# Patient Record
Sex: Male | Born: 1935
Health system: Southern US, Community
[De-identification: ages and names within clinical notes are randomized; demographics above are authoritative.]

## PROBLEM LIST (undated history)

## (undated) DIAGNOSIS — I81 Portal vein thrombosis: Secondary | ICD-10-CM

## (undated) DIAGNOSIS — K635 Polyp of colon: Secondary | ICD-10-CM

## (undated) DIAGNOSIS — J45909 Unspecified asthma, uncomplicated: Secondary | ICD-10-CM

## (undated) DIAGNOSIS — D6851 Activated protein C resistance: Secondary | ICD-10-CM

## (undated) DIAGNOSIS — F419 Anxiety disorder, unspecified: Secondary | ICD-10-CM

## (undated) DIAGNOSIS — K2270 Barrett's esophagus without dysplasia: Secondary | ICD-10-CM

## (undated) DIAGNOSIS — K529 Noninfective gastroenteritis and colitis, unspecified: Secondary | ICD-10-CM

## (undated) DIAGNOSIS — K5790 Diverticulosis of intestine, part unspecified, without perforation or abscess without bleeding: Secondary | ICD-10-CM

## (undated) DIAGNOSIS — G250 Essential tremor: Secondary | ICD-10-CM

## (undated) DIAGNOSIS — D682 Hereditary deficiency of other clotting factors: Secondary | ICD-10-CM

## (undated) DIAGNOSIS — M5430 Sciatica, unspecified side: Secondary | ICD-10-CM

## (undated) DIAGNOSIS — K449 Diaphragmatic hernia without obstruction or gangrene: Secondary | ICD-10-CM

## (undated) DIAGNOSIS — B029 Zoster without complications: Secondary | ICD-10-CM

## (undated) DIAGNOSIS — I82409 Acute embolism and thrombosis of unspecified deep veins of unspecified lower extremity: Secondary | ICD-10-CM

## (undated) DIAGNOSIS — G4700 Insomnia, unspecified: Secondary | ICD-10-CM

## (undated) DIAGNOSIS — K429 Umbilical hernia without obstruction or gangrene: Secondary | ICD-10-CM

## (undated) DIAGNOSIS — K219 Gastro-esophageal reflux disease without esophagitis: Secondary | ICD-10-CM

## (undated) DIAGNOSIS — I10 Essential (primary) hypertension: Secondary | ICD-10-CM

## (undated) DIAGNOSIS — M1990 Unspecified osteoarthritis, unspecified site: Secondary | ICD-10-CM

## (undated) DIAGNOSIS — M810 Age-related osteoporosis without current pathological fracture: Secondary | ICD-10-CM

## (undated) DIAGNOSIS — D689 Coagulation defect, unspecified: Secondary | ICD-10-CM

## (undated) DIAGNOSIS — R809 Proteinuria, unspecified: Secondary | ICD-10-CM

## (undated) DIAGNOSIS — Z86010 Personal history of colonic polyps: Secondary | ICD-10-CM

## (undated) DIAGNOSIS — E785 Hyperlipidemia, unspecified: Secondary | ICD-10-CM

## (undated) DIAGNOSIS — Z8719 Personal history of other diseases of the digestive system: Secondary | ICD-10-CM

## (undated) DIAGNOSIS — F329 Major depressive disorder, single episode, unspecified: Secondary | ICD-10-CM

## (undated) HISTORY — DX: Personal history of other diseases of the digestive system: Z87.19

## (undated) HISTORY — DX: Acute embolism and thrombosis of unspecified deep veins of unspecified lower extremity: I82.409

## (undated) HISTORY — DX: Umbilical hernia without obstruction or gangrene: K42.9

## (undated) HISTORY — DX: Gilbert syndrome: E80.4

## (undated) HISTORY — DX: Proteinuria, unspecified: R80.9

## (undated) HISTORY — DX: Personal history of colonic polyps: Z86.010

## (undated) HISTORY — DX: Hereditary deficiency of other clotting factors: D68.2

## (undated) HISTORY — PX: COLONOSCOPY: SHX174

## (undated) HISTORY — DX: Anxiety disorder, unspecified: F41.9

## (undated) HISTORY — DX: Unspecified asthma, uncomplicated: J45.909

## (undated) HISTORY — PX: TONSILLECTOMY: SHX5217

## (undated) HISTORY — DX: Barrett's esophagus without dysplasia: K22.70

## (undated) HISTORY — DX: Activated protein C resistance: D68.51

## (undated) HISTORY — DX: Insomnia, unspecified: G47.00

## (undated) HISTORY — DX: Diaphragmatic hernia without obstruction or gangrene: K44.9

## (undated) HISTORY — DX: Age-related osteoporosis without current pathological fracture: M81.0

## (undated) HISTORY — DX: Noninfective gastroenteritis and colitis, unspecified: K52.9

## (undated) HISTORY — DX: Polyp of colon: K63.5

## (undated) HISTORY — DX: Zoster without complications: B02.9

## (undated) HISTORY — DX: Portal vein thrombosis: I81

## (undated) HISTORY — DX: Diverticulosis of intestine, part unspecified, without perforation or abscess without bleeding: K57.90

## (undated) HISTORY — PX: KNEE ARTHROSCOPY: SUR90

## (undated) HISTORY — DX: Hyperlipidemia, unspecified: E78.5

## (undated) HISTORY — DX: Essential (primary) hypertension: I10

## (undated) HISTORY — DX: Major depressive disorder, single episode, unspecified: F32.9

## (undated) HISTORY — PX: CYSTOSCOPY: SUR368

## (undated) HISTORY — DX: Sciatica, unspecified side: M54.30

## (undated) HISTORY — DX: Essential tremor: G25.0

## (undated) HISTORY — DX: Gastro-esophageal reflux disease without esophagitis: K21.9

## (undated) HISTORY — DX: Unspecified osteoarthritis, unspecified site: M19.90

## (undated) HISTORY — PX: TRANSURETHRAL RESECTION OF PROSTATE: SHX73

## (undated) HISTORY — DX: Coagulation defect, unspecified: D68.9

---

## 1999-04-01 ENCOUNTER — Ambulatory Visit (HOSPITAL_BASED_OUTPATIENT_CLINIC_OR_DEPARTMENT_OTHER): Admission: RE | Admit: 1999-04-01 | Discharge: 1999-04-01 | Payer: Self-pay | Admitting: Urology

## 2004-08-18 DIAGNOSIS — F32A Depression, unspecified: Secondary | ICD-10-CM

## 2004-08-18 HISTORY — DX: Depression, unspecified: F32.A

## 2004-08-23 ENCOUNTER — Ambulatory Visit: Payer: Self-pay | Admitting: Internal Medicine

## 2004-10-04 ENCOUNTER — Ambulatory Visit: Payer: Self-pay | Admitting: Internal Medicine

## 2005-01-16 ENCOUNTER — Ambulatory Visit: Payer: Self-pay | Admitting: Internal Medicine

## 2005-01-23 ENCOUNTER — Ambulatory Visit: Payer: Self-pay

## 2005-01-27 ENCOUNTER — Ambulatory Visit: Payer: Self-pay | Admitting: Internal Medicine

## 2005-03-10 ENCOUNTER — Encounter: Admission: RE | Admit: 2005-03-10 | Discharge: 2005-06-08 | Payer: Self-pay | Admitting: Internal Medicine

## 2005-03-25 ENCOUNTER — Ambulatory Visit: Payer: Self-pay | Admitting: Internal Medicine

## 2005-03-26 ENCOUNTER — Inpatient Hospital Stay (HOSPITAL_COMMUNITY): Admission: EM | Admit: 2005-03-26 | Discharge: 2005-03-28 | Payer: Self-pay | Admitting: Emergency Medicine

## 2005-03-28 ENCOUNTER — Ambulatory Visit: Payer: Self-pay | Admitting: Psychiatry

## 2005-03-28 ENCOUNTER — Inpatient Hospital Stay (HOSPITAL_COMMUNITY): Admission: RE | Admit: 2005-03-28 | Discharge: 2005-03-31 | Payer: Self-pay | Admitting: Psychiatry

## 2005-04-16 ENCOUNTER — Ambulatory Visit: Payer: Self-pay | Admitting: Internal Medicine

## 2005-09-26 ENCOUNTER — Ambulatory Visit: Payer: Self-pay | Admitting: Internal Medicine

## 2005-10-01 ENCOUNTER — Ambulatory Visit: Payer: Self-pay | Admitting: Internal Medicine

## 2006-01-16 ENCOUNTER — Ambulatory Visit: Payer: Self-pay | Admitting: Internal Medicine

## 2006-08-28 ENCOUNTER — Ambulatory Visit (HOSPITAL_COMMUNITY): Admission: RE | Admit: 2006-08-28 | Discharge: 2006-08-28 | Payer: Self-pay | Admitting: Orthopedic Surgery

## 2007-04-12 ENCOUNTER — Encounter (INDEPENDENT_AMBULATORY_CARE_PROVIDER_SITE_OTHER): Payer: Self-pay | Admitting: *Deleted

## 2007-04-21 ENCOUNTER — Ambulatory Visit: Payer: Self-pay | Admitting: Internal Medicine

## 2007-04-21 LAB — CONVERTED CEMR LAB
HDL goal, serum: 40 mg/dL
LDL Goal: 160 mg/dL

## 2007-04-30 ENCOUNTER — Ambulatory Visit: Payer: Self-pay | Admitting: Gastroenterology

## 2007-05-28 ENCOUNTER — Encounter: Payer: Self-pay | Admitting: Gastroenterology

## 2007-05-28 ENCOUNTER — Ambulatory Visit: Payer: Self-pay | Admitting: Gastroenterology

## 2007-05-28 ENCOUNTER — Encounter: Payer: Self-pay | Admitting: Internal Medicine

## 2007-05-28 DIAGNOSIS — Z860100 Personal history of colon polyps, unspecified: Secondary | ICD-10-CM

## 2007-05-28 HISTORY — DX: Personal history of colon polyps, unspecified: Z86.0100

## 2007-05-28 HISTORY — DX: Personal history of colonic polyps: Z86.010

## 2007-06-28 ENCOUNTER — Encounter: Payer: Self-pay | Admitting: Internal Medicine

## 2007-09-14 ENCOUNTER — Encounter (INDEPENDENT_AMBULATORY_CARE_PROVIDER_SITE_OTHER): Payer: Self-pay | Admitting: *Deleted

## 2008-08-18 DIAGNOSIS — B029 Zoster without complications: Secondary | ICD-10-CM

## 2008-08-18 DIAGNOSIS — Z8719 Personal history of other diseases of the digestive system: Secondary | ICD-10-CM

## 2008-08-18 HISTORY — DX: Zoster without complications: B02.9

## 2008-08-18 HISTORY — DX: Personal history of other diseases of the digestive system: Z87.19

## 2008-09-19 ENCOUNTER — Ambulatory Visit: Payer: Self-pay | Admitting: Internal Medicine

## 2008-10-03 ENCOUNTER — Ambulatory Visit: Payer: Self-pay | Admitting: Internal Medicine

## 2008-10-03 DIAGNOSIS — Z86718 Personal history of other venous thrombosis and embolism: Secondary | ICD-10-CM | POA: Insufficient documentation

## 2008-10-03 DIAGNOSIS — I10 Essential (primary) hypertension: Secondary | ICD-10-CM

## 2008-10-03 DIAGNOSIS — R7301 Impaired fasting glucose: Secondary | ICD-10-CM | POA: Insufficient documentation

## 2008-10-03 DIAGNOSIS — E785 Hyperlipidemia, unspecified: Secondary | ICD-10-CM | POA: Insufficient documentation

## 2008-10-03 DIAGNOSIS — K219 Gastro-esophageal reflux disease without esophagitis: Secondary | ICD-10-CM

## 2008-10-04 ENCOUNTER — Ambulatory Visit: Payer: Self-pay | Admitting: Internal Medicine

## 2008-10-09 ENCOUNTER — Encounter (INDEPENDENT_AMBULATORY_CARE_PROVIDER_SITE_OTHER): Payer: Self-pay | Admitting: *Deleted

## 2009-02-06 ENCOUNTER — Ambulatory Visit: Payer: Self-pay | Admitting: Internal Medicine

## 2009-02-15 ENCOUNTER — Encounter (INDEPENDENT_AMBULATORY_CARE_PROVIDER_SITE_OTHER): Payer: Self-pay | Admitting: *Deleted

## 2009-02-15 LAB — CONVERTED CEMR LAB
ALT: 95 units/L — ABNORMAL HIGH (ref 0–53)
AST: 64 units/L — ABNORMAL HIGH (ref 0–37)
Albumin: 3.8 g/dL (ref 3.5–5.2)
Alkaline Phosphatase: 44 units/L (ref 39–117)
Basophils Absolute: 0 10*3/uL (ref 0.0–0.1)
Basophils Relative: 1 % (ref 0.0–3.0)
Bilirubin, Direct: 0.3 mg/dL (ref 0.0–0.3)
Cholesterol: 203 mg/dL — ABNORMAL HIGH (ref 0–200)
Eosinophils Absolute: 0.1 10*3/uL (ref 0.0–0.7)
MCHC: 35.2 g/dL (ref 30.0–36.0)
MCV: 100.6 fL — ABNORMAL HIGH (ref 78.0–100.0)
Monocytes Absolute: 0.4 10*3/uL (ref 0.1–1.0)
Neutrophils Relative %: 42.8 % — ABNORMAL LOW (ref 43.0–77.0)
Platelets: 91 10*3/uL — ABNORMAL LOW (ref 150.0–400.0)
RBC: 4.3 M/uL (ref 4.22–5.81)
RDW: 12 % (ref 11.5–14.6)
Total Protein: 7.2 g/dL (ref 6.0–8.3)

## 2009-04-09 ENCOUNTER — Ambulatory Visit: Payer: Self-pay | Admitting: Internal Medicine

## 2009-04-09 ENCOUNTER — Encounter: Payer: Self-pay | Admitting: Internal Medicine

## 2009-04-09 ENCOUNTER — Inpatient Hospital Stay (HOSPITAL_COMMUNITY): Admission: AD | Admit: 2009-04-09 | Discharge: 2009-04-13 | Payer: Self-pay | Admitting: Internal Medicine

## 2009-04-09 DIAGNOSIS — R1084 Generalized abdominal pain: Secondary | ICD-10-CM | POA: Insufficient documentation

## 2009-04-12 ENCOUNTER — Ambulatory Visit: Payer: Self-pay | Admitting: Oncology

## 2009-04-12 ENCOUNTER — Encounter: Payer: Self-pay | Admitting: Internal Medicine

## 2009-04-13 ENCOUNTER — Telehealth (INDEPENDENT_AMBULATORY_CARE_PROVIDER_SITE_OTHER): Payer: Self-pay | Admitting: *Deleted

## 2009-04-16 ENCOUNTER — Ambulatory Visit: Payer: Self-pay | Admitting: Internal Medicine

## 2009-04-16 DIAGNOSIS — D689 Coagulation defect, unspecified: Secondary | ICD-10-CM

## 2009-04-16 LAB — CONVERTED CEMR LAB: INR: 2.8

## 2009-04-19 ENCOUNTER — Ambulatory Visit: Payer: Self-pay | Admitting: Oncology

## 2009-04-24 ENCOUNTER — Ambulatory Visit: Payer: Self-pay | Admitting: Internal Medicine

## 2009-04-24 DIAGNOSIS — G250 Essential tremor: Secondary | ICD-10-CM

## 2009-04-24 LAB — CONVERTED CEMR LAB: INR: 2.3

## 2009-04-30 ENCOUNTER — Ambulatory Visit: Payer: Self-pay | Admitting: Internal Medicine

## 2009-05-02 ENCOUNTER — Encounter: Payer: Self-pay | Admitting: Internal Medicine

## 2009-05-02 ENCOUNTER — Encounter: Payer: Self-pay | Admitting: Gastroenterology

## 2009-05-02 LAB — CBC WITH DIFFERENTIAL/PLATELET
Basophils Absolute: 0 10*3/uL (ref 0.0–0.1)
Eosinophils Absolute: 0.1 10*3/uL (ref 0.0–0.5)
HCT: 44.4 % (ref 38.4–49.9)
HGB: 15.6 g/dL (ref 13.0–17.1)
MCV: 95.1 fL (ref 79.3–98.0)
MONO%: 8.7 % (ref 0.0–14.0)
NEUT#: 3.5 10*3/uL (ref 1.5–6.5)
NEUT%: 54.2 % (ref 39.0–75.0)
RDW: 12.9 % (ref 11.0–14.6)

## 2009-05-02 LAB — PROTHROMBIN TIME: INR: 1.8 — ABNORMAL HIGH (ref 0.0–1.5)

## 2009-05-03 LAB — D-DIMER, QUANTITATIVE: D-Dimer, Quant: 0.68 ug/mL-FEU — ABNORMAL HIGH (ref 0.00–0.48)

## 2009-05-03 LAB — FACTOR 8 ASSAY: Coagulation Factor VIII: 200 % — ABNORMAL HIGH (ref 73–140)

## 2009-05-07 ENCOUNTER — Ambulatory Visit: Payer: Self-pay | Admitting: Internal Medicine

## 2009-05-07 LAB — CONVERTED CEMR LAB: INR: 3.5

## 2009-05-09 ENCOUNTER — Ambulatory Visit: Payer: Self-pay | Admitting: Internal Medicine

## 2009-05-10 ENCOUNTER — Ambulatory Visit: Payer: Self-pay | Admitting: Internal Medicine

## 2009-05-10 DIAGNOSIS — B029 Zoster without complications: Secondary | ICD-10-CM | POA: Insufficient documentation

## 2009-05-14 ENCOUNTER — Ambulatory Visit: Payer: Self-pay | Admitting: Internal Medicine

## 2009-05-14 LAB — CONVERTED CEMR LAB
INR: 4.4
Prothrombin Time: 25.5 s

## 2009-05-15 ENCOUNTER — Ambulatory Visit: Payer: Self-pay | Admitting: Internal Medicine

## 2009-05-15 LAB — CONVERTED CEMR LAB: INR: 3.1

## 2009-05-16 ENCOUNTER — Telehealth: Payer: Self-pay | Admitting: Internal Medicine

## 2009-05-18 ENCOUNTER — Ambulatory Visit: Payer: Self-pay | Admitting: Internal Medicine

## 2009-05-25 ENCOUNTER — Ambulatory Visit: Payer: Self-pay | Admitting: Internal Medicine

## 2009-06-11 ENCOUNTER — Ambulatory Visit: Payer: Self-pay | Admitting: Internal Medicine

## 2009-07-02 ENCOUNTER — Ambulatory Visit: Payer: Self-pay | Admitting: Internal Medicine

## 2009-07-02 LAB — CONVERTED CEMR LAB: INR: 1.9

## 2009-07-23 ENCOUNTER — Ambulatory Visit: Payer: Self-pay | Admitting: Internal Medicine

## 2009-07-23 LAB — CONVERTED CEMR LAB: INR: 1.7

## 2009-08-02 ENCOUNTER — Encounter: Payer: Self-pay | Admitting: Internal Medicine

## 2009-08-06 ENCOUNTER — Telehealth (INDEPENDENT_AMBULATORY_CARE_PROVIDER_SITE_OTHER): Payer: Self-pay | Admitting: *Deleted

## 2009-08-06 ENCOUNTER — Ambulatory Visit: Payer: Self-pay | Admitting: Internal Medicine

## 2009-08-06 LAB — CONVERTED CEMR LAB: INR: 3.5 — ABNORMAL HIGH (ref 0.8–1.0)

## 2009-08-20 ENCOUNTER — Ambulatory Visit: Payer: Self-pay | Admitting: Internal Medicine

## 2009-08-20 LAB — CONVERTED CEMR LAB: INR: 1.4

## 2009-08-27 ENCOUNTER — Ambulatory Visit: Payer: Self-pay | Admitting: Internal Medicine

## 2009-08-27 LAB — CONVERTED CEMR LAB: Prothrombin Time: 22 s

## 2009-09-10 ENCOUNTER — Ambulatory Visit: Payer: Self-pay | Admitting: Internal Medicine

## 2009-10-01 ENCOUNTER — Ambulatory Visit: Payer: Self-pay | Admitting: Internal Medicine

## 2009-10-01 ENCOUNTER — Telehealth (INDEPENDENT_AMBULATORY_CARE_PROVIDER_SITE_OTHER): Payer: Self-pay | Admitting: *Deleted

## 2009-10-25 ENCOUNTER — Telehealth (INDEPENDENT_AMBULATORY_CARE_PROVIDER_SITE_OTHER): Payer: Self-pay | Admitting: *Deleted

## 2009-10-25 ENCOUNTER — Ambulatory Visit: Payer: Self-pay | Admitting: Internal Medicine

## 2009-10-25 DIAGNOSIS — G479 Sleep disorder, unspecified: Secondary | ICD-10-CM | POA: Insufficient documentation

## 2009-10-25 DIAGNOSIS — D682 Hereditary deficiency of other clotting factors: Secondary | ICD-10-CM | POA: Insufficient documentation

## 2009-10-25 LAB — CONVERTED CEMR LAB: INR: 2.5 — ABNORMAL HIGH (ref 0.8–1.0)

## 2009-10-26 ENCOUNTER — Ambulatory Visit: Payer: Self-pay | Admitting: Oncology

## 2009-11-19 ENCOUNTER — Encounter: Payer: Self-pay | Admitting: Internal Medicine

## 2009-11-19 LAB — CBC WITH DIFFERENTIAL/PLATELET
Eosinophils Absolute: 0.1 10*3/uL (ref 0.0–0.5)
HCT: 43.5 % (ref 38.4–49.9)
LYMPH%: 45.1 % (ref 14.0–49.0)
MCV: 97.7 fL (ref 79.3–98.0)
MONO#: 0.6 10*3/uL (ref 0.1–0.9)
MONO%: 12.5 % (ref 0.0–14.0)
NEUT#: 2 10*3/uL (ref 1.5–6.5)
NEUT%: 39.6 % (ref 39.0–75.0)
Platelets: 127 10*3/uL — ABNORMAL LOW (ref 140–400)
WBC: 5.1 10*3/uL (ref 4.0–10.3)

## 2009-11-19 LAB — D-DIMER, QUANTITATIVE: D-Dimer, Quant: 0.41 ug/mL-FEU (ref 0.00–0.48)

## 2009-11-21 ENCOUNTER — Telehealth (INDEPENDENT_AMBULATORY_CARE_PROVIDER_SITE_OTHER): Payer: Self-pay | Admitting: *Deleted

## 2010-07-17 ENCOUNTER — Ambulatory Visit: Payer: Self-pay | Admitting: Internal Medicine

## 2010-07-17 DIAGNOSIS — R0609 Other forms of dyspnea: Secondary | ICD-10-CM

## 2010-07-17 DIAGNOSIS — R918 Other nonspecific abnormal finding of lung field: Secondary | ICD-10-CM | POA: Insufficient documentation

## 2010-07-17 DIAGNOSIS — R198 Other specified symptoms and signs involving the digestive system and abdomen: Secondary | ICD-10-CM

## 2010-07-17 DIAGNOSIS — R0989 Other specified symptoms and signs involving the circulatory and respiratory systems: Secondary | ICD-10-CM | POA: Insufficient documentation

## 2010-07-17 LAB — CONVERTED CEMR LAB: INR: 1.4

## 2010-07-19 ENCOUNTER — Ambulatory Visit: Payer: Self-pay | Admitting: Internal Medicine

## 2010-07-24 ENCOUNTER — Ambulatory Visit: Payer: Self-pay | Admitting: Internal Medicine

## 2010-07-24 LAB — CONVERTED CEMR LAB: INR: 2.2

## 2010-08-14 ENCOUNTER — Ambulatory Visit: Payer: Self-pay | Admitting: Internal Medicine

## 2010-08-14 LAB — CONVERTED CEMR LAB: INR: 4.1

## 2010-08-16 ENCOUNTER — Encounter: Payer: Self-pay | Admitting: Internal Medicine

## 2010-08-16 ENCOUNTER — Ambulatory Visit
Admission: RE | Admit: 2010-08-16 | Discharge: 2010-08-16 | Payer: Self-pay | Source: Home / Self Care | Attending: Cardiovascular Disease | Admitting: Cardiovascular Disease

## 2010-08-16 LAB — CONVERTED CEMR LAB: POC INR: 3.4

## 2010-08-20 ENCOUNTER — Ambulatory Visit
Admission: RE | Admit: 2010-08-20 | Discharge: 2010-08-20 | Payer: Self-pay | Source: Home / Self Care | Attending: Orthopedic Surgery | Admitting: Orthopedic Surgery

## 2010-08-23 ENCOUNTER — Ambulatory Visit
Admission: RE | Admit: 2010-08-23 | Discharge: 2010-08-23 | Payer: Self-pay | Source: Home / Self Care | Attending: Internal Medicine | Admitting: Internal Medicine

## 2010-08-26 ENCOUNTER — Ambulatory Visit
Admission: RE | Admit: 2010-08-26 | Discharge: 2010-08-26 | Payer: Self-pay | Source: Home / Self Care | Attending: Internal Medicine | Admitting: Internal Medicine

## 2010-08-26 ENCOUNTER — Encounter
Admission: RE | Admit: 2010-08-26 | Discharge: 2010-08-26 | Payer: Self-pay | Source: Home / Self Care | Attending: Internal Medicine | Admitting: Internal Medicine

## 2010-08-26 LAB — CONVERTED CEMR LAB: INR: 2.8

## 2010-08-30 ENCOUNTER — Ambulatory Visit
Admission: RE | Admit: 2010-08-30 | Discharge: 2010-08-30 | Payer: Self-pay | Source: Home / Self Care | Attending: Internal Medicine | Admitting: Internal Medicine

## 2010-08-30 LAB — CONVERTED CEMR LAB: INR: 2.9

## 2010-09-02 ENCOUNTER — Encounter: Payer: Self-pay | Admitting: Gastroenterology

## 2010-09-06 ENCOUNTER — Ambulatory Visit
Admission: RE | Admit: 2010-09-06 | Discharge: 2010-09-06 | Payer: Self-pay | Source: Home / Self Care | Attending: Internal Medicine | Admitting: Internal Medicine

## 2010-09-15 LAB — CONVERTED CEMR LAB
Albumin: 4 g/dL (ref 3.5–5.2)
BUN: 10 mg/dL (ref 6–23)
Basophils Absolute: 0 10*3/uL (ref 0.0–0.1)
Basophils Relative: 0.3 % (ref 0.0–3.0)
Chloride: 103 meq/L (ref 96–112)
Cholesterol: 229 mg/dL (ref 0–200)
Creatinine, Ser: 0.8 mg/dL (ref 0.4–1.5)
Direct LDL: 133.4 mg/dL
Eosinophils Absolute: 0.1 10*3/uL (ref 0.0–0.7)
Eosinophils Relative: 2.3 % (ref 0.0–5.0)
GFR calc Af Amer: 123 mL/min
GFR calc non Af Amer: 101 mL/min
HCT: 44.5 % (ref 39.0–52.0)
HDL: 33.3 mg/dL — ABNORMAL LOW (ref >39.0)
Hemoglobin, Urine: NEGATIVE
Hgb A1c MFr Bld: 5.1 % (ref 4.6–6.0)
Ketones, ur: NEGATIVE mg/dL
MCHC: 33.7 g/dL (ref 30.0–36.0)
MCV: 100.3 fL — ABNORMAL HIGH (ref 78.0–100.0)
Monocytes Absolute: 0.4 10*3/uL (ref 0.1–1.0)
Neutrophils Relative %: 48.1 % (ref 43.0–77.0)
PSA: 1.24 ng/mL (ref 0.10–4.00)
Platelets: 100 10*3/uL — ABNORMAL LOW (ref 150–400)
TSH: 1.52 microintl units/mL (ref 0.35–5.50)
Total Bilirubin: 1.8 mg/dL — ABNORMAL HIGH (ref 0.3–1.2)
Total Protein, Urine: NEGATIVE mg/dL
Triglycerides: 272 mg/dL (ref 0–149)
Urine Glucose: NEGATIVE mg/dL
Urobilinogen, UA: 0.2 (ref 0.0–1.0)
VLDL: 54 mg/dL — ABNORMAL HIGH (ref 0–40)
WBC: 4.2 10*3/uL — ABNORMAL LOW (ref 4.5–10.5)

## 2010-09-17 NOTE — Assessment & Plan Note (Signed)
Summary: stomach problem and pt check//ph   Vital Signs:  Patient profile:   76 year old male Weight:      223.8 pounds BMI:     32.69 Temp:     98.9 degrees F oral Pulse rate:   72 / minute Resp:     15 per minute BP sitting:   126 / 74  (left arm) Cuff size:   large  Vitals Entered By: Shonna Chock CMA (July 17, 2010 3:20 PM) CC: Stomach problems and PT check, URI symptoms, COPD follow-up   CC:  Stomach problems and PT check, URI symptoms, and COPD follow-up.  History of Present Illness:      This is a 76 year old man  is S/P 10 days of Cefdinir for  RTI symptoms.  The patient reported  purulent nasal discharge and dry cough, but denied  nasal congestion and sore throat.  Associated symptoms included  dyspnea and wheezing.  The patient denied  fever or frontal headache.  The patient denied  the following risk factors for Strep sinusitis: unilateral facial pain, tooth pain, and tender adenopathy.  The CXray taken 11/19 revealed "a spot". He continues to have some dyspnea w/o sputum or wheezing.   The patient reports chest tightness with the dyspnea, but denies nocturnal awakening.  The patient reports no limitation in activities.  Current treatment includes MDI without spacer Rxed @ [facility] by Dr Alwyn Ren. PMH of childhood asthma.Remotely  he smoked  for > 40 years, up to 1.5-2 ppd. He quit in 1996.  Current Medications (verified): 1)  Propranolol Hcl 40 Mg Tabs (Propranolol Hcl) .Marland Kitchen.. 1 Two Times A Day 2)  Losartan Potassium-Hctz 100-12.5 Mg Tabs (Losartan Potassium-Hctz) .Marland Kitchen.. 1 By Mouth Once Daily **appointment Due** 3)  Fish Oil Double Strength 1200 Mg Caps (Omega-3 Fatty Acids) .... 2400mg  Daily 4)  Vitd 1000mg  5)  Folic Acid 400mg  6)  Prilosec 20mg  Qd .Marland Kitchen.. 3 X Weekly 7)  Celebrex 200 Mg  Caps (Celecoxib) .Marland Kitchen.. 1 By Mouth As Needed 8)  Gabapentin 300 Mg  Tabs (Gabapentin) .Marland Kitchen.. 1 Q 8 As Needed 9)  Warfarin Sodium 5 Mg Tabs (Warfarin Sodium) .... As Directed 10)  Tylenol Arthritis Pain  650 Mg Cr-Tabs (Acetaminophen) .... As Needed  Allergies: 1)  ! * Lotrel  Review of Systems Resp:  Denies chest pain with inspiration and coughing up blood. GI:  Loose stool X 3 months w/o diarrhea. Allergy:  Denies itching eyes and sneezing.  Physical Exam  General:  well-nourished,in no acute distress; alert,appropriate and cooperative throughout examination Ears:  External ear exam shows no significant lesions or deformities.  Otoscopic examination reveals clear canals, tympanic membranes are intact bilaterally  but scarred Nose:  External nasal examination shows no deformity or inflammation. Nasal mucosa are pink and moist without lesions or exudates.Septum dislocated Mouth:  Oral mucosa and oropharynx without lesions or exudates.  Teeth in good repair. Mild pharyngeal erythema.   Lungs:  Normal respiratory effort, chest expands symmetrically. Lungs are clear to auscultation, no crackles or wheezes. Heart:  Normal rate and regular rhythm. S1 and S2 normal without gallop, murmur, click, rub . S 4 Cervical Nodes:  No lymphadenopathy noted Axillary Nodes:  No palpable lymphadenopathy   Impression & Recommendations:  Problem # 1:  DYSPNEA/SHORTNESS OF BREATH (ICD-786.09)  probable post infectious RAD component His updated medication list for this problem includes:    Propranolol Hcl 40 Mg Tabs (Propranolol hcl) .Marland Kitchen... 1 two times a day  Losartan Potassium-hctz 100-12.5 Mg Tabs (Losartan potassium-hctz) .Marland Kitchen... 1 by mouth once daily **appointment due**    Symbicort 160-4.5 Mcg/act Aero (Budesonide-formoterol fumarate) .Marland Kitchen... 1-2 puffs every 12 hrs ; gargle & spit after use  Orders: T-2 View CXR (71020TC)  Problem # 2:  OTHER NONSPECIFIC ABNORMAL FINDING OF LUNG FIELD (ICD-793.19)  "spot " found with acute RTI 11/19  Orders: T-2 View CXR (71020TC)  Problem # 3:  OTHER SYMPTOMS INVOLVING DIGESTIVE SYSTEM OTHER (ICD-787.99) loose stool  Problem # 4:  FACTOR V DEFICIENCY  (ICD-286.3)  Complete Medication List: 1)  Propranolol Hcl 40 Mg Tabs (Propranolol hcl) .Marland Kitchen.. 1 two times a day 2)  Losartan Potassium-hctz 100-12.5 Mg Tabs (Losartan potassium-hctz) .Marland Kitchen.. 1 by mouth once daily **appointment due** 3)  Fish Oil Double Strength 1200 Mg Caps (Omega-3 fatty acids) .... 2400mg  daily 4)  Vitd 1000mg   5)  Folic Acid 400mg   6)  Prilosec 20mg  Qd  .Marland Kitchen.. 3 x weekly 7)  Celebrex 200 Mg Caps (Celecoxib) .Marland Kitchen.. 1 by mouth as needed 8)  Gabapentin 300 Mg Tabs (Gabapentin) .Marland Kitchen.. 1 q 8 as needed 9)  Warfarin Sodium 5 Mg Tabs (Warfarin sodium) .... As directed 10)  Tylenol Arthritis Pain 650 Mg Cr-tabs (Acetaminophen) .... As needed 11)  Symbicort 160-4.5 Mcg/act Aero (Budesonide-formoterol fumarate) .Marland Kitchen.. 1-2 puffs every 12 hrs ; gargle & spit after use  Other Orders: Protime (29528UX)  Patient Instructions: 1)  Hold Inderal until respiratory symptoms have resolved. Warfarin 5 mg  : 1&1/2 pills (7.5 mg)  today & 12/01 then 5 mg once daily . Check PT/INR in 7 days .Drink as much NON dairy  fluid as you can tolerate for the next few days. Aign once daily until bowels are normal. Prescriptions: SYMBICORT 160-4.5 MCG/ACT AERO (BUDESONIDE-FORMOTEROL FUMARATE) 1-2 puffs every 12 hrs ; gargle & spit after use  #1 x 5   Entered and Authorized by:   Marga Melnick MD   Signed by:   Marga Melnick MD on 07/17/2010   Method used:   Print then Give to Patient   RxID:   [id number]    Orders Added: 1)  Protime [[id number]] 2)  Est. Patient Level IV [95638] 3)  T-2 View CXR [71020TC]     ANTICOAGULATION RECORD PREVIOUS REGIMEN & LAB RESULTS   Previous INR:  2.5 ratio on  10/25/2009 Previous Coumadin Dose(mg):  2.5mg  M/F, 5mg  all other days on  09/10/2009 Previous Regimen:  2.5mg  M/W/F, 5mg  all other days on  09/10/2009  NEW REGIMEN & LAB RESULTS Current INR: 1.4 Current Coumadin Dose(mg): 5mg  daily except 2.5mg  on M/W/F Regimen: 2.5mg  M/W/F, 5mg  all other days  (no  change)  Provider: Hopper,William Other Comments: Last Pt was March  MEDICATIONS PROPRANOLOL HCL 40 MG TABS (PROPRANOLOL HCL) 1 two times a day LOSARTAN POTASSIUM-HCTZ 100-12.5 MG TABS (LOSARTAN POTASSIUM-HCTZ) 1 by mouth once daily **APPOINTMENT DUE** FISH OIL DOUBLE STRENGTH 1200 MG CAPS (OMEGA-3 FATTY ACIDS) 2400MG  DAILY * VITD 1000MG   * FOLIC ACID 400MG   * PRILOSEC 20MG  QD 3 X WEEKLY CELEBREX 200 MG  CAPS (CELECOXIB) 1 by mouth as needed GABAPENTIN 300 MG  TABS (GABAPENTIN) 1 q 8 as needed WARFARIN SODIUM 5 MG TABS (WARFARIN SODIUM) AS DIRECTED TYLENOL ARTHRITIS PAIN 650 MG CR-TABS (ACETAMINOPHEN) as needed SYMBICORT 160-4.5 MCG/ACT AERO (BUDESONIDE-FORMOTEROL FUMARATE) 1-2 puffs every 12 hrs ; gargle & spit after use   Anticoagulation Visit Questionnaire      Coumadin dose missed/changed:  Yes      Abnormal  Bleeding Symptoms:  No   Any diet changes including alcohol intake, vegetables or greens since the last visit:  Yes      Diet Comments:Patient changed diet  around holiday (Thanksgiving)  Any illnesses or hospitalizations since the last visit:  No Any signs of clotting since the last visit (including chest discomfort, dizziness, shortness of breath, arm tingling, slurred speech, swelling or redness in leg):  No

## 2010-09-17 NOTE — Assessment & Plan Note (Signed)
Summary: PT CHECK//PH   Nurse Visit  CC: PT check./kb   Allergies: 1)  ! * Lotrel Laboratory Results   Blood Tests      INR: 2.2   (Normal Range: 0.88-1.12   Therap INR: 2.0-3.5)    Orders Added: 1)  Est. Patient Level I [16109] 2)  Protime [[id number]]   ANTICOAGULATION RECORD PREVIOUS REGIMEN & LAB RESULTS   Previous INR:  1.4 on  07/17/2010 Previous Coumadin Dose(mg):  5mg  daily except 2.5mg  on M/W/F on  07/17/2010 Previous Regimen:  2.5mg  M/W/F, 5mg  all other days on  09/10/2009  NEW REGIMEN & LAB RESULTS Anticoag. Dx: Deep venous thrombosis Current INR: 2.2 Regimen: 5mg  qd except 2.5mg  on Wed.  Provider: Alwyn Ren Repeat testing in: 3 weeks Other Comments: The patient has 5mg  tabs at home and has been taking 5mg  once daily except 7.5mg  on Wed & Thurs. Per MD he was advised to start 5mg  once daily except 2.5mg  on Wed. and re-check in 3 weeks. Lucious Groves CMA  July 24, 2010 9:45 AM   Dose has been reviewed with patient or caretaker during this visit. Reviewed by: Lucious Groves  Anticoagulation Visit Questionnaire Coumadin dose missed/changed:  No Abnormal Bleeding Symptoms:  No  Any diet changes including alcohol intake, vegetables or greens since the last visit:  No Any illnesses or hospitalizations since the last visit:  No Any signs of clotting since the last visit (including chest discomfort, dizziness, shortness of breath, arm tingling, slurred speech, swelling or redness in leg):  No  MEDICATIONS PROPRANOLOL HCL 40 MG TABS (PROPRANOLOL HCL) 1 two times a day LOSARTAN POTASSIUM-HCTZ 100-12.5 MG TABS (LOSARTAN POTASSIUM-HCTZ) 1 by mouth once daily FISH OIL DOUBLE STRENGTH 1200 MG CAPS (OMEGA-3 FATTY ACIDS) 2400MG  DAILY * VITD 1000MG   * FOLIC ACID 400MG   * PRILOSEC 20MG  QD 3 X WEEKLY CELEBREX 200 MG  CAPS (CELECOXIB) 1 by mouth as needed GABAPENTIN 300 MG  TABS (GABAPENTIN) 1 q 8 as needed WARFARIN SODIUM 5 MG TABS (WARFARIN SODIUM) AS DIRECTED TYLENOL  ARTHRITIS PAIN 650 MG CR-TABS (ACETAMINOPHEN) as needed SYMBICORT 160-4.5 MCG/ACT AERO (BUDESONIDE-FORMOTEROL FUMARATE) 1-2 puffs every 12 hrs ; gargle & spit after use     Appended Document: PT CHECK//PH     Clinical Lists Changes  Orders: Added new Service order of Flu Vaccine 57yrs + MEDICARE PATIENTS (W1191) - Signed Added new Service order of Administration Flu vaccine - MCR (Y7829) - Signed Observations: Added new observation of FLU VAX VIS: 03/12/2010 version (07/24/2010 9:46) Added new observation of FLU VAXLOT: [id number] (07/24/2010 9:46) Added new observation of FLU VAXMFR: [employer] (07/24/2010 9:46) Added new observation of FLU VAX EXP: 02/15/2011 (07/24/2010 9:46) Added new observation of FLU VAX DSE: 0.40ml (07/24/2010 9:46) Added new observation of FLU VAX: Fluvax 3+ (07/24/2010 9:46)        Flu Vaccine Consent Questions     Do you have a history of severe allergic reactions to this vaccine? no    Any prior history of allergic reactions to egg and/or gelatin? no    Do you have a sensitivity to the preservative Thimersol? no    Do you have a past history of Guillan-Barre Syndrome? no    Do you currently have an acute febrile illness? no    Have you ever had a severe reaction to latex? no    Vaccine information given and explained to patient? yes    Are you currently pregnant? no    Lot Number:[id number]   Exp  Date:02/15/2011   Site Given  Left Deltoid IMedflu

## 2010-09-17 NOTE — Progress Notes (Signed)
Summary: PT/INR Results  Phone Note Outgoing Call Call back at Home Phone [phone]   Call placed by: Shonna Chock,  October 01, 2009 5:14 PM Call placed to: Insurer Details for Reason: PT/INR Results Summary of Call: Spoke with patient, patient aware level 2.5 (good). Keep med the same and recheck in 2 weeks  [facility]  October 01, 2009 5:14 PM     Appended Document: PT/INR Results 4 weeks, 2 weeks was entered in error

## 2010-09-17 NOTE — Progress Notes (Signed)
Summary: PT/INR Results  Phone Note Call from Patient Call back at Home Phone [phone]   Caller: Patient Summary of Call: Spoke with patient, patient aware PT/INR results: 2.5 patient was informed to keep meds the same and recheck level in 4 weeks.  Initial call taken by: Shonna Chock,  October 25, 2009 3:38 PM

## 2010-09-17 NOTE — Assessment & Plan Note (Signed)
Summary: pt check - Patrick Hatfield   Nurse Visit   Vital Signs:  Patient profile:   76 year old male Height:      69.5 inches Weight:      217 pounds Temp:     97.9 degrees F oral Pulse rate:   78 / minute BP sitting:   130 / 86  (left arm)  Vitals Entered By: Jeremy Johann CMA (August 27, 2009 9:07 AM)  Impression & Recommendations:  Problem # 1:  ENCOUNTER FOR THERAPEUTIC DRUG MONITORING (ICD-V58.83)  Orders: Protime (16109UE)  Problem # 2:  COAGULOPATHY (ICD-286.9)  Complete Medication List: 1)  Propranolol Hcl 40 Mg Tabs (Propranolol hcl) .Marland Kitchen.. 1 two times a day 2)  Losartan Potassium-hctz 100-12.5 Mg Tabs (Losartan potassium-hctz) .Marland Kitchen.. 1 by mouth once daily 3)  Fish Oil Double Strength 1200 Mg Caps (Omega-3 fatty acids) .... 2400mg  daily 4)  Vitd 1000mg   5)  Folic Acid 400mg   6)  Prilosec 20mg  Qd  .Marland Kitchen.. 3 x weekly 7)  Celebrex 200 Mg Caps (Celecoxib) .Marland Kitchen.. 1 by mouth as needed 8)  Gabapentin 300 Mg Tabs (Gabapentin) .Marland Kitchen.. 1 q 8 as needed 9)  Warfarin Sodium 5 Mg Tabs (Warfarin sodium) .... As directed 10)  Tylenol Arthritis Pain 650 Mg Cr-tabs (Acetaminophen) .... As needed   Patient Instructions: 1)  See written instructions  CC: pt check Comments REVIEWED MED LIST, PATIENT AGREED DOSE AND INSTRUCTION CORRECT    Allergies: 1)  ! * Lotrel Laboratory Results   Blood Tests    Date/Time Reported: August 27, 2009 9:08 AM  PT: 22.0 s   (Normal Range: 10.6-13.4)  INR: 3.3   (Normal Range: 0.88-1.12   Therap INR: 2.0-3.5)    Orders Added: 1)  Protime [85610QW] 2)  Est. Patient Level I [45409]    ANTICOAGULATION RECORD PREVIOUS REGIMEN & LAB RESULTS   Previous INR:  1.4 on  08/20/2009 Previous Coumadin Dose(mg):  5mg  daily except 2.5 sun,thur on  08/20/2009 Previous Regimen:  5mg  daily on  08/20/2009  NEW REGIMEN & LAB RESULTS Current INR: 3.3 Current Coumadin Dose(mg): 5 mg qd Regimen: 5 mg daily  except 2.5 monday and friday  Repeat testing in: 10  days  Anticoagulation Visit Questionnaire Coumadin dose missed/changed:  No Abnormal Bleeding Symptoms:  No  Any diet changes including alcohol intake, vegetables or greens since the last visit:  No Any illnesses or hospitalizations since the last visit:  No Any signs of clotting since the last visit (including chest discomfort, dizziness, shortness of breath, arm tingling, slurred speech, swelling or redness in leg):  No  MEDICATIONS PROPRANOLOL HCL 40 MG TABS (PROPRANOLOL HCL) 1 two times a day LOSARTAN POTASSIUM-HCTZ 100-12.5 MG TABS (LOSARTAN POTASSIUM-HCTZ) 1 by mouth once daily FISH OIL DOUBLE STRENGTH 1200 MG CAPS (OMEGA-3 FATTY ACIDS) 2400MG  DAILY * VITD 1000MG   * FOLIC ACID 400MG   * PRILOSEC 20MG  QD 3 X WEEKLY CELEBREX 200 MG  CAPS (CELECOXIB) 1 by mouth as needed GABAPENTIN 300 MG  TABS (GABAPENTIN) 1 q 8 as needed WARFARIN SODIUM 5 MG TABS (WARFARIN SODIUM) AS DIRECTED TYLENOL ARTHRITIS PAIN 650 MG CR-TABS (ACETAMINOPHEN) as needed

## 2010-09-17 NOTE — Assessment & Plan Note (Signed)
Summary: pt /kdc   Nurse Visit   Vital Signs:  Patient profile:   76 year old male Weight:      220.4 pounds Pulse rate:   64 / minute BP sitting:   112 / 72  (left arm) Cuff size:   large  Vitals Entered By: Shonna Chock (September 10, 2009 9:04 AM)  Impression & Recommendations:  Problem # 1:  ENCOUNTER FOR THERAPEUTIC DRUG MONITORING (ICD-V58.83)  Orders: Est. Patient Level I (11914) Protime (78295AO)  Problem # 2:  COAGULOPATHY (ICD-286.9)  Orders: Est. Patient Level I (13086) Protime (57846NG)  Complete Medication List: 1)  Propranolol Hcl 40 Mg Tabs (Propranolol hcl) .Marland Kitchen.. 1 two times a day 2)  Losartan Potassium-hctz 100-12.5 Mg Tabs (Losartan potassium-hctz) .Marland Kitchen.. 1 by mouth once daily 3)  Fish Oil Double Strength 1200 Mg Caps (Omega-3 fatty acids) .... 2400mg  daily 4)  Vitd 1000mg   5)  Folic Acid 400mg   6)  Prilosec 20mg  Qd  .Marland Kitchen.. 3 x weekly 7)  Celebrex 200 Mg Caps (Celecoxib) .Marland Kitchen.. 1 by mouth as needed 8)  Gabapentin 300 Mg Tabs (Gabapentin) .Marland Kitchen.. 1 q 8 as needed 9)  Warfarin Sodium 5 Mg Tabs (Warfarin sodium) .... As directed 10)  Tylenol Arthritis Pain 650 Mg Cr-tabs (Acetaminophen) .... As needed   Patient Instructions: 1)  See written instructions  CC: PT Check Comments REVIEWED MED LIST, PATIENT AGREED DOSE AND INSTRUCTION CORRECT    Allergies: 1)  ! * Lotrel Laboratory Results   Blood Tests      INR: 3.4   (Normal Range: 0.88-1.12   Therap INR: 2.0-3.5)    Orders Added: 1)  Est. Patient Level I [29528] 2)  Protime [41324MW]   ANTICOAGULATION RECORD PREVIOUS REGIMEN & LAB RESULTS   Previous INR:  3.3 on  08/27/2009 Previous Coumadin Dose(mg):  5 mg qd on  08/27/2009 Previous Regimen:  5 mg daily  except 2.5 monday and friday on  08/27/2009  NEW REGIMEN & LAB RESULTS Current INR: 3.4 Current Coumadin Dose(mg): 2.5mg  M/F, 5mg  all other days Regimen: 2.5mg  M/W/F, 5mg  all other days  Provider: Hopper,William      Repeat testing in:  3 weeks MEDICATIONS PROPRANOLOL HCL 40 MG TABS (PROPRANOLOL HCL) 1 two times a day LOSARTAN POTASSIUM-HCTZ 100-12.5 MG TABS (LOSARTAN POTASSIUM-HCTZ) 1 by mouth once daily FISH OIL DOUBLE STRENGTH 1200 MG CAPS (OMEGA-3 FATTY ACIDS) 2400MG  DAILY * VITD 1000MG   * FOLIC ACID 400MG   * PRILOSEC 20MG  QD 3 X WEEKLY CELEBREX 200 MG  CAPS (CELECOXIB) 1 by mouth as needed GABAPENTIN 300 MG  TABS (GABAPENTIN) 1 q 8 as needed WARFARIN SODIUM 5 MG TABS (WARFARIN SODIUM) AS DIRECTED TYLENOL ARTHRITIS PAIN 650 MG CR-TABS (ACETAMINOPHEN) as needed   Anticoagulation Visit Questionnaire      Coumadin dose missed/changed:  No      Abnormal Bleeding Symptoms:  No   Any diet changes including alcohol intake, vegetables or greens since the last visit:  No Any illnesses or hospitalizations since the last visit:  No Any signs of clotting since the last visit (including chest discomfort, dizziness, shortness of breath, arm tingling, slurred speech, swelling or redness in leg):  No

## 2010-09-17 NOTE — Progress Notes (Signed)
Summary: Refill Request  Phone Note Refill Request Message from:  Patient  Refills Requested: Medication #1:  LOSARTAN POTASSIUM-HCTZ 100-12.5 MG TABS 1 by mouth once daily [facility]   Method Requested: Fax to Local Pharmacy Initial call taken by: Shonna Chock,  November 21, 2009 2:31 PM  Follow-up for Phone Call        Called the pharmacy and left message informing then patient is no longer taking Benazepril. Losartan replaced benazepril Follow-up by: Shonna Chock,  November 21, 2009 2:35 PM    Prescriptions: LOSARTAN POTASSIUM-HCTZ 100-12.5 MG TABS (LOSARTAN POTASSIUM-HCTZ) 1 by mouth once daily  #30 x 5   Entered by:   Shonna Chock   Authorized by:   Marga Melnick MD   Signed by:   Shonna Chock on 11/21/2009   Method used:   Electronically to        [facility] [address] [phone]* (retail)       [address]       [place], [place]  [address]       Ph: [phone] or [phone]       Fax: [phone]   RxID:   [id number]

## 2010-09-17 NOTE — Letter (Signed)
[facility]   Imported By: Lanelle Bal 12/03/2009 10:30:43  _____________________________________________________________________  External Attachment:    Type:   Image     Comment:   External Document

## 2010-09-17 NOTE — Assessment & Plan Note (Signed)
Summary: pt//lch   Nurse Visit   Vital Signs:  Patient profile:   76 year old male Weight:      221 pounds Temp:     98.1 degrees F oral Pulse rate:   78 / minute Resp:     16 per minute BP sitting:   122 / 80  (left arm)  Vitals Entered By: Jeremy Johann CMA (October 25, 2009 9:55 AM)  CC:  pt and discuss med.  History of Present Illness: Patrick Hatfield is flying to [place] 10/29/2009; he is on lifelong warfarin due to Factor V  deficiency. He has no C-P symptoms. He uses Gabapentin 300 mg as needed for sleep; he has no neuropathy or post herpetic neuralgia of significance.   Review of Systems CV:  Denies chest pain or discomfort, difficulty breathing at night, difficulty breathing while lying down, shortness of breath with exertion, swelling of feet, and swelling of hands. Resp:  Denies chest pain with inspiration, coughing up blood, pleuritic, and shortness of breath. Neuro:  Denies numbness and tingling.   Physical Exam  General:  well-nourished; alert,appropriate and cooperative throughout examination Lungs:  Normal respiratory effort, chest expands symmetrically. Lungs are clear to auscultation, no crackles or wheezes. Heart:  Normal rate and regular rhythm. S1 and S2 normal without gallop, murmur, click, rub.S4 Pulses:  R and L carotid,radial,dorsalis pedis and posterior tibial pulses are full and equal bilaterally Extremities:  No clubbing, cyanosis, edema. Neg Homan's Neurologic:  alert & oriented X3 and DTRs symmetrical and normal.      Impression & Recommendations:  Problem # 1:  SLEEP DISORDER, CHRONIC (ICD-780.50)  controlled with Gabapentin 300-900 mg at bedtime as needed   Orders: Prescription Created Electronically [id number])  Problem # 2:  FACTOR V DEFICIENCY (ICD-286.3)  Problem # 3:  ENCOUNTER FOR THERAPEUTIC DRUG MONITORING (ICD-V58.83)  Complete Medication List: 1)  Propranolol Hcl 40 Mg Tabs (Propranolol hcl) .Marland Kitchen.. 1 two times a day 2)  Losartan  Potassium-hctz 100-12.5 Mg Tabs (Losartan potassium-hctz) .Marland Kitchen.. 1 by mouth once daily 3)  Fish Oil Double Strength 1200 Mg Caps (Omega-3 fatty acids) .... 2400mg  daily 4)  Vitd 1000mg   5)  Folic Acid 400mg   6)  Prilosec 20mg  Qd  .Marland Kitchen.. 3 x weekly 7)  Celebrex 200 Mg Caps (Celecoxib) .Marland Kitchen.. 1 by mouth as needed 8)  Gabapentin 300 Mg Tabs (Gabapentin) .Marland Kitchen.. 1 q 8 as needed 9)  Warfarin Sodium 5 Mg Tabs (Warfarin sodium) .... As directed 10)  Tylenol Arthritis Pain 650 Mg Cr-tabs (Acetaminophen) .... As needed  Other Orders: TLB-PT (Protime) (85610-PTP)   Patient Instructions: 1)  Present dose : 5 mg once daily except 2.5 mg M,W,F(Note: he missed 03/07)   Family History: Father:  pancreatic CA,ulcers,CVA,DM Mother: CAD,osteoporosis Siblings: bro +HIV; sister MS; PGF & P uncles MI in 31s; no FH of coagulopathy   Past History:  Past Medical History: Gilbert's Syndrome; benign tremor; Hyperglycemia; Colitis in  his 73s;  GERD Hyperlipidemia Hypertension DVT, hx of 1991;Superior mesenteric vein thrombus 04/2009 due to Factor V Leiden Coagulopathy Proteinuria age 89  CC: pt, discuss med Comments REVIEWED MED LIST, PATIENT AGREED DOSE AND INSTRUCTION CORRECT    Allergies: 1)  ! * Lotrel  Orders Added: 1)  TLB-PT (Protime) [85610-PTP] 2)  Est. Patient Level III [60454] 3)  Prescription Created Electronically [phone] Prescriptions: GABAPENTIN 300 MG  TABS (GABAPENTIN) 1 q 8 as needed  #270 x 3   Entered and Authorized by:   Marga Melnick MD  Signed by:   Marga Melnick MD on 10/25/2009   Method used:   [facility]ed to ...       [facility] [address] [phone][facility] ([facility]ail)       [address]       [place], [place]  [address]       Ph: [phone] or [phone]       Fax: [phone]   RxID:   [id number]

## 2010-09-17 NOTE — Assessment & Plan Note (Signed)
Summary: Nurse Visit PT/INR/scm  Medications Added LOSARTAN POTASSIUM-HCTZ 100-12.5 MG TABS (LOSARTAN POTASSIUM-HCTZ) 1 by mouth once daily       Nurse Visit   Vital Signs:  Patient profile:   76 year old male Height:      69.5 inches Weight:      219 pounds BMI:     31.99 Temp:     98.9 degrees F oral Pulse rate:   80 / minute BP sitting:   130 / 90  (left arm)  Vitals Entered By: Jeremy Johann CMA (August 20, 2009 9:59 AM)  Impression & Recommendations:  Problem # 1:  ENCOUNTER FOR THERAPEUTIC DRUG MONITORING (ICD-V58.83)  Orders: Protime (32440NU)  Problem # 2:  COAGULOPATHY (ICD-286.9)  Orders: Protime ([id number])  Complete Medication List: 1)  Propranolol Hcl 40 Mg Tabs (Propranolol hcl) .Marland Kitchen.. 1 two times a day 2)  Losartan Potassium-hctz 100-12.5 Mg Tabs (Losartan potassium-hctz) .Marland Kitchen.. 1 by mouth once daily 3)  Fish Oil Double Strength 1200 Mg Caps (Omega-3 fatty acids) .... 2400mg  daily 4)  Vitd 1000mg   5)  Folic Acid 400mg   6)  Prilosec 20mg  Qd  .Marland Kitchen.. 3 x weekly 7)  Celebrex 200 Mg Caps (Celecoxib) .Marland Kitchen.. 1 by mouth as needed 8)  Gabapentin 300 Mg Tabs (Gabapentin) .Marland Kitchen.. 1 q 8 as needed 9)  Warfarin Sodium 5 Mg Tabs (Warfarin sodium) .... As directed 10)  Tylenol Arthritis Pain 650 Mg Cr-tabs (Acetaminophen) .... As needed   Patient Instructions: 1)  See written instructions  CC: PT check, Per patient request change Benicar to Losartan Comments REVIEWED MED LIST, PATIENT AGREED DOSE AND INSTRUCTION CORRECT    Allergies: 1)  ! * Lotrel Laboratory Results   Blood Tests    Date/Time Reported: August 20, 2009 10:04 AM   INR: 1.4   (Normal Range: 0.88-1.12   Therap INR: 2.0-3.5)    Orders Added: 1)  Est. Patient Level I [44034] 2)  Protime [[id number]] Prescriptions: LOSARTAN POTASSIUM-HCTZ 100-12.5 MG TABS (LOSARTAN POTASSIUM-HCTZ) 1 by mouth once daily  #30 x 3   Entered by:   Shonna Chock   Authorized by:   Marga Melnick MD   Signed by:    Shonna Chock on 08/20/2009   Method used:   [facility] to        [facility] [address] [phone]* (retail)       [address]       [place], [place]  [address]       Ph: [phone] or [phone]       Fax: [phone]   RxID:   [id number]     ANTICOAGULATION RECORD PREVIOUS REGIMEN & LAB RESULTS   Previous INR:  3.5 ratio on  08/06/2009 Previous Coumadin Dose(mg):  5MG  DAILY EXCEPT 2.5MG  ON T/FRI on  07/23/2009 Previous Regimen:  7.5MG  TODAY THEN RESUME REGULAR SCHEDULE on  07/23/2009  NEW REGIMEN & LAB RESULTS Current INR: 1.4 Current Coumadin Dose(mg): 5mg  daily except 2.5 sun,thur Regimen: 5mg  daily  Provider: Hopper,William Repeat testing in: 1 week  Anticoagulation Visit Questionnaire Coumadin dose missed/changed:  No Abnormal Bleeding Symptoms:  No  Any diet changes including alcohol intake, vegetables or greens since the last visit:  Yes Any illnesses or hospitalizations since the last visit:  No Any signs of clotting since the last visit (including chest discomfort, dizziness, shortness of breath, arm tingling, slurred speech, swelling or redness in leg):  No  MEDICATIONS PROPRANOLOL HCL 40 MG TABS (PROPRANOLOL HCL) 1 two times  a day LOSARTAN POTASSIUM-HCTZ 100-12.5 MG TABS (LOSARTAN POTASSIUM-HCTZ) 1 by mouth once daily FISH OIL DOUBLE STRENGTH 1200 MG CAPS (OMEGA-3 FATTY ACIDS) 2400MG  DAILY * VITD 1000MG   * FOLIC ACID 400MG   * PRILOSEC 20MG  QD 3 X WEEKLY CELEBREX 200 MG  CAPS (CELECOXIB) 1 by mouth as needed GABAPENTIN 300 MG  TABS (GABAPENTIN) 1 q 8 as needed WARFARIN SODIUM 5 MG TABS (WARFARIN SODIUM) AS DIRECTED TYLENOL ARTHRITIS PAIN 650 MG CR-TABS (ACETAMINOPHEN) as needed    ANTICOAGULATION RECORD PREVIOUS REGIMEN & LAB RESULTS   Previous INR:  3.5 ratio on  08/06/2009 Previous Coumadin Dose(mg):  5MG  DAILY EXCEPT 2.5MG  ON T/FRI on  07/23/2009 Previous Regimen:  7.5MG  TODAY THEN RESUME REGULAR SCHEDULE on  07/23/2009  NEW REGIMEN  & LAB RESULTS Current INR: 1.4 Current Coumadin Dose(mg): 5mg  daily except 2.5 sun,thur Regimen: 5mg  daily       Repeat testing in: 1 week MEDICATIONS PROPRANOLOL HCL 40 MG TABS (PROPRANOLOL HCL) 1 two times a day LOSARTAN POTASSIUM-HCTZ 100-12.5 MG TABS (LOSARTAN POTASSIUM-HCTZ) 1 by mouth once daily FISH OIL DOUBLE STRENGTH 1200 MG CAPS (OMEGA-3 FATTY ACIDS) 2400MG  DAILY * VITD 1000MG   * FOLIC ACID 400MG   * PRILOSEC 20MG  QD 3 X WEEKLY CELEBREX 200 MG  CAPS (CELECOXIB) 1 by mouth as needed GABAPENTIN 300 MG  TABS (GABAPENTIN) 1 q 8 as needed WARFARIN SODIUM 5 MG TABS (WARFARIN SODIUM) AS DIRECTED TYLENOL ARTHRITIS PAIN 650 MG CR-TABS (ACETAMINOPHEN) as needed   Anticoagulation Visit Questionnaire      Coumadin dose missed/changed:  No      Abnormal Bleeding Symptoms:  No

## 2010-09-19 NOTE — Letter (Signed)
Summary: Colonoscopy Letter   Gastroenterology  [address]   [place], [place] [address]   Phone: [phone]  Fax: [phone]      September 02, 2010 MRN: [id number]   Patrick Hatfield [address] [place], [place]  [address]   Dear [name],   According to your medical record, it is time for you to schedule a Colonoscopy. The American Cancer Society recommends this procedure as a method to detect early colon cancer. Patients with a family history of colon cancer, or a personal history of colon polyps or inflammatory bowel disease are at increased risk.  This letter has been generated based on the recommendations made at the time of your procedure. If you feel that in your particular situation this may no longer apply, please contact our office.  Please call our office at [phone] to schedule this appointment or to update your records at your earliest convenience.  Thank you for cooperating with [place] to provide you with the very best care possible.   Sincerely,   Vania Rea. Jarold Motto, M.D.  [facility] [phone]

## 2010-09-19 NOTE — Assessment & Plan Note (Signed)
Summary: pt check///sph   Nurse Visit   Vital Signs:  Patient profile:   76 year old male Height:      69.5 inches (176.53 cm) Weight:      219.25 pounds (99.66 kg) BMI:     32.03 Temp:     98.7 degrees F (37.06 degrees C) oral BP sitting:   110 / 74  (left arm) Cuff size:   large  Vitals Entered By: Lucious Groves CMA (August 14, 2010 9:46 AM) CC: PT check./kb   Allergies: 1)  ! * Lotrel Laboratory Results   Blood Tests      INR: 4.1   (Normal Range: 0.88-1.12   Therap INR: 2.0-3.5)    Orders Added: 1)  Est. Patient Level I [04540] 2)  Protime [[id number]]   ANTICOAGULATION RECORD PREVIOUS REGIMEN & LAB RESULTS Anticoagulation Diagnosis:  Deep venous thrombosis on  07/24/2010  Previous INR:  2.2 on  07/24/2010 Previous Coumadin Dose(mg):  5mg  daily except 2.5mg  on M/W/F on  07/17/2010 Previous Regimen:  5mg  qd except 2.5mg  on Wed. on  07/24/2010  NEW REGIMEN & LAB RESULTS Anticoag. Dx: Deep venous thrombosis Current INR: 4.1 Regimen: 5mg  qd except 2.5mg  on Wed.  (no change)  Provider: Alwyn Ren Other Comments: Patient notes that he has has 5mg  tabs and takes 1 by mouth once daily except for 1/2 on Wed. only. He also notes that he is scheduled for knee surgery next week and is to stop his coumadin today. Based on patient INR, please advise.    Per MD:  1. ?Did they request pre-op clearance? 2. He will need Lovenox coverage peri-operatively through Coumadin clinic. *Patient notified and will speak with his surgeon and call me back. Lucious Groves CMA  August 14, 2010 4:11 PM   Dose has been reviewed with patient or caretaker during this visit. Reviewed by: Lucious Groves   Appended Document: Orders Update     Clinical Lists Changes  Orders: Added new Referral order of Cardiology Referral (Cardiology) - Signed

## 2010-09-19 NOTE — Assessment & Plan Note (Signed)
Summary: SWELLING BELOW SURGERY SITE/HX OF DVT/KB   Vital Signs:  Patient profile:   76 year old male Weight:      221.13 pounds Temp:     98.3 degrees F oral Pulse rate:   88 / minute Pulse rhythm:   regular BP sitting:   126 / 86  (left arm) Cuff size:   large  Vitals Entered By: Army Fossa CMA (August 26, 2010 2:15 PM) CC: Had surgery on (L) knee last week- swelling below site.  Comments did not call surgeon [facility]   History of Present Illness: status post a  Left knee arthroscopy with partial medial meniscectomy per doctor [place], 08-20-10. doing well until yesterday when noted swelling at the proximal pretibial area on-off. no pain  Review of systems After the surgery, he took Lovenox and is continuing to one-8-12 As far as the Coumadin, he took 7.5 mg January 3, 4, 5; then 5 mg. No fevers No chest pain or shortness of breath  Current Medications (verified): 1)  Propranolol Hcl 40 Mg Tabs (Propranolol Hcl) .Marland Kitchen.. 1 Two Times A Day 2)  Losartan Potassium-Hctz 100-12.5 Mg Tabs (Losartan Potassium-Hctz) .Marland Kitchen.. 1 By Mouth Once Daily 3)  Fish Oil Double Strength 1200 Mg Caps (Omega-3 Fatty Acids) .... 2400mg  Daily 4)  Vitd 1000mg  5)  Folic Acid 400mg  6)  Prilosec 20mg  Qd .Marland Kitchen.. 3 X Weekly 7)  Celebrex 200 Mg  Caps (Celecoxib) .Marland Kitchen.. 1 By Mouth As Needed 8)  Gabapentin 300 Mg  Tabs (Gabapentin) .Marland Kitchen.. 1 Q 8 As Needed 9)  Warfarin Sodium 5 Mg Tabs (Warfarin Sodium) .... As Directed 10)  Tylenol Arthritis Pain 650 Mg Cr-Tabs (Acetaminophen) .... As Needed 11)  Enoxaparin Sodium 150 Mg/ml Soln (Enoxaparin Sodium) .... Inject 1 Syringe Subcutaneously Once Daily As Directed 12)  Oxycodone-Acetaminophen 5-500 Mg Caps (Oxycodone-Acetaminophen) .... As Needed- Per Surgeon  Allergies (verified): 1)  ! * Lotrel  Past History:  Past Medical History: Reviewed history from 10/25/2009 and no changes required. Gilbert's Syndrome; benign tremor; Hyperglycemia; Colitis in  his  28s;  GERD Hyperlipidemia Hypertension DVT, hx of 1991;Superior mesenteric vein thrombus 04/2009 due to Factor V Leiden Coagulopathy Proteinuria age 46  Past Surgical History: Reviewed history from 04/09/2009 and no changes required. Colonoscopy 2008 neg, Dr Jarold Motto ; Arthroscopy knee, Dr Simonne Come 2009 Transurethral resection of prostate, Cystoscopy neg , Dr Retta Diones Tonsillectomy  Social History: Reviewed history from 10/03/2008 and no changes required. Retired Married Former Smoker Alcohol use-yes Regular exercise-yes  Physical Exam  General:  alert, well-developed, and well-nourished. no apparent distress   Pulses:  normal bilateral pedal pulses Extremities:  in the proximal left pretibial area there is some swelling and discomfort to palpation. Some ecchymoses. otherwise no pitting pretibial edema  calves are both soft and nontender.    Impression & Recommendations:  Problem # 1:  ? of DVT (ICD-453.40)  status post left knee surgery 6 days ago, presents with pretibial swelling. he is high risk for DVTs although he has been appropriately anticoagulated (INR today 2.8) Plan: To be sure, will check ultrasound of the left leg. Continue Coumadin, see instructions   Orders: Protime ([id number])  Problem # 2:  addendum phone call last night, ultrasound negative for acute DVTs. (He did have a old DVT) Briyanna Billingham E. Treyten Monestime MD  August 27, 2010 9:32 AM   Complete Medication List: 1)  Propranolol Hcl 40 Mg Tabs (Propranolol hcl) .Marland Kitchen.. 1 two times a day 2)  Losartan Potassium-hctz 100-12.5 Mg  Tabs (Losartan potassium-hctz) .Marland Kitchen.. 1 by mouth once daily 3)  Fish Oil Double Strength 1200 Mg Caps (Omega-3 fatty acids) .... 2400mg  daily 4)  Vitd 1000mg   5)  Folic Acid 400mg   6)  Prilosec 20mg  Qd  .Marland Kitchen.. 3 x weekly 7)  Celebrex 200 Mg Caps (Celecoxib) .Marland Kitchen.. 1 by mouth as needed 8)  Gabapentin 300 Mg Tabs (Gabapentin) .Marland Kitchen.. 1 q 8 as needed 9)  Warfarin Sodium 5 Mg Tabs (Warfarin sodium)  .... As directed 10)  Tylenol Arthritis Pain 650 Mg Cr-tabs (Acetaminophen) .... As needed 11)  Enoxaparin Sodium 150 Mg/ml Soln (Enoxaparin sodium) .... Inject 1 syringe subcutaneously once daily as directed 12)  Oxycodone-acetaminophen 5-500 Mg Caps (Oxycodone-acetaminophen) .... As needed- per surgeon  Other Orders: Radiology Referral (Radiology)  Patient Instructions: 1)  elevate the left leg the basic plan 2)  Continue with Coumadin 5mg  tablets, 1 a day 3)  INR here 08-30-10 4)  call if swelling worse   Orders Added: 1)  Protime [[id number]] 2)  Radiology Referral [Radiology] 3)  Est. Patient Level III [99213]    Laboratory Results   Blood Tests      INR: 2.8   (Normal Range: 0.88-1.12   Therap INR: 2.0-3.5) Comments: Army Fossa CMA  August 26, 2010 2:38 PM

## 2010-09-19 NOTE — Assessment & Plan Note (Signed)
Summary: PT check/kb      Allergies Added:  Nurse Visit   Vital Signs:  Patient profile:   76 year old male Height:      69.5 inches (176.53 cm) Weight:      217.38 pounds (98.81 kg) BMI:     31.76 Temp:     98.6 degrees F (37.00 degrees C) oral BP sitting:   110 / 76  (left arm) Cuff size:   large  Vitals Entered By: Lucious Groves CMA (August 30, 2010 9:17 AM) CC: PT check./kb   Allergies (verified): 1)  ! * Lotrel Laboratory Results   Blood Tests      INR: 2.9   (Normal Range: 0.88-1.12   Therap INR: 2.0-3.5)    Orders Added: 1)  Est. Patient Level I [47425] 2)  Protime [[id number]]   ANTICOAGULATION RECORD PREVIOUS REGIMEN & LAB RESULTS Anticoagulation Diagnosis:  Deep venous thrombosis on  08/23/2010  Previous INR:  2.8 on  08/26/2010 Previous Coumadin Dose(mg):  5mg  on  08/23/2010 Previous Regimen:  5mg  qd except 2.5mg  on Wed. on  07/24/2010 Previous Coagulation Comments:  see below on  08/23/2010  NEW REGIMEN & LAB RESULTS Anticoag. Dx: Deep venous thrombosis Current INR: 2.9 Current Coumadin Dose(mg): 5mg  qd Regimen: same  Provider: Alwyn Ren  Repeat testing in: 1 week Other Comments: Patient notified that we will re-check in one week just to be safe. He is also aware to call immediately if he has more symptoms of a clot. Lucious Groves CMA  August 30, 2010 9:19 AM   Dose has been reviewed with patient or caretaker during this visit. Reviewed by: Lucious Groves  Anticoagulation Visit Questionnaire Coumadin dose missed/changed:  No Abnormal Bleeding Symptoms:  No  Any diet changes including alcohol intake, vegetables or greens since the last visit:  No Any illnesses or hospitalizations since the last visit:  No Any signs of clotting since the last visit (including chest discomfort, dizziness, shortness of breath, arm tingling, slurred speech, swelling or redness in leg):  No  MEDICATIONS PROPRANOLOL HCL 40 MG TABS (PROPRANOLOL HCL) 1 two times a  day LOSARTAN POTASSIUM-HCTZ 100-12.5 MG TABS (LOSARTAN POTASSIUM-HCTZ) 1 by mouth once daily FISH OIL DOUBLE STRENGTH 1200 MG CAPS (OMEGA-3 FATTY ACIDS) 2400MG  DAILY * VITD 1000MG   * FOLIC ACID 400MG   * PRILOSEC 20MG  QD 3 X WEEKLY CELEBREX 200 MG  CAPS (CELECOXIB) 1 by mouth as needed GABAPENTIN 300 MG  TABS (GABAPENTIN) 1 q 8 as needed WARFARIN SODIUM 5 MG TABS (WARFARIN SODIUM) AS DIRECTED TYLENOL ARTHRITIS PAIN 650 MG CR-TABS (ACETAMINOPHEN) as needed ENOXAPARIN SODIUM 150 MG/ML SOLN (ENOXAPARIN SODIUM) Inject 1 syringe subcutaneously once daily as directed OXYCODONE-ACETAMINOPHEN 5-500 MG CAPS (OXYCODONE-ACETAMINOPHEN) as needed- per surgeon

## 2010-09-19 NOTE — Assessment & Plan Note (Signed)
Summary: PT/KN   Nurse Visit   Vital Signs:  Patient profile:   76 year old male Height:      69.5 inches (176.53 cm) Weight:      224.13 pounds (101.88 kg) BMI:     32.74 Temp:     98.7 degrees F (37.06 degrees C) oral BP sitting:   112 / 66  (left arm) Cuff size:   large  Vitals Entered By: Lucious Groves CMA (August 23, 2010 10:40 AM) CC: PT check./kb   Allergies: 1)  ! * Lotrel Laboratory Results   Blood Tests      INR: 1.9   (Normal Range: 0.88-1.12   Therap INR: 2.0-3.5)    Orders Added: 1)  Est. Patient Level I [09811] 2)  Protime [[id number]]   ANTICOAGULATION RECORD PREVIOUS REGIMEN & LAB RESULTS Anticoagulation Diagnosis:  Deep venous thrombosis on  08/14/2010  Previous INR:  4.1 on  08/14/2010 Previous Coumadin Dose(mg):  5mg  on  08/16/2010 Previous Regimen:  5mg  qd except 2.5mg  on Wed. on  07/24/2010  NEW REGIMEN & LAB RESULTS Anticoag. Dx: Deep venous thrombosis Current INR: 1.9 Current Coumadin Dose(mg): 5mg  Regimen: 5mg  qd except 2.5mg  on Wed.  (no change) Coagulation Comments: see below Provider: Alwyn Ren Other Comments: Patient notes that he has 5mg  tabs at home. He had arthroscopic knee surgery on Tues and had to hold the coumadin 5 days prior. He notes that is also on Lovenox 150mg  once daily (he has #5 injections left). Patient took 7.5mg  of coumadin on 1/3 &1/4 then resumed his regular 5mg  once daily along with 150mg  of Lovenox once daily. Patient is aware I will call him at home with MD recommendations. Lucious Groves CMA  August 23, 2010 10:42 AM   Per MD: the patient will take 7.5mg  on 1/6 & 1/7 then resume 5mg  once daily. Re-check in 1 week. Lucious Groves CMA  August 23, 2010 2:42 PM   Patient notified. Lucious Groves CMA  August 23, 2010 2:46 PM     Appended Document: PT/KN Per MD patient was advised to take Lovenox through the weekend (fri, sat, and sun) and then stop. Patient aware.

## 2010-09-19 NOTE — Letter (Signed)
Summary: [facility]   Imported By: Lanelle Bal 08/27/2010 14:27:09  _____________________________________________________________________  External Attachment:    Type:   Image     Comment:   External Document

## 2010-09-19 NOTE — Assessment & Plan Note (Signed)
Summary: pt check///sph   Nurse Visit   Vital Signs:  Patient profile:   76 year old male Height:      69.5 inches (176.53 cm) Weight:      217.50 pounds (98.86 kg) BMI:     31.77 Temp:     98.2 degrees F (36.78 degrees C) oral BP sitting:   110 / 70  (left arm)  Vitals Entered By: Lucious Groves CMA (September 06, 2010 9:45 AM) CC: PT./kb   Allergies: 1)  ! * Lotrel Laboratory Results   Blood Tests      INR: 3.0   (Normal Range: 0.88-1.12   Therap INR: 2.0-3.5)    Orders Added: 1)  Est. Patient Level I [16109] 2)  Protime [[id number]]   ANTICOAGULATION RECORD PREVIOUS REGIMEN & LAB RESULTS Anticoagulation Diagnosis:  Deep venous thrombosis on  08/30/2010  Previous INR:  2.9 on  08/30/2010 Previous Coumadin Dose(mg):  5mg  qd on  08/30/2010 Previous Regimen:  same on  08/30/2010 Previous Coagulation Comments:  see below on  08/23/2010  NEW REGIMEN & LAB RESULTS Anticoag. Dx: Deep venous thrombosis Current INR: 3.0 Regimen: same  (no change)  Provider: Alwyn Ren Other Comments: Patient notes that he has 5mg  tabs and takes one once daily.  Per MD: Patient will take 5mg  once daily EXCEPT 2.5mg  on Wed. only and re-check in 3 weeks. Patient aware. Lucious Groves CMA  September 06, 2010 11:35 AM    Anticoagulation Visit Questionnaire Coumadin dose missed/changed:  No Abnormal Bleeding Symptoms:  No  Any diet changes including alcohol intake, vegetables or greens since the last visit:  No Any illnesses or hospitalizations since the last visit:  No Any signs of clotting since the last visit (including chest discomfort, dizziness, shortness of breath, arm tingling, slurred speech, swelling or redness in leg):  No  MEDICATIONS PROPRANOLOL HCL 40 MG TABS (PROPRANOLOL HCL) 1 two times a day LOSARTAN POTASSIUM-HCTZ 100-12.5 MG TABS (LOSARTAN POTASSIUM-HCTZ) 1 by mouth once daily FISH OIL DOUBLE STRENGTH 1200 MG CAPS (OMEGA-3 FATTY ACIDS) 2400MG  DAILY * VITD 1000MG   * FOLIC ACID  400MG   * PRILOSEC 20MG  QD 3 X WEEKLY CELEBREX 200 MG  CAPS (CELECOXIB) 1 by mouth as needed GABAPENTIN 300 MG  TABS (GABAPENTIN) 1 q 8 as needed WARFARIN SODIUM 5 MG TABS (WARFARIN SODIUM) AS DIRECTED TYLENOL ARTHRITIS PAIN 650 MG CR-TABS (ACETAMINOPHEN) as needed ENOXAPARIN SODIUM 150 MG/ML SOLN (ENOXAPARIN SODIUM) Inject 1 syringe subcutaneously once daily as directed OXYCODONE-ACETAMINOPHEN 5-500 MG CAPS (OXYCODONE-ACETAMINOPHEN) as needed- per surgeon

## 2010-09-19 NOTE — Medication Information (Signed)
Summary: Patrick Hatfield   Anticoagulant Therapy  Managed by: Tammy Sours, PharmD Supervising MD: Clifton Ura MD, Cristal Deer Indication 1: Deep venous thrombosis INR POC 3.4  Dietary changes: no    Health status changes: no    Bleeding/hemorrhagic complications: no    Recent/future hospitalizations: yes       Details: Orthoscopic knee surgery in 1 week   Any changes in medication regimen? no    Recent/future dental: no  Any missed doses?: no       Is patient compliant with meds? yes       Allergies: 1)  ! * Lotrel  Anticoagulation Management History:      The patient is taking warfarin and comes in today for a routine follow up visit.  Positive risk factors for bleeding include an age of 74 years or older.  Negative risk factors for bleeding include no history of CVA/TIA.  The bleeding index is 'intermediate risk'.  Positive CHADS2 values include History of HTN.  Negative CHADS2 values include Age > 63 years old, History of Diabetes, and Prior Stroke/CVA/TIA.  His last INR was 4.1.  Anticoagulation responsible provider: Clifton Sherod MD, Cristal Deer.  INR POC: 3.4.  Cuvette Lot#: [id number].    Anticoagulation Management Assessment/Plan:      The patient's current anticoagulation dose is Warfarin sodium 5 mg tabs: AS DIRECTED.  The next INR is due 3 weeks.  Results were reviewed/authorized by Tammy Sours, PharmD.         Current Anticoagulation Instructions: INR 3.4   Do not take coumadin today 12/30 or tomorrow 12/31. Lovenox injections will begin on 1/1. Take 150mg  of Lovenox in the AM on 1/1 and 1/2. The knee surgery is on 08/20/10 at 12pm. When okay with MD, restart Coumadin AND Lovenox.  Continue Lovenox 150mg  once daily.  Take 1 1/2 tablets x 2 days then resume normal dose.  Recheck INR at Dr. Frederik Pear office on 08/23/10  Appended Document: Patrick Hatfield    Clinical Lists Changes  Medications: Added new medication of ENOXAPARIN SODIUM 150 MG/ML SOLN (ENOXAPARIN SODIUM) Inject 1  syringe subcutaneously once daily as directed - Signed Rx of ENOXAPARIN SODIUM 150 MG/ML SOLN (ENOXAPARIN SODIUM) Inject 1 syringe subcutaneously once daily as directed;  #10 x 1;  Signed;  Entered by: Weston Brass PharmD;  Authorized by: Marga Melnick MD;  Method used: [facility] [address] [phone]*, [address], [place], [place]  [address], Ph: [phone] or [phone], Fax: [phone]    Prescriptions: ENOXAPARIN SODIUM 150 MG/ML SOLN (ENOXAPARIN SODIUM) Inject 1 syringe subcutaneously once daily as directed  #10 x 1   Entered by:   Weston Brass PharmD   Authorized by:   Marga Melnick MD   Signed by:   Weston Brass PharmD on 08/16/2010   Method used:   Electronically to        [facility] [phone]* (retail)       [address]       [place], [place]  [address]       Ph: [phone] or [phone]       Fax: [phone]   RxID:   [id number]

## 2010-09-27 ENCOUNTER — Encounter: Payer: Self-pay | Admitting: Internal Medicine

## 2010-09-27 ENCOUNTER — Ambulatory Visit (INDEPENDENT_AMBULATORY_CARE_PROVIDER_SITE_OTHER): Payer: Medicare Other

## 2010-09-27 DIAGNOSIS — Z5181 Encounter for therapeutic drug level monitoring: Secondary | ICD-10-CM

## 2010-09-27 DIAGNOSIS — Z86718 Personal history of other venous thrombosis and embolism: Secondary | ICD-10-CM

## 2010-09-27 DIAGNOSIS — D689 Coagulation defect, unspecified: Secondary | ICD-10-CM

## 2010-10-18 ENCOUNTER — Ambulatory Visit (INDEPENDENT_AMBULATORY_CARE_PROVIDER_SITE_OTHER): Payer: Medicare Other

## 2010-10-18 ENCOUNTER — Encounter: Payer: Self-pay | Admitting: Internal Medicine

## 2010-10-18 DIAGNOSIS — Z86718 Personal history of other venous thrombosis and embolism: Secondary | ICD-10-CM

## 2010-10-18 DIAGNOSIS — D689 Coagulation defect, unspecified: Secondary | ICD-10-CM

## 2010-10-18 DIAGNOSIS — Z5181 Encounter for therapeutic drug level monitoring: Secondary | ICD-10-CM

## 2010-10-18 LAB — CONVERTED CEMR LAB: POC INR: 2.9

## 2010-10-24 NOTE — Medication Information (Signed)
Summary: pt/cbs  Anticoagulant Therapy  Managed by: Marga Melnick, MD Supervising MD: Clifton Clinton MD, Cristal Deer Indication 1: Deep venous thrombosis INR POC 2.9  Vital Signs: Weight: 222 lbs.  Pulse Rate: 68  Blood Pressure:  120 / 70   Dietary changes: no    Health status changes: no    Bleeding/hemorrhagic complications: no    Recent/future hospitalizations: no    Any changes in medication regimen? no    Recent/future dental: no  Any missed doses?: no       Is patient compliant with meds? yes      Comments: Dose on 09/27/10 was supposed to be 5mg  daily except 2.5mg  on M,F patient taking- 5mg  tab- M,W,F 2.5mg  and 5mg  all other days....Marland KitchenMarland KitchenDoristine Devoid CMA  October 18, 2010 10:54 AM  advised no change recheck 4 weeks.Marland KitchenMarland KitchenDoristine Devoid CMA  October 18, 2010 10:55 AM   Allergies: 1)  ! * Lotrel  Anticoagulation Management History:      Positive risk factors for bleeding include an age of 74 years or older.  Negative risk factors for bleeding include no history of CVA/TIA.  The bleeding index is 'intermediate risk'.  Positive CHADS2 values include History of HTN.  Negative CHADS2 values include Age > 74 years old, History of Diabetes, and Prior Stroke/CVA/TIA.  His last INR was 3.2.  Anticoagulation responsible provider: Clifton Morrison MD, Cristal Deer.  INR POC: 2.9.    Anticoagulation Management Assessment/Plan:      The patient's current anticoagulation dose is Warfarin sodium 5 mg tabs: AS DIRECTED.  The next INR is due 3 weeks.  Results were reviewed/authorized by Marga Melnick, MD.         Prior Anticoagulation Instructions: INR 3.4   Do not take coumadin today 12/30 or tomorrow 12/31. Lovenox injections will begin on 1/1. Take 150mg  of Lovenox in the AM on 1/1 and 1/2. The knee surgery is on 08/20/10 at 12pm. When okay with MD, restart Coumadin AND Lovenox.  Continue Lovenox 150mg  once daily.  Take 1 1/2 tablets x 2 days then resume normal dose.  Recheck INR at Dr. Frederik Pear office on  08/23/10

## 2010-10-26 ENCOUNTER — Encounter: Payer: Self-pay | Admitting: Internal Medicine

## 2010-10-28 LAB — BASIC METABOLIC PANEL
Chloride: 105 mEq/L (ref 96–112)
GFR calc Af Amer: >60 mL/min (ref >60)
GFR calc non Af Amer: >60 mL/min (ref >60)
Potassium: 4 mEq/L (ref 3.5–5.1)
Sodium: 140 mEq/L (ref 135–145)

## 2010-10-28 LAB — PROTIME-INR: INR: 1.19 (ref 0.00–1.49)

## 2010-10-28 LAB — CBC
HCT: 38.2 % — ABNORMAL LOW (ref 39.0–52.0)
Hemoglobin: 13.1 g/dL (ref 13.0–17.0)
RBC: 3.86 MIL/uL — ABNORMAL LOW (ref 4.22–5.81)
RDW: 13.3 % (ref 11.5–15.5)
WBC: 5.1 10*3/uL (ref 4.0–10.5)

## 2010-11-09 ENCOUNTER — Other Ambulatory Visit: Payer: Self-pay | Admitting: Internal Medicine

## 2010-11-15 ENCOUNTER — Ambulatory Visit (INDEPENDENT_AMBULATORY_CARE_PROVIDER_SITE_OTHER): Payer: Medicare Other | Admitting: *Deleted

## 2010-11-15 DIAGNOSIS — I82409 Acute embolism and thrombosis of unspecified deep veins of unspecified lower extremity: Secondary | ICD-10-CM

## 2010-11-15 DIAGNOSIS — Z7901 Long term (current) use of anticoagulants: Secondary | ICD-10-CM

## 2010-11-15 LAB — POCT INR: INR: 4.1

## 2010-11-15 NOTE — Patient Instructions (Addendum)
Per Hop new regimen- 2.5 today then 5mg  M,W,F then 2.5mg  all other days recheck 4 weeks  Chemira Yetta Barre

## 2010-11-18 ENCOUNTER — Encounter (HOSPITAL_BASED_OUTPATIENT_CLINIC_OR_DEPARTMENT_OTHER): Payer: Medicare Other | Admitting: Oncology

## 2010-11-18 ENCOUNTER — Other Ambulatory Visit: Payer: Self-pay | Admitting: Oncology

## 2010-11-18 DIAGNOSIS — D689 Coagulation defect, unspecified: Secondary | ICD-10-CM

## 2010-11-18 DIAGNOSIS — I2699 Other pulmonary embolism without acute cor pulmonale: Secondary | ICD-10-CM

## 2010-11-18 DIAGNOSIS — D6859 Other primary thrombophilia: Secondary | ICD-10-CM

## 2010-11-18 DIAGNOSIS — Z86718 Personal history of other venous thrombosis and embolism: Secondary | ICD-10-CM

## 2010-11-18 LAB — CBC WITH DIFFERENTIAL/PLATELET
BASO%: 0.6 % (ref 0.0–2.0)
Basophils Absolute: 0 10*3/uL (ref 0.0–0.1)
EOS%: 2.1 % (ref 0.0–7.0)
HGB: 14.2 g/dL (ref 13.0–17.1)
MCH: 32.9 pg (ref 27.2–33.4)
MONO#: 0.4 10*3/uL (ref 0.1–0.9)
NEUT#: 2 10*3/uL (ref 1.5–6.5)
RDW: 13.3 % (ref 11.0–14.6)
WBC: 4.8 10*3/uL (ref 4.0–10.3)
lymph#: 2.3 10*3/uL (ref 0.9–3.3)

## 2010-11-18 LAB — PROTIME-INR: INR: 2.7 (ref 2.00–3.50)

## 2010-11-23 LAB — DIFFERENTIAL
Basophils Absolute: 0 10*3/uL (ref 0.0–0.1)
Basophils Absolute: 0 10*3/uL (ref 0.0–0.1)
Basophils Absolute: 0 10*3/uL (ref 0.0–0.1)
Basophils Relative: 0 % (ref 0–1)
Basophils Relative: 0 % (ref 0–1)
Eosinophils Absolute: 0.1 10*3/uL (ref 0.0–0.7)
Eosinophils Absolute: 0.1 10*3/uL (ref 0.0–0.7)
Eosinophils Relative: 1 % (ref 0–5)
Eosinophils Relative: 2 % (ref 0–5)
Lymphocytes Relative: 26 % (ref 12–46)
Monocytes Absolute: 0.6 10*3/uL (ref 0.1–1.0)
Neutro Abs: 6.6 10*3/uL (ref 1.7–7.7)
Neutrophils Relative %: 73 % (ref 43–77)

## 2010-11-23 LAB — CBC
HCT: 35.4 % — ABNORMAL LOW (ref 39.0–52.0)
HCT: 36 % — ABNORMAL LOW (ref 39.0–52.0)
HCT: 36.3 % — ABNORMAL LOW (ref 39.0–52.0)
Hemoglobin: 12.6 g/dL — ABNORMAL LOW (ref 13.0–17.0)
Hemoglobin: 13.6 g/dL (ref 13.0–17.0)
MCHC: 34.5 g/dL (ref 30.0–36.0)
MCHC: 34.5 g/dL (ref 30.0–36.0)
MCHC: 35 g/dL (ref 30.0–36.0)
MCHC: 35 g/dL (ref 30.0–36.0)
MCV: 97.9 fL (ref 78.0–100.0)
MCV: 98.4 fL (ref 78.0–100.0)
Platelets: 106 10*3/uL — ABNORMAL LOW (ref 150–400)
Platelets: 93 10*3/uL — ABNORMAL LOW (ref 150–400)
Platelets: 93 10*3/uL — ABNORMAL LOW (ref 150–400)
RDW: 12.3 % (ref 11.5–15.5)
RDW: 12.6 % (ref 11.5–15.5)
RDW: 12.6 % (ref 11.5–15.5)
RDW: 12.8 % (ref 11.5–15.5)
RDW: 12.9 % (ref 11.5–15.5)

## 2010-11-23 LAB — PROTEIN C ACTIVITY: Protein C Activity: 93 % (ref 75–133)

## 2010-11-23 LAB — PROTIME-INR
INR: 1.1 (ref 0.00–1.49)
Prothrombin Time: 13.7 seconds (ref 11.6–15.2)

## 2010-11-23 LAB — URINALYSIS, ROUTINE W REFLEX MICROSCOPIC
Bilirubin Urine: NEGATIVE
Ketones, ur: NEGATIVE mg/dL
Nitrite: NEGATIVE
Specific Gravity, Urine: 1.016 (ref 1.005–1.030)
Urobilinogen, UA: 1 mg/dL (ref 0.0–1.0)

## 2010-11-23 LAB — LUPUS ANTICOAGULANT PANEL: PTT Lupus Anticoagulant: 45.5 secs (ref 36.3–48.8)

## 2010-11-23 LAB — BASIC METABOLIC PANEL
CO2: 27 mEq/L (ref 19–32)
Calcium: 9.4 mg/dL (ref 8.4–10.5)
Creatinine, Ser: 0.79 mg/dL (ref 0.4–1.5)
Glucose, Bld: 128 mg/dL — ABNORMAL HIGH (ref 70–99)

## 2010-11-23 LAB — HEPARIN LEVEL (UNFRACTIONATED)
Heparin Unfractionated: 0.24 IU/mL — ABNORMAL LOW (ref 0.30–0.70)
Heparin Unfractionated: 0.29 IU/mL — ABNORMAL LOW (ref 0.30–0.70)
Heparin Unfractionated: 0.48 IU/mL (ref 0.30–0.70)

## 2010-11-23 LAB — PROTHROMBIN GENE MUTATION

## 2010-11-23 LAB — PROTEIN C, TOTAL: Protein C, Total: 92 % (ref 70–140)

## 2010-11-23 LAB — HEPATIC FUNCTION PANEL
AST: 29 U/L (ref 0–37)
Albumin: 3.7 g/dL (ref 3.5–5.2)
Bilirubin, Direct: 0.3 mg/dL (ref 0.0–0.3)

## 2010-11-23 LAB — PROTEIN S, TOTAL: Protein S Ag, Total: 105 % (ref 70–140)

## 2010-11-23 LAB — HOMOCYSTEINE: Homocysteine: 6.5 umol/L (ref 4.0–15.4)

## 2010-11-23 LAB — BETA-2-GLYCOPROTEIN I ABS, IGG/M/A
Beta-2 Glyco I IgG: <20 U/mL
Beta-2-Glycoprotein I IgM: <10

## 2010-12-16 ENCOUNTER — Other Ambulatory Visit: Payer: Medicare Other

## 2010-12-16 ENCOUNTER — Ambulatory Visit (INDEPENDENT_AMBULATORY_CARE_PROVIDER_SITE_OTHER): Payer: Medicare Other | Admitting: *Deleted

## 2010-12-16 DIAGNOSIS — Z7901 Long term (current) use of anticoagulants: Secondary | ICD-10-CM

## 2010-12-16 DIAGNOSIS — D689 Coagulation defect, unspecified: Secondary | ICD-10-CM

## 2010-12-16 LAB — POCT INR: INR: 1.5

## 2010-12-16 NOTE — Patient Instructions (Signed)
Pt informed that per Hop take 7.5mg  today then resume 2.5mg  T,Th,Sat and 5mg  M ,W,F,Sun. Recheck in 2weeks.

## 2010-12-19 ENCOUNTER — Other Ambulatory Visit: Payer: Self-pay | Admitting: *Deleted

## 2010-12-19 MED ORDER — WARFARIN SODIUM 5 MG PO TABS: 5.0000 mg | ORAL_TABLET | Freq: Every day | ORAL | Status: DC

## 2010-12-30 ENCOUNTER — Ambulatory Visit (INDEPENDENT_AMBULATORY_CARE_PROVIDER_SITE_OTHER): Payer: Medicare Other | Admitting: *Deleted

## 2010-12-30 DIAGNOSIS — D689 Coagulation defect, unspecified: Secondary | ICD-10-CM

## 2010-12-30 DIAGNOSIS — Z7901 Long term (current) use of anticoagulants: Secondary | ICD-10-CM

## 2010-12-30 NOTE — Patient Instructions (Signed)
Per Hop change to 5mg  daily except 2.5mg  on T, TH recheck in 3 weeks pt aware of instructions.

## 2010-12-31 NOTE — Consult Note (Signed)
Patrick Hatfield, Patrick [name]   MEDICAL RECORD NO.:  [id number]          PATIENT TYPE:  OBV   LOCATION:  1433                         FACILITY:  [facility]   PHYSICIAN:  Adolph Pollack, M.D.DATE OF BIRTH:  1935/06/06   DATE OF CONSULTATION:  DATE OF DISCHARGE:                                 CONSULTATION   REASON FOR CONSULTATION:  Abdominal pain, superior mesenteric vein  thrombosis.   HISTORY:  This is a 76 year old male in his normal state of health until  about five days ago when he began having waves of mild, pressure-type  epigastric pain.  This is associated with some diarrhea but without  blood.  The pain progressively worsened over the ensuing days and was  not associated with a fever, chills, or nausea and vomiting.  The  diarrhea subsided.  However, because of the intensity of the pain mostly  in the epigastrium, right upper quadrant, and left upper quadrant, he  presented to Dr. Frederik Pear office and was admitted to the hospital.  He  subsequently underwent a CT scan which demonstrated a partially  occluding thrombus in the superior mesenteric vein extending to the  portal vein.  There was a focal limb of small bowel in the right upper  quadrant with surrounding bowel and mesenteric edema but no pneumatosis  or evidence of infarction.  It was for this reason I was asked to see  him.   PAST MEDICAL HISTORY:  1. DVT and pulmonary embolism, 1991.  2. Hypertension.  3. Gilbert's disease.  4. Benign prostatic hypertrophy.  5. Colitis.  6. Drug overdose.  7. Depression.  8. Hyperlipidemia.  9. Gastroesophageal reflux disease.  10.Arthritis.   PAST SURGICAL HISTORY:  1. TURBT.  2. Tonsillectomy.  3. Knee arthroscopy.   ALLERGIES:  LOTREL.   CURRENT MEDICATIONS:  Unasyn, Dilantin, heparin, and p.r.n. Zofran.  At  home he takes Wellbutrin, Campral, omeprazole, aspirin, folic acid,  vitamin D, Centrum vitamin, Benicar/HCTZ, propranolol,  gabapentin, fish  oil.   SOCIAL HISTORY:  He is married and his wife is with him in the room.  He  is a former smoker and does drink some alcohol.   REVIEW OF SYSTEMS:  CARDIAC:  He has no known heart disease or  dysrhythmia.  PULMONARY:  He has got no dyspnea.  GI:  He has no  hematochezia.   PHYSICAL EXAMINATION:  GENERAL:  An overweight male in no acute  distress.  VITAL SIGNS:  Temperature is 98.2, blood pressure is 129/84, pulse is  75.  NECK:  Supple without masses.  CARDIOVASCULAR:  Regular rate, regular rhythm.  Good pedal pulses.  There are some mild venous stasis changes bilaterally, left greater than  right.  His lungs are clear to auscultation.  ABDOMEN:  Soft with some mild right upper quadrant tenderness and  hypoactive bowel sounds.  There is an umbilical hernia that is not  reducible.   LABORATORY DATA:  White cell count normal at 9000 with no left shift.  Hemoglobin 14.9, platelet count 103,000.  INR normal.  Amylase 43,  lipase 16.  Total bilirubin is 2.9.   CT scan reviewed and also shows an umbilical hernia containing only  omentum.   IMPRESSION:  Superior mesenteric vein thrombosis that is subacute.  Typical causes could include trauma  (although not in his history),  hypercoagulable state (fairly likely), portal hypertension (he has a  fatty liver documented in the past), and medications (particularly  estrogen).  Currently he does not have an acute abdomen or signs for  need for emergency laparotomy.   PLAN:  Currently I agree with IV heparin antibiotics.  Hypercoagulable  panel has been ordered.  Aggressive hydration.  Attempt nonoperative  management.  Whether he has operative or nonoperative management I would  suggest he be on lifelong Coumadin.      Adolph Pollack, M.D.  Electronically Signed     TJR/MEDQ  D:  04/09/2009  T:  04/09/2009  Job:  [id number]   cc:   Iva Boop, MD,FACG  [facility]  [address] [place],  [place] [address]   Titus Dubin. Alwyn Ren, MD,FACP,FCCP  [phone]  [place] [address]

## 2010-12-31 NOTE — Discharge Summary (Signed)
[name], [name]   MEDICAL RECORD NO.:  [id number]          PATIENT TYPE:  INP   LOCATION:  1433                         FACILITY:  [facility]   PHYSICIAN:  Titus Dubin. Hopper, MD,FACP,FCCPDATE OF BIRTH:  1935-02-24   DATE OF ADMISSION:  04/09/2009  DATE OF DISCHARGE:  04/13/2009                               DISCHARGE SUMMARY   ADMITTING DIAGNOSES:  1. Abdominal pain, generalized.  2. Diarrhea.   DISCHARGE DIAGNOSES:  1. Superior mesenteric vein thrombosis, subacute.  2. Mild thrombocytopenia, possibly related to prior alcohol ingestion      chronically.  3. Clotting tendency, most likely due to genetic risk factor  For      details of the history, please see the typed office note, April 09, 2009.   The patient presented with vague pressure, mid upper abdominal  discomfort, April 04, 2009, followed by bowel changes on April 06, 2009.  He developed frank pain on April 07, 2009.  He had not taken any  medications.  He had had some diarrhea without melena or rectal  bleeding.  The pain was diffuse in the epigastrium, right upper  quadrant, and left upper quadrant as well and was constant and sharp  after April 07, 2009.  He had no fever, change in weight, genitourinary  symptoms, chest or cardiopulmonary symptoms.   SIGNIFICANT PAST MEDICAL HISTORY:  Includes:  1. Colitis for which he was hospitalized in his 15s.  2. He also had deep venous thrombosis in 1991 in the context of      multiple [employer] [place].   At the time of admission, he was afebrile.  Bowel sounds were decreased  but present.  The abdomen was soft but tender.  He had progressive pain  while in the office prompting transfer by ambulance to the hospital.   The evaluation included CAT scans of abd & pelvis which revealed the  superior mesenteric vein thrombosis with associated right-sided bowel  edematous changes.   He had been placed on empiric  Cipro and Flagyl because of the history of  colitis.  He also received empiric PPIs.   Upon receipt of the report of the subacute thrombosis, he was placed on  heparin and Coumadin.   Coagulopathy profile was essentially negative except for antithrombin  III of 74 with normals over 75.   He was seen in consult by Dr. Leone Payor of [facility] and by Dr. Avel Peace, general surgeon.  At no time did he exhibit an acute  surgical abdomen.  With the heparin, there was gradual improvement in  symptoms.   By the day of discharge, he was eating without discomfort.  He had had  some loose stools and was passing gas.   He was also seen in consultation by Dr. Cyndie Chime who felt that the  mild thrombocytopenia he exhibited with platelet count as low as 93,000  was probably related to prior alcohol ingestion on a chronic basis.  Mr.  Hatfield has not had alcohol for 3 months.   He was anticoagulated with Coumadin  and on the day of discharge his INR  was 2.1.   Additionally, he was clinically stable on the day of discharge with no  increased work of breathing.  He had no chest or abdominal pain.  Abdomen was nontender.  He was eating well.  Respiratory rate was 20.  Blood pressure 127/72.  Pulse was 65 and regular.  O2 saturations were  99% on room air.   His white count was 4600.  Hematocrit was 36.  As stated, INR was 2.1.   Lifelong anticoagulation will be necessary.  It was recommend by  pharmacy that he receive overlapped Coumadin and Lovenox until the INR  was greater than 2 two consecutive days.  It had steadily risen from 1.2  on April 11, 2009, to 1.7 on April 12, 2009.  On the day of discharge,  stated it was 2.1.  He will be asked to take the Coumadin 5 mg a day and  have a prothrombin time checked on April 16, 2009, at my office.   He will advance his diet as tolerated.  His activities will only be  limited to those which would be of risk as he is on Coumadin.   His  ultimate INR target would be 2 to 3.   DISCHARGE STATUS:  Improved.   PROGNOSIS:  Good.      Titus Dubin. Alwyn Ren, MD,FACP,FCCP  Electronically Signed     WFH/MEDQ  D:  04/13/2009  T:  04/13/2009  Job:  [id number]   cc:   Adolph Pollack, M.D.  [address]  [place]  [place] [address]   Genene Churn. Cyndie Chime, M.D.  Fax: [phone]

## 2010-12-31 NOTE — Consult Note (Signed)
Patrick Hatfield, Patrick [name]   MEDICAL RECORD NO.:  [id number]          PATIENT TYPE:  INP   LOCATION:  1433                         FACILITY:  [facility]   PHYSICIAN:  Genene Churn. Granfortuna, M.D.DATE OF BIRTH:  01/30/1935   DATE OF CONSULTATION:  04/12/2009  DATE OF DISCHARGE:                                 CONSULTATION   This is a hematology consultation requested to evaluate this man for  recurrent thrombosis and mild thrombocytopenia.   Mr. Baumbach is a 76 year old man who initially had an unprovoked left  lower extremity deep venous thrombosis in 1991 while on a business trip  to [place].  He took Coumadin for about 1 year.  As far as he knows, he  never had a pulmonary embolus or any other clotting problems until he  presented to this hospital on April 09, 2009, with a 5-day history of  progressive severe mid and lower abdominal pain.  No associated nausea,  vomiting, diarrhea, hematochezia or melena.  CT scan done in the  emergency department, which I have personally reviewed, shows evidence  for an acute thrombus within the superior mesenteric vein extending into  the main portal vein.  There is likely some mesenteric ischemia with  edema and thickening in an area of small bowel in the right abdomen and  right upper pelvis.  A surgical consultation was obtained but there was  no evidence for an acute abdomen and conservative treatment was  recommended.  He was started on anticoagulation.   Laboratory studies done on admission showed a mild thrombocytopenia with  platelet count 103,000.  Hemoglobin was normal at 14.9 grams, white  count 9000 with 73 neutrophils, 18 lymphocytes and 8 monocytes.  Platelet count has been slightly decreased at least documented since  February 06, 2009, when count was 91,000.  I found a platelet count of  147,000 during a 2006 hospital admission.  Of note, the patient is an  admitted alcoholic who drinks up to 8 ounces of  hard liquor daily and  just stopped 3 months ago.   Special hematology studies done during this admission revealed that he  is a heterozygote for the Factor V Leiden gene mutation.  He tests  negative for the prothrombin gene mutation, negative for the presence of  a lupus type anticoagulant, and he has normal total and functional  levels of protein S and protein C.  Antithrombin level is borderline  decreased at 74% of control (75-120) and this was likely drawn after he  had already been started on heparin.  (Therefore, unreliable and will be  repeated when he is off heparin as an outpatient).   CT scan of the abdomen and pelvis on admission showed no other obvious  intra-abdominal pathology.  The liver, spleen and pancreas appeared  normal.  There was no malignant-appearing adenopathy.   He has had no constitutional symptoms.  No change in bowel habit.  No  acute or chronic cough or other respiratory symptoms.  He states he had  a normal colonoscopy about 2 years ago by Dr. Sheryn Bison.  There was no family history of blood clots.  Father died at age 69 of  pancreatic cancer.  Mother lived until age 44 and died of heart failure.  He has an older sister with MS and a younger brother who is healthy.  None of his four daughters age 68-48 have had problems with blood clots.   PAST MEDICAL HISTORY:  Includes:  1. Hypertension.  2. BPH.  3. Hyperlipidemia.  4. Depression with overdose attempt requiring hospitalization back in      2006.  5. No history of MI, diabetes, ulcers, hepatitis, yellow jaundice,      kidney stones, thyroid trouble, seizure or stroke.   Only prior surgery was a TURP by Dr. Retta Diones.   MEDICATIONS ON ADMISSION:  Included:  1. Wellbutrin XL 300 mg daily.  2. Campral 666 mg t.i.d.  3. Prilosec 20 mg daily.  4. Coated aspirin 81 mg daily.  5. Folic acid 400 mg daily.  6. Vitamin D.  7. Multivitamin.  8. Fish Oil capsules daily.  9. Benicar 40/12.5  one daily.  10.Propranolol 40 mg daily taken for benign tremor.  11.Gabapentin 300 mg at bedtime.   No allergies.   FAMILY HISTORY:  As noted above.   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  He does not smoke.  Alcohol 8 ounces daily until 3 months  ago.  Married, 4 healthy daughters with no clotting problems.   REVIEW OF SYSTEMS:  Unremarkable except as noted above.  He has had no  constitutional symptoms, specifically no anorexia or weight loss.  No  change in bowel habit.   PHYSICAL EXAMINATION:  A pleasant Caucasian man 70 inches tall, 98 kg,  blood pressure 141/85, pulse 63 regular, respirations 22, temperature  98.5.  SKIN, HAIR AND NAILS:  Normal.  No ecchymosis, petechiae or rash.  PUPILS:  Equal, reactive to light.  PHARYNX:  No erythema or exudate.  NECK:  Supple.  No thyromegaly or thyroid mass.  LUNGS:  Are clear and resonant to percussion.  Regular cardiac rhythm.  No murmur, gallop or rub.  No cervical, supraclavicular, axillary or  inguinal lymphadenopathy.  ABDOMEN:  Is soft with normal to hyperactive bowel sounds, diffusely  tender primarily right upper quadrant and right lower quadrant.  No  palpable mass or organomegaly.  RECTAL:  Not done.  EXTREMITIES:  No edema.  No calf tenderness.  NEUROLOGIC:  Motor strength 5/5.  Reflexes 2+, symmetric.   LABORATORY ON ADMISSION:  Hemoglobin 14.9, hematocrit 43, white count  9073.  Neutrophils - 18 lymphocytes, 8 monocytes, platelet count  103,000.   Review of the peripheral blood film done today shows normochromic  normocytic red cells and platelets 8-10 per high-powered field, some  very large, occasional benign reactive lymphocytes, otherwise mature  neutrophils and lymphocytes.   Liver functions - On admission, bilirubin 2.9 with a history of  Gilbert's disease.  Previous bilirubin done on February 06, 2009, was 2.2.  Alkaline phosphatase 57, SGOT 59, SGPT 54, albumin 3.7,  total protein  7.7.   IMPRESSION:  1. Acute superior mesenteric vein thrombosis with prior history of      left lower extremity deep vein thrombosis.  Clotting tendency most      likely due to a now newly discovered genetic risk factor - he is a      heterozygote for the Factor V Leiden gene defect.  Recommendation      at this point,  given the recurrent life-threatening clotting, is      lifelong Coumadin anticoagulation.  2. Mild thrombocytopenia.  He had a normal hemoglobin, white count and      differential on admission.  On review of the peripheral blood film      many of the platelets are quite large and the machine will count      than as red cells instead of platelets.  My estimate of his      platelet count from the smear is actually likely higher than      100,000.  I think the most likely reason for his mild      thrombocytopenia is his chronic alcohol use with suppression of      thrombopoietin production in the liver. There are no obvious signs      of cirrhosis and no splenomegaly on abdominal CT scan.   RECOMMENDATION:  No further evaluation.   Thank you for this consultation.  I advised the patient to have his  daughters checked for the Factor V Leiden defect since it may impact on  their use of estrogen and contraceptives as well as taking the  appropriate precautions around any surgical procedures.   Thank you for this consultation.      Genene Churn. Cyndie Chime, M.D.  Electronically Signed     JMG/MEDQ  D:  04/12/2009  T:  04/12/2009  Job:  [id number]   cc:   Titus Dubin. Alwyn Ren, MD,FACP,FCCP  [phone]  [place]  [place] [address]   Adolph Pollack, M.D.  [address]  [place]  [place] [address]   Vania Rea. Jarold Motto, MD, FACG, FACP, FAGA  [address]  [place]  [place] [address]

## 2011-01-03 NOTE — H&P (Signed)
Patrick Hatfield, SHAFF NO.:  [id number]   MEDICAL RECORD NO.:  [id number]          PATIENT TYPE:  IPS   LOCATION:  0306                          [facility]ILITY[facility]  [facility]   PHYSICIAN:  Jeanice Lim, M.D. DATE OF BIRTH:  [birth year]   DATE OF ADMISSION:  03/28/2005  DATE OF DISCHARGE:                         PSYCHIATRIC ADMISSION ASSESSMENT   IDENTIFYING INFORMATION:  This is a 76 year old married white male.  Apparently he was admitted to [facility] August 8 after overdosing on a  variety of prescribed as well as over-the-counter medications.  He was  stabilized under the care of Dr. Alwyn Ren.  He states he is not sure what  happened exactly.  He had had some wine.  He was watching a favorite video.  He began thinking about a grandson who had died 3-1/2 years ago.  Apparently  this grandson was 1/2 of a twin.  He died 2 days after birth.  The little  girl has survived.   The patient has retired.  He used to be in computers.  This past year he had  the care of a 9-year-old grandson and was quite active during the day.  Over  the summer, his grandson returned to the care of his mom and he found  himself drinking a glass of wine here and there which was out of his normal  pattern.  He states he has probably been drinking a little bit too much.   PAST PSYCHIATRIC HISTORY:  He has no prior history.   SOCIAL HISTORY:  He had 2 years of college.  He was self employed as a  Engineer, civil (consulting).  He has been married 45 years.  He has 4 daughters.  Their ages range from 10 to 76.   FAMILY HISTORY:  His oldest daughter has had a substance abuse issue with  laxatives.  She has actually been treated here at [facility] as well as elsewhere.  Other than that, there is no family history.   ALCOHOL AND DRUG ABUSE:  The patient quit smoking in 1991.  He is a social  drinker.  He claims that normally he goes weeks at a time without drinking  at all.   PAST MEDICAL HISTORY:  Primary care  Roma Bondar is Dr. Alwyn Ren.  Medical  problems:  He is treated for hypertension, hypercholesterolemia, and he is  obese.  His currently prescribed medications are hydrochlorothiazide 12.5 mg  p.o. q.a.m., propranolol 40 mg p.o. q.a.m., baby aspirin 81 mg p.o. q.a.m.  He was apparently started on Wellbutrin XL over in the main hospital, 150 mg  p.o. q.a.m.  He takes Benicar 40 mg p.o. daily, Prilosec over-the-counter 20  mg p.o. daily, and folic acid 800 mg p.o. daily and Centrum Silver  multivitamin.  He also takes fish oil 2000 mg p.o. daily.   ALLERGIES:  No known drug allergies.   POSITIVE PHYSICAL FINDINGS:  PHYSICAL EXAMINATION:  His CIWA has been 0 to 2  here upon admission.  His physical examination is otherwise well documented  and on the chart.  His vital signs have been stable, with no unusual  abnormalities.  His vital signs this morning were 151/91, 81, 20, 96 and 18.  His weight was 225.   MENTAL STATUS EXAM:  He is alert and oriented x3.  He is well groomed,  nourished, appropriately dressed.  His speech was not pressured.  His mood  and affect were appropriate to the situation.  His thought processes are  clear, rational and goal oriented.  He is quite remorseful that he has put  his wife and daughters through all this.  Judgment and insight are intact.  Concentration and memory are intact.  He denies suicidal or homicidal  ideation.  He denies auditory or visual hallucinations.  He states that his  brother has encouraged him to look at AA in the past and he is willing to do  so.  He will be doing that here in the hospital and we also discussed  Campral, starting that to help him come off of the alcohol.   ADMISSION DIAGNOSES:  AXIS I:  Alcohol dependence without physiologic  dependence, alcohol induced mood disorder.  AXIS II:  Deferred.  AXIS III:  Hypertension, hypercholesterolemia, obesity, status post DVT in  1991.  AXIS IV:  Alcohol.  AXIS V:  Global assessment of  function is 35.   PLAN:  Plan is to admit for further stabilization and safety, to help him  through withdrawal.  So far, he is not really exhibiting any alcohol  withdrawal.  Toward that end, he was started on the Librium protocol.  He  was instructed that if he does not feel he needs it he does not need to take  it.  In addition to the Wellbutrin that was already started, we will start  him on some Campral.       MD/MEDQ  D:  03/29/2005  T:  03/29/2005  Job:  [id number]

## 2011-01-03 NOTE — Discharge Summary (Signed)
NAMEGRANVILLE, WHITEFIELD NO.:  [id number]   MEDICAL RECORD NO.:  [id number]          PATIENT TYPE:  INP   LOCATION:  6705                         FACILITY:  [facility]   PHYSICIAN:  Thomos Lemons, D.O. [facility]   DATE OF BIRTH:  [birth year]   DATE OF ADMISSION:  03/25/2005  DATE OF DISCHARGE:  03/28/2005                                 DISCHARGE SUMMARY   DISCHARGE DIAGNOSES:  1.  Drug overdose.  2.  Fatty liver.  3.  Gilbert's syndrome.   PAST MEDICAL HISTORY:  1.  Hypertension.  2.  High cholesterol.  3.  Gilbert's syndrome.  4.  Increased LFTs secondary to fatty liver.  5.  History of DVT in the lower extremity in 1991.   HISTORY OF PRESENT ILLNESS:  The patient is a 76 year old male who was  admitted after the patient's wife noticed that he was extremely sleepy.  She  noticed that he had multiple empty drug bottles on the floor.  The following  bottles were noticed by the patient's wife surrounding the patient:  (1)  clonazepam 0.5 mg, 30 pills dispensed in June of 2006; (3) Guaifenesin LA in  a bottle that was dispensed in 2002; (3) propranolol 40 mg, dispensed in the  previous month with 90 pills; (4) trazodone which was dispensed in 2002; (5)  a bottle of DayQuil.  The patient stated that he took all of the above  medications.  According to the wife, he has never suffered from any  depression or suicidal ideas, even though he has been feeling down lately  secondary to finances, his retirement, and other things.   HOSPITAL COURSE:  Problem 1.  Drug overdose.  The patient was admitted and  underwent charcoal in the emergency room.  He was admitted to the ICU for  close monitoring.  In addition, he was placed under suicidal watch with a  sitter.  It was also noted that the patient was intoxicated with alcohol.  The patient was stabilized and transferred to a floor bed.   A psychiatry consult was obtained in which the patient was diagnosed with a  mood disorder and  rule out alcohol dependence as well as an anxiety  disorder.  The patient is agreeable for transfer to inpatient psych.  It is  felt that he is still at risk, and we are in the process of making  arrangements for transfer.   DISCHARGE MEDICATIONS:  1.  Lovenox 40 mg subcutaneously daily for DVT prophylaxis.  2.  Hydrochlorothiazide 12.5 mg p.o. daily.  3.  Wellbutrin 150 mg p.o. daily.  4.  Senokot one tablet p.o. q.h.s. p.r.n.   LABORATORY DATA AT DISCHARGE:  Hemoglobin 14.4, hematocrit 41.8, white blood  cell count 5.1, platelets 147.  BUN 8, creatinine 0.8.  TSH 1.526.  B12 386.  RPR negative.  AST 61, ALT 90.   FOLLOW UP:  After the patient is discharged from the inpatient psych unit,  he will need to have followup arranged with Dr. Marga Melnick of [facility],  the patient's primary care.      Melissa S.  Peggyann Juba, NP      Thomos Lemons, D.O. [facility]  Electronically Signed    MSO/MEDQ  D:  03/28/2005  T:  03/28/2005  Job:  [id number]   cc:   Titus Dubin. Alwyn Ren, M.D. [facility]  [phone] [place]  [place] [address]

## 2011-01-03 NOTE — H&P (Signed)
NAME:  Patrick Hatfield, Patrick Hatfield NO.:  [id number]   MEDICAL RECORD NO.:  [id number]          PATIENT TYPE:  EMS   LOCATION:  MAJO                         FACILITY:  [facility]   PHYSICIAN:  Wanda Plump, MD [facility]    DATE OF BIRTH:  06-30-1935   DATE OF ADMISSION:  03/25/2005  DATE OF DISCHARGE:                                HISTORY & PHYSICAL   ADDENDUM:  The patient is also intoxicate with alcohol.  It is possible that  he will go into delirium tremens if he has been drinking excessively over  the last few days or weeks.  This will also be monitored in the ICU.      Wanda Plump, MD [facility]  Electronically Signed     JEP/MEDQ  D:  03/25/2005  T:  03/26/2005  Job:  [id number]

## 2011-01-03 NOTE — Discharge Summary (Signed)
Patrick Hatfield, EDMONSTON NO.:  [id number]   MEDICAL RECORD NO.:  [id number]          PATIENT TYPE:  INP   LOCATION:  6705                         FACILITY:  [facility]   PHYSICIAN:  Wanda Plump, MD [facility]    DATE OF BIRTH:  1934-10-20   DATE OF ADMISSION:  03/25/2005  DATE OF DISCHARGE:  03/28/2005                                 DISCHARGE SUMMARY   ADDENDUM:   PHYSICAL EXAMINATION:  VITAL SIGNS:  Blood pressure 133/80, heart rate 67,  respiratory rate 20, temperature 97.8, O2 95% on room air.  GENERAL:  Patient is sitting up in bed in no acute distress.  CARDIOVASCULAR:  S1, S2.  Regular rate and rhythm.  LUNGS:  Clear to auscultation bilaterally.  No wheezes, rales, or rhonchi.  ABDOMEN:  Soft.  EXTREMITIES:  No edema.  NEUROLOGIC:  Awake, alert.  Moving all extremities.      Melissa S. Peggyann Juba, NP      Wanda Plump, MD [facility]  Electronically Signed    MSO/MEDQ  D:  03/28/2005  T:  03/28/2005  Job:  [id number]

## 2011-01-03 NOTE — Discharge Summary (Signed)
Patrick Hatfield, Patrick Hatfield               ACCOUNT NO.:  [id number]   MEDICAL RECORD NO.:  [id number]          PATIENT TYPE:  IPS   LOCATION:  0306                          FACILITY:  [facility]   PHYSICIAN:  Syed T. Arfeen, M.D.   DATE OF BIRTH:  [birth year]   DATE OF ADMISSION:  03/28/2005  DATE OF DISCHARGE:  03/31/2005                                 DISCHARGE SUMMARY   IDENTIFYING DATA:  The patient is a 76 year old white married male.  The  patient was admitted to [facility] on August 8 after overdose on a variety  of prescribed medicine, as well as over-the-counter medications.  The  patient was stabilized under the care of Dr. Clearance Coots.  The patient was not  sure what happened exactly, though he admitted that he had some wine and he  was watching his favorite video, and he was thinking about his grandson who  had died 3-1/2 years ago.  Apparently his grandson was one of a twin and he  died 2 days after the birth.  The little girl survived.  The patient is  retired and used to be in [employer] and has been very active during his life  in daily activities.  Over the summer, his grandson returned to the care of  his mom and he found himself drinking a glass of wine, which was out of his  normal background.  The patient admitted that he has been drinking probably  too much, though he denies any suicidal thoughts or other attempts.   PAST PSYCHIATRIC HISTORY:  None.   ALCOHOL AND DRUG HISTORY:  The patient quit smoking in 1991.  He is a social  drinker.  He claims that he normally goes a week at a time without drinking  at all.   PAST MEDICAL HISTORY:  The patient's primary care physician is Dr. Clearance Coots.  His medical problems are hypertension, hypercholesterolemia, and he is  obese.  He has been prescribed hydrochlorothiazide, propranolol, baby  aspirin, and he was apparently started on Wellbutrin XL in the main  hospital.  The dose was 150 mg q.a.m.  He also takes Prilosec over-the-  counter,  folic acid and Centrum Silver.   ALLERGIES:  No known allergies.   PHYSICAL EXAMINATION:  The patient's CIWA was 0-2 upon admission.  His  physical examination was well done and documented in the chart before  transfer from the inpatient hospital.   MENTAL STATUS EXAM:  Alert, oriented, well groomed, well nourished,  appropriately dressed.  Speech was normal tone.  Mood and affect were  appropriate.  Thought processes logical, goal directed.  He appears to be  quite remorseful for what he put his wife and daughter through all this, and  judgment and insight are intact, concentration and memory good.  Denies any  suicidal or homicidal thoughts.  Denied any hallucinations, but he stated  that he needed some help during this crisis.   ADMISSION DIAGNOSES:  AXIS I:  Alcohol dependence with psychological  dependence and physiological dependence, alcohol induced mood disorder.  AXIS II:  Deferred.  AXIS III:  Hypertension, hypercholesterolemia,  obesity.  AXIS IV:  Remember the death of his grandson.  AXIS V:  Global assessment of function is 35.   HOSPITAL COURSE:  The patient was admitted and placed on a safety check.  The patient was started on low-dose Librium protocol.  He was closely  monitored for detox and withdrawal symptoms, closely observed for his vitals  and medications were restarted, which are Wellbutrin XL 150 mg q.a.m.,  Campral 330 mg 2 tablets t.i.d., propranolol 40 mg q.a.m. Benicar 40 mg a  day, aspirin 81 mg a day, hydrochlorothiazide 12.5 mg a day.  The patient  was closely monitored for any withdrawals.  The patient continued to address  that he is not sure why he did it, but then he did endorse that he has been  depressed secondary to financial reasons and admits he has been drinking a  lot.  A family session was requested with the wife.  He was encouraged to  participate in individual, group and milieu therapy.  He was seen with some  mild tremors and shakes, but  overall he started to show some improvement.  He reported no suicidal thoughts and liked the group and milieu therapy.  He  reported no acute side effects with the Wellbutrin and felt better with the  antidepressant regime.  He was seen verbal, social and active in the groups.  Discharge planning was discussed in detail.  On August 14, family session  with the wife was done which went very well.  The patient denied any  suicidal thoughts, homicidal thoughts and wife reported securing the guns  and destroying the old medications.  She appears to be very supportive, and  the patient reported that he has a plan to attend AA and CDIOP and follow up  with a psychiatrist.  He reported his mood has been euthymic, affect bright,  thought processes logical and goal directed.  Denies any auditory  hallucinations, suicidal ideation, homicidal ideation, reported no shakes,  tremors or any withdrawal symptoms.  He reported he felt better with  medication and had increased coping and social skills.  He felt that he was  ready to be discharged.   DISCHARGE DIAGNOSES:  AXIS I:  Depressive disorder not otherwise specified,  alcohol dependence, rule out alcohol induced mood disorder.  AXIS II:  Deferred.  AXIS III:  Hypertension, hypercholesterolemia, obesity.  AXIS IV:  Memories of his grandson.  AXIS V:  Global assessment of function is 60.   DISCHARGE MEDICATIONS:  1.  Wellbutrin XL 150 mg one once a day.  2.  Campral 666 mg 3 times a day.  3.  Librium 25 mg one at bedtime, one night only.  4.  Hydrochlorothiazide 12.5 one each day.  5.  Inderal 40 mg each morning.  6.  Avapro 300 mg each morning.  7.  Prilosec 200 mg each morning.   DISPOSITION:  The patient was discharged to [facility] on August 7 at  2:30 p.m., phone number [phone].  The patient is also scheduled to see  Veneta Penton, social worker, at [phone] on August 17 at 11 a.m.  The patient also has shown some interest in CDIOP  program.      Kathi Ludwig T. Lolly Mustache, M.D.  Electronically Signed     STA/MEDQ  D:  04/11/2005  T:  04/11/2005  Job:  [id number]

## 2011-01-03 NOTE — H&P (Signed)
NAMEADDAM, GOELLER NO.:  [id number]   MEDICAL RECORD NO.:  [id number]          PATIENT TYPE:  EMS   LOCATION:  MAJO                         FACILITY:  [facility]   PHYSICIAN:  Wanda Plump, MD [facility]    DATE OF BIRTH:  September 10, 1934   DATE OF ADMISSION:  03/25/2005  DATE OF DISCHARGE:                                HISTORY & PHYSICAL   PRIMARY CARE PHYSICIAN:  Titus Dubin. Alwyn Ren, M.D.   CHIEF COMPLAINT:  Drug overdose.   HISTORY OF PRESENT ILLNESS:  Mr. Redder is a 76 year old white male who was  doing well until after dinner when his wife noticed that he was sleepy.  She  checked on him an hour later, and he continued to be drowsy and sleepy, and  then she noticed empty bottles on the floor.  She realized that the patient  overdosed himself.  The patient's wife brought the following bottles to the  emergency room that she found around her husband - (1) clonazepam 0.5 mg, 30  pills dispensed in June 2006, (2) guaifenesin LA in a bottle that was  dispensed in 2002 (we do not know how many were left), (3) propranolol 40  mg, dispensed last month with 90 pills, (4) trazodone, which was dispensed  in 2002, (5) a bottle of DayQuil.   The patient states that he took then all.  The wife states that he never  had suffered from depression or suicidal ideas, even though he has been  feeling down lately secondary to finances, his retirement, among other  things.  Today, the patient did mention the loss of a grandchild 4 years  ago.   PAST MEDICAL HISTORY:  1.  Hypertension.  2.  High cholesterol.  3.  Gilbert's syndrome.  4.  Increased LFT's, which were elevated as an outpatient recently.  Did an      ultrasound of the liver that showed fatty infiltrate.  5.  History of a DVT in his leg in 1991.  (No history of diabetes or coronary artery disease.).   FAMILY HISTORY:  Noncontributory.   SOCIAL HISTORY:  He is retired.  He quit tobacco in 1991 after his DVT.  He  drinks  wine daily; unclear to me how much he drinks.   REVIEW OF SYSTEMS:  The patient and the wife deny any recent or present  chest pain, shortness of breath, headache, nausea, or vomiting.  The patient  did mention that he contemplated using one of his guns to kill himself.   MEDICATIONS:  I do not have a list of his medicines, but obviously he has  been recently prescribed propranolol and clonazepam.   ALLERGIES:  No known drug allergies.   PHYSICAL EXAMINATION:  GENERAL:  The patient is sleepy but arousable.  He  seems oriented to person.  He is not oriented to place.  He thought he was  at [facility].  He is, however, cooperative.  VITAL SIGNS:  The patient is afebrile.  Blood pressure 109/57, respirations  24, heart rate 75.  NECK:  No JVD.  LUNGS:  Clear to auscultation bilaterally.  CARDIOVASCULAR:  Regular rate and rhythm without a murmur.  ABDOMEN:  Not distended, soft, good bowel sounds, and no organomegaly.  EXTREMITIES:  There is no edema.  NEUROLOGIC:  Neck has full range of motion.  Motor is symmetric.  Pupils are  around 2 mm, and somehow hyporeactive but symmetric.  Extraocular movements  are intact.  On extreme deviation of the gaze, he does have some nystagmus  that abates around 6-10 beats.   LABORATORY AND X-RAYS:  Acetaminophen level is 24.4, which is not in the  toxic range.  Salicylate level is 4.0, which is negative.  Alcohol level is  75, which is high.  Sodium is 140, hemoglobin 14.3.   ASSESSMENT AND PLAN:  The patient has been admitted with a drug overdose  consisting, as far as we can tell, of propranolol, clonazepam, trazodone,  and guaifenesin.  He also took some DayQuil.  Potentially, he may have taken  close to 70-80 propranolol pills, but it is very hard to guess how many of  the other pills he took.  At this point, he already received charcoal in the  emergency room.  He will be admitted to the ICU for close monitoring of his  vital signs  and mental status.  He is going to be under suicidal watch.  We  plan to consult psychiatry in the morning.  The [facility] was  contacted today, and they recommend glucagon 3 to 5 mg IV with a drip of 1  to 5 mg per hour antidote for the propranolol, according to the nurse's note  who called the poison center.  Because he has a history of a DVT in 1991, I  will start him on Lovenox tonight.      Wanda Plump, MD [facility]  Electronically Signed     JEP/MEDQ  D:  03/25/2005  T:  03/26/2005  Job:  [id number]   cc:   Titus Dubin. Alwyn Ren, M.D. [facility]  [phone] [place]  [place] [address]

## 2011-01-03 NOTE — Op Note (Signed)
Patrick Hatfield, Patrick Hatfield               ACCOUNT NO.:  [id number]   MEDICAL RECORD NO.:  [id number]          PATIENT TYPE:  AMB   LOCATION:  DAY                          FACILITY:  [facility]   PHYSICIAN:  Marlowe Kays, M.D.  DATE OF BIRTH:  1935-01-05   DATE OF PROCEDURE:  08/28/2006  DATE OF DISCHARGE:                               OPERATIVE REPORT   PREOP DIAGNOSES:  1. Torn medial meniscus.  2. Osteoarthritis right knee.   POSTOP DIAGNOSES:  1. Torn medial and lateral menisci.  2. Osteoarthritis right knee.   OPERATION:  Right knee arthroscopy with  1. Partial medial lateral meniscectomy.  2. Shaving of bilateral tibial plateau medial femoral condyle and      patella.   SURGEON:  Marlowe Kays, M.D.   ASSISTANT:  Nurse.   ANESTHESIA:  General.   PATHOLOGIST:  __________.   DESCRIPTION OF PROCEDURE:  Because of painful right knee, I obtained an  MRI on 07/01/2006 which showed extensive complex tear of the medial  meniscus as well as osteoarthritis.  His pain has actually gotten a  little worse recently; and accordingly, he is here today for the above-  mentioned surgery.  See operative description for details.   DESCRIPTION OF PROCEDURE:  Satisfactory general anesthesia, pneumatic  tourniquet; right leg was Esmarch out nonsterilely.  Thigh stabilizer  applied, knee support and Ace wrap to the left lower extremity.  The  right leg was prepped with DuraPrep from stabilizer-to-ankle and draped  in a sterile field.  Superior medial saline inflow, first through an  anterior medial portal, the lateral compartment of the joint was  evaluated.  He had chondrocalcinosis with some degeneration of the  lateral tibial plateau, articular cartilage.  He also had some fairly  significant fraying of the inner border of the lateral meniscus, the  posterior two-thirds.  I debrided down the lateral meniscus with a  combination of baskets and a 3.5 shaver; and also debrided down the  lateral tibial plateau.  I then reversed portals.  She had a progression  of the disruption of the MRI finding of the medial meniscus with the  residual portion of the tear anteriorly; and extensive tearing  throughout the posterior mid third going to the intercondylar area.  The  meniscus posteriorly also, once again, had chondral calcinosis with a  great 2-3/4 chondromalacia of the medial femoral condyle.  With baskets,  I debrided back the meniscus posteriorly to stable rim; and shaved it  down to a smooth 3.5 shaver; and also shaved down the medial femoral  condyle.  Anteriorly he was found to have complete disruption of the  anterior portion of the medial meniscus which I sectioned at its  posterior junction to the mid third and then resected both areas with a  3.5 shaver.  Final pictures were taken, looking up the medial gutter and  suprapatellar area, the patella had some moderate wear which I debrided  down as well.  The knee joint was then irrigated until clear and all  fluid possible removed.  The traverse portals were closed with 4-0  nylon.  I injected through the inflow apparatus 20 mL of 1/2% Marcaine  with adrenalin, and 4 mg of morphine; and then closed this portal with 4-  0 nylon as well.  Betadine, adaptic, and dry sterile dressings were  applied.  Tourniquet was released. He tolerated the procedure well; and  was taken to the recovery room in satisfactory condition, with no known  complications.           ______________________________  Marlowe Kays, M.D.     JA/MEDQ  D:  08/28/2006  T:  08/28/2006  Job:  [id number]

## 2011-01-22 ENCOUNTER — Ambulatory Visit (INDEPENDENT_AMBULATORY_CARE_PROVIDER_SITE_OTHER): Payer: Medicare Other | Admitting: Internal Medicine

## 2011-01-22 ENCOUNTER — Encounter: Payer: Self-pay | Admitting: Internal Medicine

## 2011-01-22 DIAGNOSIS — R1011 Right upper quadrant pain: Secondary | ICD-10-CM

## 2011-01-22 DIAGNOSIS — K219 Gastro-esophageal reflux disease without esophagitis: Secondary | ICD-10-CM

## 2011-01-22 DIAGNOSIS — I10 Essential (primary) hypertension: Secondary | ICD-10-CM

## 2011-01-22 DIAGNOSIS — Z7901 Long term (current) use of anticoagulants: Secondary | ICD-10-CM

## 2011-01-22 DIAGNOSIS — D689 Coagulation defect, unspecified: Secondary | ICD-10-CM

## 2011-01-22 DIAGNOSIS — M25579 Pain in unspecified ankle and joints of unspecified foot: Secondary | ICD-10-CM

## 2011-01-22 DIAGNOSIS — D682 Hereditary deficiency of other clotting factors: Secondary | ICD-10-CM

## 2011-01-22 DIAGNOSIS — B029 Zoster without complications: Secondary | ICD-10-CM

## 2011-01-22 DIAGNOSIS — Z125 Encounter for screening for malignant neoplasm of prostate: Secondary | ICD-10-CM

## 2011-01-22 DIAGNOSIS — E785 Hyperlipidemia, unspecified: Secondary | ICD-10-CM

## 2011-01-22 DIAGNOSIS — Z86718 Personal history of other venous thrombosis and embolism: Secondary | ICD-10-CM

## 2011-01-22 DIAGNOSIS — I82409 Acute embolism and thrombosis of unspecified deep veins of unspecified lower extremity: Secondary | ICD-10-CM

## 2011-01-22 DIAGNOSIS — G479 Sleep disorder, unspecified: Secondary | ICD-10-CM

## 2011-01-22 DIAGNOSIS — Z0000 Encounter for general adult medical examination without abnormal findings: Secondary | ICD-10-CM

## 2011-01-22 LAB — LIPID PANEL
Cholesterol: 222 mg/dL — ABNORMAL HIGH (ref 0–200)
HDL: 40 mg/dL (ref >39.00)
Total CHOL/HDL Ratio: 6
Triglycerides: 249 mg/dL — ABNORMAL HIGH (ref 0.0–149.0)

## 2011-01-22 LAB — BASIC METABOLIC PANEL
Calcium: 9.2 mg/dL (ref 8.4–10.5)
Creatinine, Ser: 0.9 mg/dL (ref 0.4–1.5)
GFR: 86.5 mL/min (ref >60.00)
Glucose, Bld: 112 mg/dL — ABNORMAL HIGH (ref 70–99)
Sodium: 137 mEq/L (ref 135–145)

## 2011-01-22 LAB — CBC WITH DIFFERENTIAL/PLATELET
Eosinophils Absolute: 0.1 10*3/uL (ref 0.0–0.7)
HCT: 40.5 % (ref 39.0–52.0)
Hemoglobin: 14.1 g/dL (ref 13.0–17.0)
MCHC: 35 g/dL (ref 30.0–36.0)
Monocytes Absolute: 0.4 10*3/uL (ref 0.1–1.0)
Neutrophils Relative %: 47.6 % (ref 43.0–77.0)
Platelets: 97 10*3/uL — ABNORMAL LOW (ref 150.0–400.0)
RBC: 4.11 Mil/uL — ABNORMAL LOW (ref 4.22–5.81)

## 2011-01-22 LAB — HEPATIC FUNCTION PANEL
Albumin: 4.1 g/dL (ref 3.5–5.2)
Alkaline Phosphatase: 46 U/L (ref 39–117)
Total Protein: 7.7 g/dL (ref 6.0–8.3)

## 2011-01-22 LAB — PSA: PSA: 1.45 ng/mL (ref 0.10–4.00)

## 2011-01-22 LAB — TSH: TSH: 1.83 u[IU]/mL (ref 0.35–5.50)

## 2011-01-22 MED ORDER — TRAMADOL HCL 50 MG PO TABS: 50.0000 mg | ORAL_TABLET | Freq: Four times a day (QID) | ORAL | Status: DC | PRN

## 2011-01-22 NOTE — Assessment & Plan Note (Signed)
Plan: life long Coumadin

## 2011-01-22 NOTE — Assessment & Plan Note (Signed)
LDL goal = < 130.

## 2011-01-22 NOTE — Assessment & Plan Note (Signed)
Plan: no change

## 2011-01-22 NOTE — Progress Notes (Signed)
Subjective:    Patient ID: Patrick Hatfield, male    DOB: November 29, 1934, 76 y.o.   MRN: [id number]  HPI Medicare Wellness Visit:  The following psychosocial & medical history were reviewed as required by Medicare.   Social history: caffeine: minimal , alcohol:  3-4/ day ,  tobacco use : quit  1996  & exercise :  6 x/ week.   Home & personal  safety / fall risk: no issues, activities of daily living: no limitations , seatbelt use : yes , and smoke alarm employment : yes .  Power of Attorney/Living Will status : inplace  Vision ( as recorded per Nurse) & Hearing  evaluation :  Wall chart read @ 6 ft with lenses; decreased hearing to whisper @ 6 ft. He will pursue through [facility]. Orientation :oriented X3 , memory & recall :good , spelling or math testing: good,and mood & affect : normal . Depression / anxiety:  normal Travel history : 26 [place] , immunization status :up to date , transfusion history:  no, and preventive health surveillance ( colonoscopies, BMD , etc as per protocol/ [facility]): colonoscopy as per GI, Dental care:  Seen prn only . Chart reviewed &  Updated. Active issues reviewed & addressed.       Review of Systems  #1 CHRONIC HYPERTENSION Disease Monitoring  Blood pressure range: not taken  Chest pain: no   Dyspnea: no   Claudication: no  Medication compliance: yes  Medication Side Effects  Lightheadedness: no   Urinary frequency: yes, Nocturia 2x also   Edema: yes, minor  Preventitive Healthcare:  Exercise: yes, walking, swim , golf   Diet Pattern: low fat  Salt Restriction: no  #2ABDOMINAL PAIN Location: RUQ  Onset: 1 am   Radiation: across upper abd from epigastrium to RUQ  Severity: up to 8 Quality: sharp  Duration: until 4 am  Better with: Gasex & Tylenol of no benefit  Worse with: no exacerbating factors PMH of ERD/HH Symptoms Nausea/Vomiting: no  Diarrhea: no  Constipation: no  Melena/BRBPR: no  Hematemesis: no  Anorexia: no  Fever/Chills: no  Dysuria: no   NSAIDs/ASA: yes, Meloxicam   #3 Hematospermia with nocturnal ejaculation 3-4 weeks ago X1       Objective:   Physical Exam General appearance is of good health and nourishment; no acute distress or increased work of breathing is present.  No  lymphadenopathy about the head, neck, or axilla noted.   Eyes: No conjunctival inflammation or lid edema is present. There is no scleral icterus.  Oral exam: Dental hygiene is good; lips and gums are healthy appearing.There is minimal uvular  erythema w/o  exudate noted.   Neck:  No deformities, thyromegaly, masses, or tenderness noted.     Heart:  Slow rate and regular rhythm. S1 and S2 normal without gallop, murmur, click, rub or other extra sounds.   Lungs:Chest clear to auscultation; no wheezes, rhonchi,rales ,or rubs present.No increased work of breathing.    Abdomen: bowel sounds normal, soft and non-tender without masses, or  Organomegaly. Umbilical  Hernia  noted.  No guarding or rebound  Genitourinary: Testicles are normal with  Epididymal granuloma. The penis & urethra are normal in  appearance without discharge or erythema. Rectal tone is normal. The prostate is slightly asymmetric( R >L); there is no induration or nodule present.  Extremities:  No cyanosis, edema, or clubbing  noted  Neurologic exam :DTrs WNL; slight head tremor.   Skin: Warm & dry w/o jaundice or  tenting. Stasis dermatitis hyperpigmentation  of ankles.  Psych:  Cognition and judgment appear intact. Alert, communicative  and cooperative with normal attention span and concentration. No apparent delusions, illusions, hallucinations         Assessment & Plan:  #1 Medicare Wellness Exam; see data #2 Abd pain , acute #3hematospermia #4 HTN , controlled Plan: see Orders & recommendations

## 2011-01-22 NOTE — Assessment & Plan Note (Signed)
Plan: lifelong anticoagulation

## 2011-01-22 NOTE — Patient Instructions (Addendum)
Complete stool cards.The triggers for dyspepsia or "heart burn"  include stress; the "aspirin family" ; alcohol; peppermint; and caffeine (coffee, tea, cola, and chocolate). The aspirin family would include aspirin and the nonsteroidal agents such as ibuprofen &  Naproxen. Tylenol would not cause reflux. If having dyspepsia ; food & dink should be avoided for @ least 2 hors before going to bed.  Hold Meloxicam; take Tramadol as needed Preventive Health Care: Exercise at least 30-45 minutes a day,  3-4 days a week.  Eat a low-fat diet with lots of fruits and vegetables, up to 7-9 servings per day. Avoid obesity; your goal is waist measurement < 40 inches.Consume less than 40 grams of sugar per day from foods & drinks with High Fructose Corn Sugar as #2,3 or # 4 on label. Eye Doctor - have an eye exam @ least annually.                                                         Alcohol If you drink, do it moderately,less than 9 drinks per week, preferably less than 6 @ most. Health Care Power of Attorney & Living Will. Complete if not in place ; these place you in charge of your health care decisions. Depression is common in our stressful world.If you're feeling down or losing interest in things you normally enjoy, please call .   Called patient at 5:02 pm to inform patient of PT/INR results, per Dr.Hopper change to 2.5mg  M/W/F, 5mg  all other days and recheck in 3 weeks

## 2011-01-23 ENCOUNTER — Encounter: Payer: Self-pay | Admitting: Internal Medicine

## 2011-01-31 ENCOUNTER — Other Ambulatory Visit (INDEPENDENT_AMBULATORY_CARE_PROVIDER_SITE_OTHER): Payer: Medicare Other

## 2011-01-31 ENCOUNTER — Other Ambulatory Visit: Payer: Self-pay | Admitting: Internal Medicine

## 2011-01-31 DIAGNOSIS — Z1211 Encounter for screening for malignant neoplasm of colon: Secondary | ICD-10-CM

## 2011-01-31 LAB — HEMOCCULT GUIAC POC 1CARD (OFFICE)
Card #3 Fecal Occult Blood, POC: NEGATIVE
Fecal Occult Blood, POC: POSITIVE

## 2011-01-31 NOTE — Progress Notes (Signed)
Labs only

## 2011-02-10 ENCOUNTER — Telehealth: Payer: Self-pay | Admitting: *Deleted

## 2011-02-10 DIAGNOSIS — K921 Melena: Secondary | ICD-10-CM

## 2011-02-10 NOTE — Telephone Encounter (Signed)
Pt called and states that he would like to see GI in regards to his positive hemoccult card.

## 2011-02-12 ENCOUNTER — Ambulatory Visit (INDEPENDENT_AMBULATORY_CARE_PROVIDER_SITE_OTHER): Payer: Medicare Other | Admitting: *Deleted

## 2011-02-12 ENCOUNTER — Ambulatory Visit (INDEPENDENT_AMBULATORY_CARE_PROVIDER_SITE_OTHER): Payer: Medicare Other

## 2011-02-12 DIAGNOSIS — D689 Coagulation defect, unspecified: Secondary | ICD-10-CM

## 2011-02-12 DIAGNOSIS — Z7901 Long term (current) use of anticoagulants: Secondary | ICD-10-CM

## 2011-02-12 NOTE — Patient Instructions (Signed)
Return to office in 4 weeks for PT/INR  Continue current dose: 1 tab (5mg ) daily except 1/2 tab (2.5mg ) MWF

## 2011-03-10 ENCOUNTER — Other Ambulatory Visit: Payer: Self-pay | Admitting: Internal Medicine

## 2011-03-11 MED ORDER — WARFARIN SODIUM 5 MG PO TABS: 5.0000 mg | ORAL_TABLET | Freq: Every day | ORAL | Status: DC

## 2011-03-11 NOTE — Telephone Encounter (Signed)
RX sent to pharmacy  

## 2011-03-14 ENCOUNTER — Ambulatory Visit (INDEPENDENT_AMBULATORY_CARE_PROVIDER_SITE_OTHER): Payer: Medicare Other | Admitting: *Deleted

## 2011-03-14 DIAGNOSIS — Z7901 Long term (current) use of anticoagulants: Secondary | ICD-10-CM

## 2011-03-14 DIAGNOSIS — I82409 Acute embolism and thrombosis of unspecified deep veins of unspecified lower extremity: Secondary | ICD-10-CM

## 2011-03-14 NOTE — Patient Instructions (Signed)
Pt advised no change recheck 4 weeks  

## 2011-03-17 ENCOUNTER — Encounter: Payer: Self-pay | Admitting: *Deleted

## 2011-03-21 ENCOUNTER — Encounter: Payer: Self-pay | Admitting: Gastroenterology

## 2011-03-21 ENCOUNTER — Ambulatory Visit (INDEPENDENT_AMBULATORY_CARE_PROVIDER_SITE_OTHER): Payer: Medicare Other | Admitting: Gastroenterology

## 2011-03-21 DIAGNOSIS — Z86010 Personal history of colonic polyps: Secondary | ICD-10-CM

## 2011-03-21 DIAGNOSIS — D6851 Activated protein C resistance: Secondary | ICD-10-CM | POA: Insufficient documentation

## 2011-03-21 DIAGNOSIS — D6859 Other primary thrombophilia: Secondary | ICD-10-CM

## 2011-03-21 DIAGNOSIS — R079 Chest pain, unspecified: Secondary | ICD-10-CM

## 2011-03-21 DIAGNOSIS — R195 Other fecal abnormalities: Secondary | ICD-10-CM

## 2011-03-21 DIAGNOSIS — R109 Unspecified abdominal pain: Secondary | ICD-10-CM | POA: Insufficient documentation

## 2011-03-21 MED ORDER — AMBULATORY NON FORMULARY MEDICATION: Status: DC

## 2011-03-21 MED ORDER — DEXLANSOPRAZOLE 60 MG PO CPDR: 60.0000 mg | DELAYED_RELEASE_CAPSULE | Freq: Every day | ORAL | Status: DC

## 2011-03-21 NOTE — Patient Instructions (Addendum)
Your procedure has been scheduled for 03/24/2011, please follow the seperate instructions.  Your prescription(s) have been sent to you pharmacy.  Your abdominal ultrasound is scheduled for 03/24/2011 at [facility] arrive at 10:45am for a 11am scan, please have nothing to eat or drink after midnight.  Discontinue your Prilosec and start Dexilant once a day 30 min before breakfast samples given and an rx sent to your pharmacy.  Use Gi Cocktail as needed, rx sent to your pharmacy.

## 2011-03-21 NOTE — Progress Notes (Signed)
History of Present Illness:  This is a very pleasant 76 year old Caucasian male with factor V Leiden recently followed by Dr. Cyndie Chime in hematology and Dr. Marga Melnick in internal medicine. I previously saw this patient for screening colonoscopy in October 2008. At that time he was noted and severe diverticulosis,  inflammatory and adenomatous polyps. He recently had a guaiac positive stool on Hemoccult testing, but has had no anemia or lower gastrointestinal symptoms. His main complaint today is several episodes of rather severe substernal pain radiating into his right chest doubt cardiovascular or pulmonary complaints. These episodes occur spontaneously and last 7-8 hours in duration. He has chronic acid reflux and is on omeprazole 20 mg a day. He has not had previous endoscopy or upper GI series or ultrasound exam. He denies specific hepatobiliary complaints or systemic complaints, history of alcohol or cigarette or NSAID abuse. He does take meloxicam 7.5 mg a day and Coumadin 5 mg a day, and apparently was told that he cannot stop Coumadin without Lovenox coverage. He denies any specific food intolerances, anorexia, weight loss, or history of hepatitis or pancreatitis.  I have reviewed this patient's present history, medical and surgical past history, allergies and medications.     ROS: The remainder of the 10 point ROS is negative.. history of chronic low back pain  Past Medical History  Diagnosis Date  . Proteinuria age 70  . Gilbert's syndrome   . Benign essential tremor   . Colitis in 20's  . Depression 2006    drug overdose  . Coagulopathy     Factor V Deficiency  . Personal history of colonic polyps 05/28/2007    adenomatous  . Insomnia   . 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 Esophageal reflux   . DVT (deep venous thrombosis)   . History of shingles   . Hiatal hernia   . Diverticulosis    Past Surgical History  Procedure Date  . Knee  arthroscopy 2009    Dr.Aplington bilateral  . Tonsillectomy   . Transurethral resection of prostate   . Cystoscopy     Neg    reports that he quit smoking about 16 years ago. His smoking use included Cigarettes. He has never used smokeless tobacco. He reports that he drinks alcohol. He reports that he does not use illicit drugs. family history includes Aortic aneurysm in his brother; Coronary artery disease in his mother; Diabetes in his father; HIV in his brother; Heart attack in his paternal grandfather and paternal uncle; Multiple sclerosis in his sister; Osteoporosis in his mother; Pancreatic cancer in his father; Stroke in his father; and Ulcers in his father. Allergies  Allergen Reactions  . Amlodipine Besy-Benazepril Hcl     REACTION: swelling of feet.  Probably  from Amlodipine component)  . Lipitor (Atorvastatin Calcium)         Physical Exam: General well developed well nourished patient in no acute distress, appearing his stated age Eyes PERRLA, no icterus, fundoscopic exam per opthamologist Skin no lesions noted Neck supple, no adenopathy, no thyroid enlargement, no tenderness Chest clear to percussion and auscultation Heart no significant murmurs, gallops or rubs noted Abdomen no hepatosplenomegaly masses or tenderness, BS normal.  Extremities no acute joint lesions, edema, phlebitis or evidence of cellulitis. Neurologic patient oriented x 3, cranial nerves intact, no focal neurologic deficits noted. Psychological mental status normal and normal affect.  Assessment and plan: Rule out cholelithiasis and biliary colic. Also consider large  hiatal hernia with intermittent gastric torsion. I have scheduled him for ultrasonography and upper GI endoscopy. He is to continue his PPI therapy and we'll prescribe when necessary sublingual Levsin. Recent labs were reviewed and were unremarkable. Continue all his other medications as listed and reviewed his record per Dr. Alwyn Ren. We'll  send a copy of this note to Dr. Alwyn Ren and to hematology for advice concerning his colonoscopy and anticoagulation. He definitely will need to be off of Coumadin because of his history of recurrent polyps. I suspect we can hold his Coumadin for 5 days with Lovenox coverage and proceed in the future, but I have not schedule colonoscopy at this time but will address his upper GI problems. Does note that the patient is on daily NSAID therapy.  Encounter Diagnosis  Name Primary?  . Abdominal pain Yes

## 2011-03-24 ENCOUNTER — Ambulatory Visit (AMBULATORY_SURGERY_CENTER): Payer: Medicare Other | Admitting: Gastroenterology

## 2011-03-24 ENCOUNTER — Encounter: Payer: Self-pay | Admitting: Gastroenterology

## 2011-03-24 ENCOUNTER — Ambulatory Visit (HOSPITAL_COMMUNITY)
Admission: RE | Admit: 2011-03-24 | Discharge: 2011-03-24 | Disposition: A | Discharge status: Home / Self Care | Payer: Medicare Other | Source: Ambulatory Visit | Attending: Gastroenterology | Admitting: Gastroenterology

## 2011-03-24 DIAGNOSIS — K8020 Calculus of gallbladder without cholecystitis without obstruction: Secondary | ICD-10-CM | POA: Insufficient documentation

## 2011-03-24 DIAGNOSIS — R109 Unspecified abdominal pain: Secondary | ICD-10-CM

## 2011-03-24 DIAGNOSIS — R072 Precordial pain: Secondary | ICD-10-CM

## 2011-03-24 DIAGNOSIS — D131 Benign neoplasm of stomach: Secondary | ICD-10-CM

## 2011-03-24 DIAGNOSIS — K219 Gastro-esophageal reflux disease without esophagitis: Secondary | ICD-10-CM

## 2011-03-24 DIAGNOSIS — K2900 Acute gastritis without bleeding: Secondary | ICD-10-CM | POA: Insufficient documentation

## 2011-03-24 DIAGNOSIS — K2270 Barrett's esophagus without dysplasia: Secondary | ICD-10-CM | POA: Insufficient documentation

## 2011-03-24 MED ORDER — SODIUM CHLORIDE 0.9 % IV SOLN: 500.0000 mL | INTRAVENOUS | Status: DC

## 2011-03-24 NOTE — Patient Instructions (Signed)
Resume all medications.Information given on gastritis.Referral for cholecystectomy.D/C completed.

## 2011-03-25 ENCOUNTER — Telehealth: Payer: Self-pay | Admitting: *Deleted

## 2011-03-25 NOTE — Telephone Encounter (Signed)
Follow up Call- Patient questions:  Do you have a fever, pain , or abdominal swelling? no Pain Score  0 *  Have you tolerated food without any problems? yes  Have you been able to return to your normal activities? yes  Do you have any questions about your discharge instructions: Diet   no Medications  no Follow up visit  no  Do you have questions or concerns about your Care? no  Actions: * If pain score is 4 or above: No action needed, pain <4.  Wife asking if he had a hiatal hernia, she states the doctor did not mention,patient stating Dr. Jarold Motto said the hernia was no problem. Referred to procedure report. Wife stating Dr. Jarold Motto had mentioned a colonoscopy after his cholecystectomy. Instructed wife to call Dr. Norval Gable office once cleared post op to schedule the Colonoscopy.

## 2011-03-26 ENCOUNTER — Telehealth: Payer: Self-pay | Admitting: *Deleted

## 2011-03-26 ENCOUNTER — Encounter: Payer: Self-pay | Admitting: Gastroenterology

## 2011-03-26 DIAGNOSIS — R1011 Right upper quadrant pain: Secondary | ICD-10-CM

## 2011-03-26 DIAGNOSIS — K219 Gastro-esophageal reflux disease without esophagitis: Secondary | ICD-10-CM

## 2011-03-26 NOTE — Telephone Encounter (Signed)
Pt reminded of his appt 03/31/11 at 4pm.

## 2011-03-31 ENCOUNTER — Encounter: Payer: Self-pay | Admitting: Gastroenterology

## 2011-03-31 ENCOUNTER — Encounter (INDEPENDENT_AMBULATORY_CARE_PROVIDER_SITE_OTHER): Payer: Self-pay | Admitting: General Surgery

## 2011-03-31 ENCOUNTER — Ambulatory Visit (INDEPENDENT_AMBULATORY_CARE_PROVIDER_SITE_OTHER): Payer: Medicare Other | Admitting: General Surgery

## 2011-03-31 DIAGNOSIS — K8020 Calculus of gallbladder without cholecystitis without obstruction: Secondary | ICD-10-CM

## 2011-03-31 DIAGNOSIS — K819 Cholecystitis, unspecified: Secondary | ICD-10-CM | POA: Insufficient documentation

## 2011-03-31 NOTE — Progress Notes (Signed)
Subjective:     Patient ID: Patrick Hatfield., male   DOB: 04-Nov-1934, 76 y.o.   MRN: [id number]  HPI We're asked to see the patient in consultation by Dr. Sheryn Bison to evaluate him for gallstones. The patient is a 76 year old white male who has been experiencing some gaseous discomfort for the last month to month and a half. He denies any nausea or vomiting during these episodes. He describes the pain as a pressure type pain that occurs mostly at night. The pain would come and go and at times would last as much of 6 hours. The pain was located under his right ribs and did not radiate anywhere. He denies any fevers or chills. He denies any chest pain or shortness of breath.  Review of Systems  Constitutional: Negative.   HENT: Negative.   Eyes: Negative.   Respiratory: Negative.   Cardiovascular: Negative.   Gastrointestinal: Negative.   Genitourinary: Negative.   Musculoskeletal: Negative.   Skin: Negative.   Neurological: Negative.   Hematological: Bruises/bleeds easily.  Psychiatric/Behavioral: Negative.    Past Medical History  Diagnosis Date  . Proteinuria age 36  . Gilbert's syndrome   . Benign essential tremor   . Colitis in 20's  . Depression 2006    drug overdose  . Coagulopathy     Factor V Deficiency  . Personal history of colonic polyps 05/28/2007    adenomatous  . Insomnia   . 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 Esophageal reflux   . DVT (deep venous thrombosis)   . History of shingles   . Diverticulosis   . Hiatal hernia   . Pain     right side under rib cage  . Umbilical hernia    Past Surgical History  Procedure Date  . Knee arthroscopy 2009    Dr.Aplington bilateral  . Tonsillectomy   . Transurethral resection of prostate   . Cystoscopy     Neg   Current outpatient prescriptions:acetaminophen (TYLENOL) 650 MG CR tablet, Take 650 mg by mouth every 8 (eight) hours as needed.  , Disp: , Rfl: ;  cholecalciferol  (VITAMIN D) 1000 UNITS tablet, Take 1,000 Units by mouth daily.  , Disp: , Rfl: ;  dexlansoprazole (DEXILANT) 60 MG capsule, Take 1 capsule (60 mg total) by mouth daily., Disp: 30 capsule, Rfl: 6;  folic acid (FOLVITE) 400 MCG tablet, Take 400 mcg by mouth daily.  , Disp: , Rfl:  gabapentin (NEURONTIN) 300 MG capsule, Take 300 mg by mouth 3 (three) times daily.  , Disp: , Rfl: ;  losartan-hydrochlorothiazide (HYZAAR) 100-12.5 MG per tablet, TAKE 1 TABLET BY MOUTH EVERY DAY, Disp: 30 tablet, Rfl: 5;  Omega-3 Fatty Acids (FISH OIL) 1200 MG CAPS, Take 1,200 mg by mouth 2 (two) times daily.  , Disp: , Rfl: ;  propranolol (INDERAL) 40 MG tablet, TAKE 1 TABLET TWICE A DAY, Disp: 180 tablet, Rfl: 1 warfarin (COUMADIN) 5 MG tablet, Take 5 mg by mouth as directed. As directed based on PT/INR reading , Disp: , Rfl: ;  AMBULATORY NON FORMULARY MEDICATION, GI Cocktail 20cc maalox, 10cc donatal, 20cc xylocaine Swish and swallow as needed, Disp: 600 mL, Rfl: 0 Current facility-administered medications:0.9 %  sodium chloride infusion, 500 mL, Intravenous, Continuous, Sheryn Bison, MD    Allergies  Allergen Reactions  . Amlodipine Besy-Benazepril Hcl     REACTION: swelling of feet.  Probably  from Amlodipine component)  . Lipitor (Atorvastatin Calcium)  Objective:   Physical Exam  Constitutional: He is oriented to person, place, and time. He appears well-developed and well-nourished.  HENT:  Head: Normocephalic and atraumatic.  Eyes: Conjunctivae and EOM are normal. Pupils are equal, round, and reactive to light.  Neck: Normal range of motion. Neck supple.  Cardiovascular: Normal rate, regular rhythm and normal heart sounds.   Pulmonary/Chest: Effort normal and breath sounds normal.  Abdominal: Soft. Bowel sounds are normal.  Musculoskeletal: Normal range of motion.  Neurological: He is alert and oriented to person, place, and time.  Skin: Skin is warm and dry.  Psychiatric: He has a normal mood and  affect. His behavior is normal.       Assessment:     The patient has what sounds like symptomatic gallstones. Because of the risk of further painful episodes and possible pancreatitis I think he would benefit from having his gallbladder removed. He would also like to have this done. His surgery will mostly be complicated by his factor V Leiden deficiency. I have contacted Dr. Cyndie Chime who instructed [place] to stop his Coumadin 3 days prior to surgery. He will not need any Lovenox bridging. He will send me the dose of Lovenox for the following day after surgery. He recommended restarting the Coumadin the night of surgery.    Plan:     Plan for laparoscopic cholecystectomy with intraoperative cholangiogram with perioperative management of his blood thinners.

## 2011-03-31 NOTE — Patient Instructions (Addendum)
Dr. Cyndie Chime to manage coumadin prior to surgery. Stop coumadin 3 days prior to surgery.

## 2011-04-16 IMAGING — CT CT PELVIS W/ CM
1 of 3 series · 13 of 32 positions shown, 18 images · IV contrast (agent unspecified)
Comparison: None.

CT ABDOMEN

CLINICAL DATA: Diffuse abdominal pain.  Bloating.  Diarrhea.

CT ABDOMEN AND PELVIS WITH CONTRAST
TECHNIQUE: Multidetector CT imaging of the abdomen and pelvis was
performed using the standard protocol following bolus
administration of intravenous contrast.
Contrast: 100 ml [id number]-[id number] next field none.

[Series 2: rtn ap with st · axial · 0.83mm/px · z∈[-130,+300]mm · 13 of 100 slices shown, 18 images]
[im 7/100  soft-tissue]
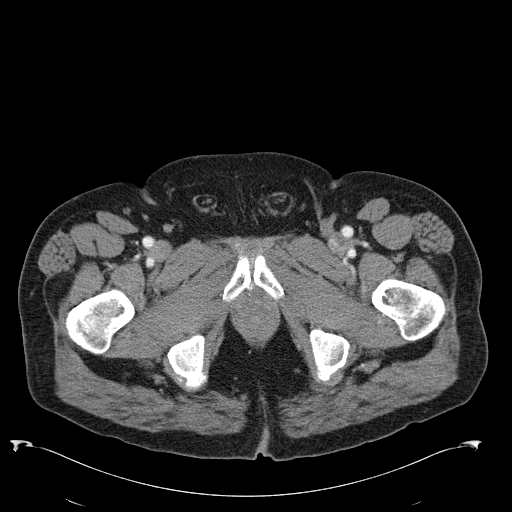
[im 7/100  bone]
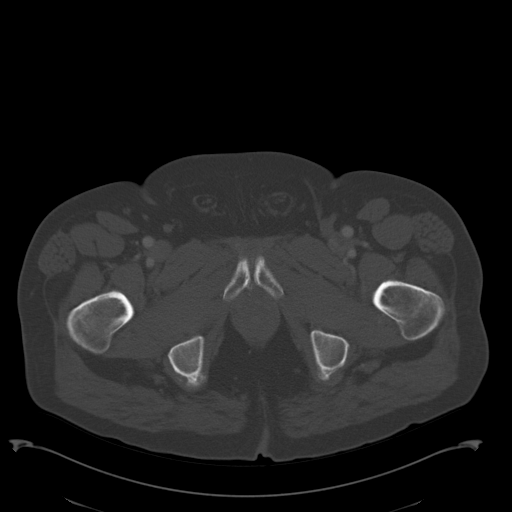
[im 13/100  soft-tissue]
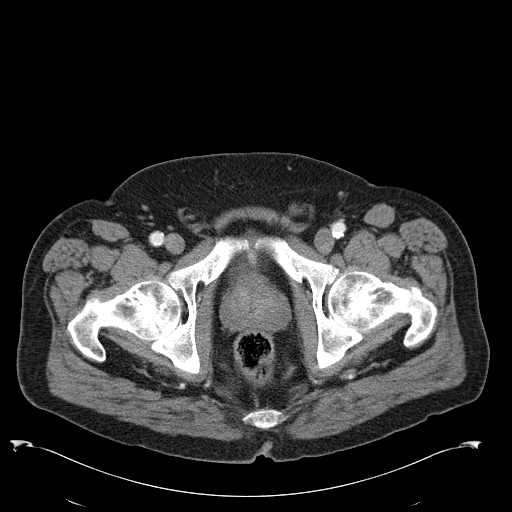
[im 25/100  soft-tissue]
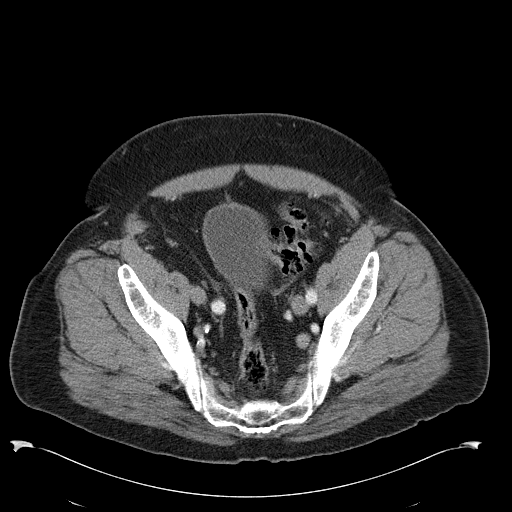
[im 31/100  soft-tissue]
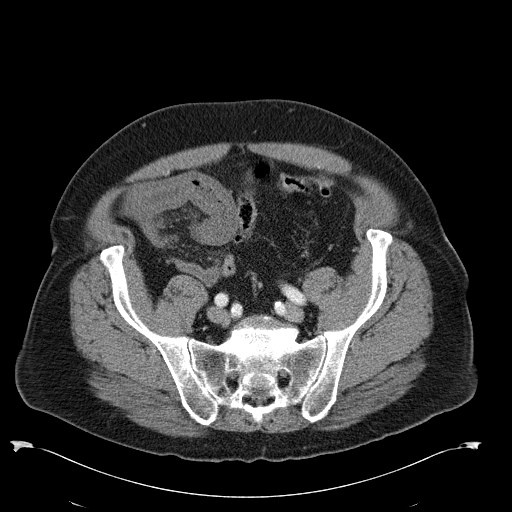
[im 38/100  soft-tissue]
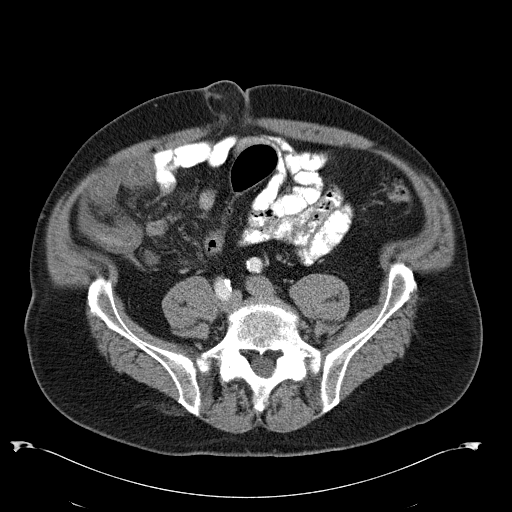
[im 44/100  soft-tissue]
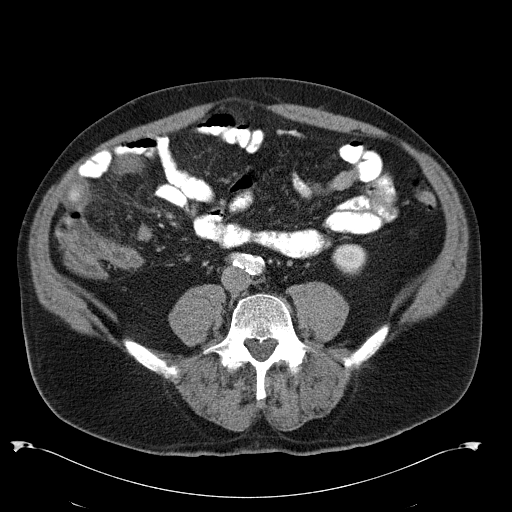
[im 56/100  soft-tissue]
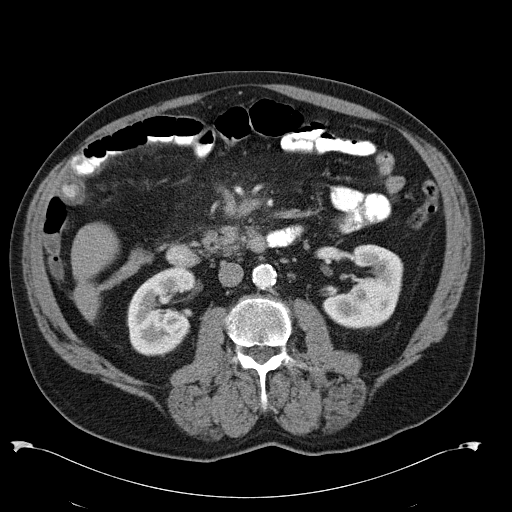
[im 62/100  soft-tissue]
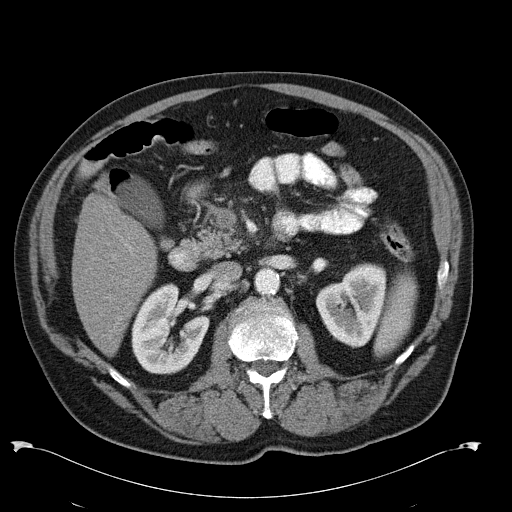
[im 69/100  soft-tissue]
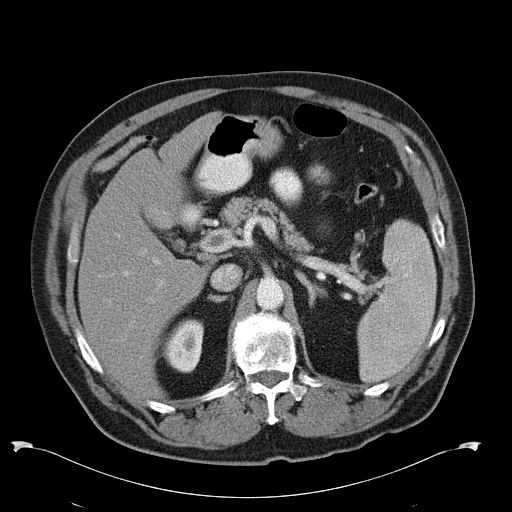
[im 69/100  bone]
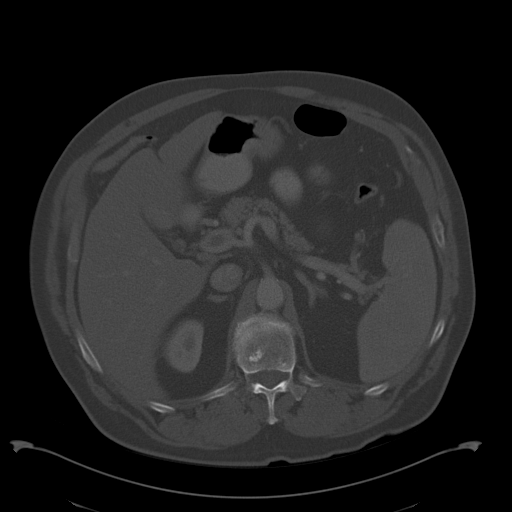
[im 75/100  soft-tissue]
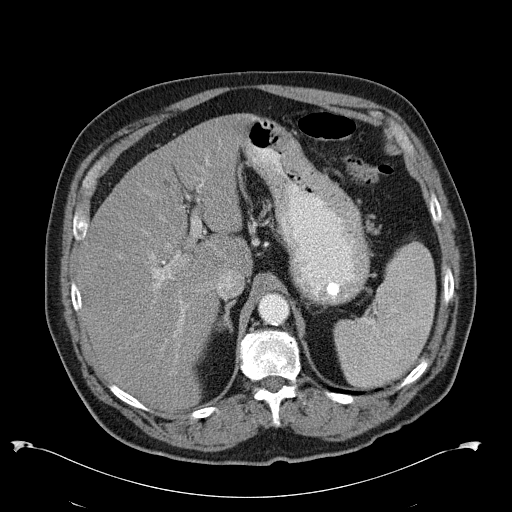
[im 75/100  lung]
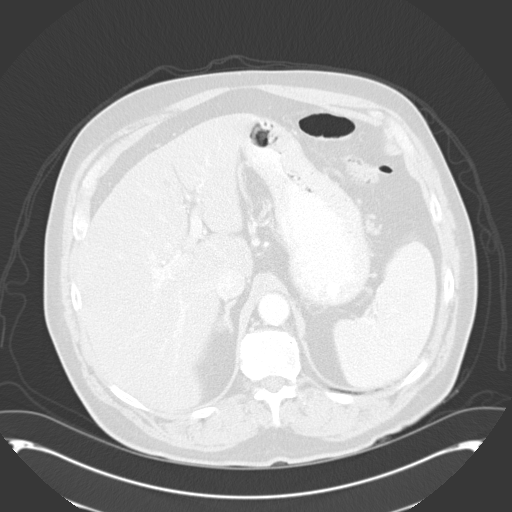
[im 81/100  lung]
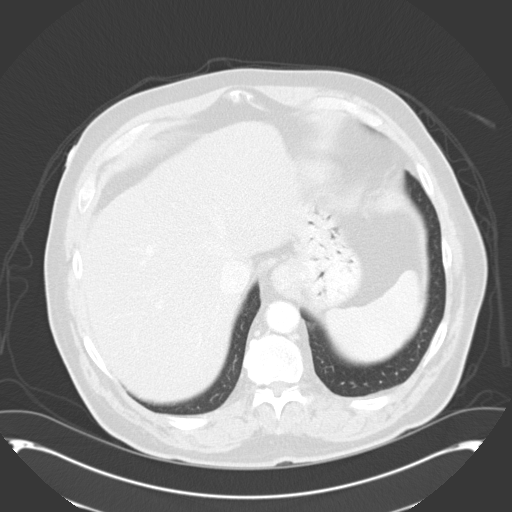
[im 87/100  soft-tissue]
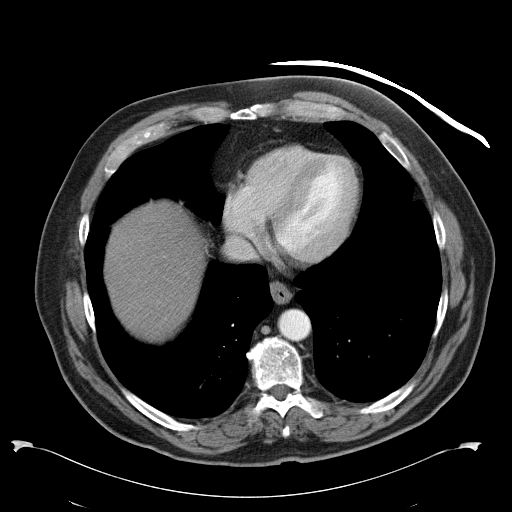
[im 87/100  lung]
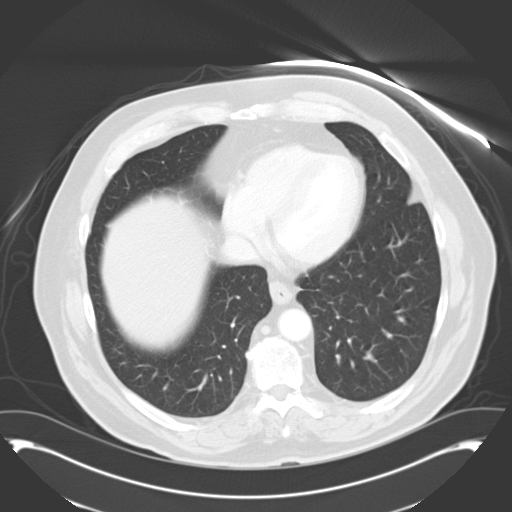
[im 93/100  soft-tissue]
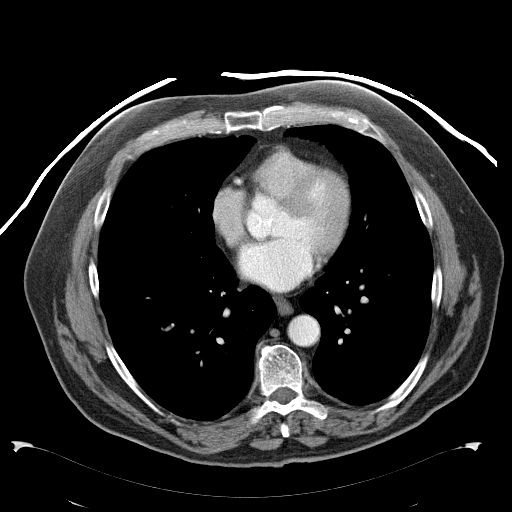
[im 93/100  lung]
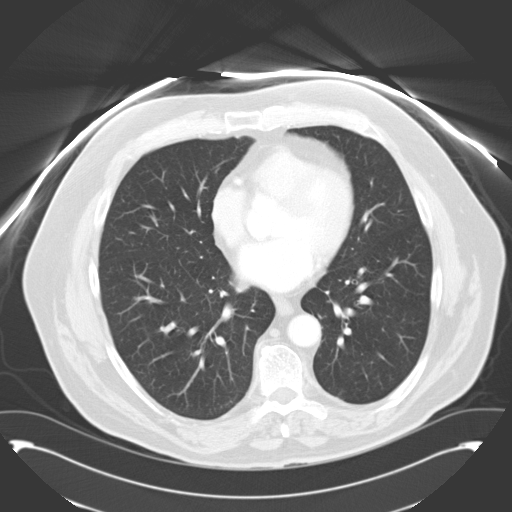

[13 of 32 positions shown; findings below may reference images not displayed]

FINDINGS: Tiny hypodensity in the medial segment of the left liver
is much too small to characterize but it is probably a tiny hepatic
cyst.  Spleen is unremarkable.  The stomach, duodenum, pancreas,
gallbladder, adrenal glands, and kidneys have normal imaging
features.

There is edema around the superior mesenteric vein which is
enlarged.  A filling defect in the superior mesenteric vein extends
centrally into the main portal vein.  There is no evidence for
extension of thrombus into either the left or right portal vein.

The patient has a 20-30 cm segment of small bowel in the right
abdomen and upper pelvis which shows circumferential edematous wall
thickening.  There is perienteric edema around this abnormal bowel
and there is associated mesenteric congestion.

No abdominal aortic aneurysm.  The celiac axis and the superior
mesenteric artery are widely patent.  There is no intraperitoneal
free fluid.  Small lymph nodes are seen in the retroperitoneum but
no nodes meets CT criteria for pathologic enlargement.

A right paraumbilical ventral hernia measures 3.4 x 3.2 x 3.6 cm
and contains only omental fat.  No evidence for fat incarceration
by CT imaging.
IMPRESSION: Acute thrombus within the superior mesenteric vein extends into the
main portal vein but does not reach the portal vein bifurcation.

Abnormal segment of small bowel in the right abdomen and upper
pelvis (probably ileum) shows circumferential edematous wall
thickening with mesenteric vascular congestion and perienteric
edema.  This probably reflects ischemia secondary to the mesenteric
venous thrombosis.

Small right paraumbilical hernia contains only omental fat.

CT PELVIS
FINDINGS: Small amount of intraperitoneal free fluid is
visualized.  Prostate gland is enlarged.  The bladder is
unremarkable.  There is no pelvic sidewall lymphadenopathy.
Advanced diverticulosis is seen in the sigmoid colon without
diverticulitis.  The right colon is decompressed but shows no
evidence for wall thickening or pericolonic edema.  The terminal
ileum is normal.  The appendix is normal.

Bone windows reveal no worrisome lytic or sclerotic osseous
lesions.
IMPRESSION: Small amount of intraperitoneal free fluid is evident.

I discussed these findings by telephone with Dr. Tosiac at
approximately 7310 hours on 04/09/2009.

## 2011-04-17 ENCOUNTER — Ambulatory Visit (INDEPENDENT_AMBULATORY_CARE_PROVIDER_SITE_OTHER): Payer: Medicare Other | Admitting: *Deleted

## 2011-04-17 ENCOUNTER — Ambulatory Visit: Payer: Medicare Other

## 2011-04-17 DIAGNOSIS — Z7901 Long term (current) use of anticoagulants: Secondary | ICD-10-CM

## 2011-04-17 DIAGNOSIS — I82409 Acute embolism and thrombosis of unspecified deep veins of unspecified lower extremity: Secondary | ICD-10-CM

## 2011-04-17 NOTE — Patient Instructions (Signed)
Pt aware no change recheck in 4 weeks will have next check at Dr. Patsy Lager office and will have results forwarded.

## 2011-05-06 ENCOUNTER — Encounter (HOSPITAL_COMMUNITY)
Admission: RE | Admit: 2011-05-06 | Discharge: 2011-05-06 | Disposition: A | Discharge status: Home / Self Care | Payer: Medicare Other | Source: Ambulatory Visit | Attending: General Surgery | Admitting: General Surgery

## 2011-05-06 ENCOUNTER — Other Ambulatory Visit (INDEPENDENT_AMBULATORY_CARE_PROVIDER_SITE_OTHER): Payer: Self-pay | Admitting: General Surgery

## 2011-05-06 DIAGNOSIS — K8020 Calculus of gallbladder without cholecystitis without obstruction: Secondary | ICD-10-CM

## 2011-05-06 LAB — CBC
Hemoglobin: 15.2 g/dL (ref 13.0–17.0)
RBC: 4.64 MIL/uL (ref 4.22–5.81)
WBC: 5.7 10*3/uL (ref 4.0–10.5)

## 2011-05-06 LAB — DIFFERENTIAL
Basophils Absolute: 0 10*3/uL (ref 0.0–0.1)
Basophils Relative: 1 % (ref 0–1)
Lymphocytes Relative: 47 % — ABNORMAL HIGH (ref 12–46)
Neutro Abs: 2.4 10*3/uL (ref 1.7–7.7)
Neutrophils Relative %: 42 % — ABNORMAL LOW (ref 43–77)

## 2011-05-06 LAB — COMPREHENSIVE METABOLIC PANEL
ALT: 44 U/L (ref 0–53)
AST: 35 U/L (ref 0–37)
Albumin: 4.1 g/dL (ref 3.5–5.2)
Calcium: 9.7 mg/dL (ref 8.4–10.5)
Sodium: 138 mEq/L (ref 135–145)
Total Protein: 7.7 g/dL (ref 6.0–8.3)

## 2011-05-06 LAB — SURGICAL PCR SCREEN: Staphylococcus aureus: NEGATIVE

## 2011-05-08 ENCOUNTER — Telehealth (INDEPENDENT_AMBULATORY_CARE_PROVIDER_SITE_OTHER): Payer: Self-pay | Admitting: General Surgery

## 2011-05-08 NOTE — Telephone Encounter (Signed)
ABNORMAL LABS RECEIVED FROM [facility] SHORT STAY. NO ACTION PER T.O. DR. P. TOTH. SHORT STAY NOTIFIED/ [id number].

## 2011-05-15 ENCOUNTER — Other Ambulatory Visit: Payer: Self-pay | Admitting: Oncology

## 2011-05-15 ENCOUNTER — Encounter (HOSPITAL_BASED_OUTPATIENT_CLINIC_OR_DEPARTMENT_OTHER): Payer: Medicare Other | Admitting: Oncology

## 2011-05-15 DIAGNOSIS — Z86718 Personal history of other venous thrombosis and embolism: Secondary | ICD-10-CM

## 2011-05-15 DIAGNOSIS — Z7901 Long term (current) use of anticoagulants: Secondary | ICD-10-CM

## 2011-05-15 DIAGNOSIS — D6859 Other primary thrombophilia: Secondary | ICD-10-CM

## 2011-05-15 LAB — PROTIME-INR: INR: 1.9 — ABNORMAL LOW (ref 2.00–3.50)

## 2011-05-15 LAB — CBC WITH DIFFERENTIAL/PLATELET
Eosinophils Absolute: 0.1 10*3/uL (ref 0.0–0.5)
HCT: 42.7 % (ref 38.4–49.9)
LYMPH%: 44.9 % (ref 14.0–49.0)
MONO#: 0.4 10*3/uL (ref 0.1–0.9)
NEUT#: 2.2 10*3/uL (ref 1.5–6.5)
NEUT%: 44.3 % (ref 39.0–75.0)
Platelets: 114 10*3/uL — ABNORMAL LOW (ref 140–400)
WBC: 5 10*3/uL (ref 4.0–10.3)
lymph#: 2.3 10*3/uL (ref 0.9–3.3)
nRBC: 0 % (ref 0–0)

## 2011-05-16 ENCOUNTER — Other Ambulatory Visit (INDEPENDENT_AMBULATORY_CARE_PROVIDER_SITE_OTHER): Payer: Self-pay | Admitting: General Surgery

## 2011-05-16 ENCOUNTER — Ambulatory Visit (HOSPITAL_COMMUNITY): Payer: Medicare Other

## 2011-05-16 ENCOUNTER — Ambulatory Visit (HOSPITAL_COMMUNITY)
Admission: RE | Admit: 2011-05-16 | Discharge: 2011-05-17 | Disposition: A | Discharge status: Home / Self Care | Payer: Medicare Other | Source: Ambulatory Visit | Attending: General Surgery | Admitting: General Surgery

## 2011-05-16 DIAGNOSIS — I10 Essential (primary) hypertension: Secondary | ICD-10-CM | POA: Insufficient documentation

## 2011-05-16 DIAGNOSIS — K811 Chronic cholecystitis: Secondary | ICD-10-CM

## 2011-05-16 DIAGNOSIS — K8020 Calculus of gallbladder without cholecystitis without obstruction: Secondary | ICD-10-CM | POA: Insufficient documentation

## 2011-05-16 DIAGNOSIS — Z86718 Personal history of other venous thrombosis and embolism: Secondary | ICD-10-CM | POA: Insufficient documentation

## 2011-05-16 DIAGNOSIS — D6859 Other primary thrombophilia: Secondary | ICD-10-CM | POA: Insufficient documentation

## 2011-05-16 DIAGNOSIS — Z23 Encounter for immunization: Secondary | ICD-10-CM | POA: Insufficient documentation

## 2011-05-16 DIAGNOSIS — Z86711 Personal history of pulmonary embolism: Secondary | ICD-10-CM | POA: Insufficient documentation

## 2011-05-16 HISTORY — PX: CHOLECYSTECTOMY: SHX55

## 2011-05-16 LAB — PROTIME-INR: Prothrombin Time: 18.4 seconds — ABNORMAL HIGH (ref 11.6–15.2)

## 2011-05-16 LAB — APTT: aPTT: 30 seconds (ref 24–37)

## 2011-05-17 LAB — PROTIME-INR: Prothrombin Time: 18.1 seconds — ABNORMAL HIGH (ref 11.6–15.2)

## 2011-05-19 ENCOUNTER — Other Ambulatory Visit: Payer: Self-pay | Admitting: Oncology

## 2011-05-19 ENCOUNTER — Encounter (HOSPITAL_BASED_OUTPATIENT_CLINIC_OR_DEPARTMENT_OTHER): Payer: Medicare Other | Admitting: Oncology

## 2011-05-19 DIAGNOSIS — Z86718 Personal history of other venous thrombosis and embolism: Secondary | ICD-10-CM

## 2011-05-19 DIAGNOSIS — D6859 Other primary thrombophilia: Secondary | ICD-10-CM

## 2011-05-19 DIAGNOSIS — Z7901 Long term (current) use of anticoagulants: Secondary | ICD-10-CM

## 2011-05-19 LAB — CBC WITH DIFFERENTIAL/PLATELET
Basophils Absolute: 0 10*3/uL (ref 0.0–0.1)
EOS%: 3.6 % (ref 0.0–7.0)
Eosinophils Absolute: 0.2 10*3/uL (ref 0.0–0.5)
HGB: 15.6 g/dL (ref 13.0–17.1)
LYMPH%: 42.5 % (ref 14.0–49.0)
MCH: 34.2 pg — ABNORMAL HIGH (ref 27.2–33.4)
MCV: 97.3 fL (ref 79.3–98.0)
MONO%: 9.2 % (ref 0.0–14.0)
NEUT#: 2.5 10*3/uL (ref 1.5–6.5)
Platelets: 115 10*3/uL — ABNORMAL LOW (ref 140–400)
RDW: 13.5 % (ref 11.0–14.6)

## 2011-05-19 LAB — MORPHOLOGY

## 2011-05-19 LAB — COMPREHENSIVE METABOLIC PANEL
ALT: 51 U/L (ref 0–53)
AST: 40 U/L — ABNORMAL HIGH (ref 0–37)
Albumin: 4.5 g/dL (ref 3.5–5.2)
Alkaline Phosphatase: 55 U/L (ref 39–117)
Potassium: 4.2 mEq/L (ref 3.5–5.3)
Sodium: 139 mEq/L (ref 135–145)
Total Bilirubin: 1.8 mg/dL — ABNORMAL HIGH (ref 0.3–1.2)
Total Protein: 7.9 g/dL (ref 6.0–8.3)

## 2011-05-19 LAB — PROTIME-INR
INR: 1.4 — ABNORMAL LOW (ref 2.00–3.50)
Protime: 16.8 Seconds — ABNORMAL HIGH (ref 10.6–13.4)

## 2011-05-19 LAB — GAMMA GT: GGT: 69 U/L — ABNORMAL HIGH (ref 7–51)

## 2011-05-21 NOTE — Op Note (Signed)
NAMETODDRICK, SANNA NO.:  [id number]  MEDICAL RECORD NO.:  [id number]  LOCATION:  [facility]                         FACILITY:  [facility]  PHYSICIAN:  Ollen Gross. Vernell Morgans, M.D. DATE OF BIRTH:  [birth year]  DATE OF PROCEDURE:  05/16/2011 DATE OF DISCHARGE:                              OPERATIVE REPORT   PREOPERATIVE DIAGNOSIS:  Gallstones.  POSTOPERATIVE DIAGNOSIS:  Gallstones.  PROCEDURE:  Laparoscopic cholecystectomy with intraoperative cholangiogram.  SURGEON:  Ollen Gross. Vernell Morgans, MD  ANESTHESIA:  General endotracheal.  PROCEDURE:  After informed consent was obtained, the patient was brought to the operating room, placed in supine position on the operating room table.  After adequate induction of general anesthesia, the patient's abdomen was prepped with ChloraPrep, allowed to dry, and draped in usual sterile manner.  The umbilicus had a hernia.  We infiltrated the area around the umbilicus with 0.25% Marcaine.  We made a small incision through the umbilicus with a 15-blade knife.  This incision was carried down through the subcutaneous tissue bluntly with a hemostat and Army- Engineer, water.  The omental fat in the hernia was reduced.  We identified the fascial edges, placed a 0-Vicryl pursestring stitch in the fascia around the opening.  Hasson cannula was placed through the opening and anchored in placed with Vicryl pursestring stitch.  Abdomen was then insufflated with carbon dioxide without difficulty.  The patient was placed in reverse Trendelenburg position, rotated with the right side up.  Next, the epigastric region was infiltrated with 0.25% Marcaine.  A small incision was made with a 15-blade knife and a 10-mm port was placed bluntly through this incision into the abdominal cavity under direct vision.  Sites were then chosen laterally.  On the rectus side, after placement of 5-mm ports, each of these areas infiltrated with 0.25% Marcaine.  Small stab  incisions were made with a 15 blade knife and 5-mm ports were placed bluntly through these incisions into the abdominal cavity under direct vision.  A blunt grasper was placed through the lateral-most 5-mm port and used to grasp the dome of the gallbladder and elevated anteriorly and superiorly.  Another blunt grasper was placed through the other 5-mm port and used to retract on the body and neck of the gallbladder.  Dissector was placed through the epigastric port and using electrocautery.  The peritoneal reflection at the gallbladder neck was opened.  Blunt dissection was then carried out in this area until the gallbladder neck cystic duct junction was readily identified and a good window was created.  A single clip was placed on the gallbladder neck.  A small ductotomy was made just below the clip with a laparoscopic scissors.  A 14-gauge Angiocath was placed percutaneously through the anterior abdominal wall under direct vision. A Reddick cholangiogram catheter was placed through the Angiocath and flushed.  The Reddick catheter was then placed in the cystic duct and anchored in place with a clip.  The cholangiogram was obtained that showed no filling defects, good emptying in duodenum, and adequate length on the cystic duct.  The anchoring clip and catheters were then removed from the patient.  Three clips were placed proximally in the  cystic duct and the duct was divided between the 2 sets of clips. Posterior to this, the cystic artery was identified and again dissected bluntly in a circumferential manner until a good window was created. Two clips were placed proximally and one distally on the artery and the artery are divided between the two.  Next, a laparoscopic hook cautery device was used to separate the gallbladder from the liver bed prior to completely detaching the gallbladder from liver bed.  The liver bed was inspected and several small bleeding points were coagulated  with electrocautery until the area was completely hemostatic.  The gallbladder was then detached rest away from the liver bed without difficulty.  The laparoscopic bag was then inserted through the epigastric port.  The gallbladder was placed in the bag and the bag was sealed.  The abdomen was then irrigated with copious amounts of saline until the effluent was clear.  The laparoscope was then moved to the epigastric port.  A gallbladder grasper was placed through the Hasson cannula and used to grasp the opening of the bag.  The bag with the gallbladder was removed with the Hasson cannula through the umbilical port without difficulty.  The fascial defect was then closed with a previously placed Vicryl pursestring stitch as well as with another figure-of-eight 0-Vicryl stitch.  The rest of the ports were removed under direct vision and were found to be hemostatic.  The gas was allowed to escape.  The skin incisions were all closed with interrupted 4-0 Monocryl subcuticular stitches and Dermabond dressings were applied. The patient tolerated the procedure well.  At the end of the case, all needle, sponge, and instrument counts were correct.    Ollen Gross. Vernell Morgans, M.D.     PST/MEDQ  D:  05/16/2011  T:  05/16/2011  Job:  [id number]  Electronically Signed by Chevis Pretty III M.D. on 05/21/2011 12:58:49 PM

## 2011-05-24 ENCOUNTER — Other Ambulatory Visit: Payer: Self-pay | Admitting: Internal Medicine

## 2011-06-05 ENCOUNTER — Encounter (INDEPENDENT_AMBULATORY_CARE_PROVIDER_SITE_OTHER): Payer: Self-pay | Admitting: General Surgery

## 2011-06-05 ENCOUNTER — Ambulatory Visit (INDEPENDENT_AMBULATORY_CARE_PROVIDER_SITE_OTHER): Payer: Medicare Other | Admitting: General Surgery

## 2011-06-05 DIAGNOSIS — K8020 Calculus of gallbladder without cholecystitis without obstruction: Secondary | ICD-10-CM

## 2011-06-05 MED ORDER — CHOLESTYRAMINE LIGHT 4 G PO PACK: 4.0000 g | PACK | Freq: Two times a day (BID) | ORAL | Status: DC

## 2011-06-05 NOTE — Patient Instructions (Signed)
No heavy lifting for 2 more weeks Cholestyramine for diarrhea

## 2011-06-05 NOTE — Progress Notes (Signed)
Subjective:     Patient ID: Patrick Hatfield, male   DOB: 1935-04-10, 76 y.o.   MRN: [id number]  HPI The patient is a 76 year old white male was about 2-1/2 weeks out from a laparoscopic cholecystectomy for cholecystitis. He is feeling fairly well but his main complaint is of persistent diarrhea. He's going about 5 or 6 times a day. He denies any fevers or chills. his appetite is slowly improving.  Review of Systems     Objective:   Physical Exam On exam his abdomen is soft and nontender. His incisions are all he went up nicely. He has no right upper quadrant pain.    Assessment:     2 1/2 weeks out from a laparoscopic cholecystectomy with persistent diarrhea    Plan:     Overall he looks good. We will plan to start him on some cholestyramine for the diarrhea and see if this helps. I will see him back in about 3 weeks to check his progress.

## 2011-06-09 ENCOUNTER — Encounter (INDEPENDENT_AMBULATORY_CARE_PROVIDER_SITE_OTHER): Payer: Medicare Other | Admitting: General Surgery

## 2011-06-16 ENCOUNTER — Ambulatory Visit (INDEPENDENT_AMBULATORY_CARE_PROVIDER_SITE_OTHER): Payer: Medicare Other | Admitting: *Deleted

## 2011-06-16 DIAGNOSIS — Z7901 Long term (current) use of anticoagulants: Secondary | ICD-10-CM

## 2011-06-16 DIAGNOSIS — I82409 Acute embolism and thrombosis of unspecified deep veins of unspecified lower extremity: Secondary | ICD-10-CM

## 2011-06-16 NOTE — Patient Instructions (Signed)
Return to office in 4 weeks for PT/INR  Continue current dose: 1 tab (5mg ) daily except 1/2 tab (2.5mg ) MWF

## 2011-06-21 ENCOUNTER — Other Ambulatory Visit: Payer: Self-pay | Admitting: Internal Medicine

## 2011-06-27 ENCOUNTER — Encounter (INDEPENDENT_AMBULATORY_CARE_PROVIDER_SITE_OTHER): Payer: Self-pay | Admitting: General Surgery

## 2011-06-27 ENCOUNTER — Ambulatory Visit (INDEPENDENT_AMBULATORY_CARE_PROVIDER_SITE_OTHER): Payer: Medicare Other | Admitting: General Surgery

## 2011-06-27 DIAGNOSIS — K819 Cholecystitis, unspecified: Secondary | ICD-10-CM

## 2011-06-27 NOTE — Patient Instructions (Signed)
May return to all normal activities 

## 2011-07-01 NOTE — Progress Notes (Signed)
Subjective:     Patient ID: Patrick Hatfield, male   DOB: [birth year], 76 y.o.   MRN: [id number]  HPI The patient is a 76 year old white male who is now about 6 weeks out from a laparoscopic cholecystectomy. His postoperative course was complicated by some diarrhea. We placed him on cholestyramine but it only took a couple of doses to resolve the diarrhea. He feels good now has no complaints  Review of Systems     Objective:   Physical Exam On exam his abdomen is soft and nontender. His incisions are healing nicely. His diarrhea has resolved    Assessment:     6 weeks status post laparoscopic cholecystectomy    Plan:     At this point I believe he can return to his normal activities without any restrictions. We'll plan to see him back on a p.r.n. basis

## 2011-07-07 ENCOUNTER — Other Ambulatory Visit: Payer: Self-pay | Admitting: Internal Medicine

## 2011-07-15 ENCOUNTER — Ambulatory Visit (INDEPENDENT_AMBULATORY_CARE_PROVIDER_SITE_OTHER): Payer: Medicare Other

## 2011-07-15 DIAGNOSIS — I82409 Acute embolism and thrombosis of unspecified deep veins of unspecified lower extremity: Secondary | ICD-10-CM

## 2011-07-15 DIAGNOSIS — Z7901 Long term (current) use of anticoagulants: Secondary | ICD-10-CM

## 2011-07-15 LAB — POCT INR: INR: 3.2

## 2011-07-15 NOTE — Patient Instructions (Signed)
2.5mg  daily except 5mg  on M,W,F  Recheck in 2 weeks

## 2011-07-28 ENCOUNTER — Other Ambulatory Visit: Payer: Self-pay | Admitting: Internal Medicine

## 2011-07-28 MED ORDER — LOSARTAN POTASSIUM-HCTZ 100-12.5 MG PO TABS: ORAL_TABLET | ORAL | Status: DC

## 2011-07-28 NOTE — Telephone Encounter (Signed)
RX sent

## 2011-07-29 ENCOUNTER — Ambulatory Visit (INDEPENDENT_AMBULATORY_CARE_PROVIDER_SITE_OTHER): Payer: Medicare Other

## 2011-07-29 DIAGNOSIS — Z7901 Long term (current) use of anticoagulants: Secondary | ICD-10-CM

## 2011-07-29 DIAGNOSIS — Z86718 Personal history of other venous thrombosis and embolism: Secondary | ICD-10-CM

## 2011-07-29 LAB — POCT INR
INR: 1.8
INR: 1.8

## 2011-07-29 NOTE — Patient Instructions (Signed)
Per Dr.Hopper have patient switch back to what he was originally taking 2.5mg  M/W/F and 5mg  all other days. Recheck in 4 weeks

## 2011-08-26 ENCOUNTER — Telehealth: Payer: Self-pay

## 2011-08-26 NOTE — Telephone Encounter (Signed)
Pt states he has runny nose sinus congestion, no fever, slight headache.  Pt has been taking Mucinex OTC any more suggestions.  Pt offered appt and pt declined.  Pt would like to know if Dr. Alwyn Ren has any suggestions for OTC meds. Pls advise.

## 2011-08-26 NOTE — Telephone Encounter (Signed)
Plain Mucinex for thick secretions ;force NON dairy fluids . Use a Neti pot daily as needed for sinus congestion. Zicam Melts or Zinc lozenges ; vitamin C 2000 mg daily; & Echinacea for 4-7 days. appt for  fever, exudate("pus") or progressive pain.

## 2011-08-27 NOTE — Telephone Encounter (Signed)
Patient informed of Dr.Hopper's recommendations

## 2011-08-29 ENCOUNTER — Ambulatory Visit (INDEPENDENT_AMBULATORY_CARE_PROVIDER_SITE_OTHER): Payer: Medicare Other

## 2011-08-29 DIAGNOSIS — Z86718 Personal history of other venous thrombosis and embolism: Secondary | ICD-10-CM

## 2011-08-29 DIAGNOSIS — Z5181 Encounter for therapeutic drug level monitoring: Secondary | ICD-10-CM

## 2011-08-29 DIAGNOSIS — Z7901 Long term (current) use of anticoagulants: Secondary | ICD-10-CM

## 2011-08-29 NOTE — Patient Instructions (Addendum)
2.5mg  on M/F, 5mg  all other days. Recheck in 4 weeks.

## 2011-09-10 ENCOUNTER — Encounter: Payer: Self-pay | Admitting: Internal Medicine

## 2011-09-10 ENCOUNTER — Ambulatory Visit (INDEPENDENT_AMBULATORY_CARE_PROVIDER_SITE_OTHER): Payer: Medicare Other | Admitting: Internal Medicine

## 2011-09-10 VITALS — BP 126/86 | HR 75 | Temp 98.2°F | Wt 217.6 lb

## 2011-09-10 DIAGNOSIS — H1030 Unspecified acute conjunctivitis, unspecified eye: Secondary | ICD-10-CM

## 2011-09-10 DIAGNOSIS — H10029 Other mucopurulent conjunctivitis, unspecified eye: Secondary | ICD-10-CM

## 2011-09-10 MED ORDER — ERYTHROMYCIN 5 MG/GM OP OINT: TOPICAL_OINTMENT | OPHTHALMIC | Status: DC

## 2011-09-10 NOTE — Patient Instructions (Signed)
To ER if pain persists or is associaled with Warning Signs as discussed. 

## 2011-09-10 NOTE — Progress Notes (Signed)
  Subjective:    Patient ID: Patrick Hatfield, male    DOB: 12/26/1934, 77 y.o.   MRN: [id number]  HPI EYE COMPLAINT  Onset: 1/21 as watering & redness OD ; by evening sprewad to OS Symptoms Discharge: yes, yellow Pain: no Photophobia: yes,  Decreased Vision: yes,  URI symptoms: some symptoms > 1 week ago w/o fever or purulence Itching/Allergy sxs: yes, mild Glaucoma: no Recent eye surgery: no Contact lens use: no  Red Flags Trauma: no Foreign Body: no Vomiting/HA: no  His wife had a similar problem several weeks ago which was treated with response       Review of Systems     Objective:   Physical Exam General appearance:good health ;well nourished; no acute distress or increased work of breathing is present.  No  lymphadenopathy about the head, neck, or axilla noted.   Eyes: Marked conjunctival inflammation with slight  lid edema is present. There is marked scleritis. Scant purulence at the medial aspect of the left. Extraocular motion intact. Vision intact.  Ears:  External ear exam shows no significant lesions or deformities.  Otoscopic examination reveals clear canals, tympanic membranes are intact bilaterally without bulging, retraction, inflammation or discharge. TMs dull  Nose:  External nasal examination shows no deformity or inflammation. Nasal mucosa are pink and moist without lesions or exudates. No septal dislocation or deviation.No obstruction to airflow.   Oral exam: Dental hygiene is good; lips and gums are healthy appearing.There is moderate  oropharyngeal erythema w/o exudate noted.  (ST denied)  Neck:  No deformities,  masses, or tenderness noted.   Supple with full range of motion without pain.   Heart:  Normal rate and regular rhythm. S1 and S2 normal without gallop, murmur, click, rub or other extra sounds.  S 4 with slurring  Lungs:Chest clear to auscultation; no wheezes, rhonchi,rales ,or rubs present.No increased work of breathing.    Extremities:   No cyanosis, edema, or clubbing  noted    Skin: Warm & dry . Mild erythema of lids          Assessment & Plan:   #1 conjunctivitis, infectious  Plan: See orders and recommendations

## 2011-09-24 ENCOUNTER — Other Ambulatory Visit: Payer: Self-pay | Admitting: Internal Medicine

## 2011-09-24 NOTE — Telephone Encounter (Signed)
Agents like Meloxicam  should be not be taken with warfarin . Even if not on warfarin,regular use of these agents significantly increase risk of gastric bleeding and acute cardiac events or stroke.  [employer]

## 2011-09-24 NOTE — Telephone Encounter (Signed)
Dr.Hopper please advise, when rx'ing med a warning populated stating interaction between Warfarin and Meloxicam (warning was printed and placed on ledge for review), please advise if in the future it is ok to override this warning

## 2011-09-25 NOTE — Telephone Encounter (Signed)
Refill done.  

## 2011-09-26 ENCOUNTER — Ambulatory Visit (INDEPENDENT_AMBULATORY_CARE_PROVIDER_SITE_OTHER): Payer: Medicare Other

## 2011-09-26 DIAGNOSIS — Z7901 Long term (current) use of anticoagulants: Secondary | ICD-10-CM

## 2011-09-26 DIAGNOSIS — Z86718 Personal history of other venous thrombosis and embolism: Secondary | ICD-10-CM

## 2011-09-26 NOTE — Patient Instructions (Addendum)
Patient was informed about Meloxicam and Warfarin drug reaction, patient states he has taken these med together x 1 year. Meloxicam rx'ed by Dr.Applington. Drug interaction report faxed to Dr.Applington at [phone].  PT/INR Instructions: No coumadin today, 5mg  on M/W/F/Sun, 2.5mg  on T/Th/Sat, recheck in 4 weeks

## 2011-10-24 ENCOUNTER — Ambulatory Visit (INDEPENDENT_AMBULATORY_CARE_PROVIDER_SITE_OTHER): Payer: Medicare Other | Admitting: *Deleted

## 2011-10-24 DIAGNOSIS — Z86718 Personal history of other venous thrombosis and embolism: Secondary | ICD-10-CM

## 2011-10-24 DIAGNOSIS — Z7901 Long term (current) use of anticoagulants: Secondary | ICD-10-CM

## 2011-10-24 LAB — POCT INR: INR: 2.8

## 2011-10-24 NOTE — Patient Instructions (Addendum)
Return to office in 4 weeks PT/INR  Continue current dose: 5mg  daily except 2.5mg  on T/Th/Sat

## 2011-10-29 ENCOUNTER — Encounter: Payer: Self-pay | Admitting: Internal Medicine

## 2011-11-01 ENCOUNTER — Ambulatory Visit (INDEPENDENT_AMBULATORY_CARE_PROVIDER_SITE_OTHER): Payer: Medicare Other | Admitting: Internal Medicine

## 2011-11-01 VITALS — BP 145/80 | HR 90 | Temp 98.7°F | Resp 18 | Ht 70.0 in | Wt 211.8 lb

## 2011-11-01 DIAGNOSIS — J40 Bronchitis, not specified as acute or chronic: Secondary | ICD-10-CM

## 2011-11-01 DIAGNOSIS — D6859 Other primary thrombophilia: Secondary | ICD-10-CM

## 2011-11-01 MED ORDER — AZITHROMYCIN 250 MG PO TABS: ORAL_TABLET | ORAL | Status: AC

## 2011-11-01 MED ORDER — HYDROCODONE-ACETAMINOPHEN 7.5-500 MG/15ML PO SOLN: 5.0000 mL | Freq: Four times a day (QID) | ORAL | Status: AC | PRN

## 2011-11-01 NOTE — Progress Notes (Signed)
  Subjective:    Patient ID: Patrick Hatfield, male    DOB: [birth year], 77 y.o.   MRN: [id number]  HPI Has cough, chest congestion, no sob.   Review of Systems Complex and reviewed    Objective:   Physical Exam Lungs coarse BS Heart RR HEENT rhinorrhea       Assessment & Plan:   zpak lortab elixir

## 2011-11-01 NOTE — Patient Instructions (Signed)

## 2011-11-04 ENCOUNTER — Other Ambulatory Visit: Payer: Self-pay | Admitting: Internal Medicine

## 2011-11-21 ENCOUNTER — Ambulatory Visit: Payer: Medicare Other

## 2011-11-24 ENCOUNTER — Ambulatory Visit (INDEPENDENT_AMBULATORY_CARE_PROVIDER_SITE_OTHER): Payer: Medicare Other | Admitting: *Deleted

## 2011-11-24 VITALS — BP 98/54 | HR 73 | Temp 98.1°F | Wt 216.0 lb

## 2011-11-24 DIAGNOSIS — Z86718 Personal history of other venous thrombosis and embolism: Secondary | ICD-10-CM

## 2011-11-24 DIAGNOSIS — Z7901 Long term (current) use of anticoagulants: Secondary | ICD-10-CM

## 2011-11-24 NOTE — Patient Instructions (Signed)
Return to office in 4 weeks   New dosing: Take 2.5 mg today then resume 5 mg daily except 2.5mg  on T/Th/Sat

## 2011-11-28 ENCOUNTER — Other Ambulatory Visit: Payer: Self-pay | Admitting: Internal Medicine

## 2011-12-01 ENCOUNTER — Encounter: Payer: Self-pay | Admitting: Internal Medicine

## 2011-12-05 ENCOUNTER — Other Ambulatory Visit: Payer: Self-pay | Admitting: Internal Medicine

## 2012-01-07 ENCOUNTER — Ambulatory Visit: Payer: Medicare Other

## 2012-01-07 ENCOUNTER — Encounter: Payer: Self-pay | Admitting: Gastroenterology

## 2012-01-09 ENCOUNTER — Encounter: Payer: Self-pay | Admitting: Internal Medicine

## 2012-01-09 ENCOUNTER — Ambulatory Visit (INDEPENDENT_AMBULATORY_CARE_PROVIDER_SITE_OTHER): Payer: Medicare Other

## 2012-01-09 ENCOUNTER — Ambulatory Visit (INDEPENDENT_AMBULATORY_CARE_PROVIDER_SITE_OTHER): Payer: Medicare Other | Admitting: Internal Medicine

## 2012-01-09 VITALS — BP 135/75 | HR 74 | Temp 98.2°F | Ht 71.0 in | Wt 208.0 lb

## 2012-01-09 DIAGNOSIS — Z7901 Long term (current) use of anticoagulants: Secondary | ICD-10-CM

## 2012-01-09 DIAGNOSIS — R6889 Other general symptoms and signs: Secondary | ICD-10-CM

## 2012-01-09 DIAGNOSIS — D682 Hereditary deficiency of other clotting factors: Secondary | ICD-10-CM

## 2012-01-09 DIAGNOSIS — T887XXA Unspecified adverse effect of drug or medicament, initial encounter: Secondary | ICD-10-CM | POA: Insufficient documentation

## 2012-01-09 DIAGNOSIS — R140 Abdominal distension (gaseous): Secondary | ICD-10-CM

## 2012-01-09 DIAGNOSIS — K219 Gastro-esophageal reflux disease without esophagitis: Secondary | ICD-10-CM

## 2012-01-09 LAB — CBC WITH DIFFERENTIAL/PLATELET
Basophils Absolute: 0 10*3/uL (ref 0.0–0.1)
Eosinophils Absolute: 0.1 10*3/uL (ref 0.0–0.7)
Lymphocytes Relative: 33.3 % (ref 12.0–46.0)
MCHC: 33.7 g/dL (ref 30.0–36.0)
Neutrophils Relative %: 54.3 % (ref 43.0–77.0)
RDW: 13.9 % (ref 11.5–14.6)

## 2012-01-09 LAB — LIPASE: Lipase: 34 U/L (ref 11.0–59.0)

## 2012-01-09 LAB — AMYLASE: Amylase: 44 U/L (ref 27–131)

## 2012-01-09 MED ORDER — HYOSCYAMINE SULFATE 0.125 MG SL SUBL: 0.1250 mg | SUBLINGUAL_TABLET | SUBLINGUAL | Status: DC | PRN

## 2012-01-09 MED ORDER — RANITIDINE HCL 150 MG PO TABS: 150.0000 mg | ORAL_TABLET | Freq: Two times a day (BID) | ORAL | Status: DC

## 2012-01-09 NOTE — Progress Notes (Signed)
  Subjective:    Patient ID: Patrick Hatfield, male    DOB: [birth year], 77 y.o.   MRN: [id number]  HPI He presents with bloating up to all day for the last 2-3 months which has been progressive. It involves the area from the suprapubic area superiorly to the epigastric area. This is associated with loose to watery stools up to 4-5 times per day. Symptoms seem to be worse with any food intake. Zantac as needed may have been partially beneficial.  He states that this reminds him of the symptoms he had when he had ischemic bowel disease in 2011. 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 At the time of the ischemic bowel event he was: Warfarin.  He had a cholecystectomy in September 2012.    Review of Systems Nausea/Vomiting: no  Diarrhea: no frank diarrhea  Constipation: no  Melena/BRBPR:no  Hematemesis: no Anorexia: yes X 2 weeks, worse few days Fever/Chills: no Dysuria/ hematuria/pyuria: no Rash:no Wt loss:   loss 7-8 # over 3 weeks EtOH use: prev 3-4/ day ; none for this week w/o change  NSAIDs/ASA: no       Objective:   Physical Exam General appearance is one of good health and nourishment w/o distress.  Eyes: No conjunctival inflammation or scleral icterus is present.  Neck: Thyroid normal without enlargement or nodularity  Oral exam: Dental hygiene is good; lips and gums are healthy appearing.There is mild  uvula erythema w/o  exudate noted.   Heart:  Normal rate and regular rhythm. S1 and S2 normal without gallop, murmur, click, rub .S 4    Lungs:Chest clear to auscultation; no wheezes, rhonchi,rales ,or rubs present.No increased work of breathing.   Abdomen: bowel sounds normal, soft and non-tender without masses, organomegaly or hernias noted.  No guarding or rebound   Skin:Warm & dry.  Intact without suspicious lesions or rashes ; no jaundice or tenting  Lymphatic: No lymphadenopathy is noted about the head, neck, axilla areas.   Neuro: Alert and  oriented. Slight head tremor.             Assessment & Plan:  #1 bloating, diffuse. Historically and by food intake and possibly responsive to ranitidine. IBS versus low-grade pancreatic issues  #2 past medical history of ischemic gastrointestinal event while on warfarin. The location, ie diffusely mitigates against an ischemic process  #3 factor V deficiency; PT/INR slightly high at 2.9  Plan: See orders and recommendations

## 2012-01-09 NOTE — Patient Instructions (Signed)
PT/INR is slightly SUPRA therapeutic. Change warfarin dose as follows : 2.5 mg M,W,F, & Sun and 5 mg T, Th , & Sat with repeat PT/INR in 4 weeks  The triggers for rflux  include stress; the "aspirin family" ; alcohol; peppermint; and caffeine (coffee, tea, cola, and chocolate). The aspirin family would include aspirin and the nonsteroidal agents such as ibuprofen &  Naproxen. Tylenol would not cause reflux. If having symptoms  ; food & drink should be avoided for @ least 2 hours before going to bed.  Please try to go on My Chart within the next 24 hours to allow me to release the results directly to you.

## 2012-01-28 ENCOUNTER — Encounter: Payer: Self-pay | Admitting: *Deleted

## 2012-02-01 ENCOUNTER — Other Ambulatory Visit: Payer: Self-pay | Admitting: Internal Medicine

## 2012-02-03 ENCOUNTER — Ambulatory Visit: Payer: Medicare Other | Admitting: Gastroenterology

## 2012-02-04 ENCOUNTER — Other Ambulatory Visit: Payer: Self-pay | Admitting: Internal Medicine

## 2012-02-06 ENCOUNTER — Ambulatory Visit (INDEPENDENT_AMBULATORY_CARE_PROVIDER_SITE_OTHER): Payer: Medicare Other | Admitting: *Deleted

## 2012-02-06 VITALS — BP 140/82 | HR 76 | Temp 98.4°F | Wt 214.0 lb

## 2012-02-06 DIAGNOSIS — Z86718 Personal history of other venous thrombosis and embolism: Secondary | ICD-10-CM

## 2012-02-06 DIAGNOSIS — Z7901 Long term (current) use of anticoagulants: Secondary | ICD-10-CM

## 2012-02-06 NOTE — Patient Instructions (Signed)
Return to office in 4 weeks  Continue current dose: 2.5 mg M,W,F, & Sun and 5 mg T, Th , & Sat

## 2012-02-20 ENCOUNTER — Telehealth: Payer: Self-pay | Admitting: Internal Medicine

## 2012-02-20 ENCOUNTER — Ambulatory Visit (INDEPENDENT_AMBULATORY_CARE_PROVIDER_SITE_OTHER): Payer: Medicare Other | Admitting: Internal Medicine

## 2012-02-20 ENCOUNTER — Encounter: Payer: Self-pay | Admitting: Internal Medicine

## 2012-02-20 VITALS — BP 138/86 | HR 73 | Temp 98.1°F | Wt 211.6 lb

## 2012-02-20 DIAGNOSIS — D682 Hereditary deficiency of other clotting factors: Secondary | ICD-10-CM

## 2012-02-20 DIAGNOSIS — R319 Hematuria, unspecified: Secondary | ICD-10-CM

## 2012-02-20 LAB — CBC WITH DIFFERENTIAL/PLATELET
Basophils Relative: 1 % (ref 0.0–3.0)
Eosinophils Absolute: 0.2 10*3/uL (ref 0.0–0.7)
Eosinophils Relative: 2.8 % (ref 0.0–5.0)
Hemoglobin: 15.1 g/dL (ref 13.0–17.0)
Lymphocytes Relative: 45.2 % (ref 12.0–46.0)
Monocytes Relative: 9.5 % (ref 3.0–12.0)
Neutro Abs: 2.6 10*3/uL (ref 1.4–7.7)
Neutrophils Relative %: 41.5 % — ABNORMAL LOW (ref 43.0–77.0)
RBC: 4.45 Mil/uL (ref 4.22–5.81)
WBC: 6.3 10*3/uL (ref 4.5–10.5)

## 2012-02-20 LAB — POCT URINALYSIS DIPSTICK
Glucose, UA: NEGATIVE
Ketones, UA: NEGATIVE
Spec Grav, UA: 1.015
Urobilinogen, UA: 0.2

## 2012-02-20 LAB — POCT INR: INR: 2.7

## 2012-02-20 NOTE — Telephone Encounter (Signed)
Spoke with patient, patient verbalized understanding of labs

## 2012-02-20 NOTE — Progress Notes (Signed)
  Subjective:    Patient ID: Patrick Hatfield, male    DOB: 11-Sep-1934, 77 y.o.   MRN: [id number]  HPI  He has noted frank hematuria with every urination since 02/18/12 until this morning. He's also noted some small clots. This is not associated with pyuria or dysuria. He also denies fever, chills, sweats, or flank pain.  His last PT/INR was 2.2 on 6/21. Warfarin dose was not changed.  He has a past history of prostatic hypertrophy for which he had transurethral resection. His last PSA was 1.45 in June 2012. Past medical history/family history/social history were all reviewed and updated. Pertinent data: There is no family history of prostatic disease.    Review of Systems He specifically denies epistaxis, hemoptysis, rectal bleeding, or melena. He does have easy bruising and bleeding in the setting of his warfarin therapy for factor V deficiency.     Objective:   Physical Exam General appearance is one of good health and nourishment w/o distress.  Eyes: No conjunctival inflammation or scleral icterus is present.     Heart:  Normal rate and regular rhythm. S1 and S2 normal without gallop, murmur, click, rub or other extra sounds     Lungs:Chest clear to auscultation; no wheezes, rhonchi,rales ,or rubs present.No increased work of breathing.   Abdomen: bowel sounds normal, soft and non-tender without masses, organomegaly or hernias noted.  No guarding or rebound .  There is no tenderness to palpation over the flanks.  Genital exams unremarkable. Prostate is not enlarged; there is minimal asymmetry of the left lobe is insignificantly larger than the right. No nodules or induration are present. Hemoccult card was negative; stool collection was scant  Skin:Warm & dry.  Intact without suspicious lesions or rashes ; no jaundice or tenting  Lymphatic: No lymphadenopathy is noted about the head, neck, axilla, or inguinal areas.              Assessment & Plan:  #1 hematuria  #2  warfarin therapy for factor V deficiency  #3 past history of TURP for prostatic hypertrophy. No significant findings on genitourinary exam  Plan: #1 urinalysis and culture  #2 PT/INR  #3 standard of care concerning PSA monitor discussed. PSA not recommended based on negative exam.

## 2012-02-20 NOTE — Patient Instructions (Addendum)
PT/INR is high  therapeutic. Take 2.5 mg today AND TOMORROW; otherwise same schedule; repeat PT/INR in 2 weeks .Please try to go on My Chart within the next 24 hours to allow me to release the results directly to you.

## 2012-02-20 NOTE — Telephone Encounter (Signed)
Message copied by Maurice Small on Fri Feb 20, 2012  5:03 PM ------      Message from: Pecola Lawless      Created: Fri Feb 20, 2012  1:32 PM       Unfortunately I signed off on labs by mistake. There is no anemia; the platelet count is mildly reduced. This is not a significant concern but the combination of the mildly reduced platelet count and blood thinner may be contributing to the findings. Urine culture is pending.

## 2012-02-20 NOTE — Telephone Encounter (Signed)
Caller: Patrick Hatfield/Patient; PCP: Marga Melnick; CB#: [phone]; ; ; Call regarding Urinary Pain/Bleeding;  Onset- 02/18/12   Afebrile. Pt c/o of blood in the urine and he has seen some blood clots. Emergent s/s of Bloody urine protocol states see provider within 4hrs. Appt scheduled for today at 10:00am.

## 2012-02-22 LAB — URINE CULTURE: Colony Count: 2000

## 2012-03-05 ENCOUNTER — Ambulatory Visit (INDEPENDENT_AMBULATORY_CARE_PROVIDER_SITE_OTHER): Payer: Medicare Other | Admitting: *Deleted

## 2012-03-05 VITALS — BP 120/80 | HR 88 | Wt 212.0 lb

## 2012-03-05 DIAGNOSIS — Z7901 Long term (current) use of anticoagulants: Secondary | ICD-10-CM

## 2012-03-05 DIAGNOSIS — Z86718 Personal history of other venous thrombosis and embolism: Secondary | ICD-10-CM

## 2012-03-05 LAB — POCT INR: INR: 3

## 2012-03-05 NOTE — Patient Instructions (Signed)
Return to office in 3 weeks  New dosing: 2.5 mg daily except 5 mg Tues,sat

## 2012-03-24 ENCOUNTER — Encounter: Payer: Self-pay | Admitting: Internal Medicine

## 2012-03-24 ENCOUNTER — Other Ambulatory Visit: Payer: Self-pay | Admitting: Internal Medicine

## 2012-03-26 ENCOUNTER — Ambulatory Visit (INDEPENDENT_AMBULATORY_CARE_PROVIDER_SITE_OTHER): Payer: Medicare Other | Admitting: *Deleted

## 2012-03-26 VITALS — BP 130/84 | HR 73 | Wt 212.0 lb

## 2012-03-26 DIAGNOSIS — Z86718 Personal history of other venous thrombosis and embolism: Secondary | ICD-10-CM

## 2012-03-26 DIAGNOSIS — Z7901 Long term (current) use of anticoagulants: Secondary | ICD-10-CM

## 2012-03-26 LAB — POCT INR: INR: 1.8

## 2012-04-05 ENCOUNTER — Ambulatory Visit (INDEPENDENT_AMBULATORY_CARE_PROVIDER_SITE_OTHER): Payer: Medicare Other | Admitting: *Deleted

## 2012-04-05 VITALS — BP 138/70 | HR 65 | Wt 215.0 lb

## 2012-04-05 DIAGNOSIS — Z7901 Long term (current) use of anticoagulants: Secondary | ICD-10-CM

## 2012-04-05 DIAGNOSIS — Z86718 Personal history of other venous thrombosis and embolism: Secondary | ICD-10-CM

## 2012-04-05 NOTE — Patient Instructions (Signed)
Return to office in 4 weeks  Continue current dosing:  5mg  tues,sat and 2.5 mg daily

## 2012-04-06 ENCOUNTER — Other Ambulatory Visit: Payer: Self-pay | Admitting: Gastroenterology

## 2012-04-26 ENCOUNTER — Other Ambulatory Visit: Payer: Self-pay | Admitting: Internal Medicine

## 2012-04-30 ENCOUNTER — Encounter: Payer: Self-pay | Admitting: Internal Medicine

## 2012-05-03 ENCOUNTER — Ambulatory Visit (INDEPENDENT_AMBULATORY_CARE_PROVIDER_SITE_OTHER): Payer: Medicare Other

## 2012-05-03 VITALS — BP 118/78 | HR 66 | Wt 218.4 lb

## 2012-05-03 DIAGNOSIS — Z7901 Long term (current) use of anticoagulants: Secondary | ICD-10-CM

## 2012-05-03 DIAGNOSIS — Z86718 Personal history of other venous thrombosis and embolism: Secondary | ICD-10-CM

## 2012-05-03 DIAGNOSIS — Z23 Encounter for immunization: Secondary | ICD-10-CM

## 2012-05-03 LAB — POCT INR: INR: 2.2

## 2012-05-03 NOTE — Patient Instructions (Signed)
Current taking 2.5 mg daily EXCEPT 5 mg on Tue/Sat. (missed 1 dose 2.5 mg about 2 weeks ago). Per MD no change and recheck in 4 weeks

## 2012-05-13 ENCOUNTER — Telehealth: Payer: Self-pay | Admitting: *Deleted

## 2012-05-13 NOTE — Telephone Encounter (Signed)
Patient confirmed over the phone the new date and time in 07-13-2012

## 2012-05-31 ENCOUNTER — Ambulatory Visit (INDEPENDENT_AMBULATORY_CARE_PROVIDER_SITE_OTHER): Payer: Medicare Other

## 2012-05-31 VITALS — BP 130/78 | HR 74 | Wt 217.8 lb

## 2012-05-31 DIAGNOSIS — Z86718 Personal history of other venous thrombosis and embolism: Secondary | ICD-10-CM

## 2012-05-31 DIAGNOSIS — Z7901 Long term (current) use of anticoagulants: Secondary | ICD-10-CM

## 2012-05-31 LAB — POCT INR: INR: 3.2

## 2012-05-31 NOTE — Patient Instructions (Addendum)
Per Dr.Hopper 2.5 mg today and tomorrow, then resume regular schedule 5 mg on Tue/Sat, 2.5 mg all other days and recheck in 4 weeks

## 2012-06-01 ENCOUNTER — Other Ambulatory Visit: Payer: Self-pay | Admitting: Internal Medicine

## 2012-06-03 ENCOUNTER — Encounter: Payer: Self-pay | Admitting: Internal Medicine

## 2012-06-21 ENCOUNTER — Encounter: Payer: Self-pay | Admitting: Family Medicine

## 2012-06-21 ENCOUNTER — Telehealth: Payer: Self-pay | Admitting: Internal Medicine

## 2012-06-21 ENCOUNTER — Ambulatory Visit (INDEPENDENT_AMBULATORY_CARE_PROVIDER_SITE_OTHER): Payer: Medicare Other | Admitting: Family Medicine

## 2012-06-21 VITALS — BP 120/86 | HR 75 | Temp 98.3°F | Resp 16 | Wt 216.0 lb

## 2012-06-21 DIAGNOSIS — J209 Acute bronchitis, unspecified: Secondary | ICD-10-CM

## 2012-06-21 MED ORDER — AMOXICILLIN 875 MG PO TABS: 875.0000 mg | ORAL_TABLET | Freq: Two times a day (BID) | ORAL | Status: DC

## 2012-06-21 MED ORDER — GUAIFENESIN-CODEINE 100-10 MG/5ML PO SYRP: 10.0000 mL | ORAL_SOLUTION | Freq: Three times a day (TID) | ORAL | Status: DC | PRN

## 2012-06-21 NOTE — Progress Notes (Signed)
  Subjective:    Patient ID: Patrick Hatfield, male    DOB: Sep 01, 1934, 77 y.o.   MRN: [id number]  HPI Sinus pressure- sxs started 5-6 days ago w/ nasal congestion.  Has progressed.  Now having sharp pain.  Now w/ nonproductive cough since yesterday.  Hx of recurrent bronchitis.  No fevers.  No ear pain.  No known sick contacts.  Taking Aleve Sinus.   Review of Systems For ROS see HPI     Objective:   Physical Exam  Vitals reviewed. Constitutional: He appears well-developed and well-nourished. No distress.  HENT:  Head: Normocephalic and atraumatic.  Right Ear: Tympanic membrane normal.  Left Ear: Tympanic membrane normal.  Nose: No mucosal edema or rhinorrhea. Right sinus exhibits no maxillary sinus tenderness and no frontal sinus tenderness. Left sinus exhibits no maxillary sinus tenderness and no frontal sinus tenderness.  Mouth/Throat: Mucous membranes are normal. No oropharyngeal exudate, posterior oropharyngeal edema or posterior oropharyngeal erythema.  Eyes: Conjunctivae normal and EOM are normal. Pupils are equal, round, and reactive to light.  Neck: Normal range of motion. Neck supple.  Cardiovascular: Normal rate, regular rhythm and normal heart sounds.   Pulmonary/Chest: Effort normal and breath sounds normal. No respiratory distress. He has no wheezes.       + hacking cough  Lymphadenopathy:    He has no cervical adenopathy.  Skin: Skin is warm and dry.          Assessment & Plan:

## 2012-06-21 NOTE — Patient Instructions (Addendum)
Start the Amoxicillin to cover for sinus infection and bronchitis Use the cough syrup as needed- will make you drowsy Mucinex DM during the day Drink plenty of fluids REST! Hang in there!!!

## 2012-06-21 NOTE — Assessment & Plan Note (Signed)
New.  Start amox as pt is also having sxs of early sinusitis.  Cough meds prn.  Reviewed supportive care and red flags that should prompt return.  Pt expressed understanding and is in agreement w/ plan.

## 2012-06-21 NOTE — Telephone Encounter (Signed)
Caller: [name]/Patient; Patient Name: Patrick Hatfield; PCP: Marga Melnick; Best Callback Phone Number: [phone] Pt is caling with congestion/cough since Thursday 10/31. Pt states he is having sinus pain and congestion. No fever. Pt is blowing out yellow. Pt wants an appt today. Rn scheduled pt at 1:15 with Dr. Beverely Low.

## 2012-06-28 ENCOUNTER — Ambulatory Visit: Payer: Medicare Other

## 2012-07-13 ENCOUNTER — Ambulatory Visit (HOSPITAL_BASED_OUTPATIENT_CLINIC_OR_DEPARTMENT_OTHER): Payer: Medicare Other | Admitting: Oncology

## 2012-07-13 ENCOUNTER — Telehealth: Payer: Self-pay | Admitting: Oncology

## 2012-07-13 ENCOUNTER — Other Ambulatory Visit (HOSPITAL_BASED_OUTPATIENT_CLINIC_OR_DEPARTMENT_OTHER): Payer: Medicare Other | Admitting: Lab

## 2012-07-13 VITALS — BP 115/87 | HR 85 | Temp 98.2°F | Resp 20 | Ht 71.0 in | Wt 215.8 lb

## 2012-07-13 DIAGNOSIS — Z86718 Personal history of other venous thrombosis and embolism: Secondary | ICD-10-CM

## 2012-07-13 DIAGNOSIS — Z7901 Long term (current) use of anticoagulants: Secondary | ICD-10-CM

## 2012-07-13 DIAGNOSIS — D682 Hereditary deficiency of other clotting factors: Secondary | ICD-10-CM

## 2012-07-13 DIAGNOSIS — D6859 Other primary thrombophilia: Secondary | ICD-10-CM

## 2012-07-13 DIAGNOSIS — I2699 Other pulmonary embolism without acute cor pulmonale: Secondary | ICD-10-CM

## 2012-07-13 DIAGNOSIS — I82509 Chronic embolism and thrombosis of unspecified deep veins of unspecified lower extremity: Secondary | ICD-10-CM

## 2012-07-13 DIAGNOSIS — Z5181 Encounter for therapeutic drug level monitoring: Secondary | ICD-10-CM

## 2012-07-13 DIAGNOSIS — D696 Thrombocytopenia, unspecified: Secondary | ICD-10-CM

## 2012-07-13 DIAGNOSIS — D689 Coagulation defect, unspecified: Secondary | ICD-10-CM

## 2012-07-13 LAB — CBC WITH DIFFERENTIAL/PLATELET
BASO%: 0.8 % (ref 0.0–2.0)
HCT: 43.8 % (ref 38.4–49.9)
LYMPH%: 38.7 % (ref 14.0–49.0)
MCH: 34.1 pg — ABNORMAL HIGH (ref 27.2–33.4)
MCHC: 34.6 g/dL (ref 32.0–36.0)
MONO#: 0.5 10*3/uL (ref 0.1–0.9)
NEUT%: 52.7 % (ref 39.0–75.0)
Platelets: 125 10*3/uL — ABNORMAL LOW (ref 140–400)
WBC: 7.5 10*3/uL (ref 4.0–10.3)

## 2012-07-13 NOTE — Patient Instructions (Signed)
Call if you plan orthopedic surgery to coordinate anticoagulation

## 2012-07-13 NOTE — Progress Notes (Signed)
Hematology and Oncology Follow Up Visit  Patrick Hatfield [id number] 1935-04-01 77 y.o. 07/13/2012 6:04 PM   Principle Diagnosis: Encounter Diagnoses  Name Primary?  . FACTOR V DEFICIENCY   . DVT, HX OF Yes  . Chronic anticoagulation      Interim History:    Followup visit for this pleasant 77 year old man I initially evaluated in this office in August 2010 subsequent to a hospital consultation to evaluate recurrent thrombosis and mild thrombocytopenia.  He presented with acute superior mesenteric vein thrombosis at that time with a previous history of a lower extremity DVT.  He was found to be a heterozygote for the Factor V Leiden gene mutation.  He has mild thrombocytopenia never less than 100,000, likely due to chronic alcohol use.    I recommended long-term anticoagulation.  I have seen him periodically since that time.   He had a laparoscopic cholecystectomy back in September by Dr. Carolynne Edouard. Coumadin anticoagulation was bridged with Lovenox perioperatively and he did well. He's had no other interim medical problems.  He denies any chest pain or pressure, no dyspnea, no leg swelling or pain.    Medications: reviewed  Allergies:  Allergies  Allergen Reactions  . Amlodipine Besy-Benazepril Hcl     REACTION: swelling of feet.  Probably  from Amlodipine component)  . Lipitor (Atorvastatin Calcium)     ? Reaction, ? swelling    Review of Systems: Constitutional:   No constitutional symptoms Respiratory: See above Cardiovascular:  See above Gastrointestinal: No abdominal pain or change in bowel habit Genito-Urinary: No urinary tract symptoms Musculoskeletal: Orthopedic problems with his right knee. He is due to see Dr. Leslee Home tomorrow. He has had previous laparoscopic surgery and may need a knee replacement Neurologic: No headache or change in vision Skin: No rash or ecchymosis Remaining ROS negative.  Physical Exam: Blood pressure 115/87, pulse 85, temperature 98.2 F  (36.8 C), temperature source Oral, resp. rate 20, height 5\' 11"  (1.803 m), weight 215 lb 12.8 oz (97.886 kg). Wt Readings from Last 3 Encounters:  07/13/12 215 lb 12.8 oz (97.886 kg)  06/21/12 216 lb (97.977 kg)  05/31/12 217 lb 12.8 oz (98.793 kg)     General appearance: Well-nourished Caucasian man HENNT: Pharynx no erythema or exudate Lymph nodes: No adenopathy Breasts: Lungs: Clear to auscultation resonant to percussion Heart: Regular rhythm 1-2/6 systolic murmur left sternal border Abdomen: Soft, nontender, no mass, no organomegaly Extremities: No edema, no calf tenderness Vascular: No cyanosis Neurologic: No focal deficit Skin: No rash or ecchymosis  Lab Results: Lab Results  Component Value Date   WBC 7.5 07/13/2012   HGB 15.2 07/13/2012   HCT 43.8 07/13/2012   MCV 98.5* 07/13/2012   PLT 125* 07/13/2012     Chemistry      Component Value Date/Time   NA 139 05/19/2011 1058   NA 139 05/19/2011 1058   K 4.2 05/19/2011 1058   K 4.2 05/19/2011 1058   CL 101 05/19/2011 1058   CL 101 05/19/2011 1058   CO2 27 05/19/2011 1058   CO2 27 05/19/2011 1058   BUN 10 05/19/2011 1058   BUN 10 05/19/2011 1058   CREATININE 0.92 05/19/2011 1058   CREATININE 0.92 05/19/2011 1058      Component Value Date/Time   CALCIUM 9.8 05/19/2011 1058   CALCIUM 9.8 05/19/2011 1058   ALKPHOS 55 05/19/2011 1058   ALKPHOS 55 05/19/2011 1058   AST 40* 05/19/2011 1058   AST 40* 05/19/2011 1058   ALT 51  05/19/2011 1058   ALT 51 05/19/2011 1058   BILITOT 1.8* 05/19/2011 1058   BILITOT 1.8* 05/19/2011 1058      Impression and Plan: #1. Coagulopathy secondary to factor V Leiden heterozygote status  #2. History of metachronous DVT then subsequent pulmonary embolus secondary to #1. Plan: Continue chronic anticoagulation  #3. Status post recent uncomplicated cholecystectomy 9/13  #4. Ongoing orthopedic problems right knee-May need knee replacement surgery He will contact me if in fact the surgery get scheduled  so I can work with his surgeon with respect to management of perioperative anticoagulation  #5. Mild, chronic, thrombocytopenia stable over time. May be related to chronic alcohol consumption.   CC:. Dr. Marga Melnick; Dr. Tomasa Blase   Patrick Feinstein, MD 11/26/20136:04 PM

## 2012-07-13 NOTE — Telephone Encounter (Signed)
gv and printed pt appt schedule for NOV 2014 °

## 2012-07-27 ENCOUNTER — Other Ambulatory Visit: Payer: Self-pay | Admitting: Internal Medicine

## 2012-07-31 ENCOUNTER — Other Ambulatory Visit: Payer: Self-pay | Admitting: Internal Medicine

## 2012-08-04 ENCOUNTER — Other Ambulatory Visit: Payer: Self-pay | Admitting: Internal Medicine

## 2012-08-21 ENCOUNTER — Encounter: Payer: Self-pay | Admitting: Internal Medicine

## 2012-08-31 ENCOUNTER — Ambulatory Visit (INDEPENDENT_AMBULATORY_CARE_PROVIDER_SITE_OTHER): Payer: Medicare Other | Admitting: *Deleted

## 2012-08-31 VITALS — BP 120/80 | HR 84 | Wt 220.0 lb

## 2012-08-31 DIAGNOSIS — Z7901 Long term (current) use of anticoagulants: Secondary | ICD-10-CM

## 2012-08-31 DIAGNOSIS — Z86718 Personal history of other venous thrombosis and embolism: Secondary | ICD-10-CM

## 2012-08-31 LAB — POCT INR: INR: 1.9

## 2012-08-31 NOTE — Patient Instructions (Addendum)
Return to office in 4 weeks  New dosing:  Take 7.5 mg today then 2.5 mg daily except 5mg  Tuesday, Thursday, and Saturday

## 2012-09-23 ENCOUNTER — Encounter: Payer: Self-pay | Admitting: Internal Medicine

## 2012-09-24 ENCOUNTER — Encounter: Payer: Self-pay | Admitting: Internal Medicine

## 2012-09-28 ENCOUNTER — Ambulatory Visit (INDEPENDENT_AMBULATORY_CARE_PROVIDER_SITE_OTHER): Payer: Medicare Other

## 2012-09-28 VITALS — BP 124/82 | HR 72 | Wt 218.8 lb

## 2012-09-28 DIAGNOSIS — Z7901 Long term (current) use of anticoagulants: Secondary | ICD-10-CM

## 2012-09-28 DIAGNOSIS — Z86718 Personal history of other venous thrombosis and embolism: Secondary | ICD-10-CM

## 2012-09-28 LAB — POCT INR: INR: 2.5

## 2012-09-28 NOTE — Patient Instructions (Addendum)
Current dose 5 mg on T/Th/Sat, and 2.5 mg all other days, per Dr.Hopper: NO change and recheck in 4 weeks

## 2012-10-21 ENCOUNTER — Telehealth: Payer: Self-pay | Admitting: Internal Medicine

## 2012-10-21 MED ORDER — WARFARIN SODIUM 5 MG PO TABS: ORAL_TABLET | ORAL | Status: DC

## 2012-10-21 NOTE — Telephone Encounter (Signed)
refill Warfarin Sodium (Tab) 5 MG TAKE 1 TABLET BY MOUTH DAILY. AS DIRECTED BASED ON PT/INR READING #30 wt/1-refill last fill 1.30.14

## 2012-10-23 ENCOUNTER — Other Ambulatory Visit: Payer: Self-pay | Admitting: Internal Medicine

## 2012-10-25 ENCOUNTER — Encounter: Payer: Self-pay | Admitting: Emergency Medicine

## 2012-10-25 NOTE — Telephone Encounter (Signed)
Left message on VM informing patient he will need to stop by the office to sign controlled substance contract and pick up rx. RX and contract placed at the front desk

## 2012-10-25 NOTE — Telephone Encounter (Signed)
Phone lines are currently down, I will have to contact patient to have him sign a contract and pick up rx

## 2012-10-26 ENCOUNTER — Ambulatory Visit (INDEPENDENT_AMBULATORY_CARE_PROVIDER_SITE_OTHER): Payer: Medicare Other

## 2012-10-26 VITALS — BP 130/82 | HR 72 | Wt 220.0 lb

## 2012-10-26 DIAGNOSIS — Z86718 Personal history of other venous thrombosis and embolism: Secondary | ICD-10-CM

## 2012-10-26 DIAGNOSIS — Z7901 Long term (current) use of anticoagulants: Secondary | ICD-10-CM

## 2012-10-26 LAB — POCT INR: INR: 3.4

## 2012-10-26 NOTE — Patient Instructions (Addendum)
Current dose:5 mg T/TH/Sat and 2.5 mg all other days  New Regimen:  (Per Dr.Hopper) 2.5 mg TODAY, then 2.5 mg daily EXCEPT 5 on Tu/Thurs

## 2012-11-03 ENCOUNTER — Other Ambulatory Visit: Payer: Self-pay | Admitting: Internal Medicine

## 2012-11-15 ENCOUNTER — Other Ambulatory Visit: Payer: Self-pay | Admitting: Internal Medicine

## 2012-11-16 ENCOUNTER — Ambulatory Visit (INDEPENDENT_AMBULATORY_CARE_PROVIDER_SITE_OTHER): Payer: Medicare Other | Admitting: *Deleted

## 2012-11-16 VITALS — BP 120/70 | HR 73 | Temp 98.1°F | Wt 216.0 lb

## 2012-11-16 DIAGNOSIS — Z86718 Personal history of other venous thrombosis and embolism: Secondary | ICD-10-CM

## 2012-11-16 DIAGNOSIS — Z7901 Long term (current) use of anticoagulants: Secondary | ICD-10-CM

## 2012-11-16 LAB — POCT INR: INR: 2.1

## 2012-11-16 NOTE — Patient Instructions (Signed)
Return to office in 3 weeks  New dosing: This week only take 2.5 mg daily except 5 mg Tuesday, Thursday, Saturday then resume 2.5 mg daily except 5 mg tues,thurs,

## 2012-12-07 ENCOUNTER — Other Ambulatory Visit: Payer: Self-pay | Admitting: Internal Medicine

## 2012-12-08 ENCOUNTER — Ambulatory Visit (INDEPENDENT_AMBULATORY_CARE_PROVIDER_SITE_OTHER): Payer: Medicare Other | Admitting: *Deleted

## 2012-12-08 VITALS — BP 130/82 | HR 104 | Wt 221.0 lb

## 2012-12-08 DIAGNOSIS — Z86718 Personal history of other venous thrombosis and embolism: Secondary | ICD-10-CM

## 2012-12-08 DIAGNOSIS — Z7901 Long term (current) use of anticoagulants: Secondary | ICD-10-CM

## 2012-12-08 LAB — POCT INR: INR: 2.8

## 2012-12-08 NOTE — Patient Instructions (Addendum)
Return to office in 4 weeks  Continue current dosing: 2.5 mg daily except 5 mg Tuesday and Thursday

## 2013-01-05 ENCOUNTER — Ambulatory Visit (INDEPENDENT_AMBULATORY_CARE_PROVIDER_SITE_OTHER): Payer: Medicare Other | Admitting: Internal Medicine

## 2013-01-05 ENCOUNTER — Encounter: Payer: Self-pay | Admitting: Internal Medicine

## 2013-01-05 DIAGNOSIS — Z86718 Personal history of other venous thrombosis and embolism: Secondary | ICD-10-CM

## 2013-01-05 DIAGNOSIS — R7309 Other abnormal glucose: Secondary | ICD-10-CM

## 2013-01-05 DIAGNOSIS — R339 Retention of urine, unspecified: Secondary | ICD-10-CM

## 2013-01-05 DIAGNOSIS — E785 Hyperlipidemia, unspecified: Secondary | ICD-10-CM

## 2013-01-05 DIAGNOSIS — Z7901 Long term (current) use of anticoagulants: Secondary | ICD-10-CM

## 2013-01-05 DIAGNOSIS — N401 Enlarged prostate with lower urinary tract symptoms: Secondary | ICD-10-CM

## 2013-01-05 DIAGNOSIS — I10 Essential (primary) hypertension: Secondary | ICD-10-CM

## 2013-01-05 DIAGNOSIS — K2270 Barrett's esophagus without dysplasia: Secondary | ICD-10-CM

## 2013-01-05 DIAGNOSIS — R350 Frequency of micturition: Secondary | ICD-10-CM

## 2013-01-05 DIAGNOSIS — D696 Thrombocytopenia, unspecified: Secondary | ICD-10-CM | POA: Insufficient documentation

## 2013-01-05 DIAGNOSIS — N138 Other obstructive and reflux uropathy: Secondary | ICD-10-CM

## 2013-01-05 LAB — BASIC METABOLIC PANEL
BUN: 16 mg/dL (ref 6–23)
Chloride: 101 mEq/L (ref 96–112)
GFR: 90.62 mL/min (ref >60.00)
Potassium: 3.6 mEq/L (ref 3.5–5.1)
Sodium: 138 mEq/L (ref 135–145)

## 2013-01-05 LAB — CBC WITH DIFFERENTIAL/PLATELET
Basophils Absolute: 0.1 10*3/uL (ref 0.0–0.1)
Eosinophils Relative: 3.5 % (ref 0.0–5.0)
Lymphocytes Relative: 42.8 % (ref 12.0–46.0)
Lymphs Abs: 2.5 10*3/uL (ref 0.7–4.0)
Monocytes Relative: 10.1 % (ref 3.0–12.0)
Neutrophils Relative %: 42.7 % — ABNORMAL LOW (ref 43.0–77.0)
Platelets: 126 10*3/uL — ABNORMAL LOW (ref 150.0–400.0)
RDW: 13.9 % (ref 11.5–14.6)
WBC: 5.9 10*3/uL (ref 4.5–10.5)

## 2013-01-05 LAB — POCT INR: INR: 2.2

## 2013-01-05 LAB — PSA: PSA: 1.59 ng/mL (ref 0.10–4.00)

## 2013-01-05 LAB — HEMOGLOBIN A1C: Hgb A1c MFr Bld: 5 % (ref 4.6–6.5)

## 2013-01-05 LAB — LIPID PANEL
HDL: 42 mg/dL (ref >39.00)
Total CHOL/HDL Ratio: 6
VLDL: 54.4 mg/dL — ABNORMAL HIGH (ref 0.0–40.0)

## 2013-01-05 LAB — HEPATIC FUNCTION PANEL
ALT: 73 U/L — ABNORMAL HIGH (ref 0–53)
AST: 58 U/L — ABNORMAL HIGH (ref 0–37)
Alkaline Phosphatase: 40 U/L (ref 39–117)
Bilirubin, Direct: 0.3 mg/dL (ref 0.0–0.3)
Total Bilirubin: 2.5 mg/dL — ABNORMAL HIGH (ref 0.3–1.2)

## 2013-01-05 NOTE — Patient Instructions (Addendum)
Please review the record and make any corrections; share this with all medical staff seen. If you note change or progression in urinary  symptoms please contact us through My Chart ASAP. This will allow [place] to respond as quickly as possible and schedule appropriate studies (blood test or imaging). PT/INR is therapeutic. No change in warfarin dose; repeat PT/INR in 4 weeks

## 2013-01-05 NOTE — Assessment & Plan Note (Signed)
A1c

## 2013-01-05 NOTE — Assessment & Plan Note (Signed)
Fasting lipids

## 2013-01-05 NOTE — Progress Notes (Signed)
  Subjective:    Patient ID: Patrick Hatfield, male    DOB: [birth year], 78 y.o.   MRN: [id number]  HPI HYPERTENSION: Disease Monitoring: Blood pressure range- 130s/70s Chest pain, palpitations- no       Dyspnea- no Medications: Compliance- yes  Lightheadedness,Syncope- no   Edema- no  FASTING HYPERGLYCEMIA, PMH of:  Polyuria/phagia/dipsia- no    Visual problems- no  HYPERLIPIDEMIA: Disease Monitoring: See symptoms for Hypertension Medications: Compliance- no statin Diet:low fat   GERD: Abd pain, bowel changes- no but loose bowels intermittently since cholecystectomy Hoarseness, dysphagia -no         Review of Systems  urgency & frequency & incomplete emptying  He denies dysuria, pyuria, or hematuria. He was previously evaluated for hematuria; evaluation was negative.  Muscle aches- no  , but occasional nocturnal cramps     Objective:   Physical Exam  Gen.:  well-nourished in appearance. Alert, appropriate and cooperative throughout exam.Appears younger than stated age  Head: Normocephalic without obvious abnormalities  Eyes: No corneal or conjunctival inflammation noted.  Extraocular motion intact. Vision grossly normal with lenses Ears:  No wax; TM's dull.Hearing is grossly grossly bilaterally. Nose: External nasal exam reveals no deformity or inflammation. Nasal mucosa are pink and moist. No lesions or exudates noted.  Mouth: Oral mucosa and oropharynx reveal no lesions or exudates. Teeth in good repair.Minimal erythema Neck: No deformities, masses, or tenderness noted. Range of motion &Thyroid normal. Lungs: Normal respiratory effort; chest expands symmetrically. Lungs are clear to auscultation without rales, wheezes, or increased work of breathing. Heart: Normal rate and rhythm. Normal S1 and S2. No gallop, click, or rub. S4 w/o murmur. Abdomen: Bowel sounds normal; abdomen soft and nontender. No masses, organomegaly or hernias noted. Genitalia: Genitalia normal  except for left varices. Prostate is asymmetrically enlarged , R > L w/o nodularity or induration.                              Musculoskeletal/extremities: Accentuated curvature of mid thoracic  Spine. Crepitus L knee > R. Chronic toenail changes. Able to lie down & sit up w/o help. Negative SLR bilaterally Vascular: Carotid, radial artery, dorsalis pedis and  posterior tibial pulses are full and equal. No bruits present. Varicose veins BLE Neurologic: Alert and oriented x3. Deep tendon reflexes symmetrical and normal.         Skin: Intact without suspicious lesions or rashes. Lymph: No cervical, axillary, or inguinal lymphadenopathy present. Psych: Mood and affect are normal. Normally interactive                                                                                        Assessment & Plan:  See Current Assessment & Plan in Problem List under specific Diagnosis  New issue of urinary frequency and urgency in the context of asymmetric prostatic enlargement. See orders and recommendations

## 2013-01-05 NOTE — Assessment & Plan Note (Signed)
BMET BP goals discussed 

## 2013-01-05 NOTE — Assessment & Plan Note (Signed)
CBC & dif 

## 2013-01-06 ENCOUNTER — Encounter: Payer: Self-pay | Admitting: Internal Medicine

## 2013-01-06 LAB — POCT URINALYSIS DIPSTICK
Bilirubin, UA: NEGATIVE
Blood, UA: NEGATIVE
Ketones, UA: NEGATIVE
Nitrite, UA: NEGATIVE
Protein, UA: NEGATIVE
pH, UA: 6

## 2013-01-17 ENCOUNTER — Other Ambulatory Visit: Payer: Self-pay | Admitting: *Deleted

## 2013-01-17 MED ORDER — WARFARIN SODIUM 5 MG PO TABS: ORAL_TABLET | ORAL | Status: DC

## 2013-01-17 NOTE — Telephone Encounter (Signed)
Rx sent 

## 2013-01-29 ENCOUNTER — Other Ambulatory Visit: Payer: Self-pay | Admitting: Internal Medicine

## 2013-01-29 NOTE — Telephone Encounter (Signed)
Refill done.  

## 2013-01-30 ENCOUNTER — Other Ambulatory Visit: Payer: Self-pay | Admitting: Internal Medicine

## 2013-02-07 ENCOUNTER — Ambulatory Visit (INDEPENDENT_AMBULATORY_CARE_PROVIDER_SITE_OTHER): Payer: Medicare Other | Admitting: *Deleted

## 2013-02-07 VITALS — BP 118/68 | HR 64 | Temp 98.4°F | Wt 219.0 lb

## 2013-02-07 DIAGNOSIS — Z7901 Long term (current) use of anticoagulants: Secondary | ICD-10-CM

## 2013-02-07 DIAGNOSIS — Z86718 Personal history of other venous thrombosis and embolism: Secondary | ICD-10-CM

## 2013-02-07 LAB — POCT INR: INR: 1.8

## 2013-02-07 NOTE — Patient Instructions (Signed)
Take 5mg  today and tomorrow, recheck INR in 3 week. Continue with 5mg  Tues and Thurs and 2.5 all other days after changes to today's and tomorrow's dose.

## 2013-02-28 ENCOUNTER — Ambulatory Visit (INDEPENDENT_AMBULATORY_CARE_PROVIDER_SITE_OTHER): Payer: Medicare Other | Admitting: *Deleted

## 2013-02-28 VITALS — BP 110/70 | HR 72 | Temp 98.2°F | Wt 215.0 lb

## 2013-02-28 DIAGNOSIS — Z7901 Long term (current) use of anticoagulants: Secondary | ICD-10-CM

## 2013-02-28 DIAGNOSIS — Z86718 Personal history of other venous thrombosis and embolism: Secondary | ICD-10-CM

## 2013-02-28 LAB — POCT INR: INR: 1.6

## 2013-02-28 NOTE — Patient Instructions (Addendum)
Take 5mg  on MOn, Wed Fri and Sun and 2.5mg  on Tues, Thurs and Sat. Recheck INR in 3 weeks

## 2013-03-07 ENCOUNTER — Other Ambulatory Visit: Payer: Self-pay | Admitting: Internal Medicine

## 2013-03-16 ENCOUNTER — Other Ambulatory Visit: Payer: Self-pay | Admitting: *Deleted

## 2013-03-16 ENCOUNTER — Other Ambulatory Visit: Payer: Self-pay | Admitting: Internal Medicine

## 2013-03-16 MED ORDER — LOSARTAN POTASSIUM-HCTZ 100-12.5 MG PO TABS: ORAL_TABLET | ORAL | Status: DC

## 2013-03-16 NOTE — Telephone Encounter (Signed)
Pharmacy requested to change Losartan-hctz to 90 day supply.  I have change prescription to 90 ct 1 R

## 2013-03-17 ENCOUNTER — Telehealth: Payer: Self-pay | Admitting: *Deleted

## 2013-03-21 ENCOUNTER — Ambulatory Visit (INDEPENDENT_AMBULATORY_CARE_PROVIDER_SITE_OTHER): Payer: Medicare Other

## 2013-03-21 VITALS — BP 122/84 | HR 68 | Temp 97.8°F | Wt 218.4 lb

## 2013-03-21 DIAGNOSIS — D689 Coagulation defect, unspecified: Secondary | ICD-10-CM

## 2013-03-21 DIAGNOSIS — D696 Thrombocytopenia, unspecified: Secondary | ICD-10-CM

## 2013-03-21 LAB — POCT INR: INR: 2.5

## 2013-03-21 NOTE — Patient Instructions (Signed)
Take 2.5mg  Monday 03/21/2013 and resume regular schedule Tues 03/22/2013 Su, M, Wed 5mg  T, Th, Fri, Sat 2.5mg   Recheck in one month, unless increased bruising.

## 2013-03-22 NOTE — Telephone Encounter (Signed)
error 

## 2013-03-23 ENCOUNTER — Other Ambulatory Visit: Payer: Self-pay

## 2013-04-15 ENCOUNTER — Other Ambulatory Visit: Payer: Self-pay | Admitting: Internal Medicine

## 2013-04-15 NOTE — Telephone Encounter (Signed)
Rx sent to the pharmacy by e-script.//AB/CMA 

## 2013-04-21 ENCOUNTER — Ambulatory Visit (INDEPENDENT_AMBULATORY_CARE_PROVIDER_SITE_OTHER): Payer: Medicare Other

## 2013-04-21 VITALS — BP 122/88 | HR 75 | Temp 98.1°F | Wt 216.4 lb

## 2013-04-21 DIAGNOSIS — D689 Coagulation defect, unspecified: Secondary | ICD-10-CM

## 2013-04-21 DIAGNOSIS — D696 Thrombocytopenia, unspecified: Secondary | ICD-10-CM

## 2013-04-21 LAB — POCT INR: INR: 3.3

## 2013-04-21 NOTE — Patient Instructions (Signed)
Continue with same dosing unless otherwise indicated per provider. 5mg  MWF and 2.5mg  all other days.  Have a great day!!

## 2013-05-20 ENCOUNTER — Ambulatory Visit (INDEPENDENT_AMBULATORY_CARE_PROVIDER_SITE_OTHER): Payer: Medicare Other | Admitting: *Deleted

## 2013-05-20 VITALS — BP 126/70 | HR 65 | Temp 97.7°F | Wt 217.0 lb

## 2013-05-20 DIAGNOSIS — Z23 Encounter for immunization: Secondary | ICD-10-CM

## 2013-05-20 DIAGNOSIS — Z7901 Long term (current) use of anticoagulants: Secondary | ICD-10-CM

## 2013-05-20 DIAGNOSIS — Z86718 Personal history of other venous thrombosis and embolism: Secondary | ICD-10-CM

## 2013-05-20 LAB — POCT INR: INR: 2.7

## 2013-05-20 NOTE — Patient Instructions (Signed)
Continue with same dosing Su, Tues, Wed 5mg  Wed, Th, Fri, Sat 2.5mg  Recheck in 4 weeks

## 2013-05-22 IMAGING — RF DG CHOLANGIOGRAM OPERATIVE
1 series · 4 of 4 positions shown · non-contrast
Comparison: Ultrasound 03/24/2011

CLINICAL DATA: Cholecystectomy

INTRAOPERATIVE CHOLANGIOGRAM
TECHNIQUE: Cholangiographic images from the C-arm fluoroscopic
device were submitted for interpretation post-operatively.  Please
see the procedural report for the amount of contrast and the
fluoroscopy time utilized.

[Series 1: run · 4 of 33 frames shown]
[frame 5/33]
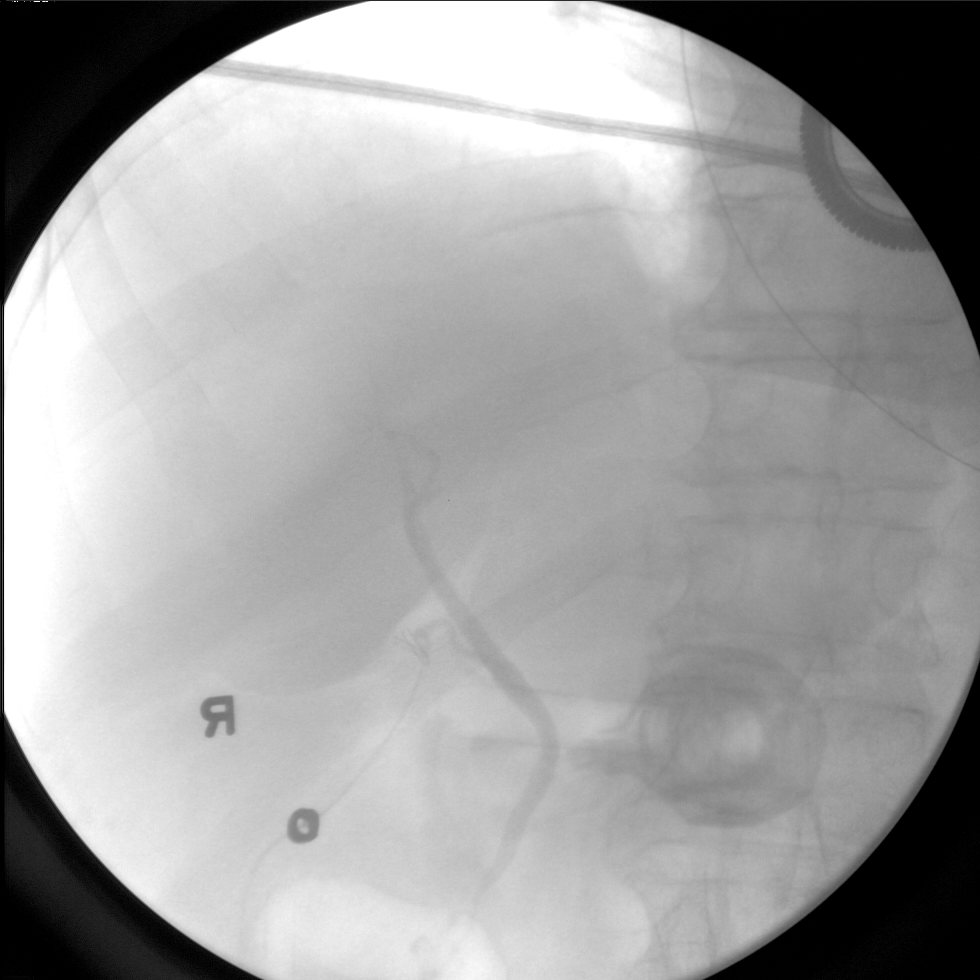
[frame 17/33]
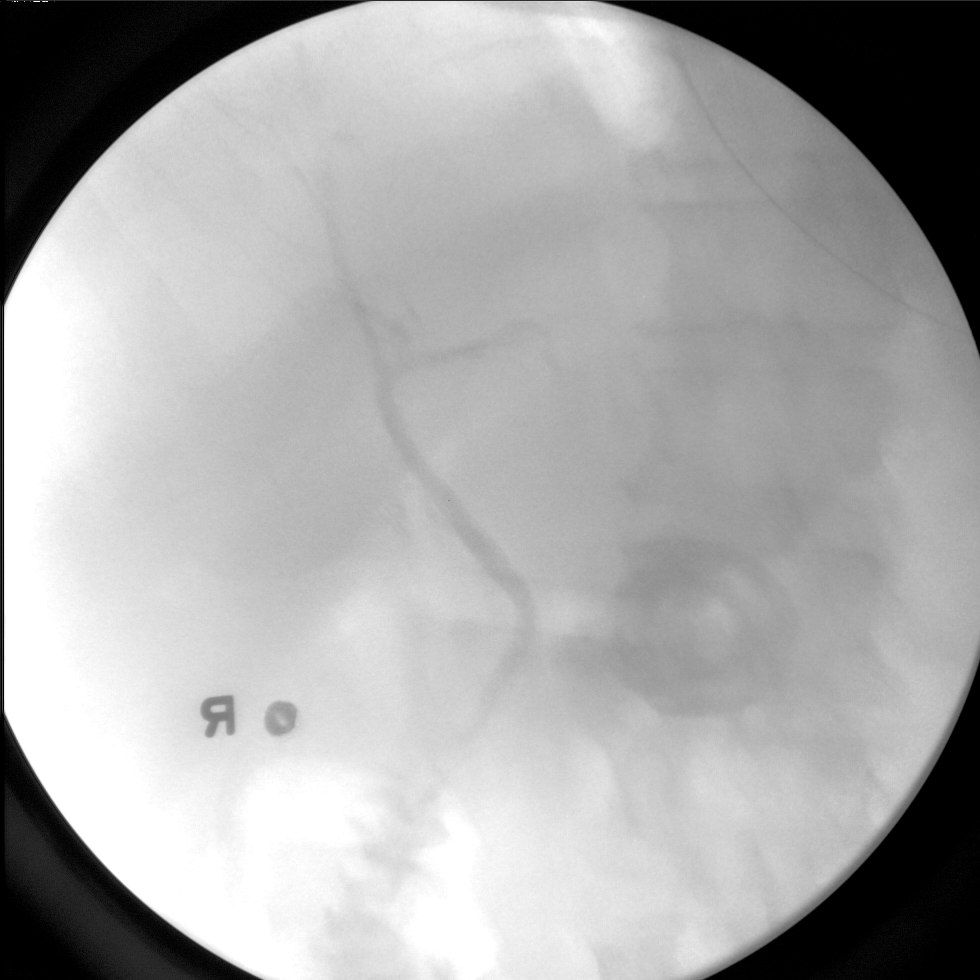
[frame 29/33]
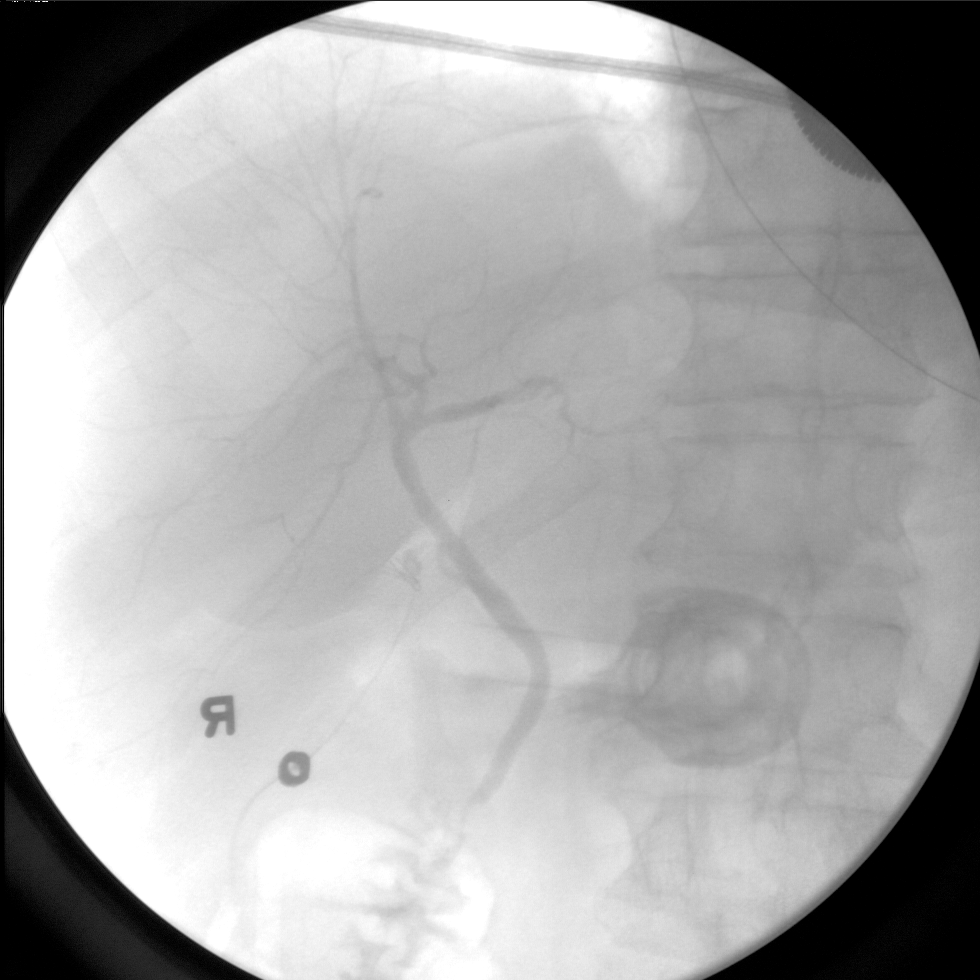
[frame 33/33]
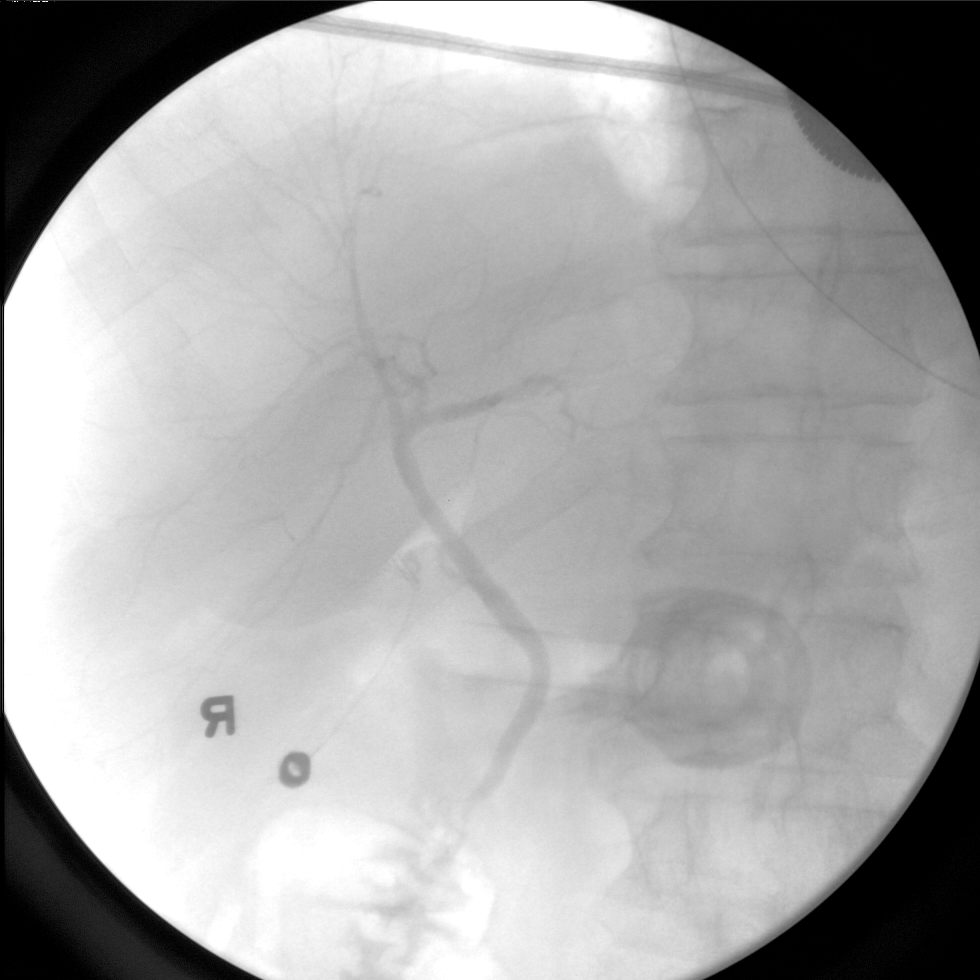

[4 of 4 positions shown; findings below may reference images not displayed]

FINDINGS: Cannulization of the cystic duct with opacification of
the biliary system.  There is filling of intrahepatic ducts and the
common bile duct.  Contrast empties into the duodenum.  No evidence
for filling defects or stones.  The biliary system is not dilated.
IMPRESSION: Normal intraoperative cholangiogram.  No evidence for biliary
obstruction or dilatation.

## 2013-05-28 ENCOUNTER — Other Ambulatory Visit: Payer: Self-pay | Admitting: Internal Medicine

## 2013-05-30 ENCOUNTER — Other Ambulatory Visit: Payer: Self-pay | Admitting: *Deleted

## 2013-05-30 MED ORDER — HYOSCYAMINE SULFATE 0.125 MG SL SUBL: SUBLINGUAL_TABLET | SUBLINGUAL | Status: DC

## 2013-05-30 MED ORDER — GABAPENTIN 300 MG PO CAPS: ORAL_CAPSULE | ORAL | Status: DC

## 2013-05-30 NOTE — Telephone Encounter (Signed)
Gabapentin and Levsin refills sent to pharmacy

## 2013-06-01 ENCOUNTER — Other Ambulatory Visit: Payer: Self-pay | Admitting: Internal Medicine

## 2013-06-01 NOTE — Telephone Encounter (Signed)
Coumadin refill sent to pharmacy 

## 2013-06-08 ENCOUNTER — Other Ambulatory Visit: Payer: Self-pay | Admitting: Internal Medicine

## 2013-06-10 NOTE — Telephone Encounter (Signed)
Med filled.  

## 2013-06-23 ENCOUNTER — Ambulatory Visit: Payer: Self-pay | Admitting: Internal Medicine

## 2013-06-23 ENCOUNTER — Ambulatory Visit (INDEPENDENT_AMBULATORY_CARE_PROVIDER_SITE_OTHER): Payer: Medicare Other | Admitting: *Deleted

## 2013-06-23 VITALS — BP 124/76 | HR 66 | Temp 98.8°F | Wt 217.0 lb

## 2013-06-23 DIAGNOSIS — Z7901 Long term (current) use of anticoagulants: Secondary | ICD-10-CM

## 2013-06-23 DIAGNOSIS — D6851 Activated protein C resistance: Secondary | ICD-10-CM

## 2013-06-23 DIAGNOSIS — D6859 Other primary thrombophilia: Secondary | ICD-10-CM

## 2013-06-23 DIAGNOSIS — Z86718 Personal history of other venous thrombosis and embolism: Secondary | ICD-10-CM

## 2013-06-23 NOTE — Patient Instructions (Signed)
Hold today's dose. Take 2.5 mg on Mon, Wed, Fri and Sun Take 5mg  on Tues, Thur and Sat Recheck in 2 weeks

## 2013-06-24 ENCOUNTER — Encounter: Payer: Self-pay | Admitting: Internal Medicine

## 2013-07-07 ENCOUNTER — Ambulatory Visit: Payer: Medicare Other

## 2013-07-11 ENCOUNTER — Ambulatory Visit (HOSPITAL_BASED_OUTPATIENT_CLINIC_OR_DEPARTMENT_OTHER): Payer: Medicare Other | Admitting: Oncology

## 2013-07-11 ENCOUNTER — Other Ambulatory Visit (HOSPITAL_BASED_OUTPATIENT_CLINIC_OR_DEPARTMENT_OTHER): Payer: Medicare Other | Admitting: Lab

## 2013-07-11 VITALS — BP 125/79 | HR 70 | Temp 97.0°F | Resp 20 | Ht 71.0 in | Wt 216.4 lb

## 2013-07-11 DIAGNOSIS — D696 Thrombocytopenia, unspecified: Secondary | ICD-10-CM

## 2013-07-11 DIAGNOSIS — D6859 Other primary thrombophilia: Secondary | ICD-10-CM

## 2013-07-11 DIAGNOSIS — D6851 Activated protein C resistance: Secondary | ICD-10-CM

## 2013-07-11 DIAGNOSIS — D689 Coagulation defect, unspecified: Secondary | ICD-10-CM

## 2013-07-11 DIAGNOSIS — D682 Hereditary deficiency of other clotting factors: Secondary | ICD-10-CM

## 2013-07-11 DIAGNOSIS — Z7901 Long term (current) use of anticoagulants: Secondary | ICD-10-CM

## 2013-07-11 DIAGNOSIS — Z86718 Personal history of other venous thrombosis and embolism: Secondary | ICD-10-CM

## 2013-07-11 LAB — CBC WITH DIFFERENTIAL/PLATELET
BASO%: 0.9 % (ref 0.0–2.0)
EOS%: 2 % (ref 0.0–7.0)
Eosinophils Absolute: 0.1 10*3/uL (ref 0.0–0.5)
HCT: 44 % (ref 38.4–49.9)
LYMPH%: 37.1 % (ref 14.0–49.0)
MCHC: 34.1 g/dL (ref 32.0–36.0)
MONO#: 0.6 10*3/uL (ref 0.1–0.9)
NEUT#: 3.6 10*3/uL (ref 1.5–6.5)
NEUT%: 51.4 % (ref 39.0–75.0)
Platelets: 138 10*3/uL — ABNORMAL LOW (ref 140–400)
RBC: 4.59 10*6/uL (ref 4.20–5.82)
RDW: 13.4 % (ref 11.0–14.6)
WBC: 7 10*3/uL (ref 4.0–10.3)
lymph#: 2.6 10*3/uL (ref 0.9–3.3)

## 2013-07-12 ENCOUNTER — Encounter: Payer: Self-pay | Admitting: Oncology

## 2013-07-12 DIAGNOSIS — K55069 Acute infarction of intestine, part and extent unspecified: Secondary | ICD-10-CM

## 2013-07-12 HISTORY — DX: Acute infarction of intestine, part and extent unspecified: K55.069

## 2013-07-12 LAB — PROTIME-INR
INR: 1.9 — ABNORMAL LOW (ref 2.00–3.50)
Protime: 22.8 Seconds — ABNORMAL HIGH (ref 10.6–13.4)

## 2013-07-12 NOTE — Progress Notes (Signed)
Hematology and Oncology Follow Up Visit  Patrick Hatfield [id number] November 27, 1934 78 y.o. 07/12/2013 7:33 PM   Principle Diagnosis: Encounter Diagnoses  Name Primary?  . COAGULOPATHY Yes  . Factor 5 Leiden mutation, heterozygous   . Thrombocytopenia, unspecified      Interim History:   Followup visit for this 78 year old man I initially evaluated in this office in August 2010 subsequent to a hospital consultation to evaluate recurrent thrombosis and mild thrombocytopenia. He presented with acute superior mesenteric vein thrombosis at that time with a previous history of a lower extremity DVT. He was found to be a heterozygote for the Factor V Leiden gene mutation. He has mild thrombocytopenia never less than 100,000, likely due to chronic alcohol use.  I recommended long-term anticoagulation. He has done well and has had no subsequent thrombotic events. He has had no interim major medical problems.   Medications: reviewed  Allergies:  Allergies  Allergen Reactions  . Amlodipine Besy-Benazepril Hcl     REACTION: swelling of feet.  Probably  from Amlodipine component)  . Lipitor [Atorvastatin Calcium]     ? Reaction, ? swelling    Review of Systems: Hematology: No bleeding or bruising ENT ROS: No sore throat Breast ROS:  Respiratory ROS: No cough or dyspnea Cardiovascular ROS:  No chest pain or palpitations Gastrointestinal ROS:  No abdominal pain. No hematochezia or melena Genito-Urinary ROS no urinary tract symptoms Musculoskeletal ROS no muscle or bone pain Neurological ROS: No headache or change in vision Dermatological ROS: No rash or ecchymosis Remaining ROS negative  Physical Exam: Blood pressure 125/79, pulse 70, temperature 97 F (36.1 C), temperature source Oral, resp. rate 20, height 5\' 11"  (1.803 m), weight 216 lb 6.4 oz (98.158 kg). Wt Readings from Last 3 Encounters:  07/11/13 216 lb 6.4 oz (98.158 kg)  06/23/13 217 lb (98.431 kg)  05/20/13 217 lb (98.431  kg)     General appearance: Well-nourished Caucasian man HENNT: Pharynx no erythema, exudate, mass, or ulcer. No thyromegaly or thyroid nodules Lymph nodes: No cervical, supraclavicular, or axillary lymphadenopathy Breasts:  Lungs: Clear to auscultation, resonant to percussion throughout Heart: Regular rhythm, no murmur, no gallop, no rub, no click, no edema Abdomen: Soft, nontender, normal bowel sounds, no mass, no organomegaly Extremities: No edema, no calf tenderness Musculoskeletal: no joint deformities GU:  Vascular: Carotid pulses 2+, no bruits, distal pulses: Dorsalis pedis 1+ symmetric Neurologic: Alert, oriented, PERRLA, optic discs sharp and vessels normal, no hemorrhage or exudate, cranial nerves grossly normal, motor strength 5 over 5, reflexes 1+ symmetric, upper body coordination normal, gait normal, Skin: No rash or ecchymosis  Lab Results: CBC W/Diff    Component Value Date/Time   WBC 7.0 07/11/2013 1504   WBC 5.9 01/05/2013 1018   RBC 4.59 07/11/2013 1504   RBC 4.77 01/05/2013 1018   HGB 15.0 07/11/2013 1504   HGB 16.0 01/05/2013 1018   HCT 44.0 07/11/2013 1504   HCT 46.2 01/05/2013 1018   PLT 138* 07/11/2013 1504   PLT 126.0* 01/05/2013 1018   MCV 95.9 07/11/2013 1504   MCV 96.9 01/05/2013 1018   MCH 32.7 07/11/2013 1504   MCH 32.8 05/06/2011 1125   MCHC 34.1 07/11/2013 1504   MCHC 34.6 01/05/2013 1018   RDW 13.4 07/11/2013 1504   RDW 13.9 01/05/2013 1018   LYMPHSABS 2.6 07/11/2013 1504   LYMPHSABS 2.5 01/05/2013 1018   MONOABS 0.6 07/11/2013 1504   MONOABS 0.6 01/05/2013 1018   EOSABS 0.1 07/11/2013 1504   EOSABS  0.2 01/05/2013 1018   BASOSABS 0.1 07/11/2013 1504   BASOSABS 0.1 01/05/2013 1018     Chemistry      Component Value Date/Time   NA 138 01/05/2013 1018   K 3.6 01/05/2013 1018   CL 101 01/05/2013 1018   CO2 27 01/05/2013 1018   BUN 16 01/05/2013 1018   CREATININE 0.9 01/05/2013 1018      Component Value Date/Time   CALCIUM 9.8 01/05/2013 1018    ALKPHOS 40 01/05/2013 1018   AST 58* 01/05/2013 1018   ALT 73* 01/05/2013 1018   BILITOT 2.5* 01/05/2013 1018    INR 1.9   Impression: #1. Coagulopathy related to factor V Leiden gene mutation heterozygote status  #2. Metachronous thrombotic events: Initial lower extremity DVT and subsequent mesenteric vascular thrombosis August 2010 He will continue on chronic Coumadin anticoagulation. 5 mg Tuesday Thursday Saturday, 2.5 mg other days of the week  #3. Mild chronic stable thrombocytopenia Alcohol related?  #4. Fluctuating mild transaminase elevation     CC: Patient Care Team: Pecola Lawless, MD as PCP - General   Levert Feinstein, MD 11/25/20147:33 PM

## 2013-08-02 ENCOUNTER — Other Ambulatory Visit: Payer: Self-pay | Admitting: Internal Medicine

## 2013-08-02 NOTE — Telephone Encounter (Signed)
Med filled.  

## 2013-08-24 ENCOUNTER — Telehealth: Payer: Self-pay | Admitting: *Deleted

## 2013-08-24 ENCOUNTER — Ambulatory Visit: Payer: Medicare Other

## 2013-08-24 ENCOUNTER — Ambulatory Visit: Payer: Self-pay | Admitting: Internal Medicine

## 2013-08-24 ENCOUNTER — Ambulatory Visit (INDEPENDENT_AMBULATORY_CARE_PROVIDER_SITE_OTHER): Payer: Medicare Other | Admitting: *Deleted

## 2013-08-24 VITALS — BP 124/82 | HR 65 | Temp 98.1°F | Wt 216.2 lb

## 2013-08-24 DIAGNOSIS — Z7901 Long term (current) use of anticoagulants: Secondary | ICD-10-CM

## 2013-08-24 DIAGNOSIS — Z86718 Personal history of other venous thrombosis and embolism: Secondary | ICD-10-CM

## 2013-08-24 LAB — POCT INR: INR: 2.3

## 2013-08-24 NOTE — Telephone Encounter (Signed)
Error

## 2013-09-21 ENCOUNTER — Ambulatory Visit (INDEPENDENT_AMBULATORY_CARE_PROVIDER_SITE_OTHER): Payer: Medicare Other | Admitting: *Deleted

## 2013-09-21 VITALS — BP 122/72 | HR 63 | Temp 98.4°F | Wt 213.0 lb

## 2013-09-21 DIAGNOSIS — D6851 Activated protein C resistance: Secondary | ICD-10-CM

## 2013-09-21 DIAGNOSIS — Z7901 Long term (current) use of anticoagulants: Secondary | ICD-10-CM

## 2013-09-21 DIAGNOSIS — D6859 Other primary thrombophilia: Secondary | ICD-10-CM

## 2013-09-21 LAB — POCT INR
INR: 2.7
INR: 2.8

## 2013-09-21 NOTE — Patient Instructions (Signed)
Take 2.5 mg on Mon, Wed, Fri and Sun Take 5mg  on Tues, Thur and Sat Recheck in 4 weeks

## 2013-10-19 ENCOUNTER — Ambulatory Visit (INDEPENDENT_AMBULATORY_CARE_PROVIDER_SITE_OTHER): Payer: Medicare Other

## 2013-10-19 ENCOUNTER — Other Ambulatory Visit: Payer: Medicare Other

## 2013-10-19 ENCOUNTER — Ambulatory Visit (INDEPENDENT_AMBULATORY_CARE_PROVIDER_SITE_OTHER): Payer: Medicare Other | Admitting: Family Medicine

## 2013-10-19 DIAGNOSIS — Z86718 Personal history of other venous thrombosis and embolism: Secondary | ICD-10-CM

## 2013-10-19 DIAGNOSIS — Z7901 Long term (current) use of anticoagulants: Secondary | ICD-10-CM

## 2013-10-19 LAB — POCT INR: INR: 3.3

## 2013-11-08 ENCOUNTER — Other Ambulatory Visit: Payer: Self-pay | Admitting: Internal Medicine

## 2013-11-09 ENCOUNTER — Other Ambulatory Visit: Payer: Self-pay | Admitting: *Deleted

## 2013-11-09 ENCOUNTER — Encounter: Payer: Self-pay | Admitting: Internal Medicine

## 2013-11-09 DIAGNOSIS — Z86718 Personal history of other venous thrombosis and embolism: Secondary | ICD-10-CM

## 2013-11-10 ENCOUNTER — Other Ambulatory Visit: Payer: Self-pay | Admitting: General Practice

## 2013-11-10 MED ORDER — WARFARIN SODIUM 5 MG PO TABS: ORAL_TABLET | ORAL | Status: DC

## 2013-11-16 ENCOUNTER — Ambulatory Visit (INDEPENDENT_AMBULATORY_CARE_PROVIDER_SITE_OTHER): Payer: Medicare Other | Admitting: General Practice

## 2013-11-16 DIAGNOSIS — D6859 Other primary thrombophilia: Secondary | ICD-10-CM

## 2013-11-16 DIAGNOSIS — D6851 Activated protein C resistance: Secondary | ICD-10-CM

## 2013-11-16 DIAGNOSIS — I81 Portal vein thrombosis: Secondary | ICD-10-CM

## 2013-11-16 DIAGNOSIS — Z86718 Personal history of other venous thrombosis and embolism: Secondary | ICD-10-CM

## 2013-11-16 DIAGNOSIS — Z5181 Encounter for therapeutic drug level monitoring: Secondary | ICD-10-CM

## 2013-11-16 DIAGNOSIS — K55069 Acute infarction of intestine, part and extent unspecified: Secondary | ICD-10-CM

## 2013-11-16 DIAGNOSIS — K55059 Acute (reversible) ischemia of intestine, part and extent unspecified: Secondary | ICD-10-CM

## 2013-11-16 LAB — POCT INR: INR: 2.5

## 2013-11-16 NOTE — Progress Notes (Signed)
Pre visit review using our clinic review tool, if applicable. No additional management support is needed unless otherwise documented below in the visit note. 

## 2013-12-08 ENCOUNTER — Other Ambulatory Visit: Payer: Self-pay

## 2013-12-08 MED ORDER — PROPRANOLOL HCL 40 MG PO TABS: ORAL_TABLET | ORAL | Status: DC

## 2013-12-14 ENCOUNTER — Other Ambulatory Visit: Payer: Self-pay | Admitting: General Practice

## 2013-12-14 ENCOUNTER — Ambulatory Visit (INDEPENDENT_AMBULATORY_CARE_PROVIDER_SITE_OTHER): Payer: Medicare Other | Admitting: General Practice

## 2013-12-14 DIAGNOSIS — D6859 Other primary thrombophilia: Secondary | ICD-10-CM

## 2013-12-14 DIAGNOSIS — I81 Portal vein thrombosis: Secondary | ICD-10-CM

## 2013-12-14 DIAGNOSIS — D6851 Activated protein C resistance: Secondary | ICD-10-CM

## 2013-12-14 DIAGNOSIS — K55069 Acute infarction of intestine, part and extent unspecified: Secondary | ICD-10-CM

## 2013-12-14 DIAGNOSIS — K55059 Acute (reversible) ischemia of intestine, part and extent unspecified: Secondary | ICD-10-CM

## 2013-12-14 DIAGNOSIS — Z86718 Personal history of other venous thrombosis and embolism: Secondary | ICD-10-CM

## 2013-12-14 DIAGNOSIS — Z5181 Encounter for therapeutic drug level monitoring: Secondary | ICD-10-CM

## 2013-12-14 LAB — POCT INR: INR: 2.9

## 2013-12-14 MED ORDER — WARFARIN SODIUM 5 MG PO TABS: ORAL_TABLET | ORAL | Status: DC

## 2013-12-14 NOTE — Progress Notes (Signed)
Pre visit review using our clinic review tool, if applicable. No additional management support is needed unless otherwise documented below in the visit note. 

## 2014-01-03 ENCOUNTER — Other Ambulatory Visit: Payer: Self-pay

## 2014-01-03 ENCOUNTER — Other Ambulatory Visit: Payer: Self-pay | Admitting: Internal Medicine

## 2014-01-03 MED ORDER — LOSARTAN POTASSIUM-HCTZ 100-12.5 MG PO TABS
ORAL_TABLET | ORAL | Status: DC
Start: 2014-01-03 — End: 2014-02-02

## 2014-01-11 ENCOUNTER — Ambulatory Visit (INDEPENDENT_AMBULATORY_CARE_PROVIDER_SITE_OTHER): Payer: Medicare Other | Admitting: Family Medicine

## 2014-01-11 DIAGNOSIS — D6859 Other primary thrombophilia: Secondary | ICD-10-CM

## 2014-01-11 DIAGNOSIS — Z5181 Encounter for therapeutic drug level monitoring: Secondary | ICD-10-CM

## 2014-01-11 DIAGNOSIS — I81 Portal vein thrombosis: Secondary | ICD-10-CM

## 2014-01-11 DIAGNOSIS — K55069 Acute infarction of intestine, part and extent unspecified: Secondary | ICD-10-CM

## 2014-01-11 DIAGNOSIS — Z86718 Personal history of other venous thrombosis and embolism: Secondary | ICD-10-CM

## 2014-01-11 DIAGNOSIS — K55059 Acute (reversible) ischemia of intestine, part and extent unspecified: Secondary | ICD-10-CM

## 2014-01-11 DIAGNOSIS — D6851 Activated protein C resistance: Secondary | ICD-10-CM

## 2014-01-11 LAB — POCT INR: INR: 2.6

## 2014-01-12 ENCOUNTER — Encounter: Payer: Self-pay | Admitting: Internal Medicine

## 2014-01-31 ENCOUNTER — Encounter: Payer: Self-pay | Admitting: Internal Medicine

## 2014-01-31 ENCOUNTER — Ambulatory Visit (INDEPENDENT_AMBULATORY_CARE_PROVIDER_SITE_OTHER): Payer: Medicare Other | Admitting: Internal Medicine

## 2014-01-31 ENCOUNTER — Other Ambulatory Visit: Payer: Medicare Other

## 2014-01-31 VITALS — BP 110/70 | HR 80 | Temp 98.7°F | Ht 70.25 in | Wt 215.6 lb

## 2014-01-31 DIAGNOSIS — R3915 Urgency of urination: Secondary | ICD-10-CM

## 2014-01-31 DIAGNOSIS — R82998 Other abnormal findings in urine: Secondary | ICD-10-CM

## 2014-01-31 DIAGNOSIS — R7309 Other abnormal glucose: Secondary | ICD-10-CM

## 2014-01-31 DIAGNOSIS — R8290 Unspecified abnormal findings in urine: Secondary | ICD-10-CM

## 2014-01-31 DIAGNOSIS — K219 Gastro-esophageal reflux disease without esophagitis: Secondary | ICD-10-CM

## 2014-01-31 DIAGNOSIS — Z0000 Encounter for general adult medical examination without abnormal findings: Secondary | ICD-10-CM

## 2014-01-31 DIAGNOSIS — E785 Hyperlipidemia, unspecified: Secondary | ICD-10-CM

## 2014-01-31 DIAGNOSIS — D696 Thrombocytopenia, unspecified: Secondary | ICD-10-CM

## 2014-01-31 DIAGNOSIS — I10 Essential (primary) hypertension: Secondary | ICD-10-CM

## 2014-01-31 LAB — POCT URINALYSIS DIPSTICK
Bilirubin, UA: NEGATIVE
Glucose, UA: NEGATIVE
KETONES UA: NEGATIVE
Leukocytes, UA: NEGATIVE
Nitrite, UA: NEGATIVE
PH UA: 6
SPEC GRAV UA: 1.025
Urobilinogen, UA: 0.2

## 2014-01-31 NOTE — Assessment & Plan Note (Signed)
Lipids, LFTs, TSH  

## 2014-01-31 NOTE — Progress Notes (Signed)
   Subjective:    Patient ID: Patrick Hatfield, male    DOB: 09-25-1934, 79 y.o.   MRN: [id number]  HPI  HPI He is here to assess active health issues & conditions.  PMH, FH, & Social history verified & updated   Diet: Low fat diet. The pt does report a diet high in Na+  Exercise program: Pt reports very little formal exercise   HYPERTENSION:  Pt is not monitoring BP at home as he was having it checked every month at his coumadin clinic and states he was not having any problems with it being high. He does have access to a cuff at home. Pt is compliant with HTN medications and notes no adverse side effects from medications.   HYPERLIPIDEMIA:  Pt was previously on lipitor but had peripheral edema and stopped medication. He is currently working to control his hyperlipidemia through diet alone   Review of Systems  Constitutional: Negative for fatigue.  Eyes: Negative for visual disturbance.  Respiratory: Negative for shortness of breath.   Cardiovascular: Negative for chest pain, palpitations and leg swelling.  Gastrointestinal:       Change in bowel pattern since gallbladder removal  Endocrine: Positive for polyuria. Negative for cold intolerance, heat intolerance and polydipsia.  Genitourinary: Positive for urgency. Negative for dysuria, frequency and hematuria.  Skin: Negative for color change.       No change in skin hair or nails  Neurological: Negative for light-headedness, numbness and headaches.       Objective:   Physical Exam        Assessment & Plan:

## 2014-01-31 NOTE — Assessment & Plan Note (Signed)
CBC & dif 

## 2014-01-31 NOTE — Patient Instructions (Signed)
Your next office appointment will be determined based upon review of your pending labs 02/07/14. Those instructions will be transmitted to you through My Chart. Reflux of gastric acid may be asymptomatic as this may occur mainly during sleep.The triggers for reflux  include stress; the "aspirin family" ; alcohol; peppermint; and caffeine (coffee, tea, cola, and chocolate). The aspirin family would include aspirin and the nonsteroidal agents such as ibuprofen &  Naproxen. Tylenol would not cause reflux. If having symptoms ; food & drink should be avoided for @ least 2 hours before going to bed.  To prevent sleep dysfunction follow these instructions for sleep hygiene. Do not read, watch TV, or eat in bed. Do not get into bed until you are ready to turn off the light &  to go to sleep. Do not ingest stimulants ( decongestants, diet pills, nicotine, caffeine) after the evening meal.Do not take daytime naps.Cardiovascular exercise, this can be as simple a program as walking, is recommended 30-45 minutes 3-4 times per week. If you're not exercising you should take 6-8 weeks to build up to this level.

## 2014-01-31 NOTE — Assessment & Plan Note (Signed)
Blood pressure goals reviewed. BMET 

## 2014-01-31 NOTE — Progress Notes (Signed)
Pre visit review using our clinic review tool, if applicable. No additional management support is needed unless otherwise documented below in the visit note. 
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
## 2014-01-31 NOTE — Assessment & Plan Note (Signed)
A1c

## 2014-02-01 ENCOUNTER — Telehealth: Payer: Self-pay | Admitting: Internal Medicine

## 2014-02-01 NOTE — Telephone Encounter (Signed)
Relevant patient education assigned to patient using Emmi. ° °

## 2014-02-02 ENCOUNTER — Other Ambulatory Visit: Payer: Self-pay | Admitting: Internal Medicine

## 2014-02-02 LAB — URINE CULTURE
Colony Count: NO GROWTH
Organism ID, Bacteria: NO GROWTH

## 2014-02-03 ENCOUNTER — Telehealth: Payer: Self-pay | Admitting: Internal Medicine

## 2014-02-03 ENCOUNTER — Encounter: Payer: Self-pay | Admitting: Internal Medicine

## 2014-02-03 ENCOUNTER — Telehealth: Payer: Self-pay

## 2014-02-03 DIAGNOSIS — R310 Gross hematuria: Secondary | ICD-10-CM | POA: Insufficient documentation

## 2014-02-03 DIAGNOSIS — R3129 Other microscopic hematuria: Secondary | ICD-10-CM

## 2014-02-03 NOTE — Telephone Encounter (Signed)
Request for add on lab has been faxed

## 2014-02-03 NOTE — Telephone Encounter (Signed)
Per Dr Linna Darner to add this lab  [place] lab to disregard add on fax as labs had not been done yet

## 2014-02-03 NOTE — Telephone Encounter (Signed)
Pt request PSA to be added to his blood work. 'I already have an appointment on June 24 for my regular PT check and for blood work as part of my annual physical. I would also like to request a PSA test be added to my blood work to be done on the 24th. Thank you" Please call pt if this is ok.

## 2014-02-03 NOTE — Telephone Encounter (Signed)
Add PSA if possible Dx: microscopic hematuria

## 2014-02-08 ENCOUNTER — Other Ambulatory Visit (INDEPENDENT_AMBULATORY_CARE_PROVIDER_SITE_OTHER): Payer: Medicare Other

## 2014-02-08 ENCOUNTER — Ambulatory Visit (INDEPENDENT_AMBULATORY_CARE_PROVIDER_SITE_OTHER): Payer: Medicare Other | Admitting: General Practice

## 2014-02-08 DIAGNOSIS — Z5181 Encounter for therapeutic drug level monitoring: Secondary | ICD-10-CM

## 2014-02-08 DIAGNOSIS — R7309 Other abnormal glucose: Secondary | ICD-10-CM

## 2014-02-08 DIAGNOSIS — E785 Hyperlipidemia, unspecified: Secondary | ICD-10-CM

## 2014-02-08 DIAGNOSIS — D696 Thrombocytopenia, unspecified: Secondary | ICD-10-CM

## 2014-02-08 DIAGNOSIS — K219 Gastro-esophageal reflux disease without esophagitis: Secondary | ICD-10-CM

## 2014-02-08 DIAGNOSIS — K55059 Acute (reversible) ischemia of intestine, part and extent unspecified: Secondary | ICD-10-CM

## 2014-02-08 DIAGNOSIS — R3129 Other microscopic hematuria: Secondary | ICD-10-CM

## 2014-02-08 DIAGNOSIS — K55069 Acute infarction of intestine, part and extent unspecified: Secondary | ICD-10-CM

## 2014-02-08 DIAGNOSIS — D6859 Other primary thrombophilia: Secondary | ICD-10-CM

## 2014-02-08 DIAGNOSIS — I81 Portal vein thrombosis: Secondary | ICD-10-CM

## 2014-02-08 DIAGNOSIS — D6851 Activated protein C resistance: Secondary | ICD-10-CM

## 2014-02-08 DIAGNOSIS — Z86718 Personal history of other venous thrombosis and embolism: Secondary | ICD-10-CM

## 2014-02-08 DIAGNOSIS — I10 Essential (primary) hypertension: Secondary | ICD-10-CM

## 2014-02-08 LAB — CBC WITH DIFFERENTIAL/PLATELET
Basophils Absolute: 0 10*3/uL (ref 0.0–0.1)
Basophils Relative: 0.8 % (ref 0.0–3.0)
EOS PCT: 1.8 % (ref 0.0–5.0)
Eosinophils Absolute: 0.1 10*3/uL (ref 0.0–0.7)
HEMATOCRIT: 42.6 % (ref 39.0–52.0)
HEMOGLOBIN: 14.7 g/dL (ref 13.0–17.0)
LYMPHS ABS: 2.5 10*3/uL (ref 0.7–4.0)
LYMPHS PCT: 48.2 % — AB (ref 12.0–46.0)
MCHC: 34.6 g/dL (ref 30.0–36.0)
MCV: 96.9 fl (ref 78.0–100.0)
MONOS PCT: 8.5 % (ref 3.0–12.0)
Monocytes Absolute: 0.4 10*3/uL (ref 0.1–1.0)
NEUTROS ABS: 2.1 10*3/uL (ref 1.4–7.7)
Neutrophils Relative %: 40.7 % — ABNORMAL LOW (ref 43.0–77.0)
Platelets: 126 10*3/uL — ABNORMAL LOW (ref 150.0–400.0)
RBC: 4.39 Mil/uL (ref 4.22–5.81)
RDW: 13.2 % (ref 11.5–15.5)
WBC: 5.2 10*3/uL (ref 4.0–10.5)

## 2014-02-08 LAB — PSA: PSA: 1.46 ng/mL (ref 0.10–4.00)

## 2014-02-08 LAB — BASIC METABOLIC PANEL
BUN: 19 mg/dL (ref 6–23)
CHLORIDE: 104 meq/L (ref 96–112)
CO2: 29 meq/L (ref 19–32)
Calcium: 9.5 mg/dL (ref 8.4–10.5)
Creatinine, Ser: 0.9 mg/dL (ref 0.4–1.5)
GFR: 88.02 mL/min (ref >60.00)
Glucose, Bld: 119 mg/dL — ABNORMAL HIGH (ref 70–99)
POTASSIUM: 3.8 meq/L (ref 3.5–5.1)
SODIUM: 139 meq/L (ref 135–145)

## 2014-02-08 LAB — POCT INR: INR: 2.1

## 2014-02-08 LAB — HEMOGLOBIN A1C: Hgb A1c MFr Bld: 5.2 % (ref 4.6–6.5)

## 2014-02-08 LAB — TSH: TSH: 1.63 u[IU]/mL (ref 0.35–4.50)

## 2014-02-08 NOTE — Progress Notes (Signed)
Pre visit review using our clinic review tool, if applicable. No additional management support is needed unless otherwise documented below in the visit note. 

## 2014-03-02 ENCOUNTER — Other Ambulatory Visit: Payer: Self-pay | Admitting: Internal Medicine

## 2014-03-22 ENCOUNTER — Ambulatory Visit (INDEPENDENT_AMBULATORY_CARE_PROVIDER_SITE_OTHER): Payer: Medicare Other | Admitting: Family Medicine

## 2014-03-22 DIAGNOSIS — D6851 Activated protein C resistance: Secondary | ICD-10-CM

## 2014-03-22 DIAGNOSIS — K55069 Acute infarction of intestine, part and extent unspecified: Secondary | ICD-10-CM

## 2014-03-22 DIAGNOSIS — Z86718 Personal history of other venous thrombosis and embolism: Secondary | ICD-10-CM

## 2014-03-22 DIAGNOSIS — I81 Portal vein thrombosis: Secondary | ICD-10-CM

## 2014-03-22 DIAGNOSIS — Z5181 Encounter for therapeutic drug level monitoring: Secondary | ICD-10-CM

## 2014-03-22 DIAGNOSIS — D6859 Other primary thrombophilia: Secondary | ICD-10-CM

## 2014-03-22 DIAGNOSIS — K55059 Acute (reversible) ischemia of intestine, part and extent unspecified: Secondary | ICD-10-CM

## 2014-03-22 LAB — POCT INR: INR: 2.7

## 2014-05-03 ENCOUNTER — Encounter: Payer: Self-pay | Admitting: Gastroenterology

## 2014-05-03 ENCOUNTER — Ambulatory Visit (INDEPENDENT_AMBULATORY_CARE_PROVIDER_SITE_OTHER): Payer: Medicare Other | Admitting: *Deleted

## 2014-05-03 DIAGNOSIS — D6859 Other primary thrombophilia: Secondary | ICD-10-CM

## 2014-05-03 DIAGNOSIS — D6851 Activated protein C resistance: Secondary | ICD-10-CM

## 2014-05-03 DIAGNOSIS — Z5181 Encounter for therapeutic drug level monitoring: Secondary | ICD-10-CM

## 2014-05-03 DIAGNOSIS — I81 Portal vein thrombosis: Secondary | ICD-10-CM

## 2014-05-03 DIAGNOSIS — Z23 Encounter for immunization: Secondary | ICD-10-CM

## 2014-05-03 DIAGNOSIS — K55069 Acute infarction of intestine, part and extent unspecified: Secondary | ICD-10-CM

## 2014-05-03 DIAGNOSIS — K55059 Acute (reversible) ischemia of intestine, part and extent unspecified: Secondary | ICD-10-CM

## 2014-05-03 DIAGNOSIS — Z86718 Personal history of other venous thrombosis and embolism: Secondary | ICD-10-CM

## 2014-05-03 LAB — POCT INR: INR: 2.5

## 2014-06-02 ENCOUNTER — Other Ambulatory Visit: Payer: Self-pay

## 2014-06-14 ENCOUNTER — Ambulatory Visit (INDEPENDENT_AMBULATORY_CARE_PROVIDER_SITE_OTHER): Payer: Medicare Other | Admitting: *Deleted

## 2014-06-14 DIAGNOSIS — Z86718 Personal history of other venous thrombosis and embolism: Secondary | ICD-10-CM

## 2014-06-14 DIAGNOSIS — I81 Portal vein thrombosis: Secondary | ICD-10-CM

## 2014-06-14 DIAGNOSIS — D6851 Activated protein C resistance: Secondary | ICD-10-CM

## 2014-06-14 DIAGNOSIS — Z5181 Encounter for therapeutic drug level monitoring: Secondary | ICD-10-CM

## 2014-06-14 DIAGNOSIS — K55069 Acute infarction of intestine, part and extent unspecified: Secondary | ICD-10-CM

## 2014-06-14 DIAGNOSIS — D688 Other specified coagulation defects: Secondary | ICD-10-CM

## 2014-06-14 LAB — POCT INR: INR: 2.5

## 2014-07-08 ENCOUNTER — Other Ambulatory Visit: Payer: Self-pay | Admitting: Internal Medicine

## 2014-07-19 ENCOUNTER — Other Ambulatory Visit: Payer: Self-pay

## 2014-07-19 MED ORDER — GABAPENTIN 300 MG PO CAPS: ORAL_CAPSULE | ORAL | Status: DC

## 2014-07-25 ENCOUNTER — Encounter: Payer: Self-pay | Admitting: Internal Medicine

## 2014-07-26 ENCOUNTER — Ambulatory Visit (INDEPENDENT_AMBULATORY_CARE_PROVIDER_SITE_OTHER): Payer: Medicare Other

## 2014-07-26 ENCOUNTER — Encounter: Payer: Self-pay | Admitting: Family

## 2014-07-26 ENCOUNTER — Ambulatory Visit (INDEPENDENT_AMBULATORY_CARE_PROVIDER_SITE_OTHER): Payer: Medicare Other | Admitting: Family

## 2014-07-26 VITALS — BP 120/72 | HR 65 | Temp 98.1°F | Resp 18 | Ht 70.0 in | Wt 219.0 lb

## 2014-07-26 DIAGNOSIS — Z86718 Personal history of other venous thrombosis and embolism: Secondary | ICD-10-CM

## 2014-07-26 DIAGNOSIS — D688 Other specified coagulation defects: Secondary | ICD-10-CM

## 2014-07-26 DIAGNOSIS — R05 Cough: Secondary | ICD-10-CM

## 2014-07-26 DIAGNOSIS — D6851 Activated protein C resistance: Secondary | ICD-10-CM

## 2014-07-26 DIAGNOSIS — K55069 Acute infarction of intestine, part and extent unspecified: Secondary | ICD-10-CM

## 2014-07-26 DIAGNOSIS — R059 Cough, unspecified: Secondary | ICD-10-CM

## 2014-07-26 DIAGNOSIS — I81 Portal vein thrombosis: Secondary | ICD-10-CM

## 2014-07-26 DIAGNOSIS — Z5181 Encounter for therapeutic drug level monitoring: Secondary | ICD-10-CM

## 2014-07-26 LAB — POCT INR: INR: 2

## 2014-07-26 MED ORDER — AMOXICILLIN-POT CLAVULANATE 875-125 MG PO TABS: 1.0000 | ORAL_TABLET | Freq: Two times a day (BID) | ORAL | Status: DC

## 2014-07-26 MED ORDER — HYDROCODONE-HOMATROPINE 5-1.5 MG/5ML PO SYRP: 5.0000 mL | ORAL_SOLUTION | Freq: Three times a day (TID) | ORAL | Status: DC | PRN

## 2014-07-26 NOTE — Patient Instructions (Signed)
Thank you for choosing [facility].  Summary/Instructions:  Your prescription(s) have been submitted to your pharmacy. Please take as directed and contact our office if you believe you are having problem(s) with the medication(s).  Sinusitis Sinusitis is redness, soreness, and inflammation of the paranasal sinuses. Paranasal sinuses are air pockets within the bones of your face (beneath the eyes, the middle of the forehead, or above the eyes). In healthy paranasal sinuses, mucus is able to drain out, and air is able to circulate through them by way of your nose. However, when your paranasal sinuses are inflamed, mucus and air can become trapped. This can allow bacteria and other germs to grow and cause infection. Sinusitis can develop quickly and last only a short time (acute) or continue over a long period (chronic). Sinusitis that lasts for more than 12 weeks is considered chronic.  CAUSES  Causes of sinusitis include:  Allergies.  Structural abnormalities, such as displacement of the cartilage that separates your nostrils (deviated septum), which can decrease the air flow through your nose and sinuses and affect sinus drainage.  Functional abnormalities, such as when the small hairs (cilia) that line your sinuses and help remove mucus do not work properly or are not present. SIGNS AND SYMPTOMS  Symptoms of acute and chronic sinusitis are the same. The primary symptoms are pain and pressure around the affected sinuses. Other symptoms include:  Upper toothache.  Earache.  Headache.  Bad breath.  Decreased sense of smell and taste.  A cough, which worsens when you are lying flat.  Fatigue.  Fever.  Thick drainage from your nose, which often is green and may contain pus (purulent).  Swelling and warmth over the affected sinuses. DIAGNOSIS  Your health care provider will perform a physical exam. During the exam, your health care provider may:  Look in your nose for  signs of abnormal growths in your nostrils (nasal polyps).  Tap over the affected sinus to check for signs of infection.  View the inside of your sinuses (endoscopy) using an imaging device that has a light attached (endoscope). If your health care provider suspects that you have chronic sinusitis, one or more of the following tests may be recommended:  Allergy tests.  Nasal culture. A sample of mucus is taken from your nose, sent to a lab, and screened for bacteria.  Nasal cytology. A sample of mucus is taken from your nose and examined by your health care provider to determine if your sinusitis is related to an allergy. TREATMENT  Most cases of acute sinusitis are related to a viral infection and will resolve on their own within 10 days. Sometimes medicines are prescribed to help relieve symptoms (pain medicine, decongestants, nasal steroid sprays, or saline sprays).  However, for sinusitis related to a bacterial infection, your health care provider will prescribe antibiotic medicines. These are medicines that will help kill the bacteria causing the infection.  Rarely, sinusitis is caused by a fungal infection. In theses cases, your health care provider will prescribe antifungal medicine. For some cases of chronic sinusitis, surgery is needed. Generally, these are cases in which sinusitis recurs more than 3 times per year, despite other treatments. HOME CARE INSTRUCTIONS   Drink plenty of water. Water helps thin the mucus so your sinuses can drain more easily.  Use a humidifier.  Inhale steam 3 to 4 times a day (for example, sit in the bathroom with the shower running).  Apply a warm, moist washcloth to your face 3 to  4 times a day, or as directed by your health care provider.  Use saline nasal sprays to help moisten and clean your sinuses.  Take medicines only as directed by your health care provider.  If you were prescribed either an antibiotic or antifungal medicine, finish it all  even if you start to feel better. SEEK IMMEDIATE MEDICAL CARE IF:  You have increasing pain or severe headaches.  You have nausea, vomiting, or drowsiness.  You have swelling around your face.  You have vision problems.  You have a stiff neck.  You have difficulty breathing. MAKE SURE YOU:   Understand these instructions.  Will watch your condition.  Will get help right away if you are not doing well or get worse. Document Released: 08/04/2005 Document Revised: 12/19/2013 Document Reviewed: 08/19/2011 [facility] Patient Information 2015 [place], [place]. This information is not intended to replace advice given to you by your health care provider. Make sure you discuss any questions you have with your health care provider. If your symptoms worsen or fail to improve, please contact our office for further instruction, or in case of emergency go directly to the emergency room at the closest medical facility.

## 2014-07-26 NOTE — Assessment & Plan Note (Signed)
Symptoms and exam consistent with sinusitis. Start Augmentin x10 days. Start Hycodan as needed for cough and sleep. Continue over-the-counter medications as needed for symptom relief. Follow up if symptoms worsen or fail to improve

## 2014-07-26 NOTE — Progress Notes (Signed)
Pre visit review using our clinic review tool, if applicable. No additional management support is needed unless otherwise documented below in the visit note. 

## 2014-07-26 NOTE — Progress Notes (Signed)
Subjective:    Patient ID: Patrick Hatfield, male    DOB: 1934/08/27, 79 y.o.   MRN: [id number]  Chief Complaint  Patient presents with  . Nasal Congestion    productive cough, nasal congestion, chest congestion, headaches, x5 days    HPI:  Patrick Hatfield is a 79 y.o. male who presents today for an acute visit.  Acute symptoms of productive cough (yellow/thick), nasal congestion, chest congestion, and headache has been going on for approximately 5 days. Describes the coughing has subsided within the last day or so. Continues with congestion and headache. Cough does keep him up at night. Has used the Netti pot which does seem to help as well. Denies any fevers. Thought he was originally getting better, but last night was described as "terrible."  Allergies  Allergen Reactions  . Amlodipine Besy-Benazepril Hcl     REACTION: swelling of feet.  Probably  from Amlodipine component)  . Lipitor [Atorvastatin Calcium]     ? Reaction, ? swelling   Current Outpatient Prescriptions on File Prior to Visit  Medication Sig Dispense Refill  . cholecalciferol (VITAMIN D) 1000 UNITS tablet Take 1,000 Units by mouth daily.      . diphenhydramine-acetaminophen (TYLENOL PM) 25-500 MG TABS Take 1 tablet by mouth at bedtime as needed.    . folic acid (FOLVITE) 528 MCG tablet Take 400 mcg by mouth daily.      Marland Kitchen gabapentin (NEURONTIN) 300 MG capsule TAKE ONE CAPSULE BY MOUTH EVERY 8 HOURS 270 capsule 0  . hyoscyamine (LEVSIN SL) 0.125 MG SL tablet PLACE 1 TABLET UNDER THE TONGUE EVERY 4 HOURS AS NEEDED FOR CRAMPING 30 tablet 0  . hyoscyamine (LEVSIN SL) 0.125 MG SL tablet PLACE 1 TABLET UNDER THE TONGUE EVERY 4 HOURS AS NEEDED FOR CRAMPING 30 tablet 1  . losartan-hydrochlorothiazide (HYZAAR) 100-12.5 MG per tablet TAKE 1 TABLET EVERY DAY 30 tablet 5  . losartan-hydrochlorothiazide (HYZAAR) 100-12.5 MG per tablet TAKE 1 TABLET EVERY DAY 90 tablet 3  . naproxen sodium (ANAPROX) 220 MG tablet Take 220 mg by  mouth 2 (two) times daily with a meal.    . Omega-3 Fatty Acids (FISH OIL) 1200 MG CAPS Take 1,200 mg by mouth 2 (two) times daily.      . Omeprazole Magnesium (PRILOSEC OTC PO) Take by mouth. AS NEEDED IF ACID REFLUX IS REALLY BAD    . propranolol (INDERAL) 40 MG tablet TAKE 1 TABLET BY MOUTH TWICE A DAY 180 tablet 3  . warfarin (COUMADIN) 5 MG tablet Take as directed by anticoagulation clinic 90 tablet 1  . ranitidine (ZANTAC) 150 MG tablet Take 150 mg by mouth as needed.     No current facility-administered medications on file prior to visit.    Review of Systems    See HPI  Objective:    BP 120/72 mmHg  Pulse 65  Temp(Src) 98.1 F (36.7 C) (Oral)  Resp 18  Ht 5\' 10"  (1.778 m)  Wt 219 lb (99.338 kg)  BMI 31.42 kg/m2  SpO2 95% Nursing note and vital signs reviewed.  Physical Exam  Constitutional: He is oriented to person, place, and time. He appears well-developed and well-nourished. No distress.  HENT:  Head: Normocephalic.  Right Ear: Hearing, tympanic membrane, external ear and ear canal normal.  Left Ear: Hearing, tympanic membrane, external ear and ear canal normal.  Nose: Nose normal. Right sinus exhibits no maxillary sinus tenderness and no frontal sinus tenderness. Left sinus exhibits no maxillary sinus tenderness  and no frontal sinus tenderness.  Mouth/Throat: Uvula is midline, oropharynx is clear and moist and mucous membranes are normal.  Cardiovascular: Normal rate, regular rhythm, normal heart sounds and intact distal pulses.   Pulmonary/Chest: Effort normal and breath sounds normal.  Neurological: He is alert and oriented to person, place, and time.  Skin: Skin is warm and dry.  Psychiatric: He has a normal mood and affect. His behavior is normal. Judgment and thought content normal.       Assessment & Plan:

## 2014-08-14 ENCOUNTER — Encounter: Payer: Self-pay | Admitting: Oncology

## 2014-08-24 ENCOUNTER — Other Ambulatory Visit: Payer: Self-pay | Admitting: Internal Medicine

## 2014-09-06 ENCOUNTER — Ambulatory Visit (INDEPENDENT_AMBULATORY_CARE_PROVIDER_SITE_OTHER): Payer: Medicare Other

## 2014-09-06 DIAGNOSIS — Z5181 Encounter for therapeutic drug level monitoring: Secondary | ICD-10-CM | POA: Diagnosis not present

## 2014-09-06 DIAGNOSIS — Z86718 Personal history of other venous thrombosis and embolism: Secondary | ICD-10-CM | POA: Diagnosis not present

## 2014-09-06 DIAGNOSIS — I81 Portal vein thrombosis: Secondary | ICD-10-CM | POA: Diagnosis not present

## 2014-09-06 DIAGNOSIS — K55069 Acute infarction of intestine, part and extent unspecified: Secondary | ICD-10-CM

## 2014-09-06 DIAGNOSIS — D688 Other specified coagulation defects: Secondary | ICD-10-CM | POA: Diagnosis not present

## 2014-09-06 DIAGNOSIS — D6851 Activated protein C resistance: Secondary | ICD-10-CM

## 2014-09-06 LAB — POCT INR: INR: 2.5

## 2014-09-18 ENCOUNTER — Encounter: Payer: Self-pay | Admitting: Oncology

## 2014-09-18 ENCOUNTER — Ambulatory Visit (INDEPENDENT_AMBULATORY_CARE_PROVIDER_SITE_OTHER): Payer: Medicare Other | Admitting: Oncology

## 2014-09-18 VITALS — BP 116/59 | HR 73 | Temp 98.0°F | Ht 70.0 in | Wt 218.3 lb

## 2014-09-18 DIAGNOSIS — D6851 Activated protein C resistance: Secondary | ICD-10-CM | POA: Diagnosis not present

## 2014-09-18 DIAGNOSIS — Z7901 Long term (current) use of anticoagulants: Secondary | ICD-10-CM | POA: Diagnosis not present

## 2014-09-18 DIAGNOSIS — I81 Portal vein thrombosis: Principal | ICD-10-CM

## 2014-09-18 DIAGNOSIS — Z86718 Personal history of other venous thrombosis and embolism: Secondary | ICD-10-CM | POA: Diagnosis not present

## 2014-09-18 DIAGNOSIS — D688 Other specified coagulation defects: Secondary | ICD-10-CM | POA: Diagnosis not present

## 2014-09-18 DIAGNOSIS — K55069 Acute infarction of intestine, part and extent unspecified: Secondary | ICD-10-CM

## 2014-09-18 LAB — CBC WITH DIFFERENTIAL/PLATELET
BASOS ABS: 0.1 10*3/uL (ref 0.0–0.1)
BASOS PCT: 1 % (ref 0–1)
EOS ABS: 0.1 10*3/uL (ref 0.0–0.7)
Eosinophils Relative: 2 % (ref 0–5)
HEMATOCRIT: 46.1 % (ref 39.0–52.0)
HEMOGLOBIN: 16 g/dL (ref 13.0–17.0)
LYMPHS ABS: 2.9 10*3/uL (ref 0.7–4.0)
LYMPHS PCT: 44 % (ref 12–46)
MCH: 32.5 pg (ref 26.0–34.0)
MCHC: 34.7 g/dL (ref 30.0–36.0)
MCV: 93.7 fL (ref 78.0–100.0)
MONO ABS: 0.7 10*3/uL (ref 0.1–1.0)
MPV: 10.8 fL (ref 8.6–12.4)
Monocytes Relative: 11 % (ref 3–12)
Neutro Abs: 2.8 10*3/uL (ref 1.7–7.7)
Neutrophils Relative %: 42 % — ABNORMAL LOW (ref 43–77)
Platelets: 143 10*3/uL — ABNORMAL LOW (ref 150–400)
RBC: 4.92 MIL/uL (ref 4.22–5.81)
RDW: 13.5 % (ref 11.5–15.5)
WBC: 6.6 10*3/uL (ref 4.0–10.5)

## 2014-09-18 LAB — COMPREHENSIVE METABOLIC PANEL
ALBUMIN: 4.5 g/dL (ref 3.5–5.2)
ALT: 54 U/L — ABNORMAL HIGH (ref 0–53)
AST: 36 U/L (ref 0–37)
Alkaline Phosphatase: 56 U/L (ref 39–117)
BUN: 14 mg/dL (ref 6–23)
CALCIUM: 9.6 mg/dL (ref 8.4–10.5)
CHLORIDE: 102 meq/L (ref 96–112)
CO2: 27 meq/L (ref 19–32)
Creat: 0.87 mg/dL (ref 0.50–1.35)
Glucose, Bld: 95 mg/dL (ref 70–99)
Potassium: 4.3 mEq/L (ref 3.5–5.3)
Sodium: 135 mEq/L (ref 135–145)
Total Bilirubin: 2.3 mg/dL — ABNORMAL HIGH (ref 0.2–1.2)
Total Protein: 7.7 g/dL (ref 6.0–8.3)

## 2014-09-18 NOTE — Progress Notes (Signed)
Patient ID: Patrick Hatfield, male   DOB: 09/28/1934, 80 y.o.   MRN: [id number] Hematology and Oncology Follow Up Visit  Patrick Hatfield [id number] Mar 14, 1935 80 y.o. 09/18/2014 7:33 PM   Principle Diagnosis: Encounter Diagnoses  Name Primary?  . Thrombosis of mesenteric vein Yes  . DVT, HX OF   . Factor 5 Leiden mutation, heterozygous    Clinical summary: 80 year old man I initially evaluated in this office in August 2010 subsequent to a hospital consultation to evaluate recurrent thrombosis and mild thrombocytopenia. He presented with acute superior mesenteric vein thrombosis at that time with a previous history of a lower extremity DVT. He was found to be a heterozygote for the Factor V Leiden gene mutation. He has mild thrombocytopenia never less than 100,000, likely due to chronic alcohol use.  I recommended long-term anticoagulation. He has done well and has had no subsequent thrombotic events. He remains on long-term Coumadin anticoagulation.  Interim History:   He is doing very well. He has had no interim medical or surgical problems. He denies any dyspnea, chest pain, palpitations, leg swelling or pain, hematochezia, melena, or hematuria.  Medications: reviewed  Allergies:  Allergies  Allergen Reactions  . Amlodipine Besy-Benazepril Hcl     REACTION: swelling of feet.  Probably  from Amlodipine component)  . Lipitor [Atorvastatin Calcium]     ? Reaction, ? swelling    Review of Systems: See history of present illness Remaining ROS negative:   Physical Exam: Blood pressure 116/59, pulse 73, temperature 98 F (36.7 C), temperature source Oral, height 5\' 10"  (1.778 m), weight 218 lb 4.8 oz (99.02 kg), SpO2 96 %. Wt Readings from Last 3 Encounters:  09/18/14 218 lb 4.8 oz (99.02 kg)  07/26/14 219 lb (99.338 kg)  01/31/14 215 lb 9.6 oz (97.796 kg)     General appearance: Well-nourished Caucasian man HENNT: Pharynx no erythema, exudate, mass, or ulcer. No thyromegaly or  thyroid nodules Lymph nodes: No cervical, supraclavicular, or axillary lymphadenopathy Breasts:  Lungs: Clear to auscultation, resonant to percussion throughout Heart: Regular rhythm, no murmur, no gallop, no rub, no click, no edema Abdomen: Soft, nontender, normal bowel sounds, no mass, no organomegaly Extremities: No edema, no calf tenderness Musculoskeletal: no joint deformities GU:  Vascular: Carotid pulses 2+, no bruits,  Neurologic: Alert, oriented, PERRLA, optic discs sharp and vessels normal, no hemorrhage or exudate, cranial nerves grossly normal, motor strength 5 over 5, reflexes 1+ symmetric, upper body coordination normal, gait normal, Skin: No rash or ecchymosis  Lab Results: CBC W/Diff    Component Value Date/Time   WBC 6.6 09/18/2014 1141   WBC 7.0 07/11/2013 1504   RBC 4.92 09/18/2014 1141   RBC 4.59 07/11/2013 1504   HGB 16.0 09/18/2014 1141   HGB 15.0 07/11/2013 1504   HCT 46.1 09/18/2014 1141   HCT 44.0 07/11/2013 1504   PLT 143* 09/18/2014 1141   PLT 138* 07/11/2013 1504   MCV 93.7 09/18/2014 1141   MCV 95.9 07/11/2013 1504   MCH 32.5 09/18/2014 1141   MCH 32.7 07/11/2013 1504   MCHC 34.7 09/18/2014 1141   MCHC 34.1 07/11/2013 1504   RDW 13.5 09/18/2014 1141   RDW 13.4 07/11/2013 1504   LYMPHSABS 2.9 09/18/2014 1141   LYMPHSABS 2.6 07/11/2013 1504   MONOABS 0.7 09/18/2014 1141   MONOABS 0.6 07/11/2013 1504   EOSABS 0.1 09/18/2014 1141   EOSABS 0.1 07/11/2013 1504   BASOSABS 0.1 09/18/2014 1141   BASOSABS 0.1 07/11/2013 1504  Chemistry      Component Value Date/Time   NA 135 09/18/2014 1141   K 4.3 09/18/2014 1141   CL 102 09/18/2014 1141   CO2 27 09/18/2014 1141   BUN 14 09/18/2014 1141   CREATININE 0.87 09/18/2014 1141   CREATININE 0.9 02/08/2014 1012      Component Value Date/Time   CALCIUM 9.6 09/18/2014 1141   ALKPHOS 56 09/18/2014 1141   AST 36 09/18/2014 1141   ALT 54* 09/18/2014 1141   BILITOT 2.3* 09/18/2014 1141        Radiological Studies: No results found.  Impression:  #1. Coagulopathy related to factor V Leiden gene mutation heterozygote status  #2. Metachronous thrombotic events: Initial lower extremity DVT and subsequent mesenteric vascular thrombosis August 2010 He will continue on chronic Coumadin anticoagulation. 5 mg Tuesday Thursday Saturday, 2.5 mg other days of the week  #3. Mild chronic stable thrombocytopenia-stable Alcohol related?  #4. Fluctuating mild transaminase elevation-stable over time.   CC: Patient Care Team: Hendricks Limes, MD as PCP - General   Annia Belt, MD 2/1/20167:33 PM

## 2014-09-18 NOTE — Patient Instructions (Signed)
To lab today Return visit 1 year   No lab

## 2014-10-18 ENCOUNTER — Ambulatory Visit (INDEPENDENT_AMBULATORY_CARE_PROVIDER_SITE_OTHER): Payer: Medicare Other | Admitting: General Practice

## 2014-10-18 ENCOUNTER — Other Ambulatory Visit (INDEPENDENT_AMBULATORY_CARE_PROVIDER_SITE_OTHER): Payer: Medicare Other

## 2014-10-18 ENCOUNTER — Ambulatory Visit (INDEPENDENT_AMBULATORY_CARE_PROVIDER_SITE_OTHER): Payer: Medicare Other | Admitting: Internal Medicine

## 2014-10-18 ENCOUNTER — Encounter: Payer: Self-pay | Admitting: Internal Medicine

## 2014-10-18 VITALS — BP 116/70 | HR 83 | Temp 98.2°F | Resp 14 | Ht 70.0 in | Wt 219.0 lb

## 2014-10-18 DIAGNOSIS — D688 Other specified coagulation defects: Secondary | ICD-10-CM | POA: Diagnosis not present

## 2014-10-18 DIAGNOSIS — R350 Frequency of micturition: Secondary | ICD-10-CM

## 2014-10-18 DIAGNOSIS — Z87448 Personal history of other diseases of urinary system: Secondary | ICD-10-CM | POA: Diagnosis not present

## 2014-10-18 DIAGNOSIS — R7301 Impaired fasting glucose: Secondary | ICD-10-CM

## 2014-10-18 DIAGNOSIS — Z5181 Encounter for therapeutic drug level monitoring: Secondary | ICD-10-CM

## 2014-10-18 DIAGNOSIS — D6851 Activated protein C resistance: Secondary | ICD-10-CM

## 2014-10-18 DIAGNOSIS — K55069 Acute infarction of intestine, part and extent unspecified: Secondary | ICD-10-CM

## 2014-10-18 DIAGNOSIS — I81 Portal vein thrombosis: Secondary | ICD-10-CM

## 2014-10-18 DIAGNOSIS — Z86718 Personal history of other venous thrombosis and embolism: Secondary | ICD-10-CM

## 2014-10-18 LAB — URINALYSIS
BILIRUBIN URINE: NEGATIVE
KETONES UR: NEGATIVE
Leukocytes, UA: NEGATIVE
Nitrite: NEGATIVE
SPECIFIC GRAVITY, URINE: 1.025 (ref 1.000–1.030)
TOTAL PROTEIN, URINE-UPE24: NEGATIVE
URINE GLUCOSE: NEGATIVE
Urobilinogen, UA: 0.2 (ref 0.0–1.0)
pH: 6 (ref 5.0–8.0)

## 2014-10-18 LAB — POCT INR: INR: 3.8

## 2014-10-18 LAB — HEMOGLOBIN A1C: Hgb A1c MFr Bld: 5.4 % (ref 4.6–6.5)

## 2014-10-18 MED ORDER — MIRABEGRON ER 25 MG PO TB24: 25.0000 mg | ORAL_TABLET | Freq: Every day | ORAL | Status: DC

## 2014-10-18 NOTE — Progress Notes (Signed)
   Subjective:    Patient ID: Patrick Hatfield, male    DOB: [birth year], 80 y.o.   MRN: [id number]  HPI  He describes urinary frequency which has been present for 12 months but has progressed. He urinates 6-8 times per day and 2-4 times per night. This is associated with urgency.  Last week he had some suprapubic low-grade discomfort which resolved without treatment. He has no other active GU or GI symptoms.  He has been evaluated for gross hematuria by a Uologist in 2013; workup was negative. This was in context of warfarin therapy. He also had a TURP. As a teen he was found to have albumin in his urine; no definite diagnosis was made.  He has had fasting hyperglycemia.His father had diabetes. There is no family history of genitourinary problems.  Review of Systems He specifically denies hesitancy, dysuria, hematuria, or pyuria.  He has no polyuria, polyphagia, polydipsia.  Additionally has no fever, chills, sweats, weight loss  He denies numbness or tingling in extremities or nonhealing skin lesions.  He has no hoarseness, dysphagia,melena, rectal bleeding, or small caliber stools which are persistent.    Objective:   Physical Exam  Pertinent or positive findings include: He has a subtle head tremor.  Pattern alopecia is present.  He has a Engineering geologist.  Chest is barrel shaped.  Heart sounds are somewhat distant.  Ventral hernia present. He has isolated DIP flexion and degenerative joint changes. There is marked crepitus of the left knee greater than the right.  Prostate is essentially not palpable.   General appearance :adequately nourished; in no distress Eyes: No conjunctival inflammation or scleral icterus is present. Oral exam:  Lips and gums are healthy appearing.There is uvular erythema ;or exudate noted. Dental hygiene is good. Heart:  Normal rate and regular rhythm. S1 and S2 normal without gallop, murmur, click, rub or other extra sounds   Lungs:Chest clear to  auscultation; no wheezes, rhonchi,rales ,or rubs present.No increased work of breathing.  Abdomen: bowel sounds normal, soft and non-tender without masses, or organomegaly  noted.  No guarding or rebound. No flank tenderness to percussion. GU: genitalia normal. Vascular : all pulses equal ; no bruits present. Skin:Warm & dry.  Intact without suspicious lesions or rashes ; no tenting or jaundice  Lymphatic: No lymphadenopathy is noted about the head, neck, axilla, or inguinal areas.  Neuro: Strength, tone & DTRs normal.       Assessment & Plan:  #1 urinary frequency, progressive  #2 history of macroscopic hematuria without definite etiology except warfarin therapy  #3 Fasting hyperglycemia;family history diabetes  See orders & recommendations

## 2014-10-18 NOTE — Progress Notes (Signed)
Pre visit review using our clinic review tool, if applicable. No additional management support is needed unless otherwise documented below in the visit note. 

## 2014-10-18 NOTE — Patient Instructions (Signed)
  Your next office appointment will be determined based upon review of your pending labs   Those instructions will be transmitted to you through My Chart   Critical values will be called.  Followup as needed for any active or acute issue. Please report any significant change in your symptoms. 

## 2014-10-18 NOTE — Progress Notes (Signed)
Agree with plan 

## 2014-10-19 ENCOUNTER — Encounter: Payer: Self-pay | Admitting: Internal Medicine

## 2014-11-08 ENCOUNTER — Ambulatory Visit: Payer: Medicare Other

## 2014-11-08 ENCOUNTER — Ambulatory Visit (INDEPENDENT_AMBULATORY_CARE_PROVIDER_SITE_OTHER): Payer: Medicare Other | Admitting: Internal Medicine

## 2014-11-08 ENCOUNTER — Encounter: Payer: Self-pay | Admitting: Internal Medicine

## 2014-11-08 ENCOUNTER — Ambulatory Visit (INDEPENDENT_AMBULATORY_CARE_PROVIDER_SITE_OTHER): Payer: Medicare Other | Admitting: General Practice

## 2014-11-08 VITALS — BP 148/82 | HR 75 | Temp 98.0°F | Resp 15 | Ht 70.0 in | Wt 215.0 lb

## 2014-11-08 DIAGNOSIS — D688 Other specified coagulation defects: Secondary | ICD-10-CM | POA: Diagnosis not present

## 2014-11-08 DIAGNOSIS — K2100 Gastro-esophageal reflux disease with esophagitis, without bleeding: Secondary | ICD-10-CM

## 2014-11-08 DIAGNOSIS — Z86718 Personal history of other venous thrombosis and embolism: Secondary | ICD-10-CM

## 2014-11-08 DIAGNOSIS — K5732 Diverticulitis of large intestine without perforation or abscess without bleeding: Secondary | ICD-10-CM

## 2014-11-08 DIAGNOSIS — K55069 Acute infarction of intestine, part and extent unspecified: Secondary | ICD-10-CM

## 2014-11-08 DIAGNOSIS — R1310 Dysphagia, unspecified: Secondary | ICD-10-CM | POA: Diagnosis not present

## 2014-11-08 DIAGNOSIS — Z5181 Encounter for therapeutic drug level monitoring: Secondary | ICD-10-CM

## 2014-11-08 DIAGNOSIS — I81 Portal vein thrombosis: Secondary | ICD-10-CM

## 2014-11-08 DIAGNOSIS — D6851 Activated protein C resistance: Secondary | ICD-10-CM

## 2014-11-08 LAB — POCT INR: INR: 2.5

## 2014-11-08 MED ORDER — AMOXICILLIN-POT CLAVULANATE 875-125 MG PO TABS: 1.0000 | ORAL_TABLET | Freq: Two times a day (BID) | ORAL | Status: DC

## 2014-11-08 MED ORDER — METRONIDAZOLE 500 MG PO TABS: 500.0000 mg | ORAL_TABLET | Freq: Three times a day (TID) | ORAL | Status: DC

## 2014-11-08 NOTE — Progress Notes (Signed)
Pre visit review using our clinic review tool, if applicable. No additional management support is needed unless otherwise documented below in the visit note. 

## 2014-11-08 NOTE — Progress Notes (Signed)
   Subjective:    Patient ID: Patrick Hatfield, male    DOB: 07-28-1935, 80 y.o.   MRN: [id number]  HPI His symptoms began 2-3 weeks ago as bloating, gas,& very loose stools. The symptoms are progressing. He describes pressure and fullness mainly in the left lower quadrant which is constant. This can awaken him. It is up to level III. He will have alternating constipation as well as loose stool. He may be constipated for up to 2 days. Occasionally he has noted even watery stool. He can have 3-4 loose stools a day up to 2 times a week. The symptoms have been associated with an exacerbation of his dyspepsia. He's been taking Zantac, Tums, Pepto-Bismol, & sodium bicarbonate with minimal benefit  He describes some dysphagia,averaging once a week.  In 2012 upper endoscopy revealed Barrett's as well as alkaline gastritis. His last colonoscopy date is unclear upon review the chart. He states that he has had diverticulosis as well as colitis in the past. He's also had a cholecystectomy.  He does drink a cup coffee daily but has no other triggers for reflux.   He has no other cardiovascular, respiratory, genitourinary or GI symptoms  His PT/INR was 2.5 today. Review of Systems  Chest pain, palpitations, tachycardia, exertional dyspnea, paroxysmal nocturnal dyspnea, claudication or edema are absent.  Unexplained weight loss, melena, rectal bleeding, or persistently small caliber stools are denied.  Dysuria, pyuria, hematuria, frequency, nocturia or polyuria are denied.      Objective:   Physical Exam  Pertinent or positive findings include: He has a subtle bobbing tremor of his head.  The uvula is edematous and erythematous. There is no oropharyngeal obstruction.  He has slight tenting of the skin.  Crepitus of the knees is present.  General appearance :adequately nourished; in no distress. Eyes: No conjunctival inflammation or scleral icterus is present. Oral exam:  Lips and gums are  healthy appearing. Dental hygiene is good. Heart:  Normal rate and regular rhythm. S1 and S2 normal without gallop, murmur, click, rub or other extra sounds   Lungs:Chest clear to auscultation; no wheezes, rhonchi,rales ,or rubs present.No increased work of breathing.  Abdomen: bowel sounds normal, soft and non-tender without masses, organomegaly or hernias noted.  No guarding or rebound.  Vascular : all pulses equal ; no bruits present. Skin:Warm & dry.  Intact without suspicious lesions or rashes ; no  jaundice  Lymphatic: No lymphadenopathy is noted about the head, neck, axilla Neuro: Strength, tone  normal.        Assessment & Plan:  #1 left lower quadrant discomfort; rule out active diverticulitis  #2 significant dyspepsia with occasional dysphasia   #3 warfarin therapy  Plan: See orders. GI referral will be made

## 2014-11-08 NOTE — Patient Instructions (Signed)
Please take a probiotic , Florastor OR Align, every day if the bowels are loose. This will replace the normal bacteria which  are necessary for formation of normal stool and processing of food.  Reflux of gastric acid may be asymptomatic as this may occur mainly during sleep.The triggers for reflux  include stress; the "aspirin family" ; alcohol; peppermint; and caffeine (coffee, tea, cola, and chocolate). The aspirin family would include aspirin and the nonsteroidal agents such as ibuprofen &  Naproxen. Tylenol would not cause reflux. If having symptoms ; food & drink should be avoided for @ least 2 hours before going to bed.   PT/INR is therapeutic today. No change in warfarin dose; repeat PT/INR Monday 11/13/14 because of new medications.

## 2014-11-08 NOTE — Progress Notes (Signed)
Agree with plan 

## 2014-11-11 ENCOUNTER — Encounter: Payer: Self-pay | Admitting: Internal Medicine

## 2014-11-14 ENCOUNTER — Ambulatory Visit (INDEPENDENT_AMBULATORY_CARE_PROVIDER_SITE_OTHER): Payer: Medicare Other | Admitting: General Practice

## 2014-11-14 DIAGNOSIS — Z5181 Encounter for therapeutic drug level monitoring: Secondary | ICD-10-CM

## 2014-11-14 DIAGNOSIS — I81 Portal vein thrombosis: Secondary | ICD-10-CM | POA: Diagnosis not present

## 2014-11-14 LAB — POCT INR: INR: 2.5

## 2014-11-14 NOTE — Progress Notes (Signed)
Agree with plan 

## 2014-11-14 NOTE — Progress Notes (Signed)
Pre visit review using our clinic review tool, if applicable. No additional management support is needed unless otherwise documented below in the visit note. 

## 2014-11-21 ENCOUNTER — Telehealth: Payer: Self-pay

## 2014-11-21 ENCOUNTER — Ambulatory Visit (INDEPENDENT_AMBULATORY_CARE_PROVIDER_SITE_OTHER): Payer: Medicare Other | Admitting: Internal Medicine

## 2014-11-21 ENCOUNTER — Encounter: Payer: Self-pay | Admitting: Internal Medicine

## 2014-11-21 VITALS — BP 100/60 | HR 72 | Ht 70.0 in | Wt 220.4 lb

## 2014-11-21 DIAGNOSIS — R1319 Other dysphagia: Secondary | ICD-10-CM

## 2014-11-21 DIAGNOSIS — R1314 Dysphagia, pharyngoesophageal phase: Secondary | ICD-10-CM | POA: Diagnosis not present

## 2014-11-21 DIAGNOSIS — D6851 Activated protein C resistance: Secondary | ICD-10-CM

## 2014-11-21 DIAGNOSIS — Z86010 Personal history of colonic polyps: Secondary | ICD-10-CM | POA: Diagnosis not present

## 2014-11-21 DIAGNOSIS — K219 Gastro-esophageal reflux disease without esophagitis: Secondary | ICD-10-CM

## 2014-11-21 DIAGNOSIS — Z7901 Long term (current) use of anticoagulants: Secondary | ICD-10-CM

## 2014-11-21 DIAGNOSIS — R1310 Dysphagia, unspecified: Secondary | ICD-10-CM

## 2014-11-21 MED ORDER — PANTOPRAZOLE SODIUM 40 MG PO TBEC: 40.0000 mg | DELAYED_RELEASE_TABLET | Freq: Every day | ORAL | Status: DC

## 2014-11-21 NOTE — Telephone Encounter (Signed)
[facility] [address] [place] [address]  11/21/2014   RE: Patrick Hatfield DOB: 12-20-1934 MRN: [id number]   Dear Unice Cobble M.D.,    We have scheduled the above patient for an endoscopic procedure. Our records show that he is on anticoagulation therapy.   Please advise as to how long the patient may come off his therapy of warfarin prior to the EGD & colonoscopy procedure, which is scheduled for 12/18/14.  Please arrange bridging for patient.    Please fax back/ or route the completed form to Bernal Luhman [place], [place] at [phone].   Sincerely,    Silvano Rusk, M.D.

## 2014-11-21 NOTE — Patient Instructions (Signed)
You have been scheduled for an endoscopy and colonoscopy. Please follow the written instructions given to you at your visit today. Please pick up your prep supplies at the pharmacy within the next 1-3 days. If you use inhalers (even only as needed), please bring them with you on the day of your procedure.   You will be contaced by our office prior to your procedure for directions on holding your coumadin and bridging.  If you do not hear from our office 1 week prior to your scheduled procedure, please call [phone] to discuss.   We have sent the following medications to your pharmacy for you to pick up at your convenience: Pantroprazole  I appreciate the opportunity to care for you.

## 2014-11-21 NOTE — Progress Notes (Signed)
Subjective:    Patient ID: Patrick Hatfield, male    DOB: 16-Apr-1935, 80 y.o.   MRN: [id number] Cc: stomach prolems HPI The patient is here because of recent problems with abdominal pain, bloating and borborygmi and loose stools. This started in January 2016. He saw Dr. Linna Darner last month and was dx with diverticulitis - treated with Augmentin and metronidazole and is better.  He also c/o worsening reflux. Has seen Dr. Sharlett Iles in past and had an EGD lst in 2012 with gastritis, ? Barrett's (no bx) and hiatal hernia. Had been having chest pain then. He was able to come off PPI in past and use ranitidine after diet changes but has had increasing heartburn and regurgitation of late.  Also having dysphagia to solids - he has trained himself to eat slowly but when he does have dysphagia he has a suprasternal sticking point. This happens 1x/week. Drinking water allows food bolus to pass.  Allergies  Allergen Reactions  . Amlodipine Besy-Benazepril Hcl     REACTION: swelling of feet.  Probably  from Amlodipine component)  . Lipitor [Atorvastatin Calcium]     ? Reaction, ? swelling   Outpatient Prescriptions Prior to Visit  Medication Sig Dispense Refill  . cholecalciferol (VITAMIN D) 1000 UNITS tablet Take 1,000 Units by mouth daily.      . diphenhydramine-acetaminophen (TYLENOL PM) 25-500 MG TABS Take 1 tablet by mouth at bedtime as needed.    . folic acid (FOLVITE) 888 MCG tablet Take 400 mcg by mouth daily.      Marland Kitchen gabapentin (NEURONTIN) 300 MG capsule TAKE ONE CAPSULE BY MOUTH EVERY 8 HOURS 270 capsule 0  . hyoscyamine (LEVSIN SL) 0.125 MG SL tablet PLACE 1 TABLET UNDER THE TONGUE EVERY 4 HOURS AS NEEDED FOR CRAMPING 30 tablet 1  . losartan-hydrochlorothiazide (HYZAAR) 100-12.5 MG per tablet TAKE 1 TABLET EVERY DAY 90 tablet 3  . Omega-3 Fatty Acids (FISH OIL) 1200 MG CAPS Take 1,200 mg by mouth 2 (two) times daily.      . propranolol (INDERAL) 40 MG tablet TAKE 1 TABLET BY MOUTH TWICE A DAY  180 tablet 3  . ranitidine (ZANTAC) 150 MG tablet Take 150 mg by mouth as needed.    . warfarin (COUMADIN) 5 MG tablet TAKE AS DIRECTED BY ANTICOAGULATION CLINIC 90 tablet 0  . naproxen sodium (ANAPROX) 220 MG tablet Take 220 mg by mouth 2 (two) times daily with a meal.    . Omeprazole Magnesium (PRILOSEC OTC PO) Take by mouth. AS NEEDED IF ACID REFLUX IS REALLY BAD    . amoxicillin-clavulanate (AUGMENTIN) 875-125 MG per tablet Take 1 tablet by mouth 2 (two) times daily. 14 tablet 0  . metroNIDAZOLE (FLAGYL) 500 MG tablet Take 1 tablet (500 mg total) by mouth 3 (three) times daily. 21 tablet 0  . mirabegron ER (MYRBETRIQ) 25 MG TB24 tablet Take 1 tablet (25 mg total) by mouth daily. 30 tablet 2   No facility-administered medications prior to visit.   Past Medical History  Diagnosis Date  . Proteinuria age 49  . Gilbert's syndrome   . Benign essential tremor   . Colitis in 20's  . Depression 2006    drug overdose  . Personal history of colonic polyps 05/28/2007    adenomatous  . Insomnia   . 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 Esophageal reflux   . DVT (deep venous thrombosis) 1991 & 2010    X 2 in  context Factor 5 def  . Diverticulosis   . Hiatal hernia   . Umbilical hernia   . H/O ischemic bowel disease 2010    while off warfarin   . Factor 5 Leiden mutation, heterozygous   . Herpes zoster 2010    facial  . 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 Colon polyps   . Barrett's esophagus    Past Surgical History  Procedure Laterality Date  . Knee arthroscopy Bilateral 2008, 2012    Dr.Aplington bilateral  . Tonsillectomy    . Transurethral resection of prostate       BPH;Dr Dahlstedt  . Cystoscopy      Neg  . Cholecystectomy  05/16/2011     Dr Marlou Starks  . Colonoscopy      Dr Sharlett Iles   History   Social History  . Marital Status: Married    Spouse Name: N/A  . Number of Children: 4  . Years of Education: N/A   Occupational  History  . Retired    Social History Main Topics  . Smoking status: Former Smoker    Types: Cigarettes    Quit date: 08/18/1994  . Smokeless tobacco: Never Used     Comment: smoked 1956-1996, up to 2 ppd  . Alcohol Use: 1.2 oz/week    2 Cans of beer per week     Comment: 2-3 beers/week.  . Drug Use: No  . Sexual Activity: Not on file   Other Topics Concern  . None   Social History Narrative   Family History  Problem Relation Age of Onset  . Pancreatic cancer Father 67  . Ulcers Father   . Stroke Father 23  . Diabetes Father   . Coronary artery disease Mother   . Osteoporosis Mother   . HIV Brother   . Multiple sclerosis Sister   . Heart attack Paternal Grandfather     early 107s  . Heart attack Paternal Uncle     2 uncles in early 1s  . Aortic aneurysm Brother   . Colon cancer Neg Hx   . Esophageal cancer Neg Hx   . Stomach cancer Neg Hx         Review of Systems + arthritis, insomnia, excessive urination and some leakage All other ROS negative or as per HPI    Objective:   Physical Exam  _0  100/60 mmHg  Pulse 72  Ht 5' 10" (1.778 m)  Wt 220 lb 6 oz (99.961 kg)  BMI 31.62 kg/m2@  General:  Well-developed, well-nourished and in no acute distress Eyes:  anicteric. ENT:   Mouth and posterior pharynx free of lesions.  Neck:   supple w/o thyromegaly or mass.  Lungs: Clear to auscultation bilaterally. Heart:  S1S2, no rubs, murmurs, gallops. Abdomen:  soft, non-tender, no hepatosplenomegaly, hernia, or mass and BS+.  Rectal: deferred Lymph:  no cervical or supraclavicular adenopathy. Extremities:   no edema, cyanosis or clubbing Skin   no rash. Neuro:  A&O x 3.  Psych:  appropriate mood and  Affect.   Data Reviewed:  As per HPI EGD's + images, pathology, colonoscopy, pathology, labs from 2016, PCP notes 2016     Assessment & Plan:  Esophageal dysphagia -  Gastroesophageal reflux disease, esophagitis presence not specified -   Hx of  adenomatous colonic polyps -   On warfarin therapy  Factor V Leiden mutation  Needs an EGD with possible dilation of esophagus Needs a repeat colonoscopy - adenoma 2008  Holding warfarin and using Lovenox bridge best I think given Factor V Leiden issues Restart a a PPI - pantoprazole 40 mg daily The risks and benefits as well as alternatives of endoscopic procedure(s) have been discussed and reviewed. All questions answered. The patient agrees to proceed. He has extra risks and is more complicated given need for anticoagulation and holding that - is at risk of thrombosis and embolus and complications while off anti-coagulation - we will minimize that with window as above   Cc:Unice Cobble, MD

## 2014-11-22 ENCOUNTER — Encounter: Payer: Self-pay | Admitting: Internal Medicine

## 2014-11-22 ENCOUNTER — Ambulatory Visit: Payer: Medicare Other | Admitting: Gastroenterology

## 2014-11-22 NOTE — Telephone Encounter (Signed)
PMH of DVT X 2 & PTE X 1  Interval off warfarin needs to be as short as possible Please advise

## 2014-11-22 NOTE — Telephone Encounter (Signed)
I was hoping to have him bridged with Lovenox Can the A/C clinic in [place] arrange that?  We could get advice from [facility] I bet.

## 2014-11-22 NOTE — Telephone Encounter (Signed)
Jenny Reichmann can you arrange Lovenox bridging peri operatively. Thanks, [employer]

## 2014-11-30 ENCOUNTER — Encounter: Payer: Self-pay | Admitting: Internal Medicine

## 2014-12-05 ENCOUNTER — Other Ambulatory Visit: Payer: Self-pay | Admitting: General Practice

## 2014-12-05 ENCOUNTER — Encounter: Payer: Self-pay | Admitting: Internal Medicine

## 2014-12-05 ENCOUNTER — Telehealth: Payer: Self-pay | Admitting: General Practice

## 2014-12-05 MED ORDER — ENOXAPARIN SODIUM 150 MG/ML ~~LOC~~ SOLN: 150.0000 mg | SUBCUTANEOUS | Status: DC

## 2014-12-05 NOTE — Telephone Encounter (Signed)
Patrick Hatfield the patient sent a mychart message about bridging.  Can you please reach out to the patient.  He is scheduled for a 12/18/14 procedure.

## 2014-12-05 NOTE — Telephone Encounter (Signed)
Instructions for coumadin and Lovenox pre and post procedure.  4/27 - Last dose of coumadin 4/28 - Nothing 4/29 - Lovenox in the AM 4/30 - Lovenox in the AM 5/1 - Lovenox in the AM 5/2 - Procedure (NO LOVENOX) 5/3 - Lovenox in the AM and 7.5 mg coumadin 5/4 - Lovenox in the AM and 7.5 mg coumadin 5/5 - Lovenox in the AM and 5 mg coumadin 5/6 - Lovenox in the AM and 5 mg coumadin and re-check in clinic.  Patient verbalizes understanding.

## 2014-12-06 ENCOUNTER — Ambulatory Visit: Payer: Medicare Other

## 2014-12-07 ENCOUNTER — Other Ambulatory Visit: Payer: Self-pay | Admitting: Internal Medicine

## 2014-12-12 ENCOUNTER — Ambulatory Visit (INDEPENDENT_AMBULATORY_CARE_PROVIDER_SITE_OTHER): Payer: Medicare Other | Admitting: General Practice

## 2014-12-12 DIAGNOSIS — Z5181 Encounter for therapeutic drug level monitoring: Secondary | ICD-10-CM

## 2014-12-12 LAB — POCT INR: INR: 3.1

## 2014-12-12 NOTE — Progress Notes (Signed)
Agree with plan 

## 2014-12-12 NOTE — Progress Notes (Signed)
Pre visit review using our clinic review tool, if applicable. No additional management support is needed unless otherwise documented below in the visit note. 

## 2014-12-12 NOTE — Patient Instructions (Signed)
Instructions for coumadin and Lovenox pre and post procedure.  4/27 - Last dose of coumadin 4/28 - Nothing 4/29 - Lovenox in the AM 4/30 - Lovenox in the AM 5/1 - Lovenox in the AM 5/2 - Procedure (NO LOVENOX) 5/3 - Lovenox in the AM and 7.5 mg coumadin 5/4 - Lovenox in the AM and 7.5 mg coumadin 5/5 - Lovenox in the AM and 5 mg coumadin 5/6 - Lovenox in the AM and 5 mg coumadin and re-check in clinic.  Patient verbalizes understanding.

## 2014-12-14 DIAGNOSIS — Z86010 Personal history of colonic polyps: Secondary | ICD-10-CM

## 2014-12-14 DIAGNOSIS — Z860101 Personal history of adenomatous and serrated colon polyps: Secondary | ICD-10-CM

## 2014-12-14 HISTORY — DX: Personal history of adenomatous and serrated colon polyps: Z86.0101

## 2014-12-14 HISTORY — DX: Personal history of colonic polyps: Z86.010

## 2014-12-16 ENCOUNTER — Other Ambulatory Visit: Payer: Self-pay | Admitting: Family Medicine

## 2014-12-18 ENCOUNTER — Ambulatory Visit (AMBULATORY_SURGERY_CENTER): Payer: Medicare Other | Admitting: Internal Medicine

## 2014-12-18 ENCOUNTER — Encounter: Payer: Self-pay | Admitting: Internal Medicine

## 2014-12-18 VITALS — BP 143/75 | HR 61 | Temp 95.9°F | Resp 19 | Ht 70.0 in | Wt 220.0 lb

## 2014-12-18 DIAGNOSIS — D123 Benign neoplasm of transverse colon: Secondary | ICD-10-CM | POA: Diagnosis not present

## 2014-12-18 DIAGNOSIS — D128 Benign neoplasm of rectum: Secondary | ICD-10-CM | POA: Diagnosis not present

## 2014-12-18 DIAGNOSIS — Z860100 Personal history of colon polyps, unspecified: Secondary | ICD-10-CM

## 2014-12-18 DIAGNOSIS — Q393 Congenital stenosis and stricture of esophagus: Secondary | ICD-10-CM

## 2014-12-18 DIAGNOSIS — D124 Benign neoplasm of descending colon: Secondary | ICD-10-CM

## 2014-12-18 DIAGNOSIS — R1310 Dysphagia, unspecified: Secondary | ICD-10-CM

## 2014-12-18 DIAGNOSIS — D122 Benign neoplasm of ascending colon: Secondary | ICD-10-CM | POA: Diagnosis not present

## 2014-12-18 DIAGNOSIS — K222 Esophageal obstruction: Secondary | ICD-10-CM

## 2014-12-18 DIAGNOSIS — D129 Benign neoplasm of anus and anal canal: Secondary | ICD-10-CM

## 2014-12-18 DIAGNOSIS — I10 Essential (primary) hypertension: Secondary | ICD-10-CM | POA: Diagnosis not present

## 2014-12-18 DIAGNOSIS — R1314 Dysphagia, pharyngoesophageal phase: Secondary | ICD-10-CM

## 2014-12-18 DIAGNOSIS — R1319 Other dysphagia: Secondary | ICD-10-CM

## 2014-12-18 MED ORDER — SODIUM CHLORIDE 0.9 % IV SOLN: 500.0000 mL | INTRAVENOUS | Status: DC

## 2014-12-18 NOTE — Op Note (Signed)
[place]  [address] [place], [id number]   ENDOSCOPY PROCEDURE REPORT  PATIENT: Patrick Hatfield, Patrick Hatfield  MR#: [id number] BIRTHDATE: 03-23-1935 , 80  yrs. old GENDER: male ENDOSCOPIST: Gatha Mayer, MD, [facility] PROCEDURE DATE:  12/18/2014 PROCEDURE:  EGD w/ balloon dilation ASA CLASS:     Class II INDICATIONS:  dysphagia. MEDICATIONS: Propofol 150 mg IV, Monitored anesthesia care, and Lidocaine 40 mg IV TOPICAL ANESTHETIC: none  DESCRIPTION OF PROCEDURE: After the risks benefits and alternatives of the procedure were thoroughly explained, informed consent was obtained.  The LB [id number] [id number] endoscope was introduced through the mouth and advanced to the second portion of the duodenum , Without limitations.  The instrument was slowly withdrawn as the mucosa was fully examined.    1) Ring in distal esophagus  2) 4-5 cm sliding hiatal hernia 3) Otherwise normal - no Barrett's seen 4) Successful 18 mm balloon dilation of ring w/ slight heme as expected.  Retroflexed views revealed a hiatal hernia.     The scope was then withdrawn from the patient and the procedure completed.  COMPLICATIONS: There were no immediate complications.  ENDOSCOPIC IMPRESSION: 1) Ring in distal esophagus 2) 4-5 cm sliding hiatal hernia 3) Otherwise normal - no Barrett's seen 4) Successful 18 mm balloon dilation of ring w/ slight heme as expected  RECOMMENDATIONS: 1.  Clear liquids until 4PM, then soft foods rest of day.  Resume prior diet tomorrow. 2.  Proceed with a Colonoscopy. 3.  Continue PPI   eSigned:  Gatha Mayer, MD, [facility] 12/18/2014 3:05 PM    RT:[id number] Linna Darner, MD and The Patient

## 2014-12-18 NOTE — Progress Notes (Signed)
Stable to RR 

## 2014-12-18 NOTE — Progress Notes (Signed)
Called to room to assist during endoscopic procedure.  Patient ID and intended procedure confirmed with present staff. Received instructions for my participation in the procedure from the performing physician.  

## 2014-12-18 NOTE — Op Note (Signed)
[place]  [address]. [place], [id number]   COLONOSCOPY PROCEDURE REPORT  PATIENT: Patrick Hatfield, Patrick Hatfield  MR#: [id number] BIRTHDATE: 29-Nov-1934 , 80  yrs. old GENDER: male ENDOSCOPIST: Gatha Mayer, MD, [facility] PROCEDURE DATE:  12/18/2014 PROCEDURE:   Colonoscopy with biopsy and Colonoscopy with snare polypectomy First Screening Colonoscopy - Avg.  risk and is 50 yrs.  old or older - No.  Prior Negative Screening - Now for repeat screening. N/A  History of Adenoma - Now for follow-up colonoscopy & has been > or = to 3 yrs.  Yes hx of adenoma.  Has been 3 or more years since last colonoscopy. ASA CLASS:   Class II INDICATIONS:Surveillance due to prior colonic neoplasia and PH Colon Adenoma. MEDICATIONS: Propofol 350 mg IV, Monitored anesthesia care, and Residual sedation present  DESCRIPTION OF PROCEDURE:   After the risks benefits and alternatives of the procedure were thoroughly explained, informed consent was obtained.  The digital rectal exam revealed no abnormalities of the rectum, revealed no prostatic nodules, and revealed the prostate was not enlarged.   The LB [id number] endoscope was introduced through the anus and advanced to the cecum, which was identified by both the appendix and ileocecal valve. No adverse events experienced.   The quality of the prep was good.  (MiraLax was used)  The instrument was then slowly withdrawn as the colon was fully examined.  COLON FINDINGS: Five sessile polyps ranging from 2 to 66mm in size were found in the transverse colon, ascending colon, rectum, and descending colon.  Polypectomies were performed with cold forceps (ascending) and with a cold snare (others).  The resection was complete, the polyp tissue was completely retrieved and sent to histology.   There was severe diverticulosis noted in the sigmoid colon with associated colonic narrowing and muscular hypertrophy. The examination was otherwise normal.    The colon was redundant. Manual abdominal counter-pressure was used to reach the cecum. Retroflexed views revealed no abnormalities. The time to cecum = 12.6 Withdrawal time = 16.4   The scope was withdrawn and the procedure completed. COMPLICATIONS: There were no immediate complications.  ENDOSCOPIC IMPRESSION: 1.   Five sessile polyps ranging from 2 to 35mm in size were found in the transverse colon, ascending colon, rectum, and descending colon; polypectomies were performed with cold forceps and with a cold snare 2.   There was severe diverticulosis noted in the sigmoid colon 3.   The examination was otherwise normal 4.   The colon was redundant  RECOMMENDATIONS: 1.  Await pathology results 2.  Unlikely to need another routine colonoscopy. Age and findings. 3. resume Lovenox and warfarin as directed tomorrow  eSigned:  Gatha Mayer, MD, [facility] 12/18/2014 3:14 PM  cc: The Patient and Unice Cobble, MD

## 2014-12-18 NOTE — Patient Instructions (Addendum)
I dilated a narrow area in the esophagus called a ring to help you swallow better. You also have a hiatal hernia.  I found and removed 5 small polyps from the colon and rectum. They all look benign. I do not anticipate recommending a repeat routine colonoscopy given your age.  Diverticulosis was also seen as expected.  Please follow diet guidelines today and tomorrow.  Resume Lovenox and warfarin as described below.  Follow-up in anti-coagulation clinic as instructed previously.   5/3 - Lovenox in the AM and 7.5 mg coumadin 5/4 - Lovenox in the AM and 7.5 mg coumadin 5/5 - Lovenox in the AM and 5 mg coumadin 5/6 - Lovenox in the AM and 5 mg coumadin and re-check in clinic.   I appreciate the opportunity to care for you. Gatha Mayer, MD, FACG  YOU HAD AN ENDOSCOPIC PROCEDURE TODAY AT [facility]:   Refer to the procedure report that was given to you for any specific questions about what was found during the examination.  If the procedure report does not answer your questions, please call your gastroenterologist to clarify.  If you requested that your care partner not be given the details of your procedure findings, then the procedure report has been included in a sealed envelope for you to review at your convenience later.  YOU SHOULD EXPECT: Some feelings of bloating in the abdomen. Passage of more gas than usual.  Walking can help get rid of the air that was put into your GI tract during the procedure and reduce the bloating. If you had a lower endoscopy (such as a colonoscopy or flexible sigmoidoscopy) you may notice spotting of blood in your stool or on the toilet paper. If you underwent a bowel prep for your procedure, you may not have a normal bowel movement for a few days.  Please Note:  You might notice some irritation and congestion in your nose or some drainage.  This is from the oxygen used during your procedure.  There is no need for concern and it  should clear up in a day or so.  SYMPTOMS TO REPORT IMMEDIATELY:   Following lower endoscopy (colonoscopy or flexible sigmoidoscopy):  Excessive amounts of blood in the stool  Significant tenderness or worsening of abdominal pains  Swelling of the abdomen that is new, acute  Fever of 100F or higher   Following upper endoscopy (EGD)  Vomiting of blood or coffee ground material  New chest pain or pain under the shoulder blades  Painful or persistently difficult swallowing  New shortness of breath  Fever of 100F or higher  Black, tarry-looking stools  For urgent or emergent issues, a gastroenterologist can be reached at any hour by calling [phone].   DIET: Your first meal following the procedure should be a small meal and then it is ok to progress to your normal diet. Heavy or fried foods are harder to digest and may make you feel nauseous or bloated.  Likewise, meals heavy in dairy and vegetables can increase bloating.  Drink plenty of fluids but you should avoid alcoholic beverages for 24 hours.  ACTIVITY:  You should plan to take it easy for the rest of today and you should NOT DRIVE or use heavy machinery until tomorrow (because of the sedation medicines used during the test).    FOLLOW UP: Our staff will call the number listed on your records the next business day following your procedure to check on you and  address any questions or concerns that you may have regarding the information given to you following your procedure. If we do not reach you, we will leave a message.  However, if you are feeling well and you are not experiencing any problems, there is no need to return our call.  We will assume that you have returned to your regular daily activities without incident.  If any biopsies were taken you will be contacted by phone or by letter within the next 1-3 weeks.  Please call us at [phone] if you have not heard about the biopsies in 3 weeks.     SIGNATURES/CONFIDENTIALITY: You and/or your care partner have signed paperwork which will be entered into your electronic medical record.  These signatures attest to the fact that that the information above on your After Visit Summary has been reviewed and is understood.  Full responsibility of the confidentiality of this discharge information lies with you and/or your care-partner.  Discharge instructions given to patient and/or care partner and handout provided.

## 2014-12-19 ENCOUNTER — Other Ambulatory Visit: Payer: Self-pay

## 2014-12-19 ENCOUNTER — Other Ambulatory Visit: Payer: Self-pay | Admitting: Internal Medicine

## 2014-12-19 ENCOUNTER — Telehealth: Payer: Self-pay

## 2014-12-19 MED ORDER — PANTOPRAZOLE SODIUM 40 MG PO TBEC: 40.0000 mg | DELAYED_RELEASE_TABLET | Freq: Every day | ORAL | Status: DC

## 2014-12-19 NOTE — Telephone Encounter (Signed)
  Follow up Call-  Call back number 12/18/2014  Post procedure Call Back phone  # [phone]  Permission to leave phone message Yes     Patient questions:  Do you have a fever, pain , or abdominal swelling? No. Pain Score  0 *  Have you tolerated food without any problems? Yes.    Have you been able to return to your normal activities? Yes.    Do you have any questions about your discharge instructions: Diet   No. Medications  No. Follow up visit  No.  Do you have questions or concerns about your Care? No.  Actions: * If pain score is 4 or above: No action needed, pain <4.   No problems per the pt. maw

## 2014-12-20 ENCOUNTER — Other Ambulatory Visit: Payer: Self-pay | Admitting: General Practice

## 2014-12-20 MED ORDER — WARFARIN SODIUM 5 MG PO TABS: ORAL_TABLET | ORAL | Status: DC

## 2014-12-26 ENCOUNTER — Encounter: Payer: Self-pay | Admitting: Internal Medicine

## 2014-12-26 ENCOUNTER — Ambulatory Visit (INDEPENDENT_AMBULATORY_CARE_PROVIDER_SITE_OTHER): Payer: Medicare Other | Admitting: General Practice

## 2014-12-26 DIAGNOSIS — Z5181 Encounter for therapeutic drug level monitoring: Secondary | ICD-10-CM

## 2014-12-26 DIAGNOSIS — Z86718 Personal history of other venous thrombosis and embolism: Secondary | ICD-10-CM | POA: Diagnosis not present

## 2014-12-26 DIAGNOSIS — I81 Portal vein thrombosis: Secondary | ICD-10-CM

## 2014-12-26 DIAGNOSIS — D688 Other specified coagulation defects: Secondary | ICD-10-CM

## 2014-12-26 DIAGNOSIS — Z86010 Personal history of colonic polyps: Secondary | ICD-10-CM

## 2014-12-26 DIAGNOSIS — D6851 Activated protein C resistance: Secondary | ICD-10-CM

## 2014-12-26 DIAGNOSIS — K55069 Acute infarction of intestine, part and extent unspecified: Secondary | ICD-10-CM

## 2014-12-26 LAB — POCT INR: INR: 2.3

## 2014-12-26 NOTE — Progress Notes (Signed)
Agree with plan 

## 2014-12-26 NOTE — Progress Notes (Signed)
Quick Note:  5 adenomas max 6 mm Given age, comorbidiities and findings I do not recommend a routine repeat colonoscopy ______

## 2014-12-26 NOTE — Progress Notes (Signed)
Pre visit review using our clinic review tool, if applicable. No additional management support is needed unless otherwise documented below in the visit note. 

## 2015-01-10 ENCOUNTER — Other Ambulatory Visit: Payer: Self-pay | Admitting: Internal Medicine

## 2015-01-16 ENCOUNTER — Other Ambulatory Visit: Payer: Self-pay

## 2015-01-16 ENCOUNTER — Other Ambulatory Visit: Payer: Self-pay | Admitting: Internal Medicine

## 2015-01-16 MED ORDER — HYOSCYAMINE SULFATE 0.125 MG SL SUBL: SUBLINGUAL_TABLET | SUBLINGUAL | Status: DC

## 2015-01-24 ENCOUNTER — Ambulatory Visit (INDEPENDENT_AMBULATORY_CARE_PROVIDER_SITE_OTHER): Payer: Medicare Other | Admitting: *Deleted

## 2015-01-24 DIAGNOSIS — Z5181 Encounter for therapeutic drug level monitoring: Secondary | ICD-10-CM

## 2015-01-24 DIAGNOSIS — I81 Portal vein thrombosis: Secondary | ICD-10-CM | POA: Diagnosis not present

## 2015-01-24 DIAGNOSIS — D6851 Activated protein C resistance: Secondary | ICD-10-CM

## 2015-01-24 DIAGNOSIS — D688 Other specified coagulation defects: Secondary | ICD-10-CM | POA: Diagnosis not present

## 2015-01-24 DIAGNOSIS — Z86718 Personal history of other venous thrombosis and embolism: Secondary | ICD-10-CM

## 2015-01-24 DIAGNOSIS — K55069 Acute infarction of intestine, part and extent unspecified: Secondary | ICD-10-CM

## 2015-01-24 LAB — POCT INR: INR: 3.4

## 2015-01-24 NOTE — Progress Notes (Signed)
I have reviewed and agree with the plan. 

## 2015-01-24 NOTE — Progress Notes (Signed)
Pre visit review using our clinic review tool, if applicable. No additional management support is needed unless otherwise documented below in the visit note. 

## 2015-02-12 ENCOUNTER — Other Ambulatory Visit: Payer: Self-pay

## 2015-02-21 ENCOUNTER — Other Ambulatory Visit: Payer: Self-pay | Admitting: Internal Medicine

## 2015-02-21 ENCOUNTER — Ambulatory Visit (INDEPENDENT_AMBULATORY_CARE_PROVIDER_SITE_OTHER): Payer: Medicare Other | Admitting: General Practice

## 2015-02-21 DIAGNOSIS — D688 Other specified coagulation defects: Secondary | ICD-10-CM | POA: Diagnosis not present

## 2015-02-21 DIAGNOSIS — Z86718 Personal history of other venous thrombosis and embolism: Secondary | ICD-10-CM | POA: Diagnosis not present

## 2015-02-21 DIAGNOSIS — I81 Portal vein thrombosis: Secondary | ICD-10-CM | POA: Diagnosis not present

## 2015-02-21 DIAGNOSIS — K55069 Acute infarction of intestine, part and extent unspecified: Secondary | ICD-10-CM

## 2015-02-21 DIAGNOSIS — D6851 Activated protein C resistance: Secondary | ICD-10-CM

## 2015-02-21 DIAGNOSIS — Z5181 Encounter for therapeutic drug level monitoring: Secondary | ICD-10-CM | POA: Diagnosis not present

## 2015-02-21 LAB — POCT INR: INR: 3.1

## 2015-02-21 NOTE — Progress Notes (Signed)
Pre visit review using our clinic review tool, if applicable. No additional management support is needed unless otherwise documented below in the visit note. 

## 2015-02-21 NOTE — Progress Notes (Signed)
I have reviewed and agree with the plan. 

## 2015-02-28 DIAGNOSIS — H2513 Age-related nuclear cataract, bilateral: Secondary | ICD-10-CM | POA: Diagnosis not present

## 2015-02-28 DIAGNOSIS — H52203 Unspecified astigmatism, bilateral: Secondary | ICD-10-CM | POA: Diagnosis not present

## 2015-03-08 DIAGNOSIS — W230XXA Caught, crushed, jammed, or pinched between moving objects, initial encounter: Secondary | ICD-10-CM | POA: Diagnosis not present

## 2015-03-08 DIAGNOSIS — S62633A Displaced fracture of distal phalanx of left middle finger, initial encounter for closed fracture: Secondary | ICD-10-CM | POA: Diagnosis not present

## 2015-03-21 ENCOUNTER — Ambulatory Visit (INDEPENDENT_AMBULATORY_CARE_PROVIDER_SITE_OTHER): Payer: Medicare Other | Admitting: General Practice

## 2015-03-21 DIAGNOSIS — D688 Other specified coagulation defects: Secondary | ICD-10-CM

## 2015-03-21 DIAGNOSIS — Z5181 Encounter for therapeutic drug level monitoring: Secondary | ICD-10-CM | POA: Diagnosis not present

## 2015-03-21 DIAGNOSIS — K55069 Acute infarction of intestine, part and extent unspecified: Secondary | ICD-10-CM

## 2015-03-21 DIAGNOSIS — Z86718 Personal history of other venous thrombosis and embolism: Secondary | ICD-10-CM | POA: Diagnosis not present

## 2015-03-21 DIAGNOSIS — D6851 Activated protein C resistance: Secondary | ICD-10-CM

## 2015-03-21 DIAGNOSIS — I81 Portal vein thrombosis: Secondary | ICD-10-CM | POA: Diagnosis not present

## 2015-03-21 LAB — POCT INR: INR: 2.9

## 2015-03-21 NOTE — Progress Notes (Signed)
Pre visit review using our clinic review tool, if applicable. No additional management support is needed unless otherwise documented below in the visit note. 

## 2015-03-21 NOTE — Progress Notes (Signed)
I have reviewed and agree with the plan. 

## 2015-04-18 ENCOUNTER — Ambulatory Visit (INDEPENDENT_AMBULATORY_CARE_PROVIDER_SITE_OTHER): Payer: Medicare Other | Admitting: General Practice

## 2015-04-18 DIAGNOSIS — I81 Portal vein thrombosis: Secondary | ICD-10-CM

## 2015-04-18 DIAGNOSIS — Z86718 Personal history of other venous thrombosis and embolism: Secondary | ICD-10-CM | POA: Diagnosis not present

## 2015-04-18 DIAGNOSIS — K55069 Acute infarction of intestine, part and extent unspecified: Secondary | ICD-10-CM

## 2015-04-18 DIAGNOSIS — Z5181 Encounter for therapeutic drug level monitoring: Secondary | ICD-10-CM | POA: Diagnosis not present

## 2015-04-18 DIAGNOSIS — D688 Other specified coagulation defects: Secondary | ICD-10-CM | POA: Diagnosis not present

## 2015-04-18 DIAGNOSIS — D6851 Activated protein C resistance: Secondary | ICD-10-CM

## 2015-04-18 LAB — POCT INR: INR: 4.3

## 2015-04-18 NOTE — Progress Notes (Signed)
Pre visit review using our clinic review tool, if applicable. No additional management support is needed unless otherwise documented below in the visit note. 

## 2015-04-19 NOTE — Progress Notes (Signed)
I have reviewed and agree with the plan. 

## 2015-05-01 ENCOUNTER — Encounter: Payer: Self-pay | Admitting: Internal Medicine

## 2015-05-14 ENCOUNTER — Other Ambulatory Visit: Payer: Self-pay | Admitting: Emergency Medicine

## 2015-05-14 ENCOUNTER — Other Ambulatory Visit: Payer: Self-pay | Admitting: Internal Medicine

## 2015-05-14 MED ORDER — HYOSCYAMINE SULFATE 0.125 MG SL SUBL: SUBLINGUAL_TABLET | SUBLINGUAL | Status: DC

## 2015-05-16 ENCOUNTER — Ambulatory Visit (INDEPENDENT_AMBULATORY_CARE_PROVIDER_SITE_OTHER): Payer: Self-pay | Admitting: *Deleted

## 2015-05-16 DIAGNOSIS — K55069 Acute infarction of intestine, part and extent unspecified: Secondary | ICD-10-CM

## 2015-05-16 DIAGNOSIS — D688 Other specified coagulation defects: Secondary | ICD-10-CM

## 2015-05-16 DIAGNOSIS — Z23 Encounter for immunization: Secondary | ICD-10-CM

## 2015-05-16 DIAGNOSIS — D6851 Activated protein C resistance: Secondary | ICD-10-CM

## 2015-05-16 DIAGNOSIS — Z5181 Encounter for therapeutic drug level monitoring: Secondary | ICD-10-CM

## 2015-05-16 DIAGNOSIS — Z86718 Personal history of other venous thrombosis and embolism: Secondary | ICD-10-CM

## 2015-05-16 DIAGNOSIS — I81 Portal vein thrombosis: Secondary | ICD-10-CM

## 2015-05-16 LAB — POCT INR: INR: 2.2

## 2015-05-16 NOTE — Progress Notes (Signed)
Pre visit review using our clinic review tool, if applicable. No additional management support is needed unless otherwise documented below in the visit note. 

## 2015-05-16 NOTE — Progress Notes (Deleted)
   Subjective:    Patient ID: Patrick Hatfield, male    DOB: [birth year], 80 y.o.   MRN: [id number]  HPI    Review of Systems     Objective:   Physical Exam        Assessment & Plan:

## 2015-05-16 NOTE — Progress Notes (Signed)
I have reviewed and agree with the plan. 

## 2015-06-04 ENCOUNTER — Other Ambulatory Visit: Payer: Self-pay | Admitting: Emergency Medicine

## 2015-06-04 ENCOUNTER — Telehealth: Payer: Self-pay | Admitting: Emergency Medicine

## 2015-06-04 MED ORDER — GABAPENTIN 300 MG PO CAPS: ORAL_CAPSULE | ORAL | Status: DC

## 2015-06-04 NOTE — Telephone Encounter (Signed)
?   Gabapentin OK X 3 mos

## 2015-06-04 NOTE — Telephone Encounter (Signed)
Last OV 11/08/14. Last refill 12/08/14 please advise

## 2015-06-13 ENCOUNTER — Ambulatory Visit (INDEPENDENT_AMBULATORY_CARE_PROVIDER_SITE_OTHER): Payer: Medicare Other | Admitting: General Practice

## 2015-06-13 DIAGNOSIS — I81 Portal vein thrombosis: Secondary | ICD-10-CM

## 2015-06-13 DIAGNOSIS — K55069 Acute infarction of intestine, part and extent unspecified: Secondary | ICD-10-CM

## 2015-06-13 DIAGNOSIS — Z5181 Encounter for therapeutic drug level monitoring: Secondary | ICD-10-CM | POA: Diagnosis not present

## 2015-06-13 DIAGNOSIS — Z86718 Personal history of other venous thrombosis and embolism: Secondary | ICD-10-CM

## 2015-06-13 DIAGNOSIS — D6851 Activated protein C resistance: Secondary | ICD-10-CM

## 2015-06-13 LAB — POCT INR: INR: 1.8

## 2015-06-13 NOTE — Progress Notes (Signed)
Pre visit review using our clinic review tool, if applicable. No additional management support is needed unless otherwise documented below in the visit note. 

## 2015-06-14 NOTE — Progress Notes (Signed)
I have reviewed and agree with the plan. 

## 2015-06-15 ENCOUNTER — Ambulatory Visit (INDEPENDENT_AMBULATORY_CARE_PROVIDER_SITE_OTHER): Payer: Medicare Other | Admitting: Family Medicine

## 2015-06-15 ENCOUNTER — Other Ambulatory Visit: Payer: Self-pay | Admitting: Internal Medicine

## 2015-06-15 VITALS — BP 139/87 | HR 74 | Temp 97.5°F | Resp 16 | Ht 69.25 in | Wt 218.2 lb

## 2015-06-15 DIAGNOSIS — M436 Torticollis: Secondary | ICD-10-CM | POA: Diagnosis not present

## 2015-06-15 MED ORDER — CYCLOBENZAPRINE HCL 5 MG PO TABS: 2.5000 mg | ORAL_TABLET | Freq: Three times a day (TID) | ORAL | Status: DC | PRN

## 2015-06-15 NOTE — Telephone Encounter (Signed)
Please advise 

## 2015-06-15 NOTE — Patient Instructions (Signed)
Acute Torticollis °Torticollis is a condition in which the muscles of the neck tighten (contract) abnormally, causing the neck to twist and the head to move into an unnatural position. Torticollis that develops suddenly is called acute torticollis. If torticollis becomes chronic and is left untreated, the face and neck can become deformed. °CAUSES °This condition may be caused by: °· Sleeping in an awkward position (common). °· Extending or twisting the neck muscles beyond their normal position. °· Infection. °In some cases, the cause may not be known. °SYMPTOMS °Symptoms of this condition include: °· An unnatural position of the head. °· Neck pain. °· A limited ability to move the neck. °· Twisting of the neck to one side. °DIAGNOSIS °This condition is diagnosed with a physical exam. You may also have imaging tests, such as an X-ray, CT scan, or MRI. °TREATMENT °Treatment for this condition involves trying to relax the neck muscles. It may include: °· Medicines or shots. °· Physical therapy. °· Surgery. This may be done in severe cases. °HOME CARE INSTRUCTIONS °· Take medicines only as directed by your health care provider. °· Do stretching exercises and massage your neck as directed by your health care provider. °· Keep all follow-up visits as directed by your health care provider. This is important. °SEEK MEDICAL CARE IF: °· You develop a fever. °SEEK IMMEDIATE MEDICAL CARE IF: °· You develop difficulty breathing. °· You develop noisy breathing (stridor). °· You start drooling. °· You have trouble swallowing or have pain with swallowing. °· You develop numbness or weakness in your hands or feet. °· You have changes in your speech, understanding, or vision. °· Your pain gets worse. °  °This information is not intended to replace advice given to you by your health care provider. Make sure you discuss any questions you have with your health care provider. °  °Document Released: 08/01/2000 Document Revised:  12/19/2014 Document Reviewed: 07/31/2014 °Elsevier Interactive Patient Education ©2016 [employer] ° °

## 2015-06-15 NOTE — Progress Notes (Signed)
Subjective:    Patient ID: Patrick Hatfield, male    DOB: [birth year], 80 y.o.   MRN: [id number] Chief Complaint  Patient presents with  . Neck Pain  . Shoulder Pain    Left shoulder    HPI  Sev nights ago he developed progressive tightness and stiffness of his back left neck randating down into tip shoulder.  Difficult to turn his head, nki.  Was so severe that he couldn't sleep at all last night. No sig h/o similar prior. Tried aleve but is on coumadin so use is limited. Tried alt heat with ice w/o benefit.   No weakness/numbness, no rash, no f/c.  No cough/cold, no URI, has not overdone it at all.  Past Medical History  Diagnosis Date  . Proteinuria age 35  . Gilbert's syndrome   . Benign essential tremor   . Colitis in 20's  . Depression 2006    drug overdose  . Personal history of colonic polyps 05/28/2007    adenomatous  . Insomnia   . 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 Esophageal reflux   . DVT (deep venous thrombosis) ([place]) 1991 & 2010    X 2 in context Factor 5 def  . Diverticulosis   . Hiatal hernia   . Umbilical hernia   . H/O ischemic bowel disease 2010    while off warfarin   . Factor 5 Leiden mutation, heterozygous ([place])   . Herpes zoster 2010    facial  . 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 Colon polyps   . Barrett's esophagus   . Anxiety   . Arthritis     KNEES,ANKLES,HANDS  . Asthma     AS A TEENAGER  . Clotting disorder ([place])     FACTOR 5  . Hyperlipidemia   . Hypertension   . Osteoporosis   . Hx of adenomatous colonic polyps 12/14/2014   Current Outpatient Prescriptions on File Prior to Visit  Medication Sig Dispense Refill  . cholecalciferol (VITAMIN D) 1000 UNITS tablet Take 1,000 Units by mouth daily.      . diphenhydramine-acetaminophen (TYLENOL PM) 25-500 MG TABS Take 1 tablet by mouth at bedtime as needed.    . folic acid (FOLVITE) 660 MCG tablet Take 400 mcg by mouth daily.      Marland Kitchen  gabapentin (NEURONTIN) 300 MG capsule TAKE ONE CAPSULE BY MOUTH EVERY 8 HOURS 270 capsule 0  . hyoscyamine (LEVSIN SL) 0.125 MG SL tablet PLACE 1 TABLET UNDER THE TONGUE EVERY 4 HOURS AS NEEDED FOR CRAMPING. 30 tablet 0  . losartan-hydrochlorothiazide (HYZAAR) 100-12.5 MG per tablet TAKE 1 TABLET EVERY DAY 90 tablet 1  . Omega-3 Fatty Acids (FISH OIL) 1200 MG CAPS Take 1,200 mg by mouth 2 (two) times daily.      . pantoprazole (PROTONIX) 40 MG tablet Take 1 tablet (40 mg total) by mouth daily before breakfast. 90 tablet 3  . propranolol (INDERAL) 40 MG tablet TAKE 1 TABLET BY MOUTH TWICE A DAY 180 tablet 1  . warfarin (COUMADIN) 5 MG tablet TAKE AS DIRECTED BY ANTICOAGULATION CLINIC 90 tablet 1  . enoxaparin (LOVENOX) 150 MG/ML injection Inject 1 mL (150 mg total) into the skin daily. 7 Syringe 0  . ranitidine (ZANTAC) 150 MG tablet Take 150 mg by mouth as needed.     No current facility-administered medications on file prior to visit.   Allergies  Allergen Reactions  .  Amlodipine Besy-Benazepril Hcl     REACTION: swelling of feet.  Probably  from Amlodipine component)  . Lipitor [Atorvastatin Calcium]     ? Reaction, ? swelling     Review of Systems  Constitutional: Positive for fatigue. Negative for fever, chills, diaphoresis, activity change, appetite change and unexpected weight change.  HENT: Negative for congestion, nosebleeds, postnasal drip, rhinorrhea, sinus pressure, sore throat and trouble swallowing.   Respiratory: Negative for cough.   Gastrointestinal: Negative for vomiting, abdominal pain, blood in stool and anal bleeding.  Genitourinary: Positive for frequency.  Musculoskeletal: Positive for myalgias, back pain, arthralgias, neck pain and neck stiffness. Negative for joint swelling and gait problem.  Skin: Negative for rash.  Neurological: Negative for weakness and numbness.  Hematological: Bruises/bleeds easily.  Psychiatric/Behavioral: Positive for sleep disturbance.         Objective:  BP 139/87 mmHg  Pulse 74  Temp(Src) 97.5 F (36.4 C) (Oral)  Resp 16  Ht 5' 9.25" (1.759 m)  Wt 218 lb 3.2 oz (98.975 kg)  BMI 31.99 kg/m2  SpO2 97%  Physical Exam  Constitutional: He is oriented to person, place, and time. He appears well-developed and well-nourished. No distress.  HENT:  Head: Normocephalic and atraumatic.  Eyes: No scleral icterus.  Pulmonary/Chest: Effort normal.  Musculoskeletal:       Cervical back: He exhibits decreased range of motion, tenderness, pain and spasm. He exhibits no bony tenderness, no swelling, no deformity, no laceration and normal pulse.  Neurological: He is alert and oriented to person, place, and time. He has normal strength. He displays no atrophy. No cranial nerve deficit or sensory deficit. He exhibits normal muscle tone. Coordination and gait normal.  Reflex Scores:      Tricep reflexes are 2+ on the right side and 2+ on the left side.      Bicep reflexes are 2+ on the right side and 2+ on the left side.      Brachioradialis reflexes are 2+ on the right side and 2+ on the left side. Skin: Skin is warm and dry. He is not diaphoretic.  Psychiatric: He has a normal mood and affect. His behavior is normal.          Assessment & Plan:   1. Torticollis, acute     Meds ordered this encounter  Medications  . cyclobenzaprine (FLEXERIL) 5 MG tablet    Sig: Take 0.5-2 tablets (2.5-10 mg total) by mouth 3 (three) times daily as needed for muscle spasms.    Dispense:  30 tablet    Refill:  0    I personally performed the services described in this documentation, which was scribed in my presence. The recorded information has been reviewed and considered, and addended by me as needed.  Delman Cheadle, MD MPH

## 2015-06-16 ENCOUNTER — Ambulatory Visit (INDEPENDENT_AMBULATORY_CARE_PROVIDER_SITE_OTHER): Payer: Medicare Other | Admitting: Family Medicine

## 2015-06-16 ENCOUNTER — Ambulatory Visit (INDEPENDENT_AMBULATORY_CARE_PROVIDER_SITE_OTHER): Payer: Medicare Other

## 2015-06-16 VITALS — BP 168/90 | HR 87 | Temp 98.5°F | Resp 16 | Ht 69.0 in | Wt 218.0 lb

## 2015-06-16 DIAGNOSIS — M542 Cervicalgia: Secondary | ICD-10-CM | POA: Diagnosis not present

## 2015-06-16 DIAGNOSIS — M436 Torticollis: Secondary | ICD-10-CM | POA: Diagnosis not present

## 2015-06-16 MED ORDER — KETOROLAC TROMETHAMINE 60 MG/2ML IM SOLN
60.0000 mg | Freq: Once | INTRAMUSCULAR | Status: AC
  Administered 2015-06-16: 60 mg via INTRAMUSCULAR

## 2015-06-16 MED ORDER — PREDNISONE 10 MG PO TABS: ORAL_TABLET | ORAL | Status: DC

## 2015-06-16 MED ORDER — DIAZEPAM 5 MG PO TABS: 5.0000 mg | ORAL_TABLET | Freq: Four times a day (QID) | ORAL | Status: DC | PRN

## 2015-06-16 NOTE — Progress Notes (Addendum)
Subjective:    Patient ID: Patrick Hatfield, male    DOB: [birth year], 80 y.o.   MRN: [id number] This chart was scribed for Delman Cheadle, MD by Marti Sleigh, Medical Scribe. This patient was seen in Room 1 and the patient's care was started a 12:15 PM.  Chief Complaint  Patient presents with  . Neck Pain    x 3 days     HPI HPI Comments: Patrick Hatfield is a 80 y.o. male who presents to [facility] complaining of continued neck pain with limited ROM. He states he was not able to sleep last night due to the pain. He states he took one flexeril after picking up his prescription and two flexeril at bedtime but was still not able to sleep due to the severity of his pain. He took two again this morning without any relief. He denies numbness or tingling, HA, dizziness, instability, visual disturbance.   I saw the pt yesterday for suspected muscle spasm in left trapezius. He is on coumadin so cannot take NSAIDs. I recommended he start on flexeril and use heat at the site of spasm. Last INR three days ago was sub therapeutic at 1.8. Last CNP was nine moths ago but normal kidney function and no prior gulose intolerance.    Past Medical History  Diagnosis Date  . Proteinuria age 54  . Gilbert's syndrome   . Benign essential tremor   . Colitis in 20's  . Depression 2006    drug overdose  . Personal history of colonic polyps 05/28/2007    adenomatous  . Insomnia   . 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 Esophageal reflux   . DVT (deep venous thrombosis) ([place]) 1991 & 2010    X 2 in context Factor 5 def  . Diverticulosis   . Hiatal hernia   . Umbilical hernia   . H/O ischemic bowel disease 2010    while off warfarin   . Factor 5 Leiden mutation, heterozygous ([place])   . Herpes zoster 2010    facial  . 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 Colon polyps   . Barrett's esophagus   . Anxiety   . Arthritis     KNEES,ANKLES,HANDS  . Asthma     AS A TEENAGER    . Clotting disorder ([place])     FACTOR 5  . Hyperlipidemia   . Hypertension   . Osteoporosis   . Hx of adenomatous colonic polyps 12/14/2014   Allergies  Allergen Reactions  . Amlodipine Besy-Benazepril Hcl     REACTION: swelling of feet.  Probably  from Amlodipine component)  . Lipitor [Atorvastatin Calcium]     ? Reaction, ? swelling   Current Outpatient Prescriptions on File Prior to Visit  Medication Sig Dispense Refill  . cholecalciferol (VITAMIN D) 1000 UNITS tablet Take 1,000 Units by mouth daily.      . cyclobenzaprine (FLEXERIL) 5 MG tablet Take 0.5-2 tablets (2.5-10 mg total) by mouth 3 (three) times daily as needed for muscle spasms. 30 tablet 0  . diphenhydramine-acetaminophen (TYLENOL PM) 25-500 MG TABS Take 1 tablet by mouth at bedtime as needed.    . enoxaparin (LOVENOX) 150 MG/ML injection Inject 1 mL (150 mg total) into the skin daily. 7 Syringe 0  . folic acid (FOLVITE) 585 MCG tablet Take 400 mcg by mouth daily.      Marland Kitchen gabapentin (NEURONTIN) 300 MG capsule TAKE ONE CAPSULE  BY MOUTH EVERY 8 HOURS 270 capsule 0  . hyoscyamine (LEVSIN SL) 0.125 MG SL tablet PLACE 1 TABLET UNDER THE TONGUE EVERY 4 HOURS AS NEEDED FOR CRAMPING. 30 tablet 0  . losartan-hydrochlorothiazide (HYZAAR) 100-12.5 MG per tablet TAKE 1 TABLET EVERY DAY 90 tablet 1  . Omega-3 Fatty Acids (FISH OIL) 1200 MG CAPS Take 1,200 mg by mouth 2 (two) times daily.      . pantoprazole (PROTONIX) 40 MG tablet Take 1 tablet (40 mg total) by mouth daily before breakfast. 90 tablet 3  . propranolol (INDERAL) 40 MG tablet TAKE 1 TABLET BY MOUTH TWICE A DAY 180 tablet 1  . warfarin (COUMADIN) 5 MG tablet TAKE AS DIRECTED BY ANTICOAGULATION CLINIC 90 tablet 1  . ranitidine (ZANTAC) 150 MG tablet Take 150 mg by mouth as needed.     No current facility-administered medications on file prior to visit.     Review of Systems  Constitutional: Positive for activity change and fatigue. Negative for fever, chills,  diaphoresis, appetite change and unexpected weight change.  Cardiovascular: Negative for chest pain.  Musculoskeletal: Positive for myalgias, arthralgias, neck pain and neck stiffness. Negative for back pain, joint swelling and gait problem.  Skin: Negative for rash.  Neurological: Negative for dizziness, tremors, syncope, facial asymmetry, speech difficulty, weakness, light-headedness, numbness and headaches.  Hematological: Negative for adenopathy.  Psychiatric/Behavioral: Positive for sleep disturbance.      Objective:  BP 168/90 mmHg  Pulse 87  Temp(Src) 98.5 F (36.9 C) (Oral)  Resp 16  Ht 5\' 9"  (1.753 m)  Wt 218 lb (98.884 kg)  BMI 32.18 kg/m2  SpO2 97%  Physical Exam  Constitutional: He is oriented to person, place, and time. He appears well-developed and well-nourished. No distress.  HENT:  Head: Normocephalic and atraumatic.  Eyes: Pupils are equal, round, and reactive to light.  Neck: Neck supple.  Cardiovascular: Normal rate.   Pulmonary/Chest: Effort normal. No respiratory distress.  Musculoskeletal: Normal range of motion.  5/5 strength on grasp, opposition, and extension. severely limited ROM in neck.  Neurological: He is alert and oriented to person, place, and time. Coordination normal.  Skin: Skin is warm and dry. He is not diaphoretic.  Psychiatric: He has a normal mood and affect. His behavior is normal.  Nursing note and vitals reviewed.    [facility] reading (PRIMARY) by  Dr. Brigitte Pulse. C-spine: anterospondylolisthesis of C3 on C4, severe DDD of c5-6, c6-7  Dg Cervical Spine 2 Or 3 Views  06/16/2015  CLINICAL DATA:  Neck pain for 2 days EXAM: CERVICAL SPINE - 2-3 VIEW COMPARISON:  None. FINDINGS: No prevertebral soft tissue swelling. Mild anterolisthesis of C3 on C4 by a 2 mm. Normal alignment of vertebral bodies. Lucency along the inferior anterior corner of the C4 vertebral body. Open mouth odontoid view is normal. IMPRESSION: No acute findings of the cervical  spine. Mild degenerative change potential remote fracture the anterior inferior endplate of C4. Electronically Signed   By: Suzy Bouchard M.D.   On: 06/16/2015 13:35    Assessment & Plan:   1. Cervicalgia   2. Acute torticollis   Placed in soft c-spine collar for comfort - ok to remove at night as long as supportive neck alignment in bed. Ok to remove for gradually increasing amounts of time as long as pain is improving. Cont heat and very gentle stretching. Suspect flair of OA and muscle spasm. Flexeril provided no relief so will try pred course - kidneys and prior a1c nml. -  try valium qhs for muscle relaxant. RTC if pain persists  Orders Placed This Encounter  Procedures  . DG Cervical Spine 2 or 3 views    Standing Status: Future     Number of Occurrences: 1     Standing Expiration Date: 06/15/2016    Order Specific Question:  Reason for Exam (SYMPTOM  OR DIAGNOSIS REQUIRED)    Answer:  worsening pain and severely decreased range of motion    Order Specific Question:  Preferred imaging location?    Answer:  External    Meds ordered this encounter  Medications  . ketorolac (TORADOL) injection 60 mg    Sig:   . predniSONE (DELTASONE) 10 MG tablet    Sig: 6-5-4-3-2-1 tabs po qd    Dispense:  21 tablet    Refill:  0  . diazepam (VALIUM) 5 MG tablet    Sig: Take 1 tablet (5 mg total) by mouth every 6 (six) hours as needed for muscle spasms.    Dispense:  30 tablet    Refill:  0    I personally performed the services described in this documentation, which was scribed in my presence. The recorded information has been reviewed and considered, and addended by me as needed.  Delman Cheadle, MD MPH   By signing my name below, I, Judithe Modest, attest that this documentation has been prepared under the direction and in the presence of Delman Cheadle, MD. Electronically Signed: Judithe Modest, ER Scribe. 06/16/2015. 12:15 PM.

## 2015-06-16 NOTE — Patient Instructions (Addendum)
Acute Torticollis °Torticollis is a condition in which the muscles of the neck tighten (contract) abnormally, causing the neck to twist and the head to move into an unnatural position. Torticollis that develops suddenly is called acute torticollis. If torticollis becomes chronic and is left untreated, the face and neck can become deformed. °CAUSES °This condition may be caused by: °· Sleeping in an awkward position (common). °· Extending or twisting the neck muscles beyond their normal position. °· Infection. °In some cases, the cause may not be known. °SYMPTOMS °Symptoms of this condition include: °· An unnatural position of the head. °· Neck pain. °· A limited ability to move the neck. °· Twisting of the neck to one side. °DIAGNOSIS °This condition is diagnosed with a physical exam. You may also have imaging tests, such as an X-ray, CT scan, or MRI. °TREATMENT °Treatment for this condition involves trying to relax the neck muscles. It may include: °· Medicines or shots. °· Physical therapy. °· Surgery. This may be done in severe cases. °HOME CARE INSTRUCTIONS °· Take medicines only as directed by your health care provider. °· Do stretching exercises and massage your neck as directed by your health care provider. °· Keep all follow-up visits as directed by your health care provider. This is important. °SEEK MEDICAL CARE IF: °· You develop a fever. °SEEK IMMEDIATE MEDICAL CARE IF: °· You develop difficulty breathing. °· You develop noisy breathing (stridor). °· You start drooling. °· You have trouble swallowing or have pain with swallowing. °· You develop numbness or weakness in your hands or feet. °· You have changes in your speech, understanding, or vision. °· Your pain gets worse. °  °This information is not intended to replace advice given to you by your health care provider. Make sure you discuss any questions you have with your health care provider. °  °Document Released: 08/01/2000 Document Revised:  12/19/2014 Document Reviewed: 07/31/2014 °Elsevier Interactive Patient Education ©2016 [employer]vier Inc. ° °

## 2015-06-26 ENCOUNTER — Other Ambulatory Visit: Payer: Self-pay | Admitting: Family Medicine

## 2015-07-11 ENCOUNTER — Ambulatory Visit (INDEPENDENT_AMBULATORY_CARE_PROVIDER_SITE_OTHER): Payer: Medicare Other | Admitting: General Practice

## 2015-07-11 DIAGNOSIS — D6851 Activated protein C resistance: Secondary | ICD-10-CM | POA: Diagnosis not present

## 2015-07-11 DIAGNOSIS — Z5181 Encounter for therapeutic drug level monitoring: Secondary | ICD-10-CM

## 2015-07-11 DIAGNOSIS — I81 Portal vein thrombosis: Secondary | ICD-10-CM | POA: Diagnosis not present

## 2015-07-11 DIAGNOSIS — Z86718 Personal history of other venous thrombosis and embolism: Secondary | ICD-10-CM | POA: Diagnosis not present

## 2015-07-11 DIAGNOSIS — K55069 Acute infarction of intestine, part and extent unspecified: Secondary | ICD-10-CM

## 2015-07-11 LAB — POCT INR: INR: 2.4

## 2015-07-11 NOTE — Progress Notes (Signed)
I have reviewed and agree with the plan. 

## 2015-07-11 NOTE — Progress Notes (Signed)
Pre visit review using our clinic review tool, if applicable. No additional management support is needed unless otherwise documented below in the visit note. 

## 2015-07-30 DIAGNOSIS — M1712 Unilateral primary osteoarthritis, left knee: Secondary | ICD-10-CM | POA: Diagnosis not present

## 2015-08-08 ENCOUNTER — Ambulatory Visit (INDEPENDENT_AMBULATORY_CARE_PROVIDER_SITE_OTHER): Payer: Medicare Other | Admitting: General Practice

## 2015-08-08 DIAGNOSIS — I81 Portal vein thrombosis: Secondary | ICD-10-CM | POA: Diagnosis not present

## 2015-08-08 DIAGNOSIS — D6851 Activated protein C resistance: Secondary | ICD-10-CM | POA: Diagnosis not present

## 2015-08-08 DIAGNOSIS — K55069 Acute infarction of intestine, part and extent unspecified: Secondary | ICD-10-CM

## 2015-08-08 DIAGNOSIS — Z86718 Personal history of other venous thrombosis and embolism: Secondary | ICD-10-CM | POA: Diagnosis not present

## 2015-08-08 DIAGNOSIS — Z5181 Encounter for therapeutic drug level monitoring: Secondary | ICD-10-CM | POA: Diagnosis not present

## 2015-08-08 LAB — POCT INR: INR: 2

## 2015-08-08 NOTE — Progress Notes (Signed)
I have reviewed and agree with the plan. 

## 2015-08-08 NOTE — Progress Notes (Signed)
Pre visit review using our clinic review tool, if applicable. No additional management support is needed unless otherwise documented below in the visit note. 

## 2015-08-24 ENCOUNTER — Other Ambulatory Visit: Payer: Self-pay | Admitting: Internal Medicine

## 2015-09-05 ENCOUNTER — Ambulatory Visit (INDEPENDENT_AMBULATORY_CARE_PROVIDER_SITE_OTHER): Payer: Medicare Other | Admitting: General Practice

## 2015-09-05 ENCOUNTER — Ambulatory Visit (INDEPENDENT_AMBULATORY_CARE_PROVIDER_SITE_OTHER): Payer: Medicare Other | Admitting: Internal Medicine

## 2015-09-05 ENCOUNTER — Encounter: Payer: Self-pay | Admitting: Internal Medicine

## 2015-09-05 VITALS — BP 120/68 | HR 82 | Temp 98.5°F | Resp 16 | Ht 70.0 in | Wt 216.0 lb

## 2015-09-05 DIAGNOSIS — G4700 Insomnia, unspecified: Secondary | ICD-10-CM | POA: Insufficient documentation

## 2015-09-05 DIAGNOSIS — R197 Diarrhea, unspecified: Secondary | ICD-10-CM | POA: Insufficient documentation

## 2015-09-05 DIAGNOSIS — I81 Portal vein thrombosis: Secondary | ICD-10-CM

## 2015-09-05 DIAGNOSIS — G250 Essential tremor: Secondary | ICD-10-CM

## 2015-09-05 DIAGNOSIS — Z86718 Personal history of other venous thrombosis and embolism: Secondary | ICD-10-CM

## 2015-09-05 DIAGNOSIS — K55069 Acute infarction of intestine, part and extent unspecified: Secondary | ICD-10-CM

## 2015-09-05 DIAGNOSIS — D6851 Activated protein C resistance: Secondary | ICD-10-CM

## 2015-09-05 DIAGNOSIS — Z23 Encounter for immunization: Secondary | ICD-10-CM

## 2015-09-05 DIAGNOSIS — I10 Essential (primary) hypertension: Secondary | ICD-10-CM

## 2015-09-05 DIAGNOSIS — K219 Gastro-esophageal reflux disease without esophagitis: Secondary | ICD-10-CM

## 2015-09-05 DIAGNOSIS — Z5181 Encounter for therapeutic drug level monitoring: Secondary | ICD-10-CM

## 2015-09-05 LAB — POCT INR: INR: 2.3

## 2015-09-05 MED ORDER — LOSARTAN POTASSIUM-HCTZ 100-12.5 MG PO TABS: 1.0000 | ORAL_TABLET | Freq: Every day | ORAL | Status: DC

## 2015-09-05 MED ORDER — PROPRANOLOL HCL 40 MG PO TABS: 40.0000 mg | ORAL_TABLET | Freq: Two times a day (BID) | ORAL | Status: DC

## 2015-09-05 MED ORDER — GABAPENTIN 300 MG PO CAPS
ORAL_CAPSULE | ORAL | Status: DC
Start: 2015-09-05 — End: 2016-03-04

## 2015-09-05 NOTE — Assessment & Plan Note (Signed)
History of DVT, thrombosis of mesenteric vein Long-term anticoagulation with warfarin

## 2015-09-05 NOTE — Progress Notes (Signed)
Subjective:    Patient ID: Patrick Hatfield, male    DOB: [birth year], 81 y.o.   MRN: [id number]  HPI He is here to establish with a new pcp.    He has been having urgency with bowel movements and more frequent bowel movements for years. His symptoms have gotten worse recently. He has 4-5 bowel movements a day.  His stool is loose, without blood.  He denies abdominal pain, just discomfort.  No nausea.  His GERD is controlled.   He has a history of colitis.  His symptoms worse after starting the probitocs.  He did stop the probiotics for a couple of days and there was a difference, but did not want to stop this without consulting me. He did go back on them. He denies any relation to the frequent bowel movements and loose stools to when he eats for the type of foods that he eats.  he again states that he has always had these symtpoms worse for the past 6-9 months. He has not had any antibiotics for a long time.    insomnia: He was taking Tylenol PM in the past, but stopped taking them. He takes gabapentin sometimes for sleep and it does help. He was originally prescribed this for sleep years ago. He sometimes takes 2 pills at night and he finds that it is effective. He has also noticed that when he was taking hyoscyamine was also helping with sleep, but would not last. He was unsure if this is something continue.      History of DVT/PE, factor V Leiden mutation: He is on long-term anticoagulation. He follows with cindy for monitoring.   Essential tremor: He takes propranolol daily and feels this works well.  GERD: He has been taking the Protonix for several months. Prior to that he was taking Prilosec and it was not effective. He is unsure if this medication is connected with the worsening bowel habits.   Medications and allergies reviewed with patient and updated if appropriate.  Patient Active Problem List   Diagnosis Date Noted  . Hx of adenomatous colonic polyps 12/14/2014  . Hematuria,  gross 02/03/2014  . 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 Thrombocytopenia, unspecified ([place]) 01/05/2013  . Gilbert syndrome 01/05/2013  . Unspecified adverse effect of unspecified drug, medicinal and biological substance 01/09/2012  . Barrett's esophagus 03/24/2011  . Factor 5 Leiden mutation, heterozygous ([place]) 03/21/2011  . TREMOR, ESSENTIAL 04/24/2009  . COAGULOPATHY 04/16/2009  . HYPERLIPIDEMIA 10/03/2008  . HYPERTENSION 10/03/2008  . GERD 10/03/2008  . Fasting hyperglycemia 10/03/2008  . DVT, HX OF 10/03/2008    Current Outpatient Prescriptions on File Prior to Visit  Medication Sig Dispense Refill  . cholecalciferol (VITAMIN D) 1000 UNITS tablet Take 1,000 Units by mouth daily.      . diphenhydramine-acetaminophen (TYLENOL PM) 25-500 MG TABS Take 1 tablet by mouth at bedtime as needed.    . enoxaparin (LOVENOX) 150 MG/ML injection Inject 1 mL (150 mg total) into the skin daily. (Patient taking differently: Inject 150 mg into the skin as needed. ) 7 Syringe 0  . folic acid (FOLVITE) A999333 MCG tablet Take 400 mcg by mouth daily.      Marland Kitchen gabapentin (NEURONTIN) 300 MG capsule TAKE ONE CAPSULE BY MOUTH EVERY 8 HOURS 270 capsule 0  . hyoscyamine (LEVSIN SL) 0.125 MG SL tablet Take 1 tablet (0.125 mg total) by mouth every 4 (four) hours as needed for cramping. Must keep 09/05/15 appt for future refills  10 tablet 0  . losartan-hydrochlorothiazide (HYZAAR) 100-12.5 MG tablet Take 1 tablet by mouth daily. Must keep 09/05/15 appt for future refills 90 tablet 0  . Omega-3 Fatty Acids (FISH OIL) 1200 MG CAPS Take 1,200 mg by mouth 2 (two) times daily.      . pantoprazole (PROTONIX) 40 MG tablet Take 1 tablet (40 mg total) by mouth daily before breakfast. 90 tablet 3  . propranolol (INDERAL) 40 MG tablet TAKE 1 TABLET BY MOUTH TWICE A DAY 180 tablet 0  . warfarin (COUMADIN) 5 MG tablet TAKE AS DIRECTED BY ANTICOAGULATION CLINIC 90 tablet 1   No current facility-administered medications on  file prior to visit.    Past Medical History  Diagnosis Date  . Proteinuria age 21  . Gilbert's syndrome   . Benign essential tremor   . Colitis in 20's  . Depression 2006    drug overdose  . Personal history of colonic polyps 05/28/2007    adenomatous  . Insomnia   . 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 Esophageal reflux   . DVT (deep venous thrombosis) ([place]) 1991 & 2010    X 2 in context Factor 5 def  . Diverticulosis   . Hiatal hernia   . Umbilical hernia   . H/O ischemic bowel disease 2010    while off warfarin   . Factor 5 Leiden mutation, heterozygous ([place])   . Herpes zoster 2010    facial  . 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 Colon polyps   . Barrett's esophagus   . Anxiety   . Arthritis     KNEES,ANKLES,HANDS  . Asthma     AS A TEENAGER  . Clotting disorder ([place])     FACTOR 5  . Hyperlipidemia   . Hypertension   . Osteoporosis   . Hx of adenomatous colonic polyps 12/14/2014    Past Surgical History  Procedure Laterality Date  . Knee arthroscopy Bilateral 2008, 2012    Dr.Aplington bilateral  . Tonsillectomy    . Transurethral resection of prostate       BPH;Dr Dahlstedt  . Cystoscopy      Neg  . Cholecystectomy  05/16/2011     Dr Marlou Starks  . Colonoscopy      Dr Sharlett Iles    Social History   Social History  . Marital Status: Married    Spouse Name: N/A  . Number of Children: 4  . Years of Education: N/A   Occupational History  . Retired    Social History Main Topics  . Smoking status: Former Smoker    Types: Cigarettes    Quit date: 08/18/1994  . Smokeless tobacco: Never Used     Comment: smoked 1956-1996, up to 2 ppd  . Alcohol Use: 1.2 oz/week    2 Cans of beer per week     Comment: 2-3 beers/week.  . Drug Use: No  . Sexual Activity: Not Asked   Other Topics Concern  . None   Social History Narrative    Family History  Problem Relation Age of Onset  . Pancreatic cancer Father 75    . Ulcers Father   . Stroke Father 57  . Diabetes Father   . Coronary artery disease Mother   . Osteoporosis Mother   . HIV Brother   . Multiple sclerosis Sister   . Heart attack Paternal Grandfather     early 28s  . Heart attack  Paternal Uncle     2 uncles in early 78s  . Aortic aneurysm Brother   . Colon cancer Neg Hx   . Esophageal cancer Neg Hx   . Stomach cancer Neg Hx     Review of Systems  Constitutional: Negative for fever and chills.  Respiratory: Negative for cough, shortness of breath and wheezing.   Cardiovascular: Negative for chest pain, palpitations and leg swelling.  Gastrointestinal: Positive for abdominal pain. Negative for nausea and blood in stool.       Gerd controlled. Loose stools  Neurological: Positive for headaches. Negative for dizziness and light-headedness.       Objective:   Filed Vitals:   09/05/15 1504  BP: 120/68  Pulse: 82  Temp: 98.5 F (36.9 C)  Resp: 16   Filed Weights   09/05/15 1504  Weight: 216 lb (97.977 kg)   Body mass index is 30.99 kg/(m^2).   Physical Exam Constitutional: Appears well-developed and well-nourished. No distress.  Neck: Neck supple. No tracheal deviation present. No thyromegaly present.  No carotid bruit. No cervical adenopathy.   Cardiovascular: Normal rate, regular rhythm and normal heart sounds.   No murmur heard.  No edema Pulmonary/Chest: Effort normal and breath sounds normal. No respiratory distress. No wheezes.  Abdomen; soft, nontender       Assessment & Plan:    T problem list for assessment and plan of chronic medical conditions

## 2015-09-05 NOTE — Assessment & Plan Note (Signed)
Currently controlled with pantoprazole, but I wonder if this is increasing his bowel movements After discontinuing probiotics he will consider decreasing or stopping pantoprazole briefly to see if his symptoms/increased bowel movements improve If there is no improvement he will start the medication and taking daily. If there is improvement we may need a different PPI

## 2015-09-05 NOTE — Assessment & Plan Note (Signed)
We'll continue gabapentin at night

## 2015-09-05 NOTE — Assessment & Plan Note (Signed)
Blood pressure well-controlled here today Continue current medication

## 2015-09-05 NOTE — Progress Notes (Signed)
Pre visit review using our clinic review tool, if applicable. No additional management support is needed unless otherwise documented below in the visit note. 

## 2015-09-05 NOTE — Assessment & Plan Note (Signed)
Controlled with propranolol Continue current dose 

## 2015-09-05 NOTE — Assessment & Plan Note (Signed)
This is a chronic problem, but has gotten worse over the past several months Seems to be worse with probiotics-advised him to stop these If he is still having increased symptoms compared to his baseline he will try stopping pantoprazole for a brief period of time-if no improvement he will resume medication daily, but if there isn't improvement may need a different PPI Up-to-date with colonoscopy

## 2015-09-05 NOTE — Progress Notes (Signed)
I have reviewed and agree with the plan. 

## 2015-09-05 NOTE — Patient Instructions (Addendum)
  We have reviewed your prior records including labs and tests today.  All other Health Maintenance issues reviewed.   All recommended immunizations and age-appropriate screenings are up-to-date.  Prevnar vaccine administered today.   Medications reviewed and updated.  Stop the probiotics.    Your prescription(s) have been submitted to your pharmacy. Please take as directed and contact our office if you believe you are having problem(s) with the medication(s).  Please schedule followup in 6 months

## 2015-09-24 ENCOUNTER — Other Ambulatory Visit: Payer: Self-pay | Admitting: Internal Medicine

## 2015-10-03 ENCOUNTER — Ambulatory Visit (INDEPENDENT_AMBULATORY_CARE_PROVIDER_SITE_OTHER): Payer: Medicare Other | Admitting: General Practice

## 2015-10-03 DIAGNOSIS — D6851 Activated protein C resistance: Secondary | ICD-10-CM

## 2015-10-03 DIAGNOSIS — K55069 Acute infarction of intestine, part and extent unspecified: Secondary | ICD-10-CM

## 2015-10-03 DIAGNOSIS — I81 Portal vein thrombosis: Secondary | ICD-10-CM

## 2015-10-03 DIAGNOSIS — Z86718 Personal history of other venous thrombosis and embolism: Secondary | ICD-10-CM

## 2015-10-03 DIAGNOSIS — Z5181 Encounter for therapeutic drug level monitoring: Secondary | ICD-10-CM | POA: Diagnosis not present

## 2015-10-03 LAB — POCT INR: INR: 2.1

## 2015-10-03 NOTE — Progress Notes (Signed)
Pre visit review using our clinic review tool, if applicable. No additional management support is needed unless otherwise documented below in the visit note. 

## 2015-10-03 NOTE — Progress Notes (Signed)
I have reviewed and agree with the plan. 

## 2015-10-09 ENCOUNTER — Encounter: Payer: Self-pay | Admitting: Oncology

## 2015-10-09 ENCOUNTER — Ambulatory Visit (INDEPENDENT_AMBULATORY_CARE_PROVIDER_SITE_OTHER): Payer: Medicare Other | Admitting: Oncology

## 2015-10-09 VITALS — BP 126/73 | HR 66 | Temp 97.8°F | Ht 70.0 in | Wt 217.7 lb

## 2015-10-09 DIAGNOSIS — D473 Essential (hemorrhagic) thrombocythemia: Secondary | ICD-10-CM

## 2015-10-09 DIAGNOSIS — D688 Other specified coagulation defects: Secondary | ICD-10-CM | POA: Diagnosis not present

## 2015-10-09 DIAGNOSIS — Z7901 Long term (current) use of anticoagulants: Secondary | ICD-10-CM

## 2015-10-09 DIAGNOSIS — D6851 Activated protein C resistance: Secondary | ICD-10-CM | POA: Diagnosis not present

## 2015-10-09 DIAGNOSIS — I81 Portal vein thrombosis: Secondary | ICD-10-CM

## 2015-10-09 DIAGNOSIS — K55069 Acute infarction of intestine, part and extent unspecified: Secondary | ICD-10-CM

## 2015-10-09 NOTE — Patient Instructions (Signed)
Return visit 1 year Lab by primary Care MD

## 2015-10-09 NOTE — Progress Notes (Signed)
Patient ID: [place], male   DOB: May 28, 1935, 81 y.o.   MRN: [id number] Hematology and Oncology Follow Up Visit  [place] [id number] July 12, 1935 81 y.o. 10/09/2015 2:11 PM   Principle Diagnosis: Encounter Diagnoses  Name Primary?  . Thrombosis of mesenteric vein Yes  . Factor 5 Leiden mutation, heterozygous [facility])   Clinical Summary: Mr. [place] will turn 81 years old this week.  I initially evaluated him in this office in August 2010 subsequent to a hospital consultation to evaluate recurrent thrombosis and mild thrombocytopenia. He presented with acute superior mesenteric vein thrombosis at that time with a previous history of a lower extremity DVT. He was found to be a heterozygote for the Factor V Leiden gene mutation. He has mild thrombocytopenia, never less than 100,000, likely due to chronic alcohol use in the past.  I recommended long-term anticoagulation. He has done well and has had no subsequent thrombotic events. He remains on long-term Coumadin anticoagulation with very stable INRs. Most recent 2.1 on 10/03/15.  Interim History:   He continues to do well. No interim medical problems. He denies any headache, change in vision, dyspnea, chest pain, palpitations, abdominal pain, hematochezia or melena, or hematuria. He is active and has no constitutional symptoms. He takes when necessary gabapentin to help him sleep. His family is doing well. One of his daughters is a Theatre manager.  Medications:   Current outpatient prescriptions:  .  cholecalciferol (VITAMIN D) 1000 UNITS tablet, Take 1,000 Units by mouth daily.  , Disp: , Rfl:  .  enoxaparin (LOVENOX) 150 MG/ML injection, Inject 1 mL (150 mg total) into the skin daily. (Patient taking differently: Inject 150 mg into the skin as needed. ), Disp: 7 Syringe, Rfl: 0 .  Fluticasone Propionate (FLONASE NA), Place into the nose as needed., Disp: , Rfl:  .  folic acid (FOLVITE) A999333 MCG tablet, Take 400 mcg by mouth daily.   , Disp: , Rfl:  .  gabapentin (NEURONTIN) 300 MG capsule, Take one cap in morning and two caps at bedtime, Disp: 270 capsule, Rfl: 1 .  gabapentin (NEURONTIN) 300 MG capsule, TAKE ONE CAPSULE BY MOUTH EVERY 8 HOURS, Disp: 270 capsule, Rfl: 0 .  losartan-hydrochlorothiazide (HYZAAR) 100-12.5 MG tablet, Take 1 tablet by mouth daily. Must keep 09/05/15 appt for future refills, Disp: 90 tablet, Rfl: 3 .  Omega-3 Fatty Acids (FISH OIL) 1200 MG CAPS, Take 1,200 mg by mouth 2 (two) times daily.  , Disp: , Rfl:  .  pantoprazole (PROTONIX) 40 MG tablet, Take 1 tablet (40 mg total) by mouth daily before breakfast., Disp: 90 tablet, Rfl: 3 .  Probiotic Product (PROBIOTIC DAILY PO), Take by mouth daily., Disp: , Rfl:  .  propranolol (INDERAL) 40 MG tablet, Take 1 tablet (40 mg total) by mouth 2 (two) times daily., Disp: 180 tablet, Rfl: 3 .  warfarin (COUMADIN) 5 MG tablet, TAKE AS DIRECTED BY ANTICOAGULATION CLINIC, Disp: 90 tablet, Rfl: 1  Allergies:  Allergies  Allergen Reactions  . Amlodipine Besy-Benazepril Hcl     REACTION: swelling of feet.  Probably  from Amlodipine component)  . Lipitor [Atorvastatin Calcium]     ? Reaction, ? swelling    Review of Systems: See Interim History  Remaining ROS negative:   Physical Exam: Blood pressure 126/73, pulse 66, temperature 97.8 F (36.6 C), temperature source Oral, height 5\' 10"  (1.778 m), weight 217 lb 11.2 oz (98.748 kg), SpO2 97 %. Wt Readings from Last 3 Encounters:  10/09/15  217 lb 11.2 oz (98.748 kg)  09/05/15 216 lb (97.977 kg)  06/16/15 218 lb (98.884 kg)     General appearance: well nourished Caucasian man [place]: Pharynx no erythema, exudate, mass, or ulcer. No thyromegaly or thyroid nodules Lymph nodes: No cervical, supraclavicular, or axillary lymphadenopathy Breasts:  Lungs: Clear to auscultation, resonant to percussion throughout Heart: Regular rhythm, no murmur, no gallop, no rub, no click, no edema Abdomen: Soft, nontender,  normal bowel sounds, no mass, no organomegaly Extremities: No edema, no calf tenderness Musculoskeletal: no joint deformities GU:  Vascular: Carotid pulses 2+, no bruits,  Neurologic: Alert, oriented, PERRLA, optic discs sharp and vessels normal, no hemorrhage or exudate, cranial nerves grossly normal, motor strength 5 over 5, reflexes 1+ symmetric, upper body coordination normal, gait normal, Skin: No rash; multiple ecchymosis on the skin of the dorsa of his hands and forearms  Lab Results: CBC W/Diff    Component Value Date/Time   WBC 6.6 09/18/2014 1141   WBC 7.0 07/11/2013 1504   RBC 4.92 09/18/2014 1141   RBC 4.59 07/11/2013 1504   HGB 16.0 09/18/2014 1141   HGB 15.0 07/11/2013 1504   HCT 46.1 09/18/2014 1141   HCT 44.0 07/11/2013 1504   PLT 143* 09/18/2014 1141   PLT 138* 07/11/2013 1504   MCV 93.7 09/18/2014 1141   MCV 95.9 07/11/2013 1504   MCH 32.5 09/18/2014 1141   MCH 32.7 07/11/2013 1504   MCHC 34.7 09/18/2014 1141   MCHC 34.1 07/11/2013 1504   RDW 13.5 09/18/2014 1141   RDW 13.4 07/11/2013 1504   LYMPHSABS 2.9 09/18/2014 1141   LYMPHSABS 2.6 07/11/2013 1504   MONOABS 0.7 09/18/2014 1141   MONOABS 0.6 07/11/2013 1504   EOSABS 0.1 09/18/2014 1141   EOSABS 0.1 07/11/2013 1504   BASOSABS 0.1 09/18/2014 1141   BASOSABS 0.1 07/11/2013 1504     Chemistry      Component Value Date/Time   NA 135 09/18/2014 1141   K 4.3 09/18/2014 1141   CL 102 09/18/2014 1141   CO2 27 09/18/2014 1141   BUN 14 09/18/2014 1141   CREATININE 0.87 09/18/2014 1141   CREATININE 0.9 02/08/2014 1012      Component Value Date/Time   CALCIUM 9.6 09/18/2014 1141   ALKPHOS 56 09/18/2014 1141   AST 36 09/18/2014 1141   ALT 54* 09/18/2014 1141   BILITOT 2.3* 09/18/2014 1141       Radiological Studies: No results found.  Impression:  #1. Coagulopathy related to factor V Leiden gene mutation heterozygote status  #2. Metachronous thrombotic events: Initial lower extremity DVT and  subsequent mesenteric vascular thrombosis August 2010 He will continue on chronic Coumadin anticoagulation. 5 mg Tuesday and Saturday, 2.5 mg other days of the week  We had a discussion about updates in our field with respect to optimal anticoagulation in people who need to be on long-term anticoagulation. There has been a paradigm shift with most recent in January 2016 guidelines now preferring the new oral anticoagulants over Coumadin for long-term anticoagulation since bleeding risk appears lower with these drugs, in particular, intracranial bleeding. This being said, he has been on a stable dose of Coumadin for a long time. He is not on multiple medications that would interfere with it. Liver functions are abnormal but have been stable over time. I think for this individual patient I would just continue his Coumadin. I will see him again in one year. There is one clinical trial reported using Eloquis where 50% dose reduction  still maintains anticoagulation efficacy but without increased bleeding. A similar study with Xarelto will be reported next month. If this is also positive study, I would consider putting him on a dose reduced regimen with one of these drugs.  #3. Mild chronic stable thrombocytopenia-stable Alcohol related? Most recent value in our record 143,000 on 09/18/2014.  #4. Fluctuating mild transaminase elevation-stable over time.  CC: Patient Care Team: Binnie Rail, MD as PCP - General (Internal Medicine)   Annia Belt, MD 2/21/20172:11 PM

## 2015-11-08 ENCOUNTER — Telehealth: Payer: Medicare Other | Admitting: Physician Assistant

## 2015-11-08 DIAGNOSIS — J208 Acute bronchitis due to other specified organisms: Secondary | ICD-10-CM

## 2015-11-08 MED ORDER — BENZONATATE 100 MG PO CAPS: 100.0000 mg | ORAL_CAPSULE | Freq: Three times a day (TID) | ORAL | Status: DC | PRN

## 2015-11-08 NOTE — Progress Notes (Signed)
We are sorry that you are not feeling well.  Here is how we plan to help!  Based on what you have shared with me it looks like you have upper respiratory tract inflammation that has resulted in a significant cough.  Inflammation and infection in the upper respiratory tract is commonly called bronchitis and has four common causes:  Allergies, Viral Infections, Acid Reflux and Bacterial Infections.  Allergies, viruses and acid reflux are treated by controlling symptoms or eliminating the cause. An example might be a cough caused by taking certain blood pressure medications. You stop the cough by changing the medication. Another example might be a cough caused by acid reflux. Controlling the reflux helps control the cough.  Based on your presentation I believe you most likely have A cough due to a virus.  This is called viral bronchitis and is best treated by rest, plenty of fluids and control of the cough.  You may use Ibuprofen or Tylenol as directed to help your symptoms.    In addition you may use A prescription cough medication called Tessalon Perles 100mg. You may take 1-2 capsules every 8 hours as needed for your cough.    HOME CARE . Only take medications as instructed by your medical team. . Complete the entire course of an antibiotic. . Drink plenty of fluids and get plenty of rest. . Avoid close contacts especially the very young and the elderly . Cover your mouth if you cough or cough into your sleeve. . Always remember to wash your hands . A steam or ultrasonic humidifier can help congestion.    GET HELP RIGHT AWAY IF: . You develop worsening fever. . You become short of breath . You cough up blood. . Your symptoms persist after you have completed your treatment plan MAKE SURE YOU   Understand these instructions.  Will watch your condition.  Will get help right away if you are not doing well or get worse.  Your e-visit answers were reviewed by a board certified advanced  clinical practitioner to complete your personal care plan.  Depending on the condition, your plan could have included both over the counter or prescription medications. If there is a problem please reply  once you have received a response from your provider. Your safety is important to us.  If you have drug allergies check your prescription carefully.    You can use MyChart to ask questions about today's visit, request a non-urgent call back, or ask for a work or school excuse for 24 hours related to this e-Visit. If it has been greater than 24 hours you will need to follow up with your provider, or enter a new e-Visit to address those concerns. You will get an e-mail in the next two days asking about your experience.  I hope that your e-visit has been valuable and will speed your recovery. Thank you for using e-visits.   

## 2015-11-09 ENCOUNTER — Encounter: Payer: Self-pay | Admitting: Family Medicine

## 2015-11-09 ENCOUNTER — Ambulatory Visit (INDEPENDENT_AMBULATORY_CARE_PROVIDER_SITE_OTHER): Payer: Medicare Other | Admitting: Family Medicine

## 2015-11-09 VITALS — BP 108/70 | HR 87 | Temp 100.4°F | Ht 70.0 in | Wt 212.0 lb

## 2015-11-09 DIAGNOSIS — J209 Acute bronchitis, unspecified: Secondary | ICD-10-CM | POA: Diagnosis not present

## 2015-11-09 MED ORDER — AZITHROMYCIN 250 MG PO TABS: ORAL_TABLET | ORAL | Status: DC

## 2015-11-09 NOTE — Progress Notes (Signed)
Pre visit review using our clinic review tool, if applicable. No additional management support is needed unless otherwise documented below in the visit note. 

## 2015-11-09 NOTE — Progress Notes (Signed)
   Subjective:    Patient ID: [place], male    DOB: [birth year], 81 y.o.   MRN: [id number]  HPI Here for 3 days of chest congestion and coughing up yellow sputum. No fever. On Delsym.    Review of Systems  Constitutional: Negative.   HENT: Negative.   Eyes: Negative.   Respiratory: Positive for cough and chest tightness.   Cardiovascular: Negative.        Objective:   Physical Exam  Constitutional: He appears well-developed and well-nourished.  HENT:  Right Ear: External ear normal.  Left Ear: External ear normal.  Nose: Nose normal.  Mouth/Throat: Oropharynx is clear and moist.  Eyes: Conjunctivae are normal.  Neck: No thyromegaly present.  Pulmonary/Chest: Effort normal. No respiratory distress. He has no wheezes. He has no rales.  Scattered rhonchi   Lymphadenopathy:    He has no cervical adenopathy.          Assessment & Plan:  Bronchitis, treat with  Zpack

## 2015-11-10 ENCOUNTER — Encounter: Payer: Self-pay | Admitting: Internal Medicine

## 2015-11-12 ENCOUNTER — Ambulatory Visit: Payer: Self-pay | Admitting: Internal Medicine

## 2015-11-14 ENCOUNTER — Ambulatory Visit (INDEPENDENT_AMBULATORY_CARE_PROVIDER_SITE_OTHER): Payer: Medicare Other | Admitting: General Practice

## 2015-11-14 ENCOUNTER — Ambulatory Visit (HOSPITAL_BASED_OUTPATIENT_CLINIC_OR_DEPARTMENT_OTHER)
Admission: RE | Admit: 2015-11-14 | Discharge: 2015-11-14 | Disposition: A | Discharge status: Home / Self Care | Payer: Medicare Other | Source: Ambulatory Visit | Attending: Family Medicine | Admitting: Family Medicine

## 2015-11-14 ENCOUNTER — Ambulatory Visit (INDEPENDENT_AMBULATORY_CARE_PROVIDER_SITE_OTHER): Payer: Medicare Other | Admitting: Family Medicine

## 2015-11-14 VITALS — BP 116/88 | HR 109 | Temp 98.0°F | Ht 70.0 in | Wt 207.8 lb

## 2015-11-14 DIAGNOSIS — Z5181 Encounter for therapeutic drug level monitoring: Secondary | ICD-10-CM

## 2015-11-14 DIAGNOSIS — R05 Cough: Secondary | ICD-10-CM

## 2015-11-14 DIAGNOSIS — I81 Portal vein thrombosis: Secondary | ICD-10-CM | POA: Diagnosis not present

## 2015-11-14 DIAGNOSIS — J189 Pneumonia, unspecified organism: Secondary | ICD-10-CM

## 2015-11-14 DIAGNOSIS — R509 Fever, unspecified: Secondary | ICD-10-CM | POA: Insufficient documentation

## 2015-11-14 DIAGNOSIS — R053 Chronic cough: Secondary | ICD-10-CM

## 2015-11-14 DIAGNOSIS — R918 Other nonspecific abnormal finding of lung field: Secondary | ICD-10-CM | POA: Diagnosis not present

## 2015-11-14 DIAGNOSIS — R0602 Shortness of breath: Secondary | ICD-10-CM

## 2015-11-14 DIAGNOSIS — R062 Wheezing: Secondary | ICD-10-CM | POA: Diagnosis not present

## 2015-11-14 DIAGNOSIS — K55069 Acute infarction of intestine, part and extent unspecified: Secondary | ICD-10-CM

## 2015-11-14 DIAGNOSIS — Z86718 Personal history of other venous thrombosis and embolism: Secondary | ICD-10-CM

## 2015-11-14 DIAGNOSIS — D6851 Activated protein C resistance: Secondary | ICD-10-CM | POA: Diagnosis not present

## 2015-11-14 LAB — POCT INR: INR: 1.3

## 2015-11-14 MED ORDER — HYDROCODONE-HOMATROPINE 5-1.5 MG/5ML PO SYRP: 5.0000 mL | ORAL_SOLUTION | Freq: Three times a day (TID) | ORAL | Status: DC | PRN

## 2015-11-14 MED ORDER — CEFDINIR 300 MG PO CAPS: 300.0000 mg | ORAL_CAPSULE | Freq: Two times a day (BID) | ORAL | Status: DC

## 2015-11-14 MED ORDER — ALBUTEROL SULFATE HFA 108 (90 BASE) MCG/ACT IN AERS: 2.0000 | INHALATION_SPRAY | Freq: Four times a day (QID) | RESPIRATORY_TRACT | Status: DC | PRN

## 2015-11-14 NOTE — Progress Notes (Signed)
Pre visit review using our clinic review tool, if applicable. No additional management support is needed unless otherwise documented below in the visit note. INR is low today.  Patient has been sick with chest and nasal congestion, fever etc.  Patient has been taking a Z-pack and has not had an appetite to speak of. Did not take coumadin for 2 or 3 days.  Patient also says equilibrium is off and feels dizzy.  I referred patient to schedulers to try and get an appointment today.  Coumadin increased for 2 days and regular dosage resumed.  Re-check INR is 10 days.

## 2015-11-14 NOTE — Patient Instructions (Signed)
We are going to treat you for possible pneumonia today with omnicef- take it twice a day for 10 days Use the inhaler as needed for cough and wheezing Use the cough syrup as needed- remember this will make you sleepy! Please plan to come in for a repeat chest x-ray in about one month assuming that you feel better If you do not feel better in the next few days please let me know- Sooner if worse.

## 2015-11-14 NOTE — Progress Notes (Signed)
I have reviewed and agree with the plan. 

## 2015-11-14 NOTE — Progress Notes (Signed)
[facility] [address], [place], [place] [address] [phone]  Date:  11/14/2015   Name:  [place]   DOB:  Oct 19, 1934   MRN:  [id number]  PCP:  Binnie Rail, MD    Chief Complaint: URI   History of Present Illness:  [place] is a 81 y.o. very pleasant male patient who presents with the following:  He saw Dr. Sarajane Jews about 5 days ago with complaint of productive cough, low grade fever He was treated with azithromycin. He has continued to feel sick with severe cough. If he tries to lie down he will cough so much he cannot sleep at all.  His ribs hurt from the cough.  He is otherwise not having CP.  He cannot really tell if he might have orthopnea due to the cough when supine.   He now also has ear congestion and his sinuses are full  He normally does not get sick easily.  He may get bronchitis once a year but it geneally goes away in a week.     The cough is still productive and he is getting material from his chest and his sinuses.   He did have a temp of 101.5 this am.   He has been sick for a good 14 days at this point.   He is on coumadin for factor V leiden- INR was too low when checked this am No sudden weight increase to suggest CHF  Wt Readings from Last 3 Encounters:  11/14/15 207 lb 12.8 oz (94.257 kg)  11/09/15 212 lb (96.163 kg)  10/09/15 217 lb 11.2 oz (98.748 kg)   BP Readings from Last 3 Encounters:  11/14/15 116/88  11/09/15 108/70  10/09/15 126/73   Pulse Readings from Last 3 Encounters:  11/14/15 109  11/09/15 87  10/09/15 66     Patient Active Problem List   Diagnosis Date Noted  . Insomnia 09/05/2015  . Frequent loose stools 09/05/2015  . Hx of adenomatous colonic polyps 12/14/2014  . Hematuria, gross 02/03/2014  . 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 Thrombocytopenia, unspecified ([place]) 01/05/2013  . Gilbert syndrome 01/05/2013  . Unspecified adverse effect of  unspecified drug, medicinal and biological substance 01/09/2012  . Barrett's esophagus 03/24/2011  . Factor 5 Leiden mutation, heterozygous ([place]) 03/21/2011  . Essential tremor 04/24/2009  . HYPERLIPIDEMIA 10/03/2008  . Essential hypertension 10/03/2008  . GERD 10/03/2008  . Fasting hyperglycemia 10/03/2008  . DVT, HX OF 10/03/2008    Past Medical History  Diagnosis Date  . Proteinuria age 44  . Gilbert's syndrome   . Benign essential tremor   . Colitis in 20's  . Depression 2006    drug overdose  . Personal history of colonic polyps 05/28/2007    adenomatous  . Insomnia   . 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 Esophageal reflux   . DVT (deep venous thrombosis) ([place]) 1991 & 2010    X 2 in context Factor 5 def  . Diverticulosis   . Hiatal hernia   . Umbilical hernia   . H/O ischemic bowel disease 2010    while off warfarin   . Factor 5 Leiden mutation, heterozygous ([place])   . Herpes zoster 2010    facial  . 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 Colon polyps   . Barrett's esophagus   .  Anxiety   . Arthritis     KNEES,ANKLES,HANDS  . Asthma     AS A TEENAGER  . Clotting disorder ([place])     FACTOR 5  . Hyperlipidemia   . Hypertension   . Osteoporosis   . Hx of adenomatous colonic polyps 12/14/2014    Past Surgical History  Procedure Laterality Date  . Knee arthroscopy Bilateral 2008, 2012    Dr.Aplington bilateral  . Tonsillectomy    . Transurethral resection of prostate       BPH;Dr Dahlstedt  . Cystoscopy      Neg  . Cholecystectomy  05/16/2011     Dr Marlou Starks  . Colonoscopy      Dr Sharlett Iles    Social History  Substance Use Topics  . Smoking status: Former Smoker    Types: Cigarettes    Quit date: 08/18/1994  . Smokeless tobacco: Never Used     Comment: smoked 1956-1996, up to 2 ppd  . Alcohol Use: 1.2 oz/week    2 Cans of beer per week     Comment: Occasional ber/wine.    Family History  Problem  Relation Age of Onset  . Pancreatic cancer Father 1  . Ulcers Father   . Stroke Father 12  . Diabetes Father   . Coronary artery disease Mother   . Osteoporosis Mother   . HIV Brother   . Multiple sclerosis Sister   . Heart attack Paternal Grandfather     early 35s  . Heart attack Paternal Uncle     2 uncles in early 68s  . Aortic aneurysm Brother   . Colon cancer Neg Hx   . Esophageal cancer Neg Hx   . Stomach cancer Neg Hx     Allergies  Allergen Reactions  . Amlodipine Besy-Benazepril Hcl     REACTION: swelling of feet.  Probably  from Amlodipine component)  . Lipitor [Atorvastatin Calcium]     ? Reaction, ? swelling    Medication list has been reviewed and updated.  Current Outpatient Prescriptions on File Prior to Visit  Medication Sig Dispense Refill  . benzonatate (TESSALON) 100 MG capsule Take 1 capsule (100 mg total) by mouth 3 (three) times daily as needed for cough. 30 capsule 0  . cholecalciferol (VITAMIN D) 1000 UNITS tablet Take 1,000 Units by mouth daily.      . Fluticasone Propionate (FLONASE NA) Place into the nose as needed.    . folic acid (FOLVITE) A999333 MCG tablet Take 400 mcg by mouth daily.      Marland Kitchen gabapentin (NEURONTIN) 300 MG capsule Take one cap in morning and two caps at bedtime 270 capsule 1  . gabapentin (NEURONTIN) 300 MG capsule TAKE ONE CAPSULE BY MOUTH EVERY 8 HOURS 270 capsule 0  . losartan-hydrochlorothiazide (HYZAAR) 100-12.5 MG tablet Take 1 tablet by mouth daily. Must keep 09/05/15 appt for future refills 90 tablet 3  . Omega-3 Fatty Acids (FISH OIL) 1200 MG CAPS Take 1,200 mg by mouth 2 (two) times daily.      . Probiotic Product (PROBIOTIC DAILY PO) Take by mouth daily.    . propranolol (INDERAL) 40 MG tablet Take 1 tablet (40 mg total) by mouth 2 (two) times daily. 180 tablet 3  . warfarin (COUMADIN) 5 MG tablet TAKE AS DIRECTED BY ANTICOAGULATION CLINIC 90 tablet 1   No current facility-administered medications on file prior to visit.     Review of Systems:  As per HPI- otherwise negative.   Physical Examination: Danley Danker  Vitals:   11/14/15 1530  BP: 116/88  Pulse: 109  Temp: 98 F (36.7 C)   Filed Vitals:   11/14/15 1530  Height: 5\' 10"  (1.778 m)  Weight: 207 lb 12.8 oz (94.257 kg)   Body mass index is 29.82 kg/(m^2). Ideal Body Weight: Weight in (lb) to have BMI = 25: 173.9  GEN: WDWN, NAD, Non-toxic, A & O x 3, older gentleman who does not appear acutely ill but who is coughing a good big HEENT: Atraumatic, Normocephalic. Neck supple. No masses, No LAD. Ears and Nose: No external deformity. CV: RRR, No M/G/R. No JVD. No thrill. No extra heart sounds. PULM:mild wheezing and ronchi bilaterally. No retractions. No resp. distress. No accessory muscle use. ABD: S, NT, ND, +BS. No rebound. No HSM. EXTR: No c/c/e NEURO Normal gait.  PSYCH: Normally interactive. Conversant. Not depressed or anxious appearing.  Calm demeanor.   Dg Chest 2 View  11/14/2015  CLINICAL DATA:  Cough, congestion and fever for 2 weeks. EXAM: CHEST  2 VIEW COMPARISON:  05/06/2011. FINDINGS: The heart size and mediastinal contours are stable. There is aortic atherosclerosis. Increased interstitial markings are present at both lung bases with associated central airway thickening. No edema, confluent airspace opacity or significant pleural effusion identified. The bones appear unchanged. IMPRESSION: Increased interstitial prominence at both lung bases since prior studies of 2012. These findings may reflect progressive chronic interstitial lung disease or potentially atypical inflammation. No consolidation or definite edema. Short term radiographic follow up suggested. Electronically Signed   By: Richardean Sale M.D.   On: 11/14/2015 16:31    Assessment and Plan: CAP (community acquired pneumonia) - Plan: cefdinir (OMNICEF) 300 MG capsule  Low grade fever - Plan: DG Chest 2 View, CBC, Comprehensive metabolic panel  Persistent cough - Plan: DG  Chest 2 View, HYDROcodone-homatropine (HYCODAN) 5-1.5 MG/5ML syrup  Wheezing - Plan: albuterol (PROVENTIL HFA;VENTOLIN HFA) 108 (90 Base) MCG/ACT inhaler  SOB (shortness of breath) - Plan: B Nat Peptide  Here today with persistent cough, wheezing and low grade fevers.   Possible CAP although CXR is non -specific.  Will add a course of omnicef, hycodan for cough and albuterol.  Advised that hycodan can make him sleepy- he uses neurontin just prn for pain and will avoid this combination Will check CMP and BNP to ensure no element of CHF Discussed how to use albuterol with him in detail  Signed Lamar Blinks, MD

## 2015-11-15 LAB — COMPREHENSIVE METABOLIC PANEL
ALBUMIN: 4 g/dL (ref 3.5–5.2)
ALK PHOS: 61 U/L (ref 39–117)
ALT: 17 U/L (ref 0–53)
AST: 33 U/L (ref 0–37)
BUN: 18 mg/dL (ref 6–23)
CALCIUM: 9.8 mg/dL (ref 8.4–10.5)
CHLORIDE: 92 meq/L — AB (ref 96–112)
CO2: 27 mEq/L (ref 19–32)
Creatinine, Ser: 0.92 mg/dL (ref 0.40–1.50)
GFR: 84.33 mL/min (ref >60.00)
Glucose, Bld: 123 mg/dL — ABNORMAL HIGH (ref 70–99)
POTASSIUM: 3.1 meq/L — AB (ref 3.5–5.1)
Sodium: 130 mEq/L — ABNORMAL LOW (ref 135–145)
Total Bilirubin: 1.6 mg/dL — ABNORMAL HIGH (ref 0.2–1.2)
Total Protein: 8.6 g/dL — ABNORMAL HIGH (ref 6.0–8.3)

## 2015-11-15 LAB — CBC
HEMATOCRIT: 45.9 % (ref 39.0–52.0)
HEMOGLOBIN: 15.7 g/dL (ref 13.0–17.0)
MCHC: 34.2 g/dL (ref 30.0–36.0)
MCV: 95.4 fl (ref 78.0–100.0)
Platelets: 193 10*3/uL (ref 150.0–400.0)
RBC: 4.81 Mil/uL (ref 4.22–5.81)
RDW: 13.6 % (ref 11.5–15.5)
WBC: 12.4 10*3/uL — ABNORMAL HIGH (ref 4.0–10.5)

## 2015-11-15 LAB — BRAIN NATRIURETIC PEPTIDE: PRO B NATRI PEPTIDE: 74 pg/mL (ref 0.0–100.0)

## 2015-11-16 ENCOUNTER — Telehealth: Payer: Self-pay | Admitting: Family Medicine

## 2015-11-16 NOTE — Telephone Encounter (Signed)
Called to check on him- he is somewhat better. His breathing is better, he is able to sleep better.  Went over labs and x-ray Results for orders placed or performed in visit on 11/14/15  CBC  Result Value Ref Range   WBC 12.4 (H) 4.0 - 10.5 K/uL   RBC 4.81 4.22 - 5.81 Mil/uL   Platelets 193.0 150.0 - 400.0 K/uL   Hemoglobin 15.7 13.0 - 17.0 g/dL   HCT 45.9 39.0 - 52.0 %   MCV 95.4 78.0 - 100.0 fl   MCHC 34.2 30.0 - 36.0 g/dL   RDW 13.6 11.5 - 15.5 %  Comprehensive metabolic panel  Result Value Ref Range   Sodium 130 (L) 135 - 145 mEq/L   Potassium 3.1 (L) 3.5 - 5.1 mEq/L   Chloride 92 (L) 96 - 112 mEq/L   CO2 27 19 - 32 mEq/L   Glucose, Bld 123 (H) 70 - 99 mg/dL   BUN 18 6 - 23 mg/dL   Creatinine, Ser 0.92 0.40 - 1.50 mg/dL   Total Bilirubin 1.6 (H) 0.2 - 1.2 mg/dL   Alkaline Phosphatase 61 39 - 117 U/L   AST 33 0 - 37 U/L   ALT 17 0 - 53 U/L   Total Protein 8.6 (H) 6.0 - 8.3 g/dL   Albumin 4.0 3.5 - 5.2 g/dL   Calcium 9.8 8.4 - 10.5 mg/dL   GFR 84.33 >60.00 mL/min  B Nat Peptide  Result Value Ref Range   Pro B Natriuretic peptide (BNP) 74.0 0.0 - 100.0 pg/mL   His K is low- never had this before and he does not have any K supplement at home.  Asked him to eat 2 bananas a day for the next week and he is happy to do so.  Also will plan to repeat CXR in about one month

## 2015-11-19 ENCOUNTER — Encounter: Payer: Self-pay | Admitting: Family Medicine

## 2015-11-20 ENCOUNTER — Other Ambulatory Visit: Payer: Self-pay | Admitting: Family Medicine

## 2015-11-20 DIAGNOSIS — E876 Hypokalemia: Secondary | ICD-10-CM

## 2015-11-21 ENCOUNTER — Encounter: Payer: Self-pay | Admitting: Family Medicine

## 2015-11-23 ENCOUNTER — Encounter: Payer: Self-pay | Admitting: Family Medicine

## 2015-11-23 ENCOUNTER — Ambulatory Visit (INDEPENDENT_AMBULATORY_CARE_PROVIDER_SITE_OTHER): Payer: Medicare Other | Admitting: General Practice

## 2015-11-23 ENCOUNTER — Other Ambulatory Visit (INDEPENDENT_AMBULATORY_CARE_PROVIDER_SITE_OTHER): Payer: Medicare Other

## 2015-11-23 DIAGNOSIS — Z86718 Personal history of other venous thrombosis and embolism: Secondary | ICD-10-CM

## 2015-11-23 DIAGNOSIS — D6851 Activated protein C resistance: Secondary | ICD-10-CM | POA: Diagnosis not present

## 2015-11-23 DIAGNOSIS — E876 Hypokalemia: Secondary | ICD-10-CM

## 2015-11-23 DIAGNOSIS — Z5181 Encounter for therapeutic drug level monitoring: Secondary | ICD-10-CM | POA: Diagnosis not present

## 2015-11-23 DIAGNOSIS — K55069 Acute infarction of intestine, part and extent unspecified: Secondary | ICD-10-CM

## 2015-11-23 DIAGNOSIS — I81 Portal vein thrombosis: Secondary | ICD-10-CM

## 2015-11-23 LAB — BASIC METABOLIC PANEL
BUN: 10 mg/dL (ref 6–23)
CHLORIDE: 99 meq/L (ref 96–112)
CO2: 29 meq/L (ref 19–32)
Calcium: 9.7 mg/dL (ref 8.4–10.5)
Creatinine, Ser: 0.82 mg/dL (ref 0.40–1.50)
GFR: 96.3 mL/min (ref >60.00)
GLUCOSE: 111 mg/dL — AB (ref 70–99)
POTASSIUM: 4 meq/L (ref 3.5–5.1)
Sodium: 136 mEq/L (ref 135–145)

## 2015-11-23 LAB — POCT INR: INR: 3.2

## 2015-11-23 NOTE — Progress Notes (Signed)
Pre visit review using our clinic review tool, if applicable. No additional management support is needed unless otherwise documented below in the visit note. 

## 2015-11-23 NOTE — Progress Notes (Signed)
I have reviewed and agree with the plan. 

## 2015-11-27 ENCOUNTER — Encounter: Payer: Self-pay | Admitting: Family Medicine

## 2015-11-28 ENCOUNTER — Ambulatory Visit (HOSPITAL_BASED_OUTPATIENT_CLINIC_OR_DEPARTMENT_OTHER)
Admission: RE | Admit: 2015-11-28 | Discharge: 2015-11-28 | Disposition: A | Discharge status: Home / Self Care | Payer: Medicare Other | Source: Ambulatory Visit | Attending: Family Medicine | Admitting: Family Medicine

## 2015-11-28 ENCOUNTER — Encounter: Payer: Self-pay | Admitting: Family Medicine

## 2015-11-28 ENCOUNTER — Ambulatory Visit (INDEPENDENT_AMBULATORY_CARE_PROVIDER_SITE_OTHER): Payer: Medicare Other | Admitting: Family Medicine

## 2015-11-28 ENCOUNTER — Telehealth: Payer: Self-pay | Admitting: *Deleted

## 2015-11-28 VITALS — BP 130/80 | HR 68 | Temp 98.2°F | Resp 16 | Ht 70.0 in | Wt 206.0 lb

## 2015-11-28 DIAGNOSIS — I951 Orthostatic hypotension: Secondary | ICD-10-CM | POA: Diagnosis not present

## 2015-11-28 DIAGNOSIS — I499 Cardiac arrhythmia, unspecified: Secondary | ICD-10-CM | POA: Diagnosis not present

## 2015-11-28 DIAGNOSIS — G250 Essential tremor: Secondary | ICD-10-CM

## 2015-11-28 DIAGNOSIS — J189 Pneumonia, unspecified organism: Secondary | ICD-10-CM | POA: Insufficient documentation

## 2015-11-28 DIAGNOSIS — E876 Hypokalemia: Secondary | ICD-10-CM | POA: Diagnosis not present

## 2015-11-28 DIAGNOSIS — J849 Interstitial pulmonary disease, unspecified: Secondary | ICD-10-CM

## 2015-11-28 LAB — BASIC METABOLIC PANEL
BUN: 16 mg/dL (ref 6–23)
CHLORIDE: 103 meq/L (ref 96–112)
CO2: 29 meq/L (ref 19–32)
CREATININE: 0.98 mg/dL (ref 0.40–1.50)
Calcium: 9.7 mg/dL (ref 8.4–10.5)
GFR: 78.39 mL/min (ref >60.00)
GLUCOSE: 106 mg/dL — AB (ref 70–99)
Potassium: 4.2 mEq/L (ref 3.5–5.1)
Sodium: 139 mEq/L (ref 135–145)

## 2015-11-28 MED ORDER — PANTOPRAZOLE SODIUM 40 MG PO TBEC: 40.0000 mg | DELAYED_RELEASE_TABLET | Freq: Every day | ORAL | Status: DC

## 2015-11-28 NOTE — Progress Notes (Signed)
[place] [facility] [address] [place], [place] [address] [phone]  Date:  11/28/2015   Name:  HOUA READE   DOB:  [birth year]   MRN:  [id number]  PCP:  Lamar Blinks, MD    Chief Complaint: Dizziness   History of Present Illness:  Patrick Hatfield is a 81 y.o. very pleasant male patient who presents with the following:  Seen by myself 3/29 with CAP presenting with cough and fevers, fatigue.  He also does have ILD apparent on his CXR- this is a new dx We treated him with omnicef and hycodan, albuterol Corrected hypokalemia with diet BNP was normal  He is overall much improved  Here today for a recheck. He notes that his congestion seemed to be getting better although he would still have some with deep breath. His cough and chest mucus seemed to have cleared up.  He still has sinus congestion.  His main concern today is that he feels dizzy. He will be ok in the am but will feel dizzy as the day goes on.  He will feel like he wants to get up slowly. But the dizziness is not related to moving his head, does not seem like vertigo but is more consistent with lighheadedness He has felt dizzy for about a week now.   He does have tinnitus but this is not new.   He will have a headache in the morning.  It will feel dull, he will use tylenol some days He did try zyrtec that helped him.  No further fevers Appetite is ok, not as robust as it might be.  He has lost some weight since he got sick Breathing feels "ok, but I know that I can't take a deep breath."  He has used the albuterol 10-12 times over the last week and it does help some  Orthostatic VS for the past 24 hrs:  BP- Lying Pulse- Lying BP- Sitting Pulse- Sitting BP- Standing at 0 minutes Pulse- Standing at 0 minutes  11/28/15 1156 124/78 mmHg 65 100/70 mmHg 68 100/68 mmHg 69    Wt Readings from Last 3 Encounters:  11/28/15 206 lb (93.441 kg)  11/14/15 207 lb  12.8 oz (94.257 kg)  11/09/15 212 lb (96.163 kg)    Dg Chest 2 View  11/14/2015  CLINICAL DATA:  Cough, congestion and fever for 2 weeks. EXAM: CHEST  2 VIEW COMPARISON:  05/06/2011. FINDINGS: The heart size and mediastinal contours are stable. There is aortic atherosclerosis. Increased interstitial markings are present at both lung bases with associated central airway thickening. No edema, confluent airspace opacity or significant pleural effusion identified. The bones appear unchanged. IMPRESSION: Increased interstitial prominence at both lung bases since prior studies of 2012. These findings may reflect progressive chronic interstitial lung disease or potentially atypical inflammation. No consolidation or definite edema. Short term radiographic follow up suggested. Electronically Signed   By: Richardean Sale M.D.   On: 11/14/2015 16:31     Patient Active Problem List   Diagnosis Date Noted  . Insomnia 09/05/2015  . Frequent loose stools 09/05/2015  . Hx of adenomatous colonic polyps 12/14/2014  . Hematuria, gross 02/03/2014  . 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 Thrombocytopenia, unspecified ([place]) 01/05/2013  . Gilbert syndrome 01/05/2013  . Unspecified adverse effect of unspecified drug, medicinal and biological substance 01/09/2012  . Barrett's esophagus 03/24/2011  . Factor 5 Leiden mutation, heterozygous ([place]) 03/21/2011  . Essential  tremor 04/24/2009  . HYPERLIPIDEMIA 10/03/2008  . Essential hypertension 10/03/2008  . GERD 10/03/2008  . Fasting hyperglycemia 10/03/2008  . DVT, HX OF 10/03/2008    Past Medical History  Diagnosis Date  . Proteinuria age 43  . Gilbert's syndrome   . Benign essential tremor   . Colitis in 20's  . Depression 2006    drug overdose  . Personal history of colonic polyps 05/28/2007    adenomatous  . Insomnia   . 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 Esophageal reflux   . DVT (deep venous thrombosis) ([place])  1991 & 2010    X 2 in context Factor 5 def  . Diverticulosis   . Hiatal hernia   . Umbilical hernia   . H/O ischemic bowel disease 2010    while off warfarin   . Factor 5 Leiden mutation, heterozygous ([place])   . Herpes zoster 2010    facial  . 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 Colon polyps   . Barrett's esophagus   . Anxiety   . Arthritis     KNEES,ANKLES,HANDS  . Asthma     AS A TEENAGER  . Clotting disorder ([place])     FACTOR 5  . Hyperlipidemia   . Hypertension   . Osteoporosis   . Hx of adenomatous colonic polyps 12/14/2014    Past Surgical History  Procedure Laterality Date  . Knee arthroscopy Bilateral 2008, 2012    Dr.Aplington bilateral  . Tonsillectomy    . Transurethral resection of prostate       BPH;Dr Dahlstedt  . Cystoscopy      Neg  . Cholecystectomy  05/16/2011     Dr Marlou Starks  . Colonoscopy      Dr Sharlett Iles    Social History  Substance Use Topics  . Smoking status: Former Smoker    Types: Cigarettes    Quit date: 08/18/1994  . Smokeless tobacco: Never Used     Comment: smoked 1956-1996, up to 2 ppd  . Alcohol Use: 1.2 oz/week    2 Cans of beer per week     Comment: Occasional ber/wine.    Family History  Problem Relation Age of Onset  . Pancreatic cancer Father 82  . Ulcers Father   . Stroke Father 1  . Diabetes Father   . Coronary artery disease Mother   . Osteoporosis Mother   . HIV Brother   . Multiple sclerosis Sister   . Heart attack Paternal Grandfather     early 15s  . Heart attack Paternal Uncle     2 uncles in early 41s  . Aortic aneurysm Brother   . Colon cancer Neg Hx   . Esophageal cancer Neg Hx   . Stomach cancer Neg Hx     Allergies  Allergen Reactions  . Amlodipine Besy-Benazepril Hcl     REACTION: swelling of feet.  Probably  from Amlodipine component)  . Lipitor [Atorvastatin Calcium]     ? Reaction, ? swelling    Medication list has been reviewed and updated.  Current Outpatient  Prescriptions on File Prior to Visit  Medication Sig Dispense Refill  . albuterol (PROVENTIL HFA;VENTOLIN HFA) 108 (90 Base) MCG/ACT inhaler Inhale 2 puffs into the lungs every 6 (six) hours as needed for wheezing or shortness of breath. 1 Inhaler 0  . cholecalciferol (VITAMIN D) 1000 UNITS tablet Take 1,000 Units by mouth daily.      Marland Kitchen  Fluticasone Propionate (FLONASE NA) Place into the nose as needed.    . folic acid (FOLVITE) A999333 MCG tablet Take 400 mcg by mouth daily.      Marland Kitchen gabapentin (NEURONTIN) 300 MG capsule Take one cap in morning and two caps at bedtime 270 capsule 1  . losartan-hydrochlorothiazide (HYZAAR) 100-12.5 MG tablet Take 1 tablet by mouth daily. Must keep 09/05/15 appt for future refills 90 tablet 3  . Omega-3 Fatty Acids (FISH OIL) 1200 MG CAPS Take 1,200 mg by mouth 2 (two) times daily.      . Probiotic Product (PROBIOTIC DAILY PO) Take by mouth daily.    . propranolol (INDERAL) 40 MG tablet Take 1 tablet (40 mg total) by mouth 2 (two) times daily. 180 tablet 3  . warfarin (COUMADIN) 5 MG tablet TAKE AS DIRECTED BY ANTICOAGULATION CLINIC 90 tablet 1   No current facility-administered medications on file prior to visit.    Review of Systems:  As per HPI- otherwise negative.   Physical Examination: Filed Vitals:   11/28/15 1143  BP: 130/80  Pulse: 68  Temp: 98.2 F (36.8 C)  Resp: 16   Filed Vitals:   11/28/15 1143  Height: 5\' 10"  ([id number] m)  Weight: 206 lb (93.441 kg)   Body mass index is 29.56 kg/(m^2). Ideal Body Weight: Weight in (lb) to have BMI = 25: 173.9  GEN: WDWN, NAD, Non-toxic, A & O x 3, looks well and much better than last visit HEENT: Atraumatic, Normocephalic. Neck supple. No masses, No LAD. Ears and Nose: No external deformity. CV: some irregular beats, No M/G/R. No JVD. No thrill. No extra heart sounds. PULM: CTA B, no wheezes, crackles, rhonchi. No retractions. No resp. distress. No accessory muscle use. ABD: S, NT, ND, +BS. No rebound.  No HSM. EXTR: No c/c/e NEURO Normal gait.  Essential tremor mostly of his head. This is long-standing PSYCH: Normally interactive. Conversant. Not depressed or anxious appearing.  Calm demeanor.   EKG:  Sinus rhythm with frequent ectopic beats CXR:Dg Chest 2 View  11/28/2015  CLINICAL DATA:  History of recent pneumonia diagnosed four weeks ago, still with some congestion, former smoking history EXAM: CHEST  2 VIEW COMPARISON:  Chest x-ray of 11/14/2015 FINDINGS: No pneumonia or effusion is seen. There is some peribronchial thickening which may indicate bronchitis. Mediastinal and hilar contours are unremarkable. The heart is within normal limits in size. No bony abnormality is seen. IMPRESSION: No pneumonia.  Peribronchial thickening may indicate bronchitis. Electronically Signed   By: Ivar Drape M.D.   On: 11/28/2015 13:05   Dg Chest 2 View  11/14/2015  CLINICAL DATA:  Cough, congestion and fever for 2 weeks. EXAM: CHEST  2 VIEW COMPARISON:  05/06/2011. FINDINGS: The heart size and mediastinal contours are stable. There is aortic atherosclerosis. Increased interstitial markings are present at both lung bases with associated central airway thickening. No edema, confluent airspace opacity or significant pleural effusion identified. The bones appear unchanged. IMPRESSION: Increased interstitial prominence at both lung bases since prior studies of 2012. These findings may reflect progressive chronic interstitial lung disease or potentially atypical inflammation. No consolidation or definite edema. Short term radiographic follow up suggested. Electronically Signed   By: Richardean Sale M.D.   On: 11/14/2015 16:31    Results for orders placed or performed in visit on [id number]  Basic metabolic panel  Result Value Ref Range   Sodium 139 135 - 145 mEq/L   Potassium 4.2 3.5 - 5.1 mEq/L   Chloride  103 96 - 112 mEq/L   CO2 29 19 - 32 mEq/L   Glucose, Bld 106 (H) 70 - 99 mg/dL   BUN 16 6 - 23 mg/dL    Creatinine, Ser 0.98 0.40 - 1.50 mg/dL   Calcium 9.7 8.4 - 10.5 mg/dL   GFR 78.39 >60.00 mL/min   Assessment and Plan: Orthostatic hypotension  Irregular heart beat - Plan: EKG 12-Lead  CAP (community acquired pneumonia) - Plan: DG Chest 2 View  Hypokalemia - Plan: Basic metabolic panel  Interstitial lung disease ([place]) - Plan: Ambulatory referral to Pulmonology  Essential tremor  Here today to recheck his recent CXR and also to discuss lightheadedness.  Suspect he is feeling lightheaded due to mild orthostatic hypotension.  He has lost some weight with illness and is not eating as much.  Advised him to halve his BP medication for now Hypokalemia is resolved He is in sinus rhythm with ectopic beats- no need for intervention Essential tremor on BB Presumed CAP is improved.  We now have a question of ILD.  He would like to go ahead and see pulmonology for further evaluation- I will take care of this referral Asked him to keep me posted as to how he is feeling and he agreed to do so  Signed Lamar Blinks, MD

## 2015-11-28 NOTE — Patient Instructions (Signed)
Please go to lab and then downstairs for your chest x-ray I will be in touch with you later on today Your symptoms may be due to your blood pressure being too low Take just half of your hyzaar (losartan/ hctz) pill for now

## 2015-11-28 NOTE — Telephone Encounter (Signed)
Received fax from [facility] for refill of pantoprazole 40mg  daily. Med not on current med list. Spoke with pt and verified that he is still taking medication from previous PCP and would like [facility]ill [facility] [address] [facility]. [facility] [address].  Refills sent. Pt aware.

## 2015-11-28 NOTE — Progress Notes (Signed)
Pre visit review using our clinic review tool, if applicable. No additional management support is needed unless otherwise documented below in the visit note. 

## 2015-12-03 ENCOUNTER — Encounter: Payer: Self-pay | Admitting: Family Medicine

## 2015-12-10 ENCOUNTER — Encounter: Payer: Self-pay | Admitting: Family Medicine

## 2015-12-20 ENCOUNTER — Ambulatory Visit (INDEPENDENT_AMBULATORY_CARE_PROVIDER_SITE_OTHER): Payer: Medicare Other | Admitting: Pulmonary Disease

## 2015-12-20 ENCOUNTER — Encounter: Payer: Self-pay | Admitting: Pulmonary Disease

## 2015-12-20 VITALS — BP 102/62 | HR 73 | Ht 70.0 in | Wt 214.2 lb

## 2015-12-20 DIAGNOSIS — J849 Interstitial pulmonary disease, unspecified: Secondary | ICD-10-CM | POA: Diagnosis not present

## 2015-12-20 NOTE — Patient Instructions (Signed)
We will schedule you for high-resolution CT of the chest and pulmonary function test.  Return to clinic in 1 month to review results and decide on further follow-up as needed.

## 2015-12-20 NOTE — Progress Notes (Signed)
Subjective:    Patient ID: Patrick Hatfield, male    DOB: [birth year], 81 y.o.   MRN: [id number]  HPI Consult for evaluation of interstitial lung disease.  Mr. Ganschow is a 81 year old with past medical history of DVT/PE, factor V leidin mutation. He had an episode of pneumonia in March 2017 present illness cough, fevers and abnormal chest x-ray. He was treated with Omnicef, cough medication and inhaler. He is completely recovered now back to baseline and does not have any respiratory complaints. The chest x-ray at that time showed some interstitial prominence suggestive of ILD. He has been referred here for further follow-up.  He has a diagnosis of asthma in the past but is not on any inhalers. He had a DVT/PE in 1991 and mesenteric thrombus in 2010. He was diagnosed with factor V Leiden mutation and is on indefinite anticoagulation with coumadin. He has significant issues with reflux but is well controlled on Protonix daily. 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 He has a significant smoking history. Quit in 1996. 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 He has 2 dogs as pets, no birds. He has a hot tub but does not use it. No mold issues at home.  DATA: CXR 11/28/15 No pneumonia. Peribronchial thickening may indicate bronchitis.  CXR 11/14/15 B/L interstitial infiltrates.  Social History: 80-pack-year smoking history. Quit in 1996. Occasional alcohol use, no drug use.  Family History: Mother-coronary artery disease, osteoporosis. Father-diabetes, pancreatic cancer, stroke, ulcers. Brother-aortic aneurysm.  Past Medical History  Diagnosis Date  . Proteinuria age 78  . Gilbert's syndrome   . Benign essential tremor   . Colitis in 20's  . Depression 2006    drug overdose  . Personal history of colonic polyps 05/28/2007    adenomatous  . Insomnia   . Congenital deficiency of other clotting factors      factor 5  . Esophageal reflux   . DVT (deep venous thrombosis) ([place]) 1991 & 2010    X 2 in context Factor 5 def  . Diverticulosis   . Hiatal hernia   . Umbilical hernia   . H/O ischemic bowel disease 2010    while off warfarin   . Factor 5 Leiden mutation, heterozygous ([place])   . Herpes zoster 2010    facial  . 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 Colon polyps   . Barrett's esophagus   . Anxiety   . Arthritis     KNEES,ANKLES,HANDS  . Asthma     AS A TEENAGER  . Clotting disorder ([place])     FACTOR 5  . Hyperlipidemia   . Hypertension   . Osteoporosis   . Hx of adenomatous colonic polyps 12/14/2014    Current outpatient prescriptions:  .  acetaminophen (TYLENOL) 325 MG tablet, Per bottle as needed, Disp: , Rfl:  .  albuterol (PROVENTIL HFA;VENTOLIN HFA) 108 (90 Base) MCG/ACT inhaler, Inhale 2 puffs into the lungs every 6 (six) hours as needed for wheezing or shortness of breath., Disp: 1 Inhaler, Rfl: 0 .  cholecalciferol (VITAMIN D) 1000 UNITS tablet, Take 1,000 Units by mouth daily.  , Disp: , Rfl:  .  Fluticasone Propionate (FLONASE NA), Place into the nose as needed., Disp: , Rfl:  .  folic acid (FOLVITE) A999333 MCG tablet, Take 400 mcg by mouth daily.  , Disp: , Rfl:  .  gabapentin (NEURONTIN) 300 MG capsule, Take one cap in morning and two caps at bedtime, Disp: 270 capsule, Rfl: 1 .  hyoscyamine (LEVSIN) 0.125 MG tablet, Take 0.125 mg by mouth every 4 (four) hours as needed., Disp: , Rfl:  .  losartan-hydrochlorothiazide (HYZAAR) 100-12.5 MG tablet, Take 0.5 tablets by mouth daily., Disp: , Rfl:  .  Omega-3 Fatty Acids (FISH OIL) 1200 MG CAPS, Take 1,200 mg by mouth 2 (two) times daily.  , Disp: , Rfl:  .  pantoprazole (PROTONIX) 40 MG tablet, Take 1 tablet (40 mg total) by mouth daily., Disp: 90 tablet, Rfl: 1 .  propranolol (INDERAL) 40 MG tablet, Take 1 tablet (40 mg total) by mouth 2 (two) times daily., Disp: 180 tablet, Rfl: 3 .  warfarin (COUMADIN)  5 MG tablet, TAKE AS DIRECTED BY ANTICOAGULATION CLINIC, Disp: 90 tablet, Rfl: 1  Review of Systems No cough, some wheezing, sputum production, dyspnea, hemoptysis. No chest pain, palpitation. No nausea, vomiting, diarrhea, constipation. No joint pain, rash, joint stiffness, tenderness. No fevers, chills, loss of weight, loss of appetite. All other review of systems are negative.    Objective:   Physical Exam Blood pressure 102/62, pulse 73, height 5\' 10"  (1.778 m), weight 214 lb 3.2 oz (97.16 kg), SpO2 96 %. Gen: No apparent distress Neuro: No gross focal deficits. HEENT: No JVD, lymphadenopathy, thyromegaly. RS: Clear, No wheeze or crackles CVS: S1-S2 heard, no murmurs rubs gallops. Abdomen: Soft, positive bowel sounds. Musculoskeletal: No edema.    Assessment & Plan:  Evaluation for ILD. I reviewed his chest x-rays which does show interstitial prominence. However this was in the setting of pneumonia and a repeat x-ray last month shows some improvement. He's had remote exposure to asbestos and a smoking history but no other exposures or signs, symptoms suggestive of connective tissue, autoimmune disease.  I will evaluate further by getting a high-resolution CT and pulmonary function tests. He will return in a month to review these results and plan for further testing, treatment as needed.  Plan: - High res CT - PFTs  Marshell Garfinkel MD [facility] Pager [phone] If no answer or after 3pm call: [phone] 12/20/2015, 3:13 PM

## 2015-12-21 ENCOUNTER — Encounter: Payer: Self-pay | Admitting: Pulmonary Disease

## 2015-12-21 ENCOUNTER — Ambulatory Visit (INDEPENDENT_AMBULATORY_CARE_PROVIDER_SITE_OTHER): Payer: Medicare Other | Admitting: General Practice

## 2015-12-21 DIAGNOSIS — D6851 Activated protein C resistance: Secondary | ICD-10-CM | POA: Diagnosis not present

## 2015-12-21 DIAGNOSIS — I81 Portal vein thrombosis: Secondary | ICD-10-CM

## 2015-12-21 DIAGNOSIS — Z5181 Encounter for therapeutic drug level monitoring: Secondary | ICD-10-CM | POA: Diagnosis not present

## 2015-12-21 DIAGNOSIS — Z86718 Personal history of other venous thrombosis and embolism: Secondary | ICD-10-CM | POA: Diagnosis not present

## 2015-12-21 DIAGNOSIS — K55069 Acute infarction of intestine, part and extent unspecified: Secondary | ICD-10-CM

## 2015-12-21 LAB — POCT INR: INR: 2.5

## 2015-12-21 NOTE — Progress Notes (Signed)
Pre visit review using our clinic review tool, if applicable. No additional management support is needed unless otherwise documented below in the visit note. 

## 2016-01-13 ENCOUNTER — Encounter: Payer: Self-pay | Admitting: Family Medicine

## 2016-01-17 ENCOUNTER — Ambulatory Visit (INDEPENDENT_AMBULATORY_CARE_PROVIDER_SITE_OTHER): Payer: Medicare Other | Admitting: Family Medicine

## 2016-01-17 ENCOUNTER — Encounter: Payer: Self-pay | Admitting: Family Medicine

## 2016-01-17 VITALS — BP 110/70 | HR 71 | Temp 97.6°F | Ht 70.0 in | Wt 212.0 lb

## 2016-01-17 DIAGNOSIS — I10 Essential (primary) hypertension: Secondary | ICD-10-CM

## 2016-01-17 DIAGNOSIS — M62838 Other muscle spasm: Secondary | ICD-10-CM

## 2016-01-17 DIAGNOSIS — M6248 Contracture of muscle, other site: Secondary | ICD-10-CM | POA: Diagnosis not present

## 2016-01-17 MED ORDER — DIAZEPAM 5 MG PO TABS: ORAL_TABLET | ORAL | Status: DC

## 2016-01-17 NOTE — Progress Notes (Signed)
[place] at [facility] [address] [place],  [address] [phone]  Date:  01/17/2016   Name:  Patrick Hatfield   DOB:  [birth year]   MRN:  [id number]  PCP:  Lamar Blinks, MD    Chief Complaint: Neck Pain   History of Present Illness:  Patrick Hatfield is a 81 y.o. very pleasant male patient who presents with the following:  Seen a couple of months ago with pneumonia.  He is here today with a neck problem- he has this last fall as well.  He was treated with a muscle relaxer that did not work, then another muscle relaxer (diazepam) that did work for him and the sx resolved. He did fine in this regard until recently He notes that he cannot sleep well as his neck is so painful. Sx started about 5 days ago, NKI; no MVA, no fall.  No HA, no fevers or chills, no cough He cannot think of any cause of this pain.  Seems to be just a crick in his neck He feels a bit better today but still has some pain remaining Sx started on the left, but now is more across the back of his neck No numbness, pain or weakness down his arms He has used some aleve which did help him some   He will do a breathing test and a lung CT next week- however he feels that his breathing is back to normal. He is seeing pulmonology- Dr. Vaughan Browner.    He is not taking the neurontin recently   Admits to being under some stress as his wife has just started another round of chemo for her CLL  BP is a bit low today. He is checking it at home and SBP is running 120- 130.  No lightheaded feeling or other sx here, no CP  Wt Readings from Last 3 Encounters:  01/17/16 212 lb (96.163 kg)  12/20/15 214 lb 3.2 oz (97.16 kg)  11/28/15 206 lb (93.441 kg)     Patient Active Problem List   Diagnosis Date Noted  . Insomnia 09/05/2015  . Frequent loose stools 09/05/2015  . Hx of adenomatous colonic polyps 12/14/2014  . Hematuria, gross 02/03/2014  . Thrombosis of mesenteric  vein 07/12/2013  . Thrombocytopenia, unspecified ([place]) 01/05/2013  . Gilbert syndrome 01/05/2013  . Unspecified adverse effect of unspecified drug, medicinal and biological substance 01/09/2012  . Barrett's esophagus 03/24/2011  . Factor 5 Leiden mutation, heterozygous ([place]) 03/21/2011  . Essential tremor 04/24/2009  . HYPERLIPIDEMIA 10/03/2008  . Essential hypertension 10/03/2008  . GERD 10/03/2008  . Fasting hyperglycemia 10/03/2008  . DVT, HX OF 10/03/2008    Past Medical History  Diagnosis Date  . Proteinuria age 32  . Gilbert's syndrome   . Benign essential tremor   . Colitis in 20's  . Depression 2006    drug overdose  . Personal history of colonic polyps 05/28/2007    adenomatous  . Insomnia   . 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 Esophageal reflux   . DVT (deep venous thrombosis) ([place]) 1991 & 2010    X 2 in context Factor 5 def  . Diverticulosis   . Hiatal hernia   . Umbilical hernia   . H/O ischemic bowel disease 2010    while off warfarin   . Factor 5 Leiden mutation, heterozygous ([place])   . Herpes zoster 2010  facial  . 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 Colon polyps   . Barrett's esophagus   . Anxiety   . Arthritis     KNEES,ANKLES,HANDS  . Asthma     AS A TEENAGER  . Clotting disorder ([place])     FACTOR 5  . Hyperlipidemia   . Hypertension   . Osteoporosis   . Hx of adenomatous colonic polyps 12/14/2014    Past Surgical History  Procedure Laterality Date  . Knee arthroscopy Bilateral 2008, 2012    Dr.Aplington bilateral  . Tonsillectomy    . Transurethral resection of prostate       BPH;Dr Dahlstedt  . Cystoscopy      Neg  . Cholecystectomy  05/16/2011     Dr Marlou Starks  . Colonoscopy      Dr Sharlett Iles    Social History  Substance Use Topics  . Smoking status: Former Smoker -- 2.00 packs/day for 40 years    Types: Cigarettes    Quit date: 08/18/1994  . Smokeless tobacco: Never Used      Comment: smoked 1956-1996, up to 2 ppd  . Alcohol Use: 1.2 oz/week    2 Cans of beer per week     Comment: Occasional ber/wine.    Family History  Problem Relation Age of Onset  . Pancreatic cancer Father 85  . Ulcers Father   . Stroke Father 27  . Diabetes Father   . Coronary artery disease Mother   . Osteoporosis Mother   . HIV Brother   . Multiple sclerosis Sister   . Heart attack Paternal Grandfather     early 80s  . Heart attack Paternal Uncle     2 uncles in early 38s  . Aortic aneurysm Brother   . Colon cancer Neg Hx   . Esophageal cancer Neg Hx   . Stomach cancer Neg Hx     Allergies  Allergen Reactions  . Amlodipine Besy-Benazepril Hcl     REACTION: swelling of feet.  Probably  from Amlodipine component)  . Lipitor [Atorvastatin Calcium]     ? Reaction, ? swelling    Medication list has been reviewed and updated.  Current Outpatient Prescriptions on File Prior to Visit  Medication Sig Dispense Refill  . acetaminophen (TYLENOL) 325 MG tablet Per bottle as needed    . albuterol (PROVENTIL HFA;VENTOLIN HFA) 108 (90 Base) MCG/ACT inhaler Inhale 2 puffs into the lungs every 6 (six) hours as needed for wheezing or shortness of breath. 1 Inhaler 0  . cholecalciferol (VITAMIN D) 1000 UNITS tablet Take 1,000 Units by mouth daily.      . Fluticasone Propionate (FLONASE NA) Place into the nose as needed.    . folic acid (FOLVITE) A999333 MCG tablet Take 400 mcg by mouth daily.      Marland Kitchen gabapentin (NEURONTIN) 300 MG capsule Take one cap in morning and two caps at bedtime 270 capsule 1  . hyoscyamine (LEVSIN) 0.125 MG tablet Take 0.125 mg by mouth every 4 (four) hours as needed.    Marland Kitchen losartan-hydrochlorothiazide (HYZAAR) 100-12.5 MG tablet Take 1 tablet by mouth daily.     . Omega-3 Fatty Acids (FISH OIL) 1200 MG CAPS Take 1,200 mg by mouth 2 (two) times daily.      . pantoprazole (PROTONIX) 40 MG tablet Take 1 tablet (40 mg total) by mouth daily. 90 tablet 1  . propranolol  (INDERAL) 40 MG tablet Take 1 tablet (40 mg total) by mouth 2 (  two) times daily. 180 tablet 3  . warfarin (COUMADIN) 5 MG tablet TAKE AS DIRECTED BY ANTICOAGULATION CLINIC 90 tablet 1   No current facility-administered medications on file prior to visit.    Review of Systems:  As per HPI- otherwise negative. No fever or sore throat   Physical Examination: Filed Vitals:   01/17/16 1115  BP: 110/70  Pulse: 71  Temp: 97.6 F (36.4 C)   Filed Vitals:   01/17/16 1115  Height: 5\' 10"  (1.778 m)  Weight: 212 lb (96.163 kg)   Body mass index is 30.42 kg/(m^2). Ideal Body Weight: Weight in (lb) to have BMI = 25: 173.9  GEN: WDWN, NAD, Non-toxic, A & O x 3, looks well, overweight HEENT: Atraumatic, Normocephalic. Neck supple. No masses, No LAD.  Bilateral TM wnl, oropharynx normal.  PEERL,EOMI.   Ears and Nose: No external deformity. He has a small area of tenderness in the left trapezius- otherwise his neck is now non-tender.  Normal flexion, extension and rotation bilaterally.  No meningismus CV: RRR, No M/G/R. No JVD. No thrill. No extra heart sounds. PULM: CTA B, no wheezes, crackles, rhonchi. No retractions. No resp. distress. No accessory muscle use.Marland Kitchen EXTR: No c/c/e.  Normal strength and ROM of both arms No rashes NEURO Normal gait.  PSYCH: Normally interactive. Conversant. Not depressed or anxious appearing.  Calm demeanor.   Assessment and Plan: Neck muscle spasm - Plan: diazepam (VALIUM) 5 MG tablet, DISCONTINUED: diazepam (VALIUM) 5 MG tablet  Essential hypertension  BP is under control.  He will keep an eye on this and let me know if any concerns Refilled his valium- however as his neck pain is now much better he will use this only if needed Cautioned regarding sedation and combination with other meds  Signed Lamar Blinks, MD

## 2016-01-17 NOTE — Progress Notes (Signed)
Pre visit review using our clinic review tool, if applicable. No additional management support is needed unless otherwise documented below in the visit note. 

## 2016-01-17 NOTE — Patient Instructions (Addendum)
Use the valium if needed for neck pain- remember this will make you sleepy especially with your neurontin medication.  [place] it sparingly and stick to a 1/2 tablet unless a whole is absolutely necessary Let me know if you have any concerning changes in your home blood pressure readings!

## 2016-01-18 ENCOUNTER — Ambulatory Visit (INDEPENDENT_AMBULATORY_CARE_PROVIDER_SITE_OTHER): Payer: Medicare Other | Admitting: General Practice

## 2016-01-18 DIAGNOSIS — K55069 Acute infarction of intestine, part and extent unspecified: Secondary | ICD-10-CM

## 2016-01-18 DIAGNOSIS — I81 Portal vein thrombosis: Secondary | ICD-10-CM

## 2016-01-18 DIAGNOSIS — Z5181 Encounter for therapeutic drug level monitoring: Secondary | ICD-10-CM

## 2016-01-18 DIAGNOSIS — Z86718 Personal history of other venous thrombosis and embolism: Secondary | ICD-10-CM | POA: Diagnosis not present

## 2016-01-18 DIAGNOSIS — D6851 Activated protein C resistance: Secondary | ICD-10-CM

## 2016-01-18 LAB — POCT INR: INR: 2.4

## 2016-01-18 NOTE — Progress Notes (Signed)
Pre visit review using our clinic review tool, if applicable. No additional management support is needed unless otherwise documented below in the visit note. 

## 2016-01-18 NOTE — Progress Notes (Signed)
I have reviewed and agree with the plan. 

## 2016-01-21 ENCOUNTER — Ambulatory Visit (HOSPITAL_COMMUNITY)
Admission: RE | Admit: 2016-01-21 | Discharge: 2016-01-21 | Disposition: A | Discharge status: Home / Self Care | Payer: Medicare Other | Source: Ambulatory Visit | Attending: Pulmonary Disease | Admitting: Pulmonary Disease

## 2016-01-21 ENCOUNTER — Ambulatory Visit (INDEPENDENT_AMBULATORY_CARE_PROVIDER_SITE_OTHER)
Admission: RE | Admit: 2016-01-21 | Discharge: 2016-01-21 | Disposition: A | Discharge status: Home / Self Care | Payer: Medicare Other | Source: Ambulatory Visit | Attending: Pulmonary Disease | Admitting: Pulmonary Disease

## 2016-01-21 DIAGNOSIS — J849 Interstitial pulmonary disease, unspecified: Secondary | ICD-10-CM

## 2016-01-21 LAB — PULMONARY FUNCTION TEST
DL/VA % pred: 68 %
DL/VA: 3.13 ml/min/mmHg/L
DLCO UNC % PRED: 61 %
DLCO unc: 19.99 ml/min/mmHg
FEF 25-75 Post: 3.19 L/sec
FEF 25-75 Pre: 2.62 L/sec
FEF2575-%CHANGE-POST: 21 %
FEF2575-%PRED-POST: 158 %
FEF2575-%Pred-Pre: 130 %
FEV1-%CHANGE-POST: 4 %
FEV1-%PRED-PRE: 94 %
FEV1-%Pred-Post: 98 %
FEV1-POST: 2.86 L
FEV1-PRE: 2.74 L
FEV1FVC-%CHANGE-POST: -1 %
FEV1FVC-%PRED-PRE: 112 %
FEV6-%Change-Post: 6 %
FEV6-%Pred-Post: 94 %
FEV6-%Pred-Pre: 89 %
FEV6-PRE: 3.37 L
FEV6-Post: 3.59 L
FEV6FVC-%PRED-PRE: 107 %
FEV6FVC-%Pred-Post: 107 %
FVC-%Change-Post: 6 %
FVC-%PRED-POST: 88 %
FVC-%PRED-PRE: 83 %
FVC-POST: 3.59 L
FVC-PRE: 3.37 L
POST FEV1/FVC RATIO: 80 %
PRE FEV6/FVC RATIO: 100 %
Post FEV6/FVC ratio: 100 %
Pre FEV1/FVC ratio: 81 %
RV % PRED: 100 %
RV: 2.65 L
TLC % pred: 84 %
TLC: 5.99 L

## 2016-01-21 MED ORDER — ALBUTEROL SULFATE (2.5 MG/3ML) 0.083% IN NEBU
2.5000 mg | INHALATION_SOLUTION | Freq: Once | RESPIRATORY_TRACT | Status: AC
Start: 2016-01-21 — End: 2016-01-21
  Administered 2016-01-21: 2.5 mg via RESPIRATORY_TRACT

## 2016-01-25 ENCOUNTER — Ambulatory Visit (INDEPENDENT_AMBULATORY_CARE_PROVIDER_SITE_OTHER): Payer: Medicare Other | Admitting: Pulmonary Disease

## 2016-01-25 ENCOUNTER — Encounter: Payer: Self-pay | Admitting: Pulmonary Disease

## 2016-01-25 VITALS — BP 142/80 | HR 68 | Ht 70.0 in | Wt 211.0 lb

## 2016-01-25 DIAGNOSIS — J849 Interstitial pulmonary disease, unspecified: Secondary | ICD-10-CM | POA: Diagnosis not present

## 2016-01-25 NOTE — Progress Notes (Signed)
Subjective:    Patient ID: Patrick Hatfield, male    DOB: 01-04-1935, 81 y.o.   MRN: [id number]  HPI Follow up for evaluation of interstitial lung disease.  Mr. Krahl is a 81 year old with past medical history of DVT/PE, factor V leidin mutation. He had an episode of pneumonia in March 2017 with cough, fevers and abnormal chest x-ray. He was treated with Omnicef, cough medication and inhaler. He is completely recovered now back to baseline and does not have any respiratory complaints. The chest x-ray at that time showed some interstitial prominence suggestive of ILD. He has been referred here for further follow-up.  He has a diagnosis of asthma in the past but is not on any inhalers. He had a DVT/PE in 1991 and mesenteric thrombus in 2010. He was diagnosed with factor V Leiden mutation and is on indefinite anticoagulation with coumadin. He has significant issues with reflux but is well controlled on Protonix daily. 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 He has a significant smoking history. Quit in 1996. 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 He has 2 dogs as pets, no birds. He has a hot tub but does not use it. No mold issues at home.  DATA: CXR 11/28/15 No pneumonia. Peribronchial thickening may indicate bronchitis.  CXR 11/14/15 B/L interstitial infiltrates.  CT scan 01/21/16 No evidence of interstitial lung disease, coronary atherosclerosis.  PFTs 01/21/16 FVC 3.37 (82%) FEV1 2.74 [94%) F/F 81 TLC 84% DLCO 61%. Moderate reduction in diffusion capacity which partially corrects for alveolar volume.  Social History: 80-pack-year smoking history. Quit in 1996. Occasional alcohol use, no drug use.  Family History: Mother-coronary artery disease, osteoporosis. Father-diabetes, pancreatic cancer, stroke, ulcers. Brother-aortic aneurysm.  Past Medical History  Diagnosis Date  . Proteinuria age 14    . Gilbert's syndrome   . Benign essential tremor   . Colitis in 20's  . Depression 2006    drug overdose  . Personal history of colonic polyps 05/28/2007    adenomatous  . Insomnia   . 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 Esophageal reflux   . DVT (deep venous thrombosis) ([place]) 1991 & 2010    X 2 in context Factor 5 def  . Diverticulosis   . Hiatal hernia   . Umbilical hernia   . H/O ischemic bowel disease 2010    while off warfarin   . Factor 5 Leiden mutation, heterozygous ([place])   . Herpes zoster 2010    facial  . 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 Colon polyps   . Barrett's esophagus   . Anxiety   . Arthritis     KNEES,ANKLES,HANDS  . Asthma     AS A TEENAGER  . Clotting disorder ([place])     FACTOR 5  . Hyperlipidemia   . Hypertension   . Osteoporosis   . Hx of adenomatous colonic polyps 12/14/2014    Current outpatient prescriptions:  .  acetaminophen (TYLENOL) 325 MG tablet, Per bottle as needed, Disp: , Rfl:  .  cholecalciferol (VITAMIN D) 1000 UNITS tablet, Take 1,000 Units by mouth daily.  , Disp: , Rfl:  .  diazepam (VALIUM) 5 MG tablet, Take a 1/2 or a whole tablet every 8 hours as needed for muscle spasm, Disp: 20 tablet, Rfl: 0 .  Fluticasone Propionate (  FLONASE NA), Place into the nose as needed., Disp: , Rfl:  .  folic acid (FOLVITE) A999333 MCG tablet, Take 400 mcg by mouth daily.  , Disp: , Rfl:  .  gabapentin (NEURONTIN) 300 MG capsule, Take one cap in morning and two caps at bedtime, Disp: 270 capsule, Rfl: 1 .  hyoscyamine (LEVSIN) 0.125 MG tablet, Take 0.125 mg by mouth every 4 (four) hours as needed., Disp: , Rfl:  .  losartan-hydrochlorothiazide (HYZAAR) 100-12.5 MG tablet, Take 1 tablet by mouth daily. , Disp: , Rfl:  .  Omega-3 Fatty Acids (FISH OIL) 1200 MG CAPS, Take 1,200 mg by mouth 2 (two) times daily.  , Disp: , Rfl:  .  pantoprazole (PROTONIX) 40 MG tablet, Take 1 tablet (40 mg total) by mouth  daily., Disp: 90 tablet, Rfl: 1 .  propranolol (INDERAL) 40 MG tablet, Take 1 tablet (40 mg total) by mouth 2 (two) times daily., Disp: 180 tablet, Rfl: 3 .  warfarin (COUMADIN) 5 MG tablet, TAKE AS DIRECTED BY ANTICOAGULATION CLINIC, Disp: 90 tablet, Rfl: 1  Review of Systems No cough, some wheezing, sputum production, dyspnea, hemoptysis. No chest pain, palpitation. No nausea, vomiting, diarrhea, constipation. No joint pain, rash, joint stiffness, tenderness. No fevers, chills, loss of weight, loss of appetite. All other review of systems are negative.    Objective:   Physical Exam Blood pressure 142/80, pulse 68, height 5\' 10"  (1.778 m), weight 211 lb (95.709 kg), SpO2 96 %. Gen: No apparent distress Neuro: No gross focal deficits. HEENT: No JVD, lymphadenopathy, thyromegaly. RS: Clear, No wheeze or crackles CVS: S1-S2 heard, no murmurs rubs gallops. Abdomen: Soft, positive bowel sounds. Musculoskeletal: No edema.    Assessment & Plan:  Evaluation for ILD. I reviewed his CTwhich does not show ILD. PFTs shows isolated reduction in diffusion capacity that partially corrects for alveolar volume. He's had remote exposure to asbestos and a smoking history but no other exposures or signs, symptoms suggestive of connective tissue, autoimmune disease. He does not have any respiratory symptoms today.  I reassured him that there does not seem to be any issues with the lung. He does have coronary atherosclerotic calcification and advised him to follow up with his PCP Dr. Lorelei Pont for this.  He does not need a routine follow up with [place] but will call if there is any change in resp symptoms.  Return to clinic PRN  Marshell Garfinkel MD [facility] Pager [phone] If no answer or after 3pm call: [phone] 01/25/2016, 10:19 AM

## 2016-01-25 NOTE — Patient Instructions (Signed)
Follow up with Dr. Lorelei Pont

## 2016-02-15 ENCOUNTER — Ambulatory Visit (INDEPENDENT_AMBULATORY_CARE_PROVIDER_SITE_OTHER): Payer: Medicare Other | Admitting: General Practice

## 2016-02-15 DIAGNOSIS — Z86718 Personal history of other venous thrombosis and embolism: Secondary | ICD-10-CM | POA: Diagnosis not present

## 2016-02-15 DIAGNOSIS — K55069 Acute infarction of intestine, part and extent unspecified: Secondary | ICD-10-CM

## 2016-02-15 DIAGNOSIS — D6851 Activated protein C resistance: Secondary | ICD-10-CM | POA: Diagnosis not present

## 2016-02-15 DIAGNOSIS — Z5181 Encounter for therapeutic drug level monitoring: Secondary | ICD-10-CM

## 2016-02-15 DIAGNOSIS — I81 Portal vein thrombosis: Secondary | ICD-10-CM

## 2016-02-15 LAB — POCT INR: INR: 2.1

## 2016-02-15 NOTE — Progress Notes (Signed)
Pre visit review using our clinic review tool, if applicable. No additional management support is needed unless otherwise documented below in the visit note. 

## 2016-02-15 NOTE — Progress Notes (Signed)
I have reviewed and agree with the plan. 

## 2016-03-02 ENCOUNTER — Encounter: Payer: Self-pay | Admitting: Family Medicine

## 2016-03-04 ENCOUNTER — Ambulatory Visit: Payer: Medicare Other | Admitting: Internal Medicine

## 2016-03-04 MED ORDER — GABAPENTIN 300 MG PO CAPS: ORAL_CAPSULE | ORAL | Status: DC

## 2016-03-28 ENCOUNTER — Ambulatory Visit (INDEPENDENT_AMBULATORY_CARE_PROVIDER_SITE_OTHER): Payer: Medicare Other | Admitting: General Practice

## 2016-03-28 DIAGNOSIS — D6851 Activated protein C resistance: Secondary | ICD-10-CM | POA: Diagnosis not present

## 2016-03-28 DIAGNOSIS — Z5181 Encounter for therapeutic drug level monitoring: Secondary | ICD-10-CM | POA: Diagnosis not present

## 2016-03-28 DIAGNOSIS — K55069 Acute infarction of intestine, part and extent unspecified: Secondary | ICD-10-CM

## 2016-03-28 DIAGNOSIS — Z86718 Personal history of other venous thrombosis and embolism: Secondary | ICD-10-CM

## 2016-03-28 DIAGNOSIS — I81 Portal vein thrombosis: Secondary | ICD-10-CM | POA: Diagnosis not present

## 2016-03-28 LAB — POCT INR: INR: 2.6

## 2016-03-28 NOTE — Progress Notes (Signed)
I have reviewed and agree with the plan. 

## 2016-04-09 ENCOUNTER — Telehealth: Payer: Self-pay

## 2016-04-09 ENCOUNTER — Encounter: Payer: Self-pay | Admitting: Family Medicine

## 2016-04-09 DIAGNOSIS — M62838 Other muscle spasm: Secondary | ICD-10-CM

## 2016-04-09 NOTE — Telephone Encounter (Signed)
Message routed to [place].

## 2016-04-09 NOTE — Telephone Encounter (Signed)
Patient is on the list for [employer] 2017 and may be a good candidate for an AWV in 2017. Please let me know if/when appt is scheduled.   

## 2016-04-11 MED ORDER — DIAZEPAM 5 MG PO TABS: ORAL_TABLET | ORAL | 0 refills | Status: DC

## 2016-04-11 NOTE — Addendum Note (Signed)
Addended by: Lamar Blinks C on: 04/11/2016 04:07 PM   Modules accepted: Orders

## 2016-04-24 ENCOUNTER — Encounter: Payer: Self-pay | Admitting: Family Medicine

## 2016-04-25 NOTE — Telephone Encounter (Signed)
Noted. Please make sure pt at least schedules CPE with PCP. He is overdue per chart review.

## 2016-04-25 NOTE — Telephone Encounter (Signed)
Patient informed. 

## 2016-04-25 NOTE — Telephone Encounter (Signed)
As per patient not interested and having medicare wellness.

## 2016-05-09 ENCOUNTER — Ambulatory Visit (INDEPENDENT_AMBULATORY_CARE_PROVIDER_SITE_OTHER): Payer: Medicare Other | Admitting: General Practice

## 2016-05-09 DIAGNOSIS — Z5181 Encounter for therapeutic drug level monitoring: Secondary | ICD-10-CM

## 2016-05-09 DIAGNOSIS — Z86718 Personal history of other venous thrombosis and embolism: Secondary | ICD-10-CM

## 2016-05-09 DIAGNOSIS — D6851 Activated protein C resistance: Secondary | ICD-10-CM

## 2016-05-09 DIAGNOSIS — Z23 Encounter for immunization: Secondary | ICD-10-CM | POA: Diagnosis not present

## 2016-05-09 DIAGNOSIS — I81 Portal vein thrombosis: Secondary | ICD-10-CM

## 2016-05-09 DIAGNOSIS — K55069 Acute infarction of intestine, part and extent unspecified: Secondary | ICD-10-CM

## 2016-05-09 LAB — POCT INR: INR: 2

## 2016-05-09 NOTE — Progress Notes (Signed)
I have reviewed and agree with the plan. 

## 2016-05-22 ENCOUNTER — Encounter: Payer: Self-pay | Admitting: Family Medicine

## 2016-06-20 ENCOUNTER — Ambulatory Visit (INDEPENDENT_AMBULATORY_CARE_PROVIDER_SITE_OTHER): Payer: Medicare Other | Admitting: General Practice

## 2016-06-20 DIAGNOSIS — K55069 Acute infarction of intestine, part and extent unspecified: Secondary | ICD-10-CM

## 2016-06-20 DIAGNOSIS — D6851 Activated protein C resistance: Secondary | ICD-10-CM | POA: Diagnosis not present

## 2016-06-20 DIAGNOSIS — I81 Portal vein thrombosis: Secondary | ICD-10-CM

## 2016-06-20 DIAGNOSIS — Z5181 Encounter for therapeutic drug level monitoring: Secondary | ICD-10-CM | POA: Diagnosis not present

## 2016-06-20 DIAGNOSIS — Z86718 Personal history of other venous thrombosis and embolism: Secondary | ICD-10-CM

## 2016-06-20 LAB — POCT INR: INR: 2.3

## 2016-06-20 NOTE — Progress Notes (Signed)
I have reviewed and agree with the plan. 

## 2016-06-20 NOTE — Patient Instructions (Signed)
Pre visit review using our clinic review tool, if applicable. No additional management support is needed unless otherwise documented below in the visit note. 

## 2016-07-23 DIAGNOSIS — G8929 Other chronic pain: Secondary | ICD-10-CM | POA: Diagnosis not present

## 2016-07-23 DIAGNOSIS — M170 Bilateral primary osteoarthritis of knee: Secondary | ICD-10-CM | POA: Diagnosis not present

## 2016-07-23 DIAGNOSIS — M25561 Pain in right knee: Secondary | ICD-10-CM | POA: Diagnosis not present

## 2016-07-23 DIAGNOSIS — M25562 Pain in left knee: Secondary | ICD-10-CM | POA: Diagnosis not present

## 2016-07-24 ENCOUNTER — Encounter: Payer: Self-pay | Admitting: Family Medicine

## 2016-07-24 MED ORDER — WARFARIN SODIUM 5 MG PO TABS: ORAL_TABLET | ORAL | 3 refills | Status: DC

## 2016-07-31 ENCOUNTER — Other Ambulatory Visit: Payer: Self-pay | Admitting: Family Medicine

## 2016-07-31 DIAGNOSIS — M62838 Other muscle spasm: Secondary | ICD-10-CM

## 2016-08-01 ENCOUNTER — Ambulatory Visit (INDEPENDENT_AMBULATORY_CARE_PROVIDER_SITE_OTHER): Payer: Medicare Other | Admitting: General Practice

## 2016-08-01 ENCOUNTER — Other Ambulatory Visit: Payer: Self-pay | Admitting: Emergency Medicine

## 2016-08-01 DIAGNOSIS — Z86718 Personal history of other venous thrombosis and embolism: Secondary | ICD-10-CM

## 2016-08-01 DIAGNOSIS — Z5181 Encounter for therapeutic drug level monitoring: Secondary | ICD-10-CM

## 2016-08-01 DIAGNOSIS — D6851 Activated protein C resistance: Secondary | ICD-10-CM | POA: Diagnosis not present

## 2016-08-01 DIAGNOSIS — I81 Portal vein thrombosis: Secondary | ICD-10-CM

## 2016-08-01 DIAGNOSIS — M62838 Other muscle spasm: Secondary | ICD-10-CM

## 2016-08-01 DIAGNOSIS — K55069 Acute infarction of intestine, part and extent unspecified: Secondary | ICD-10-CM

## 2016-08-01 LAB — POCT INR: INR: 2.2

## 2016-08-01 MED ORDER — DIAZEPAM 5 MG PO TABS: ORAL_TABLET | ORAL | 0 refills | Status: DC

## 2016-08-01 NOTE — Telephone Encounter (Signed)
Received refill request for DIAZEPAM 5 MG TABLET. Last office visit 01/17/2016 and last refill 04/11/16. Is it ok to refill? Please advise.

## 2016-08-01 NOTE — Patient Instructions (Signed)
Pre visit review using our clinic review tool, if applicable. No additional management support is needed unless otherwise documented below in the visit note. 

## 2016-08-03 NOTE — Progress Notes (Signed)
I have reviewed and agree with the plan. 

## 2016-08-15 ENCOUNTER — Encounter: Payer: Self-pay | Admitting: Family Medicine

## 2016-08-20 ENCOUNTER — Ambulatory Visit (INDEPENDENT_AMBULATORY_CARE_PROVIDER_SITE_OTHER): Payer: Medicare Other | Admitting: Family Medicine

## 2016-08-20 VITALS — BP 112/70 | HR 66 | Temp 97.9°F | Ht 70.0 in | Wt 216.8 lb

## 2016-08-20 DIAGNOSIS — R252 Cramp and spasm: Secondary | ICD-10-CM

## 2016-08-20 DIAGNOSIS — I10 Essential (primary) hypertension: Secondary | ICD-10-CM | POA: Diagnosis not present

## 2016-08-20 LAB — CBC
HEMATOCRIT: 46.2 % (ref 39.0–52.0)
Hemoglobin: 16.4 g/dL (ref 13.0–17.0)
MCHC: 35.4 g/dL (ref 30.0–36.0)
MCV: 94.5 fl (ref 78.0–100.0)
Platelets: 127 10*3/uL — ABNORMAL LOW (ref 150.0–400.0)
RBC: 4.89 Mil/uL (ref 4.22–5.81)
RDW: 14.6 % (ref 11.5–15.5)
WBC: 6.4 10*3/uL (ref 4.0–10.5)

## 2016-08-20 LAB — COMPREHENSIVE METABOLIC PANEL
ALT: 72 U/L — AB (ref 0–53)
AST: 44 U/L — ABNORMAL HIGH (ref 0–37)
Albumin: 4.6 g/dL (ref 3.5–5.2)
Alkaline Phosphatase: 49 U/L (ref 39–117)
BILIRUBIN TOTAL: 2.4 mg/dL — AB (ref 0.2–1.2)
BUN: 24 mg/dL — ABNORMAL HIGH (ref 6–23)
CALCIUM: 9.4 mg/dL (ref 8.4–10.5)
CO2: 29 mEq/L (ref 19–32)
Chloride: 97 mEq/L (ref 96–112)
Creatinine, Ser: 0.98 mg/dL (ref 0.40–1.50)
GFR: 78.25 mL/min (ref >60.00)
GLUCOSE: 122 mg/dL — AB (ref 70–99)
POTASSIUM: 3.9 meq/L (ref 3.5–5.1)
Sodium: 134 mEq/L — ABNORMAL LOW (ref 135–145)
TOTAL PROTEIN: 7.9 g/dL (ref 6.0–8.3)

## 2016-08-20 LAB — FERRITIN: FERRITIN: 669 ng/mL — AB (ref 22.0–322.0)

## 2016-08-20 LAB — MAGNESIUM: Magnesium: 1.7 mg/dL (ref 1.5–2.5)

## 2016-08-20 NOTE — Progress Notes (Signed)
. [place] at [facility] [address], [place], [place] [address] [phone]  Date:  08/20/2016   Name:  Patrick Hatfield   DOB:  25-Feb-1935   MRN:  [id number]  PCP:  Lamar Blinks, MD    Chief Complaint: Leg Cramps   History of Present Illness:  Patrick Hatfield is a 82 y.o. very pleasant male patient who presents with the following:  Here today to discuss leg cramps. Last seen by myself in June with neck spasms- this seemed due to a simple crick in the neck/ torticollis and resolved.    His wife is doing well with her CLL- she has completed 6 months of treatment, she is done with chemo and they are monitoring her once a month.     He has noted leg cramps for about 3 months. They can be severe most often around 2-3 am and can wake him from sleep/ can be very painful and hard to stretch out.  Will most often occur down the front of his shin.  More in the left, but sometimes in the right or both legs.   He is not aware of any change in his life, no new exercise program, etc.  He did some research online and started some OTC supplements as below    He has a history of factor 5 leiden, HTN.  He is on coumadin, eurontin (he takes just on occasion as needed), hyzaar, occasional diazepam- once or twice a week  He has tried adding potassium, calcium, magnesium and zinc supplements.  He started these 4 days ago and his cramps have subsided.  However he decided to keep his appt to make sure all is well  Noted soft BP today- he does check his BP at home and his home BPs are 130-140/ 70s.  Therefore will keep his current dose of medication for now  Lab Results  Component Value Date   INR 2.2 08/01/2016   INR 2.3 06/20/2016   INR 2.0 05/09/2016   PROTIME 22.8 (H) 07/11/2013   PROTIME 30.0 (H) 07/13/2012   PROTIME 16.8 (H) 05/19/2011   He may have an occasional nose bleed during the "dry weather" but this is easy to control. He has easy  busing but this is stable.    BP Readings from Last 3 Encounters:  08/20/16 112/70  01/25/16 (!) 142/80  01/17/16 110/70   He has not had any prior LE swelling except as related to his DVTs.   His home BPs are 130-140/ 70s.   He is not fasting today His immunizations are UTD except for zostavax- he does not want to have this done today however He had shingles in 2010.   Patient Active Problem List   Diagnosis Date Noted  . Insomnia 09/05/2015  . Frequent loose stools 09/05/2015  . Hx of adenomatous colonic polyps 12/14/2014  . Hematuria, gross 02/03/2014  . 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 Thrombocytopenia, unspecified 01/05/2013  . Gilbert syndrome 01/05/2013  . Unspecified adverse effect of unspecified drug, medicinal and biological substance 01/09/2012  . Barrett's esophagus 03/24/2011  . Factor 5 Leiden mutation, heterozygous ([place]) 03/21/2011  . Essential tremor 04/24/2009  . HYPERLIPIDEMIA 10/03/2008  . Essential hypertension 10/03/2008  . GERD 10/03/2008  . Fasting hyperglycemia 10/03/2008  . DVT, HX OF 10/03/2008    Past Medical History:  Diagnosis Date  . Anxiety   . Arthritis    KNEES,ANKLES,HANDS  . Asthma  AS A TEENAGER  . Barrett's esophagus   . Benign essential tremor   . Clotting disorder ([place])    FACTOR 5  . Colitis in 20's  . Colon polyps   . Congenital deficiency of other clotting factors ([facility])    factor 5  . Depression 2006   drug overdose  . Diverticulosis   . DVT (deep venous thrombosis) ([place]) 1991 & 2010   X 2 in context Factor 5 def  . Esophageal reflux   . Factor 5 Leiden mutation, heterozygous ([place])   . Gilbert's syndrome   . H/O ischemic bowel disease 2010   while off warfarin   . Herpes zoster 2010   facial  . Hiatal hernia   . Hx of adenomatous colonic polyps 12/14/2014  . Hyperlipidemia   . Hypertension   . Insomnia   . Osteoporosis   . Personal history of colonic polyps 05/28/2007   adenomatous  .  Proteinuria age 29  . 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 Umbilical hernia     Past Surgical History:  Procedure Laterality Date  . CHOLECYSTECTOMY  05/16/2011    Dr Marlou Starks  . COLONOSCOPY     Dr Sharlett Iles  . CYSTOSCOPY     Neg  . KNEE ARTHROSCOPY Bilateral 2008, 2012   Dr.Aplington bilateral  . TONSILLECTOMY    . TRANSURETHRAL RESECTION OF PROSTATE      BPH;Dr Dahlstedt    Social History  Substance Use Topics  . Smoking status: Former Smoker    Packs/day: 2.00    Years: 40.00    Types: Cigarettes    Quit date: 08/18/1994  . Smokeless tobacco: Never Used     Comment: smoked 1956-1996, up to 2 ppd  . Alcohol use 1.2 oz/week    2 Cans of beer per week     Comment: Occasional ber/wine.    Family History  Problem Relation Age of Onset  . Pancreatic cancer Father 6  . Ulcers Father   . Stroke Father 69  . Diabetes Father   . Coronary artery disease Mother   . Osteoporosis Mother   . HIV Brother   . Multiple sclerosis Sister   . Heart attack Paternal Grandfather     early 93s  . Heart attack Paternal Uncle     2 uncles in early 64s  . Aortic aneurysm Brother   . Colon cancer Neg Hx   . Esophageal cancer Neg Hx   . Stomach cancer Neg Hx     Allergies  Allergen Reactions  . Amlodipine Besy-Benazepril Hcl     REACTION: swelling of feet.  Probably  from Amlodipine component)  . Lipitor [Atorvastatin Calcium]     ? Reaction, ? swelling    Medication list has been reviewed and updated.  Current Outpatient Prescriptions on File Prior to Visit  Medication Sig Dispense Refill  . acetaminophen (TYLENOL) 325 MG tablet Per bottle as needed    . cholecalciferol (VITAMIN D) 1000 UNITS tablet Take 1,000 Units by mouth daily.      . diazepam (VALIUM) 5 MG tablet Take a 1/2 or a whole tablet at bedtime as needed for sleep.  Use as little as possible 30 tablet 0  . Fluticasone Propionate (FLONASE NA) Place into the nose as needed.    . folic acid (FOLVITE)  A999333 MCG tablet Take 400 mcg by mouth daily.      Marland Kitchen gabapentin (NEURONTIN) 300 MG capsule Take 1 or 2 at bedtime as  needed 180 capsule 0  . hyoscyamine (LEVSIN) 0.125 MG tablet Take 0.125 mg by mouth every 4 (four) hours as needed.    Marland Kitchen losartan-hydrochlorothiazide (HYZAAR) 100-12.5 MG tablet Take 1 tablet by mouth daily.     . Omega-3 Fatty Acids (FISH OIL) 1200 MG CAPS Take 1,200 mg by mouth 2 (two) times daily.      . pantoprazole (PROTONIX) 40 MG tablet Take 1 tablet (40 mg total) by mouth daily. 90 tablet 1  . propranolol (INDERAL) 40 MG tablet Take 1 tablet (40 mg total) by mouth 2 (two) times daily. 180 tablet 3  . warfarin (COUMADIN) 5 MG tablet TAKE AS DIRECTED BY ANTICOAGULATION CLINIC 120 tablet 3   No current facility-administered medications on file prior to visit.     Review of Systems:  As per HPI- otherwise negative. No fever, malaise, or SOB   Physical Examination: Vitals:   08/20/16 1112  BP: 112/70  Pulse: 66  Temp: 97.9 F (36.6 C)   Vitals:   08/20/16 1112  Weight: 216 lb 12.8 oz (98.3 kg)  Height: 5\' 10"  (1.778 m)   Body mass index is 31.11 kg/m. Ideal Body Weight: Weight in (lb) to have BMI = 25: 173.9  GEN: WDWN, NAD, Non-toxic, A & O x 3, overweight, looks well HEENT: Atraumatic, Normocephalic. Neck supple. No masses, No LAD. Ears and Nose: No external deformity. CV: RRR, No M/G/R. No JVD. No thrill. No extra heart sounds. PULM: CTA B, no wheezes, crackles, rhonchi. No retractions. No resp. distress. No accessory muscle use. ABD: S, NT, ND, +BS. No rebound. No HSM. EXTR: No c/c/e NEURO Normal gait.  PSYCH: Normally interactive. Conversant. Not depressed or anxious appearing.  Calm demeanor.  No tenderness to palpation in the shin area.  Normal strength of BUE and BLE Dry skin on lower legs but otherwise no acute findings. No swelling or cords   Assessment and Plan: Muscle cramps - Plan: Comprehensive metabolic panel, Ferritin, Magnesium, CBC,  Zinc  Essential hypertension   Here today with muscle cramps.  However he has started a few supplements OTC as above and his sx are improved.  Will check levels for him He declines zostavax for the time being Will plan further follow- up pending labs.  Consider stopping HCTZ due to cramps but will continue for now given his home BP readings    Signed Lamar Blinks, MD

## 2016-08-20 NOTE — Progress Notes (Signed)
Pre visit review using our clinic review tool, if applicable. No additional management support is needed unless otherwise documented below in the visit note. 

## 2016-08-20 NOTE — Patient Instructions (Signed)
We will check labs for you today- I will be in touch with your results.

## 2016-08-22 ENCOUNTER — Telehealth: Payer: Self-pay | Admitting: Emergency Medicine

## 2016-08-22 ENCOUNTER — Other Ambulatory Visit: Payer: Medicare Other

## 2016-08-22 DIAGNOSIS — R252 Cramp and spasm: Secondary | ICD-10-CM

## 2016-08-22 LAB — ZINC

## 2016-08-22 NOTE — Telephone Encounter (Signed)
Pt called back. Scheduled lab appt for today to recollect blood for zinc test.

## 2016-08-22 NOTE — Telephone Encounter (Signed)
Lab called and said pt will need to come back in to have zinc test recollected. Called pt to schedule him a lab only visit to redraw zinc test. No answer, left message for pt to call back and schedule lab only appt.

## 2016-08-22 NOTE — Addendum Note (Signed)
Addended by: Emi Holes on: 08/22/2016 10:41 AM   Modules accepted: Orders

## 2016-08-24 LAB — ZINC: ZINC: 87 ug/dL (ref 60–130)

## 2016-08-27 ENCOUNTER — Encounter: Payer: Self-pay | Admitting: Family Medicine

## 2016-09-08 ENCOUNTER — Other Ambulatory Visit: Payer: Self-pay | Admitting: Internal Medicine

## 2016-09-12 ENCOUNTER — Ambulatory Visit (INDEPENDENT_AMBULATORY_CARE_PROVIDER_SITE_OTHER): Payer: Medicare Other | Admitting: General Practice

## 2016-09-12 DIAGNOSIS — Z5181 Encounter for therapeutic drug level monitoring: Secondary | ICD-10-CM

## 2016-09-12 DIAGNOSIS — D6851 Activated protein C resistance: Secondary | ICD-10-CM | POA: Diagnosis not present

## 2016-09-12 DIAGNOSIS — Z86718 Personal history of other venous thrombosis and embolism: Secondary | ICD-10-CM

## 2016-09-12 DIAGNOSIS — I81 Portal vein thrombosis: Secondary | ICD-10-CM

## 2016-09-12 DIAGNOSIS — K55069 Acute infarction of intestine, part and extent unspecified: Secondary | ICD-10-CM

## 2016-09-12 LAB — POCT INR: INR: 2.3

## 2016-09-12 NOTE — Patient Instructions (Signed)
Pre visit review using our clinic review tool, if applicable. No additional management support is needed unless otherwise documented below in the visit note. 

## 2016-09-25 NOTE — Progress Notes (Signed)
Pre visit review using our clinic review tool, if applicable. No additional management support is needed unless otherwise documented below in the visit note. 

## 2016-09-25 NOTE — Progress Notes (Addendum)
Subjective:   Patrick Hatfield is a 82 y.o. male who presents for Medicare Annual/Subsequent preventive examination.  Patient notes some ongoing abdominal bloating x8-9 months. He has not seen PCP for this, but has seen Dr. Carlean Purl for similar complaints in the past. "Usually all the time I have a feeling of being too full. It's an uncomfortable annoying feeling in my stomach."  His symptoms are unrelated to PO intake/food choices. He denies abd pain. He has 3 BMs on average daily, and frequent loose stools and gas-these are chronic issues for him. He recently cut out beer and reports the feeling of fullness has improved since doing so.  Review of Systems:  No ROS.  Medicare Wellness Visit.  Cardiac Risk Factors include: advanced age (>55men, >36 women);hypertension;obesity (BMI >30kg/m2);male gender  Sleep patterns: no sleep issues, feels rested on waking, gets up 1 times nightly to void and sleeps 5-6 hours nightly.   Home Safety/Smoke Alarms: Feels safe in home. Smoke alarms in place. Security system in place.    Living environment; residence and Firearm Safety: 2 bedroom single story townhouse. Lives w/ wife. firearms stored safely. Seat Belt Safety/Bike Helmet: Wears seat belt.   Counseling:   [facility] yearly  Dental- Does not go to dentist regularly, PRN only. Good dental hygiene practice in place.  Male:   CCS- last 12/18/14 w/ Dr. Silvano Rusk. 5 polyps removed, severe diverticulosis, redundant colon, otherwise normal. No further routine screening recommended due to pt's age.      PSA- Lab Results  Component Value Date   PSA 1.46 02/08/2014   PSA 1.59 01/05/2013   PSA 1.45 01/22/2011       Objective:    Vitals: BP 112/74 (BP Location: Right Arm, Patient Position: Sitting, Cuff Size: Normal)   Pulse (!) 56   Resp 16   Ht 5\' 10"  (1.778 m)   Wt 217 lb (98.4 kg)   SpO2 98%   BMI 31.14 kg/m   Body mass index is 31.14 kg/m.  Tobacco History    Smoking Status  . Former Smoker  . Packs/day: 2.00  . Years: 40.00  . Types: Cigarettes  . Quit date: 08/18/1994  Smokeless Tobacco  . Never Used    Comment: smoked 1956-1996, up to 2 ppd     Counseling given: Not Answered   Past Medical History:  Diagnosis Date  . Anxiety   . Arthritis    KNEES,ANKLES,HANDS  . Asthma    AS A TEENAGER  . Barrett's esophagus   . Benign essential tremor   . Clotting disorder ([place])    FACTOR 5  . Colitis in 20's  . Colon polyps   . Congenital deficiency of other clotting factors    factor 5  . Depression 2006   drug overdose  . Diverticulosis   . DVT (deep venous thrombosis) ([place]) 1991 & 2010   X 2 in context Factor 5 def  . Esophageal reflux   . Factor 5 Leiden mutation, heterozygous ([place])   . Gilbert's syndrome   . H/O ischemic bowel disease 2010   while off warfarin   . Herpes zoster 2010   facial  . Hiatal hernia   . Hx of adenomatous colonic polyps 12/14/2014  . Hyperlipidemia   . Hypertension   . Insomnia   . Osteoporosis   . Personal history of colonic polyps 05/28/2007   adenomatous  . Proteinuria age 13  . 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  Umbilical hernia    Past Surgical History:  Procedure Laterality Date  . CHOLECYSTECTOMY  05/16/2011    Dr Marlou Starks  . COLONOSCOPY     Dr Sharlett Iles  . CYSTOSCOPY     Neg  . KNEE ARTHROSCOPY Bilateral 2008, 2012   Dr.Aplington bilateral  . TONSILLECTOMY    . TRANSURETHRAL RESECTION OF PROSTATE      BPH;Dr Dahlstedt   Family History  Problem Relation Age of Onset  . Pancreatic cancer Father 70  . Ulcers Father   . Stroke Father 51  . Diabetes Father   . Coronary artery disease Mother   . Osteoporosis Mother   . HIV Brother   . Multiple sclerosis Sister   . Heart attack Paternal Grandfather     early 39s  . Heart attack Paternal Uncle     2 uncles in early 77s  . Aortic aneurysm Brother   . Colon cancer Neg Hx   . Esophageal cancer Neg Hx   .  Stomach cancer Neg Hx    History  Sexual Activity  . Sexual activity: Not on file    Outpatient Encounter Prescriptions as of 09/26/2016  Medication Sig  . acetaminophen (TYLENOL) 325 MG tablet Per bottle as needed  . cholecalciferol (VITAMIN D) 1000 UNITS tablet Take 1,000 Units by mouth daily.    . diazepam (VALIUM) 5 MG tablet Take a 1/2 or a whole tablet at bedtime as needed for sleep.  Use as little as possible  . Fluticasone Propionate (FLONASE NA) Place into the nose as needed.  . folic acid (FOLVITE) A999333 MCG tablet Take 400 mcg by mouth daily.    Marland Kitchen gabapentin (NEURONTIN) 300 MG capsule Take 1 or 2 at bedtime as needed  . losartan-hydrochlorothiazide (HYZAAR) 100-12.5 MG tablet Take 1 tablet by mouth daily.  . Omega-3 Fatty Acids (FISH OIL) 1200 MG CAPS Take 1,200 mg by mouth 2 (two) times daily.    . pantoprazole (PROTONIX) 40 MG tablet Take 1 tablet (40 mg total) by mouth daily.  . propranolol (INDERAL) 40 MG tablet Take 1 tablet (40 mg total) by mouth 2 (two) times daily.  Marland Kitchen warfarin (COUMADIN) 5 MG tablet TAKE AS DIRECTED BY ANTICOAGULATION CLINIC  . [DISCONTINUED] hyoscyamine (LEVSIN) 0.125 MG tablet Take 0.125 mg by mouth every 4 (four) hours as needed.   No facility-administered encounter medications on file as of 09/26/2016.     Activities of Daily Living In your present state of health, do you have any difficulty performing the following activities: 09/26/2016 10/09/2015  Hearing? N Y  Vision? N N  Difficulty concentrating or making decisions? N N  Walking or climbing stairs? N N  Dressing or bathing? N N  Doing errands, shopping? N N  Preparing Food and eating ? N -  Using the Toilet? N -  In the past six months, have you accidently leaked urine? N -  Do you have problems with loss of bowel control? N -  Managing your Medications? N -  Managing your Finances? N -  Housekeeping or managing your Housekeeping? N -  Some recent data might be hidden    Patient Care  Team: Darreld Mclean, MD as PCP - General (Family Medicine) Latanya Maudlin, MD as Consulting Physician (Orthopedic Surgery) Gatha Mayer, MD as Consulting Physician (Gastroenterology) Marshell Garfinkel, MD as Consulting Physician (Pulmonary Disease)   Assessment:    Physical assessment deferred to PCP.  Exercise Activities and Dietary recommendations Current Exercise Habits: Home exercise routine,  Type of exercise: walking, Frequency (Times/Week): 7, Exercise limited by: orthopedic condition(s) (chronic knee pain)  Diet (meal preparation, eat out, water intake, caffeinated beverages, dairy products, fruits and vegetables): in general, a "healthy" diet  , well balanced, on average, 2 meals per day . Regular diet, no fried foods. Enjoys cooking. Drinks water throughout the day.  Lunch: varies; sandwich or soup  Dinner: varies; 1-2 steamed vegetables, baked chicken/pork chop. Occasionally spaghetti w/ meat sauce.  Goals    . Maintain current health status      Fall Risk Fall Risk  09/26/2016 01/17/2016 10/09/2015 09/18/2014  Falls in the past year? No No No No   Depression Screen PHQ 2/9 Scores 09/26/2016 01/17/2016 10/09/2015 06/16/2015  PHQ - 2 Score 0 0 0 0    Cognitive Function MMSE - Mini Mental State Exam 09/26/2016  Orientation to time 4  Orientation to Place 5  Registration 3  Attention/ Calculation 5  Recall 3  Language- name 2 objects 2  Language- repeat 1  Language- follow 3 step command 3  Language- read & follow direction 1  Write a sentence 1  Copy design 1  Total score 29        Immunization History  Administered Date(s) Administered  . DT 02/16/2015  . Influenza Split 05/03/2012  . Influenza Whole 07/24/2010  . Influenza, High Dose Seasonal PF 05/20/2013, 05/09/2016  . Influenza,inj,Quad PF,36+ Mos 05/03/2014, 05/16/2015  . Pneumococcal Conjugate-13 09/05/2015  . Td 03/03/2015   Screening Tests Health Maintenance  Topic Date Due  . ZOSTAVAX  10/12/1996   . PNA vac Low Risk Adult (2 of 2 - PPSV23) 09/04/2016  . TETANUS/TDAP  03/02/2025  . INFLUENZA VACCINE  Completed      Plan:   Follow-up w/ Dr. Lorelei Pont as scheduled for CPE.  Pt states he will follow-up w/ Dr. Carlean Purl if any continuing issues w/ bloating.  Pneumovax given today.   During the course of the visit the patient was educated and counseled about the following appropriate screening and preventive services:   Vaccines to include Pneumoccal, Influenza, Hepatitis B, Td, Zostavax, HCV  Cardiovascular Disease  Colorectal cancer screening  Diabetes screening  Prostate Cancer Screening  Glaucoma screening  Nutrition counseling   Patient Instructions (the written plan) was given to the patient.    Dorrene German, RN  09/26/2016  Kathlene November, MD

## 2016-09-26 ENCOUNTER — Ambulatory Visit (INDEPENDENT_AMBULATORY_CARE_PROVIDER_SITE_OTHER): Payer: Medicare Other | Admitting: *Deleted

## 2016-09-26 ENCOUNTER — Encounter: Payer: Self-pay | Admitting: *Deleted

## 2016-09-26 VITALS — BP 112/74 | HR 56 | Resp 16 | Ht 70.0 in | Wt 217.0 lb

## 2016-09-26 DIAGNOSIS — Z23 Encounter for immunization: Secondary | ICD-10-CM | POA: Diagnosis not present

## 2016-09-26 DIAGNOSIS — Z0000 Encounter for general adult medical examination without abnormal findings: Secondary | ICD-10-CM | POA: Diagnosis not present

## 2016-09-26 NOTE — Patient Instructions (Addendum)
Mr. Puskarich , Thank you for taking time to come for your Medicare Wellness Visit. I appreciate your ongoing commitment to your health goals. Please review the following plan we discussed and let me know if I can assist you in the future.   Bring a copy of your advance directives to your next office visit. Happy birthday!!! You look great! I hope your granddaughter continues to recover well.  These are the goals we discussed: Goals    . Maintain current health status       This is a list of the screening recommended for you and due dates:  Health Maintenance  Topic Date Due  . Shingles Vaccine  10/12/1996  . Pneumonia vaccines (2 of 2 - PPSV23) 09/04/2016  . Tetanus Vaccine  03/02/2025  . Flu Shot  Completed   Pneumococcal Polysaccharide Vaccine: What You Need to Know 1. Why get vaccinated? Vaccination can protect older adults (and some children and younger adults) from pneumococcal disease. Pneumococcal disease is caused by bacteria that can spread from person to person through close contact. It can cause ear infections, and it can also lead to more serious infections of the:  Lungs (pneumonia),  Blood (bacteremia), and  Covering of the brain and spinal cord (meningitis). Meningitis can cause deafness and brain damage, and it can be fatal. Anyone can get pneumococcal disease, but children under 35 years of age, people with certain medical conditions, adults over 42 years of age, and cigarette smokers are at the highest risk. About 18,000 older adults die each year from pneumococcal disease in the [place]. Treatment of pneumococcal infections with penicillin and other drugs used to be more effective. But some strains of the disease have become resistant to these drugs. This makes prevention of the disease, through vaccination, even more important. 2. Pneumococcal polysaccharide vaccine (PPSV23) Pneumococcal polysaccharide vaccine (PPSV23) protects against 23 types of  pneumococcal bacteria. It will not prevent all pneumococcal disease. PPSV23 is recommended for:  All adults 28 years of age and older,  Anyone 2 through 80 years of age with certain long-term health problems,  Anyone 2 through 80 years of age with a weakened immune system,  Adults 19 through 80 years of age who smoke cigarettes or have asthma. Most people need only one dose of PPSV. A second dose is recommended for certain high-risk groups. People 30 and older should get a dose even if they have gotten one or more doses of the vaccine before they turned 65. Your healthcare provider can give you more information about these recommendations. Most healthy adults develop protection within 2 to 3 weeks of getting the shot. 3. Some people should not get this vaccine  Anyone who has had a life-threatening allergic reaction to PPSV should not get another dose.  Anyone who has a severe allergy to any component of PPSV should not receive it. Tell your provider if you have any severe allergies.  Anyone who is moderately or severely ill when the shot is scheduled may be asked to wait until they recover before getting the vaccine. Someone with a mild illness can usually be vaccinated.  Children less than 42 years of age should not receive this vaccine.  There is no evidence that PPSV is harmful to either a pregnant woman or to her fetus. However, as a precaution, women who need the vaccine should be vaccinated before becoming pregnant, if possible. 4. Risks of a vaccine reaction With any medicine, including vaccines, there is a chance of  side effects. These are usually mild and go away on their own, but serious reactions are also possible. About half of people who get PPSV have mild side effects, such as redness or pain where the shot is given, which go away within about two days. Less than 1 out of 100 people develop a fever, muscle aches, or more severe local reactions. Problems that could happen  after any vaccine:  People sometimes faint after a medical procedure, including vaccination. Sitting or lying down for about 15 minutes can help prevent fainting, and injuries caused by a fall. Tell your doctor if you feel dizzy, or have vision changes or ringing in the ears.  Some people get severe pain in the shoulder and have difficulty moving the arm where a shot was given. This happens very rarely.  Any medication can cause a severe allergic reaction. Such reactions from a vaccine are very rare, estimated at about 1 in a million doses, and would happen within a few minutes to a few hours after the vaccination. As with any medicine, there is a very remote chance of a vaccine causing a serious injury or death. The safety of vaccines is always being monitored. For more information, visit: http://www.aguilar.org/ 5. What if there is a serious reaction? What should I look for? Look for anything that concerns you, such as signs of a severe allergic reaction, very high fever, or unusual behavior. Signs of a severe allergic reaction can include hives, swelling of the face and throat, difficulty breathing, a fast heartbeat, dizziness, and weakness. These would usually start a few minutes to a few hours after the vaccination. What should I do? If you think it is a severe allergic reaction or other emergency that can't wait, call 9-1-1 or get to the nearest hospital. Otherwise, call your doctor. Afterward, the reaction should be reported to the Vaccine Adverse Event Reporting System (VAERS). Your doctor might file this report, or you can do it yourself through the VAERS web site at www.vaers.SamedayNews.es, or by calling (507)426-8856. VAERS does not give medical advice. 6. How can I learn more?  Ask your doctor. He or she can give you the vaccine package insert or suggest other sources of information.  Call your local or state health department.  Contact the Centers for Disease Control and Prevention  (CDC):  Call 848 224 6420 (1-800-CDC-INFO) or  Visit CDC's website at http://hunter.com/ CDC Pneumococcal Polysaccharide Vaccine VIS (12/09/13) This information is not intended to replace advice given to you by your health care provider. Make sure you discuss any questions you have with your health care provider. Document Released: 06/01/2006 Document Revised: 04/24/2016 Document Reviewed: 04/24/2016 Elsevier Interactive Patient Education  2017 [employer].

## 2016-10-24 ENCOUNTER — Ambulatory Visit (INDEPENDENT_AMBULATORY_CARE_PROVIDER_SITE_OTHER): Payer: Medicare Other | Admitting: General Practice

## 2016-10-24 DIAGNOSIS — D696 Thrombocytopenia, unspecified: Secondary | ICD-10-CM

## 2016-10-24 DIAGNOSIS — Z86718 Personal history of other venous thrombosis and embolism: Secondary | ICD-10-CM

## 2016-10-24 DIAGNOSIS — Z5181 Encounter for therapeutic drug level monitoring: Secondary | ICD-10-CM | POA: Diagnosis not present

## 2016-10-24 LAB — POCT INR: INR: 2.4

## 2016-10-24 NOTE — Patient Instructions (Signed)
Pre visit review using our clinic review tool, if applicable. No additional management support is needed unless otherwise documented below in the visit note. 

## 2016-10-24 NOTE — Progress Notes (Signed)
I have reviewed and agree with the plan. 

## 2016-10-29 ENCOUNTER — Other Ambulatory Visit: Payer: Self-pay | Admitting: Family Medicine

## 2016-11-03 ENCOUNTER — Other Ambulatory Visit: Payer: Self-pay

## 2016-11-03 ENCOUNTER — Other Ambulatory Visit: Payer: Self-pay | Admitting: Family Medicine

## 2016-11-03 MED ORDER — PANTOPRAZOLE SODIUM 40 MG PO TBEC: 40.0000 mg | DELAYED_RELEASE_TABLET | Freq: Every day | ORAL | 1 refills | Status: DC

## 2016-11-03 NOTE — Telephone Encounter (Signed)
[facility]: [facility]  Refill refill request for pantoprazole   Pharmacy: [id number] - [place], [place]

## 2016-11-03 NOTE — Telephone Encounter (Signed)
Medication sent to pharmacy  

## 2016-11-05 DIAGNOSIS — H524 Presbyopia: Secondary | ICD-10-CM | POA: Diagnosis not present

## 2016-11-05 DIAGNOSIS — H2513 Age-related nuclear cataract, bilateral: Secondary | ICD-10-CM | POA: Diagnosis not present

## 2016-11-05 DIAGNOSIS — H25013 Cortical age-related cataract, bilateral: Secondary | ICD-10-CM | POA: Diagnosis not present

## 2016-11-14 ENCOUNTER — Other Ambulatory Visit: Payer: Self-pay | Admitting: Internal Medicine

## 2016-11-16 NOTE — Progress Notes (Addendum)
[place] at [facility], [place], [place] [address] [phone] [id number]  Date:  11/17/2016   Name:  Patrick Hatfield   DOB:  October 26, 1934   MRN:  [id number]  PCP:  Lamar Blinks, MD    Chief Complaint: Annual Exam (Pt here for CPE and is fasting for labs. )   History of Present Illness:  Patrick Hatfield is a 82 y.o. very pleasant male patient who presents with the following:  Here today for a CPE and fasting labs Most recent labs - CMP and CBC in January History of hematuria, gilbert syndrome, HTN, factor V leiden, hyperlipidemia, history of DVT, essential tremor   He spent the Easter weekend with his grandsons- they were able to see one of them play with his college baseball team over the weekend.   He notes that he is overall doing well, except he has noted more issues "with my stomach."  He has had a "history of spastic colon and colitis" in the past per his report.  Over the last 7-8 months he has noted a feeling of bloating and fullness in his belly He has tried OTC meds for bloating and gas He notes early satiety No jaundice noted  Wt Readings from Last 3 Encounters:  11/17/16 217 lb (98.4 kg)  09/26/16 217 lb (98.4 kg)  08/20/16 216 lb 12.8 oz (98.3 kg)   He has noted a change in his stools- they are often loose.  No blood in his stool and no dark tarry stools. No change in the [place] of his stools  His last colon was done in 2016- no repeat recommended   He is a former smoker- negative AAA screening back in 2012 He has his coumadin managed by coumadin clinic - clotting disorder, factor V leiden  He walks for exercise every morning- several blocks with his dogs  No nausea or vomiting He notes that he feels very well for his age excpet for this one thing.   He notes that his father died of pancreatic cancer. There is also family history of heart issues.   He did have a CT abd pelvis in 2013 Lab Results   Component Value Date   INR 2.4 10/24/2016   INR 2.3 09/12/2016   INR 2.2 08/01/2016   PROTIME 22.8 (H) 07/11/2013   PROTIME 30.0 (H) 07/13/2012   PROTIME 16.8 (H) 05/19/2011     Patient Active Problem List   Diagnosis Date Noted  . Insomnia 09/05/2015  . Frequent loose stools 09/05/2015  . Hx of adenomatous colonic polyps 12/14/2014  . Hematuria, gross 02/03/2014  . 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 Thrombocytopenia, unspecified 01/05/2013  . Gilbert syndrome 01/05/2013  . Unspecified adverse effect of unspecified drug, medicinal and biological substance 01/09/2012  . Barrett's esophagus 03/24/2011  . Factor 5 Leiden mutation, heterozygous ([place]) 03/21/2011  . Essential tremor 04/24/2009  . HYPERLIPIDEMIA 10/03/2008  . Essential hypertension 10/03/2008  . GERD 10/03/2008  . Fasting hyperglycemia 10/03/2008  . DVT, HX OF 10/03/2008    Past Medical History:  Diagnosis Date  . Anxiety   . Arthritis    KNEES,ANKLES,HANDS  . Asthma    AS A TEENAGER  . Barrett's esophagus   . Benign essential tremor   . Clotting disorder ([place])    FACTOR 5  . Colitis in 20's  . Colon polyps   . Congenital deficiency of other clotting factors    factor 5  .  Depression 2006   drug overdose  . Diverticulosis   . DVT (deep venous thrombosis) ([place]) 1991 & 2010   X 2 in context Factor 5 def  . Esophageal reflux   . Factor 5 Leiden mutation, heterozygous ([place])   . Gilbert's syndrome   . H/O ischemic bowel disease 2010   while off warfarin   . Herpes zoster 2010   facial  . Hiatal hernia   . Hx of adenomatous colonic polyps 12/14/2014  . Hyperlipidemia   . Hypertension   . Insomnia   . Osteoporosis   . Personal history of colonic polyps 05/28/2007   adenomatous  . Proteinuria age 60  . 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 Umbilical hernia     Past Surgical History:  Procedure Laterality Date  . CHOLECYSTECTOMY  05/16/2011    Dr Marlou Starks  . COLONOSCOPY      Dr Sharlett Iles  . CYSTOSCOPY     Neg  . KNEE ARTHROSCOPY Bilateral 2008, 2012   Dr.Aplington bilateral  . TONSILLECTOMY    . TRANSURETHRAL RESECTION OF PROSTATE      BPH;Dr Dahlstedt    Social History  Substance Use Topics  . Smoking status: Former Smoker    Packs/day: 2.00    Years: 40.00    Types: Cigarettes    Quit date: 08/18/1994  . Smokeless tobacco: Never Used     Comment: smoked 1956-1996, up to 2 ppd  . Alcohol use 3.6 - 7.2 oz/week    6 - 12 Glasses of wine per week     Comment: 1-2 glasses wine daily.     Family History  Problem Relation Age of Onset  . Pancreatic cancer Father 29  . Ulcers Father   . Stroke Father 32  . Diabetes Father   . Coronary artery disease Mother   . Osteoporosis Mother   . HIV Brother   . Multiple sclerosis Sister   . Heart attack Paternal Grandfather     early 8s  . Heart attack Paternal Uncle     2 uncles in early 45s  . Aortic aneurysm Brother   . Colon cancer Neg Hx   . Esophageal cancer Neg Hx   . Stomach cancer Neg Hx     Allergies  Allergen Reactions  . Amlodipine Besy-Benazepril Hcl     REACTION: swelling of feet.  Probably  from Amlodipine component)  . Lipitor [Atorvastatin Calcium]     ? Reaction, ? swelling    Medication list has been reviewed and updated.  Current Outpatient Prescriptions on File Prior to Visit  Medication Sig Dispense Refill  . acetaminophen (TYLENOL) 325 MG tablet Per bottle as needed    . cholecalciferol (VITAMIN D) 1000 UNITS tablet Take 1,000 Units by mouth daily.      . diazepam (VALIUM) 5 MG tablet Take a 1/2 or a whole tablet at bedtime as needed for sleep.  Use as little as possible 30 tablet 0  . Fluticasone Propionate (FLONASE NA) Place into the nose as needed.    . folic acid (FOLVITE) 749 MCG tablet Take 400 mcg by mouth daily.      Marland Kitchen gabapentin (NEURONTIN) 300 MG capsule TAKE 1 OR 2 AT BEDTIME AS NEEDED 180 capsule 0  . losartan-hydrochlorothiazide (HYZAAR) 100-12.5 MG tablet  Take 1 tablet by mouth daily. 90 tablet 3  . Omega-3 Fatty Acids (FISH OIL) 1200 MG CAPS Take 1,200 mg by mouth 2 (two) times daily.      Marland Kitchen  pantoprazole (PROTONIX) 40 MG tablet Take 1 tablet (40 mg total) by mouth daily. 90 tablet 1  . propranolol (INDERAL) 40 MG tablet Take 1 tablet (40 mg total) by mouth 2 (two) times daily. --- Office visit needed for further refills 180 tablet 0  . warfarin (COUMADIN) 5 MG tablet TAKE AS DIRECTED BY ANTICOAGULATION CLINIC 120 tablet 3   No current facility-administered medications on file prior to visit.     Review of Systems:  As per HPI- otherwise negative.   Physical Examination: Vitals:   11/17/16 0938  BP: 122/72  Pulse: 78  Temp: 98.2 F (36.8 C)   Vitals:   11/17/16 0938  Weight: 217 lb (98.4 kg)  Height: 5\' 10"  (1.778 m)   Body mass index is 31.14 kg/m. Ideal Body Weight: Weight in (lb) to have BMI = 25: 173.9  GEN: WDWN, NAD, Non-toxic, A & O x 3, overweight, looks well HEENT: Atraumatic, Normocephalic. Neck supple. No masses, No LAD.  Bilateral TM wnl, oropharynx normal.  PEERL,EOMI.   Ears and Nose: No external deformity. CV: RRR, No M/G/R. No JVD. No thrill. No extra heart sounds. PULM: CTA B, no wheezes, crackles, rhonchi. No retractions. No resp. distress. No accessory muscle use. ABD: S, NT, ND. No rebound. No HSM. EXTR: No c/c/e NEURO Normal gait.  He has a mild to moderate fine tremor  PSYCH: Normally interactive. Conversant. Not depressed or anxious appearing.  Calm demeanor.    Assessment and Plan: Physical exam  Essential hypertension  Essential tremor  Dyslipidemia - Plan: Lipid panel  Chronic anticoagulation - Plan: CBC, Comprehensive metabolic panel  Family history of pancreatic cancer - Plan: Lipase  Medication monitoring encounter - Plan: CBC, Comprehensive metabolic panel  Here today for a CPE Labs are pending- will be in touch with him asap He is concerned about pancreatic cancer- I agree that  we need to try and rule this out.  Will likely get a CT scan for him He does have Rosanna Randy syndrome so elevated bilirubin is baseline for him  Signed Lamar Blinks, MD  Results for orders placed or performed in visit on 11/17/16  CBC  Result Value Ref Range   WBC 5.7 4.0 - 10.5 K/uL   RBC 4.74 4.22 - 5.81 Mil/uL   Platelets 131.0 (L) 150.0 - 400.0 K/uL   Hemoglobin 16.1 13.0 - 17.0 g/dL   HCT 46.9 39.0 - 52.0 %   MCV 98.9 78.0 - 100.0 fl   MCHC 34.5 30.0 - 36.0 g/dL   RDW 13.4 11.5 - 15.5 %  Comprehensive metabolic panel  Result Value Ref Range   Sodium 136 135 - 145 mEq/L   Potassium 3.5 3.5 - 5.1 mEq/L   Chloride 99 96 - 112 mEq/L   CO2 31 19 - 32 mEq/L   Glucose, Bld 115 (H) 70 - 99 mg/dL   BUN 14 6 - 23 mg/dL   Creatinine, Ser 0.85 0.40 - 1.50 mg/dL   Total Bilirubin 2.4 (H) 0.2 - 1.2 mg/dL   Alkaline Phosphatase 49 39 - 117 U/L   AST 40 (H) 0 - 37 U/L   ALT 49 0 - 53 U/L   Total Protein 8.2 6.0 - 8.3 g/dL   Albumin 4.6 3.5 - 5.2 g/dL   Calcium 9.6 8.4 - 10.5 mg/dL   GFR 92.16 >60.00 mL/min  Lipid panel  Result Value Ref Range   Cholesterol 240 (H) 0 - 200 mg/dL   Triglycerides 302.0 (H) 0.0 - 149.0 mg/dL  HDL 39.10 >39.00 mg/dL   VLDL 60.4 (H) 0.0 - 40.0 mg/dL   Total CHOL/HDL Ratio 6    NonHDL 200.53   Lipase  Result Value Ref Range   Lipase 12.0 11.0 - 59.0 U/L  LDL cholesterol, direct  Result Value Ref Range   Direct LDL 134.0 mg/dL   Ct Abdomen Pelvis W Contrast  Result Date: 11/19/2016 CLINICAL DATA:  Abdominal discomfort, bloating and diarrhea. History of colitis and spastic colon. Family history of pancreatic cancer. Previous cholecystectomy and TURP. EXAM: CT ABDOMEN AND PELVIS WITH CONTRAST TECHNIQUE: Multidetector CT imaging of the abdomen and pelvis was performed using the standard protocol following bolus administration of intravenous contrast. CONTRAST:  118mL ISOVUE-300 IOPAMIDOL (ISOVUE-300) INJECTION 61% COMPARISON:  CT 04/20/2012 and  04/09/2009 FINDINGS: Lower chest: Clear lung bases. No significant pleural or pericardial effusion. Coronary artery atherosclerosis noted. Hepatobiliary: Mildly decreased hepatic density suspicious for steatosis. 8 mm low-density lesion in the medial segment of the left hepatic lobe (image number 16) has mildly enlarged from the previous study, and not optimally evaluated due to size. This is probably a cyst. There is also ill-defined low-density in the dome of the right hepatic lobe, best seen on coronal image 53. Cholecystectomy without evidence of biliary dilatation. Pancreas: The pancreas appears stable. There is no evidence of pancreatic mass, surrounding inflammation or ductal dilatation. Spleen: Normal in size without focal abnormality. Adrenals/Urinary Tract: Both adrenal glands appear normal. Probable tiny cyst posteriorly in the mid right kidney (image 19 of series 7). The kidneys otherwise appear normal. No evidence of urinary tract calculus or hydronephrosis. The bladder appears normal. Stomach/Bowel: The stomach, small bowel, appendix and proximal colon appear normal. There are diverticular changes throughout the sigmoid colon without associated wall thickening or surrounding inflammation. Vascular/Lymphatic: There are no enlarged abdominal or pelvic lymph nodes. There is extensive aortic and branch vessel atherosclerosis. No evidence of large vessel occlusion. Reproductive: Mild enlargement and heterogeneity of the prostate gland. 1.8 cm hyperdense left paramedian nodule on image 84 appears unchanged. Other: Stable small umbilical hernia containing only fat. No ascites. Musculoskeletal: No acute or significant osseous findings. There is multilevel lumbar spondylosis with potentially symptomatic spinal and foraminal narrowing at the lower 2 levels. IMPRESSION: 1. No evidence of pancreatic malignancy. 2. No acute abdominopelvic findings. 3. Moderate sigmoid diverticulosis without evidence of acute  inflammation. 4. Small indeterminate liver lesions, too small to characterize. Doubtful significance. 5. Diffuse atherosclerosis. 6. Prostatomegaly with central nodule, likely BPH. 7. Lumbar spondylosis. Electronically Signed   By: Richardean Sale M.D.   On: 11/19/2016 11:12

## 2016-11-17 ENCOUNTER — Encounter: Payer: Self-pay | Admitting: Family Medicine

## 2016-11-17 ENCOUNTER — Ambulatory Visit (INDEPENDENT_AMBULATORY_CARE_PROVIDER_SITE_OTHER): Payer: Medicare Other | Admitting: Family Medicine

## 2016-11-17 VITALS — BP 122/72 | HR 78 | Temp 98.2°F | Ht 70.0 in | Wt 217.0 lb

## 2016-11-17 DIAGNOSIS — I10 Essential (primary) hypertension: Secondary | ICD-10-CM

## 2016-11-17 DIAGNOSIS — Z800 Family history of malignant neoplasm of digestive organs: Secondary | ICD-10-CM | POA: Diagnosis not present

## 2016-11-17 DIAGNOSIS — G250 Essential tremor: Secondary | ICD-10-CM | POA: Diagnosis not present

## 2016-11-17 DIAGNOSIS — R6881 Early satiety: Secondary | ICD-10-CM | POA: Diagnosis not present

## 2016-11-17 DIAGNOSIS — E785 Hyperlipidemia, unspecified: Secondary | ICD-10-CM

## 2016-11-17 DIAGNOSIS — Z0000 Encounter for general adult medical examination without abnormal findings: Secondary | ICD-10-CM

## 2016-11-17 DIAGNOSIS — Z5181 Encounter for therapeutic drug level monitoring: Secondary | ICD-10-CM

## 2016-11-17 DIAGNOSIS — Z7901 Long term (current) use of anticoagulants: Secondary | ICD-10-CM | POA: Diagnosis not present

## 2016-11-17 LAB — LIPID PANEL
Cholesterol: 240 mg/dL — ABNORMAL HIGH (ref 0–200)
HDL: 39.1 mg/dL (ref >39.00)
NonHDL: 200.53
TRIGLYCERIDES: 302 mg/dL — AB (ref 0.0–149.0)
Total CHOL/HDL Ratio: 6
VLDL: 60.4 mg/dL — ABNORMAL HIGH (ref 0.0–40.0)

## 2016-11-17 LAB — CBC
HCT: 46.9 % (ref 39.0–52.0)
HEMOGLOBIN: 16.1 g/dL (ref 13.0–17.0)
MCHC: 34.5 g/dL (ref 30.0–36.0)
MCV: 98.9 fl (ref 78.0–100.0)
PLATELETS: 131 10*3/uL — AB (ref 150.0–400.0)
RBC: 4.74 Mil/uL (ref 4.22–5.81)
RDW: 13.4 % (ref 11.5–15.5)
WBC: 5.7 10*3/uL (ref 4.0–10.5)

## 2016-11-17 LAB — COMPREHENSIVE METABOLIC PANEL
ALT: 49 U/L (ref 0–53)
AST: 40 U/L — AB (ref 0–37)
Albumin: 4.6 g/dL (ref 3.5–5.2)
Alkaline Phosphatase: 49 U/L (ref 39–117)
BILIRUBIN TOTAL: 2.4 mg/dL — AB (ref 0.2–1.2)
BUN: 14 mg/dL (ref 6–23)
CALCIUM: 9.6 mg/dL (ref 8.4–10.5)
CO2: 31 mEq/L (ref 19–32)
CREATININE: 0.85 mg/dL (ref 0.40–1.50)
Chloride: 99 mEq/L (ref 96–112)
GFR: 92.16 mL/min (ref >60.00)
Glucose, Bld: 115 mg/dL — ABNORMAL HIGH (ref 70–99)
Potassium: 3.5 mEq/L (ref 3.5–5.1)
Sodium: 136 mEq/L (ref 135–145)
Total Protein: 8.2 g/dL (ref 6.0–8.3)

## 2016-11-17 LAB — LIPASE: Lipase: 12 U/L (ref 11.0–59.0)

## 2016-11-17 LAB — LDL CHOLESTEROL, DIRECT: Direct LDL: 134 mg/dL

## 2016-11-17 NOTE — Patient Instructions (Signed)
It was a pleasure to see you today as always I will be in touch with your labs asap We are going to check your pancreatic enzyme on your labs today. I would also think about getting a CT scan to check the appearance of your pancreas- I will touch base with you pending your labs and then we will decide on our next step

## 2016-11-18 ENCOUNTER — Telehealth: Payer: Self-pay | Admitting: Family Medicine

## 2016-11-18 NOTE — Telephone Encounter (Signed)
-----   Message from Katha Hamming sent at 11/18/2016  8:56 AM EDT ----- Regarding: ct abd/pelvis Cotton said he could not do his CT today.  He will come by and get oral contrast and schedule his CT.  Thanks, Hoyle Sauer

## 2016-11-19 ENCOUNTER — Encounter (HOSPITAL_BASED_OUTPATIENT_CLINIC_OR_DEPARTMENT_OTHER): Payer: Self-pay

## 2016-11-19 ENCOUNTER — Ambulatory Visit (HOSPITAL_BASED_OUTPATIENT_CLINIC_OR_DEPARTMENT_OTHER)
Admission: RE | Admit: 2016-11-19 | Discharge: 2016-11-19 | Disposition: A | Discharge status: Home / Self Care | Payer: Medicare Other | Source: Ambulatory Visit | Attending: Family Medicine | Admitting: Family Medicine

## 2016-11-19 DIAGNOSIS — N402 Nodular prostate without lower urinary tract symptoms: Secondary | ICD-10-CM | POA: Diagnosis not present

## 2016-11-19 DIAGNOSIS — Z800 Family history of malignant neoplasm of digestive organs: Secondary | ICD-10-CM | POA: Diagnosis not present

## 2016-11-19 DIAGNOSIS — N400 Enlarged prostate without lower urinary tract symptoms: Secondary | ICD-10-CM | POA: Insufficient documentation

## 2016-11-19 DIAGNOSIS — K7689 Other specified diseases of liver: Secondary | ICD-10-CM | POA: Diagnosis not present

## 2016-11-19 DIAGNOSIS — R6881 Early satiety: Secondary | ICD-10-CM

## 2016-11-19 DIAGNOSIS — K5730 Diverticulosis of large intestine without perforation or abscess without bleeding: Secondary | ICD-10-CM | POA: Diagnosis not present

## 2016-11-19 DIAGNOSIS — M47896 Other spondylosis, lumbar region: Secondary | ICD-10-CM | POA: Insufficient documentation

## 2016-11-19 DIAGNOSIS — I700 Atherosclerosis of aorta: Secondary | ICD-10-CM | POA: Diagnosis not present

## 2016-11-19 DIAGNOSIS — R197 Diarrhea, unspecified: Secondary | ICD-10-CM | POA: Diagnosis not present

## 2016-11-19 MED ORDER — IOPAMIDOL (ISOVUE-300) INJECTION 61%
100.0000 mL | Freq: Once | INTRAVENOUS | Status: AC | PRN
  Administered 2016-11-19: 100 mL via INTRAVENOUS

## 2016-11-21 ENCOUNTER — Encounter: Payer: Self-pay | Admitting: Family Medicine

## 2016-11-21 NOTE — Progress Notes (Signed)
Ct Abdomen Pelvis W Contrast  Result Date: 11/19/2016 CLINICAL DATA:  Abdominal discomfort, bloating and diarrhea. History of colitis and spastic colon. Family history of pancreatic cancer. Previous cholecystectomy and TURP. EXAM: CT ABDOMEN AND PELVIS WITH CONTRAST TECHNIQUE: Multidetector CT imaging of the abdomen and pelvis was performed using the standard protocol following bolus administration of intravenous contrast. CONTRAST:  130mL ISOVUE-300 IOPAMIDOL (ISOVUE-300) INJECTION 61% COMPARISON:  CT 04/20/2012 and 04/09/2009 FINDINGS: Lower chest: Clear lung bases. No significant pleural or pericardial effusion. Coronary artery atherosclerosis noted. Hepatobiliary: Mildly decreased hepatic density suspicious for steatosis. 8 mm low-density lesion in the medial segment of the left hepatic lobe (image number 16) has mildly enlarged from the previous study, and not optimally evaluated due to size. This is probably a cyst. There is also ill-defined low-density in the dome of the right hepatic lobe, best seen on coronal image 53. Cholecystectomy without evidence of biliary dilatation. Pancreas: The pancreas appears stable. There is no evidence of pancreatic mass, surrounding inflammation or ductal dilatation. Spleen: Normal in size without focal abnormality. Adrenals/Urinary Tract: Both adrenal glands appear normal. Probable tiny cyst posteriorly in the mid right kidney (image 19 of series 7). The kidneys otherwise appear normal. No evidence of urinary tract calculus or hydronephrosis. The bladder appears normal. Stomach/Bowel: The stomach, small bowel, appendix and proximal colon appear normal. There are diverticular changes throughout the sigmoid colon without associated wall thickening or surrounding inflammation. Vascular/Lymphatic: There are no enlarged abdominal or pelvic lymph nodes. There is extensive aortic and branch vessel atherosclerosis. No evidence of large vessel occlusion. Reproductive: Mild  enlargement and heterogeneity of the prostate gland. 1.8 cm hyperdense left paramedian nodule on image 84 appears unchanged. Other: Stable small umbilical hernia containing only fat. No ascites. Musculoskeletal: No acute or significant osseous findings. There is multilevel lumbar spondylosis with potentially symptomatic spinal and foraminal narrowing at the lower 2 levels. IMPRESSION: 1. No evidence of pancreatic malignancy. 2. No acute abdominopelvic findings. 3. Moderate sigmoid diverticulosis without evidence of acute inflammation. 4. Small indeterminate liver lesions, too small to characterize. Doubtful significance. 5. Diffuse atherosclerosis. 6. Prostatomegaly with central nodule, likely BPH. 7. Lumbar spondylosis. Electronically Signed   By: Richardean Sale M.D.   On: 11/19/2016 11:12

## 2016-11-24 DIAGNOSIS — H25813 Combined forms of age-related cataract, bilateral: Secondary | ICD-10-CM | POA: Diagnosis not present

## 2016-12-04 ENCOUNTER — Other Ambulatory Visit: Payer: Self-pay | Admitting: Family Medicine

## 2016-12-04 DIAGNOSIS — M62838 Other muscle spasm: Secondary | ICD-10-CM

## 2016-12-04 NOTE — Telephone Encounter (Signed)
Requesting: diazepam (VALIUM) 5 MG tablet Contract:  UDS Last OV: 11/17/2016 Last Refill: 08/01/2016  Please Advise

## 2016-12-05 ENCOUNTER — Ambulatory Visit (INDEPENDENT_AMBULATORY_CARE_PROVIDER_SITE_OTHER): Payer: Medicare Other | Admitting: General Practice

## 2016-12-05 DIAGNOSIS — D696 Thrombocytopenia, unspecified: Secondary | ICD-10-CM

## 2016-12-05 DIAGNOSIS — Z5181 Encounter for therapeutic drug level monitoring: Secondary | ICD-10-CM

## 2016-12-05 DIAGNOSIS — Z86718 Personal history of other venous thrombosis and embolism: Secondary | ICD-10-CM

## 2016-12-05 LAB — POCT INR: INR: 2.7

## 2016-12-05 NOTE — Patient Instructions (Signed)
Pre visit review using our clinic review tool, if applicable. No additional management support is needed unless otherwise documented below in the visit note. 

## 2016-12-05 NOTE — Progress Notes (Signed)
I have reviewed and agree with the plan. 

## 2016-12-11 DIAGNOSIS — H25811 Combined forms of age-related cataract, right eye: Secondary | ICD-10-CM | POA: Diagnosis not present

## 2016-12-11 DIAGNOSIS — H2511 Age-related nuclear cataract, right eye: Secondary | ICD-10-CM | POA: Diagnosis not present

## 2016-12-25 DIAGNOSIS — H25812 Combined forms of age-related cataract, left eye: Secondary | ICD-10-CM | POA: Diagnosis not present

## 2016-12-25 DIAGNOSIS — H2512 Age-related nuclear cataract, left eye: Secondary | ICD-10-CM | POA: Diagnosis not present

## 2017-01-09 ENCOUNTER — Ambulatory Visit (INDEPENDENT_AMBULATORY_CARE_PROVIDER_SITE_OTHER): Payer: Medicare Other | Admitting: General Practice

## 2017-01-09 DIAGNOSIS — Z5181 Encounter for therapeutic drug level monitoring: Secondary | ICD-10-CM

## 2017-01-09 DIAGNOSIS — Z86718 Personal history of other venous thrombosis and embolism: Secondary | ICD-10-CM

## 2017-01-09 DIAGNOSIS — D696 Thrombocytopenia, unspecified: Secondary | ICD-10-CM

## 2017-01-09 LAB — POCT INR: INR: 1.8

## 2017-01-09 NOTE — Patient Instructions (Signed)
Pre visit review using our clinic review tool, if applicable. No additional management support is needed unless otherwise documented below in the visit note. 

## 2017-01-24 ENCOUNTER — Other Ambulatory Visit: Payer: Self-pay | Admitting: Family Medicine

## 2017-02-15 ENCOUNTER — Other Ambulatory Visit: Payer: Self-pay | Admitting: Internal Medicine

## 2017-02-20 ENCOUNTER — Ambulatory Visit (INDEPENDENT_AMBULATORY_CARE_PROVIDER_SITE_OTHER): Payer: Medicare Other | Admitting: General Practice

## 2017-02-20 DIAGNOSIS — Z5181 Encounter for therapeutic drug level monitoring: Secondary | ICD-10-CM

## 2017-02-20 DIAGNOSIS — Z86718 Personal history of other venous thrombosis and embolism: Secondary | ICD-10-CM

## 2017-02-20 LAB — POCT INR: INR: 2.6

## 2017-02-20 NOTE — Patient Instructions (Signed)
Pre visit review using our clinic review tool, if applicable. No additional management support is needed unless otherwise documented below in the visit note. 

## 2017-02-20 NOTE — Progress Notes (Signed)
I have reviewed and agree with the plan. 

## 2017-03-25 DIAGNOSIS — M1712 Unilateral primary osteoarthritis, left knee: Secondary | ICD-10-CM | POA: Diagnosis not present

## 2017-04-03 ENCOUNTER — Ambulatory Visit (INDEPENDENT_AMBULATORY_CARE_PROVIDER_SITE_OTHER): Payer: Medicare Other | Admitting: General Practice

## 2017-04-03 DIAGNOSIS — Z5181 Encounter for therapeutic drug level monitoring: Secondary | ICD-10-CM | POA: Diagnosis not present

## 2017-04-03 DIAGNOSIS — Z86718 Personal history of other venous thrombosis and embolism: Secondary | ICD-10-CM | POA: Diagnosis not present

## 2017-04-03 LAB — POCT INR: INR: 2.5

## 2017-04-03 NOTE — Progress Notes (Signed)
I have reviewed and agree with the plan. 

## 2017-04-03 NOTE — Patient Instructions (Signed)
Pre visit review using our clinic review tool, if applicable. No additional management support is needed unless otherwise documented below in the visit note. 

## 2017-04-27 ENCOUNTER — Other Ambulatory Visit: Payer: Self-pay | Admitting: Family Medicine

## 2017-05-15 ENCOUNTER — Ambulatory Visit (INDEPENDENT_AMBULATORY_CARE_PROVIDER_SITE_OTHER): Payer: Medicare Other | Admitting: General Practice

## 2017-05-15 DIAGNOSIS — Z23 Encounter for immunization: Secondary | ICD-10-CM

## 2017-05-15 DIAGNOSIS — Z86718 Personal history of other venous thrombosis and embolism: Secondary | ICD-10-CM

## 2017-05-15 DIAGNOSIS — Z5181 Encounter for therapeutic drug level monitoring: Secondary | ICD-10-CM

## 2017-05-15 DIAGNOSIS — Z7901 Long term (current) use of anticoagulants: Secondary | ICD-10-CM | POA: Insufficient documentation

## 2017-05-15 LAB — POCT INR: INR: 2.9

## 2017-05-15 NOTE — Patient Instructions (Signed)
Pre visit review using our clinic review tool, if applicable. No additional management support is needed unless otherwise documented below in the visit note. 

## 2017-06-23 ENCOUNTER — Encounter: Payer: Self-pay | Admitting: Family Medicine

## 2017-06-23 ENCOUNTER — Other Ambulatory Visit: Payer: Self-pay | Admitting: Family Medicine

## 2017-06-23 DIAGNOSIS — M62838 Other muscle spasm: Secondary | ICD-10-CM

## 2017-06-23 NOTE — Telephone Encounter (Signed)
Last filled in July- ok to fill today

## 2017-06-26 ENCOUNTER — Ambulatory Visit (INDEPENDENT_AMBULATORY_CARE_PROVIDER_SITE_OTHER): Payer: Medicare Other | Admitting: General Practice

## 2017-06-26 DIAGNOSIS — Z7901 Long term (current) use of anticoagulants: Secondary | ICD-10-CM | POA: Diagnosis not present

## 2017-06-26 DIAGNOSIS — Z86718 Personal history of other venous thrombosis and embolism: Secondary | ICD-10-CM

## 2017-06-26 LAB — POCT INR: INR: 2.3

## 2017-06-26 NOTE — Patient Instructions (Addendum)
Pre visit review using our clinic review tool, if applicable. No additional management support is needed unless otherwise documented below in the visit note. 

## 2017-07-20 ENCOUNTER — Encounter: Payer: Self-pay | Admitting: Family Medicine

## 2017-07-20 ENCOUNTER — Other Ambulatory Visit: Payer: Self-pay | Admitting: Family Medicine

## 2017-07-21 MED ORDER — GABAPENTIN 300 MG PO CAPS: ORAL_CAPSULE | ORAL | 1 refills | Status: DC

## 2017-08-04 ENCOUNTER — Ambulatory Visit (INDEPENDENT_AMBULATORY_CARE_PROVIDER_SITE_OTHER): Payer: Medicare Other | Admitting: General Practice

## 2017-08-04 DIAGNOSIS — Z86718 Personal history of other venous thrombosis and embolism: Secondary | ICD-10-CM | POA: Diagnosis not present

## 2017-08-04 DIAGNOSIS — Z7901 Long term (current) use of anticoagulants: Secondary | ICD-10-CM

## 2017-08-04 LAB — POCT INR: INR: 2

## 2017-08-04 NOTE — Patient Instructions (Addendum)
Pre visit review using our clinic review tool, if applicable. No additional management support is needed unless otherwise documented below in the visit note.  Continue to take 1/2 tablet daily except 1 tablet on Tuesday and Saturdays.  Re-check in 6 weeks.

## 2017-08-17 ENCOUNTER — Other Ambulatory Visit: Payer: Self-pay | Admitting: Family Medicine

## 2017-08-17 ENCOUNTER — Encounter: Payer: Self-pay | Admitting: Family Medicine

## 2017-08-28 ENCOUNTER — Other Ambulatory Visit: Payer: Self-pay | Admitting: Family Medicine

## 2017-08-28 DIAGNOSIS — M62838 Other muscle spasm: Secondary | ICD-10-CM

## 2017-08-28 NOTE — Telephone Encounter (Signed)
Requesting: diazepam (VALIUM) 5 MG tablet Contract UDS Last OV: 11/17/16 Last Refill: 06/23/17  Please Advise

## 2017-09-15 ENCOUNTER — Ambulatory Visit (INDEPENDENT_AMBULATORY_CARE_PROVIDER_SITE_OTHER): Payer: Medicare Other | Admitting: General Practice

## 2017-09-15 DIAGNOSIS — Z7901 Long term (current) use of anticoagulants: Secondary | ICD-10-CM

## 2017-09-15 DIAGNOSIS — Z86718 Personal history of other venous thrombosis and embolism: Secondary | ICD-10-CM | POA: Diagnosis not present

## 2017-09-15 LAB — POCT INR: INR: 1.9

## 2017-09-15 NOTE — Patient Instructions (Addendum)
Pre visit review using our clinic review tool, if applicable. No additional management support is needed unless otherwise documented below in the visit note.  Take 1 1/2 tablets today and then resume 1/2 tablet daily except 1 tablet on Tuesday and Saturdays.  Re-check in 6 weeks.

## 2017-09-22 DIAGNOSIS — M170 Bilateral primary osteoarthritis of knee: Secondary | ICD-10-CM | POA: Diagnosis not present

## 2017-10-16 HISTORY — PX: EYE SURGERY: SHX253

## 2017-10-19 ENCOUNTER — Encounter: Payer: Self-pay | Admitting: Family

## 2017-10-19 ENCOUNTER — Ambulatory Visit (INDEPENDENT_AMBULATORY_CARE_PROVIDER_SITE_OTHER): Payer: Medicare Other | Admitting: Family

## 2017-10-19 VITALS — BP 122/72 | HR 71 | Temp 98.3°F | Resp 16 | Ht 70.0 in | Wt 224.2 lb

## 2017-10-19 DIAGNOSIS — J069 Acute upper respiratory infection, unspecified: Secondary | ICD-10-CM

## 2017-10-19 NOTE — Progress Notes (Signed)
Subjective:    Patient ID: Patrick Hatfield, male    DOB: 1935-05-29, 83 y.o.   MRN: [id number]  HPI  Patrick Hatfield is an 83 yr old male who presents today with complaint of nasal congestion and cough. Symptoms started yesterday.  Reports scant amount of yellow phegm.  Denies nausea/vomiting, diarrhea, fever, sore throat.  Had mild headache late yesterday.    Review of Systems    see HPI  Past Medical History:  Diagnosis Date  . Anxiety   . Arthritis    KNEES,ANKLES,HANDS  . Asthma    AS A TEENAGER  . Barrett's esophagus   . Benign essential tremor   . Clotting disorder ([place])    FACTOR 5  . Colitis in 20's  . Colon polyps   . Congenital deficiency of other clotting factors    factor 5  . Depression 2006   drug overdose  . Diverticulosis   . DVT (deep venous thrombosis) ([place]) 1991 & 2010   X 2 in context Factor 5 def  . Esophageal reflux   . Factor 5 Leiden mutation, heterozygous ([place])   . Gilbert's syndrome   . H/O ischemic bowel disease 2010   while off warfarin   . Herpes zoster 2010   facial  . Hiatal hernia   . Hx of adenomatous colonic polyps 12/14/2014  . Hyperlipidemia   . Hypertension   . Insomnia   . Osteoporosis   . Personal history of colonic polyps 05/28/2007   adenomatous  . Proteinuria age 13  . 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 Umbilical hernia      Social History   Socioeconomic History  . Marital status: Married    Spouse name: Not on file  . Number of children: 4  . Years of education: Not on file  . Highest education level: Not on file  Social Needs  . Financial resource strain: Not on file  . Food insecurity - worry: Not on file  . Food insecurity - inability: Not on file  . Transportation needs - medical: Not on file  . Transportation needs - non-medical: Not on file  Occupational History  . Occupation: Retired    Fish farm manager: RETIRED  Tobacco Use  . Smoking status: Former Smoker    Packs/day: 2.00    Years: 40.00    Pack years: 80.00    Types: Cigarettes    Last attempt to quit: 08/18/1994    Years since quitting: 23.1  . Smokeless tobacco: Never Used  . Tobacco comment: smoked 1956-1996, up to 2 ppd  Substance and Sexual Activity  . Alcohol use: Yes    Alcohol/week: 3.6 - 7.2 oz    Types: 6 - 12 Glasses of wine per week    Comment: 1-2 glasses wine daily.   . Drug use: No  . Sexual activity: Not on file  Other Topics Concern  . Not on file  Social History Narrative   Married, lives with spouse   Married x56 years   4 daughters   4 grandchildren   Retired - Armed forces operational officer, sales/technology    Past Surgical History:  Procedure Laterality Date  . CHOLECYSTECTOMY  05/16/2011    Dr Marlou Starks  . COLONOSCOPY     Dr Sharlett Iles  . CYSTOSCOPY     Neg  . KNEE ARTHROSCOPY Bilateral 2008, 2012   Dr.Aplington bilateral  . TONSILLECTOMY    . TRANSURETHRAL RESECTION OF PROSTATE      BPH;Dr Dahlstedt  Family History  Problem Relation Age of Onset  . Pancreatic cancer Father 19  . Ulcers Father   . Stroke Father 52  . Diabetes Father   . Coronary artery disease Mother   . Osteoporosis Mother   . HIV Brother   . Multiple sclerosis Sister   . Heart attack Paternal Grandfather        early 65s  . Heart attack Paternal Uncle        2 uncles in early 40s  . Aortic aneurysm Brother   . Colon cancer Neg Hx   . Esophageal cancer Neg Hx   . Stomach cancer Neg Hx     Allergies  Allergen Reactions  . Amlodipine Besy-Benazepril Hcl     REACTION: swelling of feet.  Probably  from Amlodipine component)  . Lipitor [Atorvastatin Calcium]     ? Reaction, ? swelling    Current Outpatient Medications on File Prior to Visit  Medication Sig Dispense Refill  . acetaminophen (TYLENOL) 325 MG tablet Per bottle as needed    . cholecalciferol (VITAMIN D) 1000 UNITS tablet Take 1,000 Units by mouth daily.      . diazepam (VALIUM) 5 MG tablet TAKE 1/2 TO 1 TABLET BY MOUTH AT BEDTIME AS NEEDED SLEEP 30 tablet  0  . Fluticasone Propionate (FLONASE NA) Place into the nose as needed.    . folic acid (FOLVITE) 242 MCG tablet Take 400 mcg by mouth daily.      Marland Kitchen gabapentin (NEURONTIN) 300 MG capsule TAKE 1 OR 2 CAPSULES AT BEDTIME AS NEEDED 180 capsule 1  . losartan-hydrochlorothiazide (HYZAAR) 100-12.5 MG tablet TAKE 1 TABLET BY MOUTH DAILY. 90 tablet 0  . Omega-3 Fatty Acids (FISH OIL) 1200 MG CAPS Take 1,200 mg by mouth 2 (two) times daily.      . pantoprazole (PROTONIX) 40 MG tablet TAKE 1 TABLET (40 MG TOTAL) BY MOUTH DAILY. 90 tablet 1  . propranolol (INDERAL) 40 MG tablet Take 1 tablet (40 mg total) by mouth 2 (two) times daily. 180 tablet 3  . warfarin (COUMADIN) 5 MG tablet TAKE AS DIRECTED BY ANTICOAGULATION CLINIC 120 tablet 3   No current facility-administered medications on file prior to visit.     BP 122/72   Pulse 71   Temp 98.3 F (36.8 C) (Oral)   Resp 16   Ht 5\' 10"  (1.778 m)   Wt 224 lb 3.2 oz (101.7 kg)   SpO2 96%   BMI 32.17 kg/m    Objective:   Physical Exam  Constitutional: He is oriented to person, place, and time. He appears well-developed and well-nourished. No distress.  HENT:  Head: Normocephalic and atraumatic.  Cardiovascular: Normal rate and regular rhythm.  No murmur heard. Pulmonary/Chest: Effort normal and breath sounds normal. No respiratory distress. He has no wheezes. He has no rales.  Musculoskeletal: He exhibits no edema.  Neurological: He is alert and oriented to person, place, and time.  Skin: Skin is warm and dry.  Psychiatric: He has a normal mood and affect. His behavior is normal. Thought content normal.          Assessment & Plan:  Viral URI- advised pt as follows:  You may use mucinex as needed, flonase, nasal saline, tylenol as needed. For cough you may use delsym.  Call if new/worsening symptoms, if you develop fever or if you are not improved in about 4 days.

## 2017-10-19 NOTE — Patient Instructions (Addendum)
You may use mucinex as needed, flonase, nasal saline, tylenol as needed. For cough you may use delsym.  Call if new/worsening symptoms, if you develop fever or if you are not improved in about 4 days.     Viral Respiratory Infection A viral respiratory infection is an illness that affects parts of the body used for breathing, like the lungs, nose, and throat. It is caused by a germ called a virus. Some examples of this kind of infection are:  A cold.  The flu (influenza).  A respiratory syncytial virus (RSV) infection.  How do I know if I have this infection? Most of the time this infection causes:  A stuffy or runny nose.  Yellow or green fluid in the nose.  A cough.  Sneezing.  Tiredness (fatigue).  Achy muscles.  A sore throat.  Sweating or chills.  A fever.  A headache.  How is this infection treated? If the flu is diagnosed early, it may be treated with an antiviral medicine. This medicine shortens the length of time a person has symptoms. Symptoms may be treated with over-the-counter and prescription medicines, such as:  Expectorants. These make it easier to cough up mucus.  Decongestant nasal sprays.  Doctors do not prescribe antibiotic medicines for viral infections. They do not work with this kind of infection. How do I know if I should stay home? To keep others from getting sick, stay home if you have:  A fever.  A lasting cough.  A sore throat.  A runny nose.  Sneezing.  Muscles aches.  Headaches.  Tiredness.  Weakness.  Chills.  Sweating.  An upset stomach (nausea).  Follow these instructions at home:  Rest as much as possible.  Take over-the-counter and prescription medicines only as told by your doctor.  Drink enough fluid to keep your pee (urine) clear or pale yellow.  Gargle with salt water. Do this 3-4 times per day or as needed. To make a salt-water mixture, dissolve -1 tsp of salt in 1 cup of warm water. Make sure  the salt dissolves all the way.  Use nose drops made from salt water. This helps with stuffiness (congestion). It also helps soften the skin around your nose.  Do not drink alcohol.  Do not use tobacco products, including cigarettes, chewing tobacco, and e-cigarettes. If you need help quitting, ask your doctor. Get help if:  Your symptoms last for 10 days or longer.  Your symptoms get worse over time.  You have a fever.  You have very bad pain in your face or forehead.  Parts of your jaw or neck become very swollen. Get help right away if:  You feel pain or pressure in your chest.  You have shortness of breath.  You faint or feel like you will faint.  You keep throwing up (vomiting).  You feel confused. This information is not intended to replace advice given to you by your health care provider. Make sure you discuss any questions you have with your health care provider. Document Released: 07/17/2008 Document Revised: 01/10/2016 Document Reviewed: 01/10/2015 Elsevier Interactive Patient Education  2018 [employer].

## 2017-10-27 ENCOUNTER — Ambulatory Visit (INDEPENDENT_AMBULATORY_CARE_PROVIDER_SITE_OTHER): Payer: Medicare Other | Admitting: General Practice

## 2017-10-27 DIAGNOSIS — Z7901 Long term (current) use of anticoagulants: Secondary | ICD-10-CM | POA: Diagnosis not present

## 2017-10-27 DIAGNOSIS — Z86718 Personal history of other venous thrombosis and embolism: Secondary | ICD-10-CM

## 2017-10-27 LAB — POCT INR: INR: 2.3

## 2017-10-27 NOTE — Patient Instructions (Addendum)
Pre visit review using our clinic review tool, if applicable. No additional management support is needed unless otherwise documented below in the visit note.  Continue to take 1 tablet on Tuesday and Saturdays.  Re-check in 6 weeks.

## 2017-10-28 ENCOUNTER — Encounter: Payer: Self-pay | Admitting: Family Medicine

## 2017-10-28 DIAGNOSIS — J40 Bronchitis, not specified as acute or chronic: Secondary | ICD-10-CM

## 2017-10-28 MED ORDER — DOXYCYCLINE HYCLATE 100 MG PO CAPS: 100.0000 mg | ORAL_CAPSULE | Freq: Two times a day (BID) | ORAL | 0 refills | Status: DC

## 2017-10-29 DIAGNOSIS — H26492 Other secondary cataract, left eye: Secondary | ICD-10-CM | POA: Diagnosis not present

## 2017-11-02 ENCOUNTER — Ambulatory Visit (INDEPENDENT_AMBULATORY_CARE_PROVIDER_SITE_OTHER): Payer: Medicare Other | Admitting: General Practice

## 2017-11-02 DIAGNOSIS — Z7901 Long term (current) use of anticoagulants: Secondary | ICD-10-CM

## 2017-11-02 DIAGNOSIS — Z86718 Personal history of other venous thrombosis and embolism: Secondary | ICD-10-CM | POA: Diagnosis not present

## 2017-11-02 LAB — POCT INR: INR: 2.2

## 2017-11-02 NOTE — Patient Instructions (Addendum)
Pre visit review using our clinic review tool, if applicable. No additional management support is needed unless otherwise documented below in the visit note.  Continue to take 1 tablet on Tuesday and Saturdays.  Re-check in 6 weeks.

## 2017-11-04 ENCOUNTER — Telehealth: Payer: Self-pay | Admitting: Family Medicine

## 2017-11-04 NOTE — Telephone Encounter (Signed)
Copied from [place] [id number]. Topic: Quick Communication - See Telephone Encounter >> Nov 04, 2017 12:37 PM Rosalin Hawking wrote: CRM for notification. See Telephone encounter for:  11/04/17.   Pt dropped off document to be filled out by provider (1 page Competency Regarding Policy owner document). Pt would like to be called when document ready to pick up tel is [phone]. Document put at front office tray under providers name.

## 2017-11-09 NOTE — Telephone Encounter (Signed)
Received 63 Letter of Personnel officer from [place]; forwarded to provider/SLS 03/25

## 2017-11-10 ENCOUNTER — Encounter: Payer: Self-pay | Admitting: Family Medicine

## 2017-11-10 NOTE — Telephone Encounter (Signed)
Notified patient of form ready for pick up.

## 2017-11-14 ENCOUNTER — Other Ambulatory Visit: Payer: Self-pay | Admitting: Family Medicine

## 2017-11-16 ENCOUNTER — Other Ambulatory Visit: Payer: Self-pay | Admitting: Emergency Medicine

## 2017-11-16 MED ORDER — LOSARTAN POTASSIUM-HCTZ 100-12.5 MG PO TABS: 1.0000 | ORAL_TABLET | Freq: Every day | ORAL | 1 refills | Status: DC

## 2017-11-26 DIAGNOSIS — H26491 Other secondary cataract, right eye: Secondary | ICD-10-CM | POA: Diagnosis not present

## 2017-11-30 NOTE — Progress Notes (Addendum)
Subjective:   Patrick Hatfield is a 83 y.o. male who presents for Medicare Annual/Subsequent preventive examination.  Review of Systems:  Cardiac Risk Factors include: advanced age (>72men, >41 women);hypertension;male gender No ROS.  Medicare Wellness Visit. Additional risk factors are reflected in the social history.  Sleep patterns: pt states he often has restless sleep. Melatonin discussed along with relaxation measures.  Home Safety/Smoke Alarms: Feels safe in home. Smoke alarms in place.  Living environment; residence and Adult nurse: lives with wife in single story townhouse. 2 dogs.   Male:   CCS-No longer doing routine screening due to age.     PSA-  Lab Results  Component Value Date   PSA 1.46 02/08/2014   PSA 1.59 01/05/2013   PSA 1.45 01/22/2011       Objective:    Vitals: BP 130/76 (BP Location: Left Arm, Patient Position: Sitting, Cuff Size: Normal)   Pulse 61   Ht 5\' 10"  (1.778 m)   Wt 221 lb 12.8 oz (100.6 kg)   SpO2 97%   BMI 31.82 kg/m   Body mass index is 31.82 kg/m.  Advanced Directives 12/03/2017 09/26/2016 10/09/2015 09/18/2014  Does Patient Have a Medical Advance Directive? Yes Yes No;Yes Yes  Type of Paramedic of [place];Living will [place];Living will [place];Living will -  Does patient want to make changes to medical advance directive? No - Patient declined - No - Patient declined -  Copy of [place] in Chart? No - copy requested No - copy requested No - copy requested -    Tobacco Social History   Tobacco Use  Smoking Status Former Smoker  . Packs/day: 2.00  . Years: 40.00  . Pack years: 80.00  . Types: Cigarettes  . Last attempt to quit: 08/18/1994  . Years since quitting: 23.3  Smokeless Tobacco Never Used  Tobacco Comment   smoked 1956-1996, up to 2 ppd     Counseling given: Not Answered Comment: smoked 1956-1996, up to 2 ppd   Clinical  Intake: Pain : No/denies pain    Past Medical History:  Diagnosis Date  . Anxiety   . Arthritis    KNEES,ANKLES,HANDS  . Asthma    AS A TEENAGER  . Barrett's esophagus   . Benign essential tremor   . Clotting disorder ([place])    FACTOR 5  . Colitis in 20's  . Colon polyps   . Congenital deficiency of other clotting factors    factor 5  . Depression 2006   drug overdose  . Diverticulosis   . DVT (deep venous thrombosis) ([place]) 1991 & 2010   X 2 in context Factor 5 def  . Esophageal reflux   . Factor 5 Leiden mutation, heterozygous ([place])   . Gilbert's syndrome   . H/O ischemic bowel disease 2010   while off warfarin   . Herpes zoster 2010   facial  . Hiatal hernia   . Hx of adenomatous colonic polyps 12/14/2014  . Hyperlipidemia   . Hypertension   . Insomnia   . Osteoporosis   . Personal history of colonic polyps 05/28/2007   adenomatous  . Proteinuria age 54  . 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 Umbilical hernia    Past Surgical History:  Procedure Laterality Date  . CHOLECYSTECTOMY  05/16/2011    Dr Marlou Starks  . COLONOSCOPY     Dr Sharlett Iles  . CYSTOSCOPY  Neg  . EYE SURGERY Bilateral 10/16/2017   secondary catarct surgery.  Marland Kitchen KNEE ARTHROSCOPY Bilateral 2008, 2012   Dr.Aplington bilateral  . TONSILLECTOMY    . TRANSURETHRAL RESECTION OF PROSTATE      BPH;Dr Dahlstedt   Family History  Problem Relation Age of Onset  . Pancreatic cancer Father 52  . Ulcers Father   . Stroke Father 37  . Diabetes Father   . Coronary artery disease Mother   . Osteoporosis Mother   . HIV Brother   . Multiple sclerosis Sister   . Heart attack Paternal Grandfather        early 57s  . Heart attack Paternal Uncle        2 uncles in early 73s  . Aortic aneurysm Brother   . Colon cancer Neg Hx   . Esophageal cancer Neg Hx   . Stomach cancer Neg Hx    Social History   Socioeconomic History  . Marital status: Married    Spouse name: Not on file  .  Number of children: 4  . Years of education: Not on file  . Highest education level: Not on file  Occupational History  . Occupation: Retired    Fish farm manager: RETIRED  Social Needs  . Financial resource strain: Not on file  . Food insecurity:    Worry: Not on file    Inability: Not on file  . Transportation needs:    Medical: Not on file    Non-medical: Not on file  Tobacco Use  . Smoking status: Former Smoker    Packs/day: 2.00    Years: 40.00    Pack years: 80.00    Types: Cigarettes    Last attempt to quit: 08/18/1994    Years since quitting: 23.3  . Smokeless tobacco: Never Used  . Tobacco comment: smoked 1956-1996, up to 2 ppd  Substance and Sexual Activity  . Alcohol use: Yes    Alcohol/week: 3.6 - 7.2 oz    Types: 6 - 12 Glasses of wine per week    Comment: 1-2 glasses wine daily.   . Drug use: No  . Sexual activity: Not Currently  Lifestyle  . Physical activity:    Days per week: Not on file    Minutes per session: Not on file  . Stress: Not on file  Relationships  . Social connections:    Talks on phone: Not on file    Gets together: Not on file    Attends religious service: Not on file    Active member of club or organization: Not on file    Attends meetings of clubs or organizations: Not on file    Relationship status: Not on file  Other Topics Concern  . Not on file  Social History Narrative   Married, lives with spouse   Married x56 years   4 daughters   4 grandchildren   Retired - Armed forces operational officer, sales/technology    Outpatient Encounter Medications as of 12/03/2017  Medication Sig  . acetaminophen (TYLENOL) 325 MG tablet Per bottle as needed  . cholecalciferol (VITAMIN D) 1000 UNITS tablet Take 1,000 Units by mouth daily.    . diazepam (VALIUM) 5 MG tablet TAKE 1/2 TO 1 TABLET BY MOUTH AT BEDTIME AS NEEDED SLEEP  . Fluticasone Propionate (FLONASE NA) Place into the nose as needed.  . folic acid (FOLVITE) 329 MCG tablet Take 400 mcg by mouth daily.      Marland Kitchen gabapentin (NEURONTIN) 300 MG capsule TAKE 1 OR  2 CAPSULES AT BEDTIME AS NEEDED  . losartan-hydrochlorothiazide (HYZAAR) 100-12.5 MG tablet Take 1 tablet by mouth daily.  . Omega-3 Fatty Acids (FISH OIL) 1200 MG CAPS Take 1,200 mg by mouth 2 (two) times daily.    . pantoprazole (PROTONIX) 40 MG tablet TAKE 1 TABLET (40 MG TOTAL) BY MOUTH DAILY.  Marland Kitchen propranolol (INDERAL) 40 MG tablet Take 1 tablet (40 mg total) by mouth 2 (two) times daily.  Marland Kitchen warfarin (COUMADIN) 5 MG tablet TAKE AS DIRECTED BY ANTICOAGULATION CLINIC  . [DISCONTINUED] doxycycline (VIBRAMYCIN) 100 MG capsule Take 1 capsule (100 mg total) by mouth 2 (two) times daily.   No facility-administered encounter medications on file as of 12/03/2017.     Activities of Daily Living In your present state of health, do you have any difficulty performing the following activities: 12/03/2017  Hearing? Y  Comment with background noise  Vision? N  Comment wearing glasses.  Difficulty concentrating or making decisions? N  Walking or climbing stairs? N  Dressing or bathing? N  Doing errands, shopping? N  Preparing Food and eating ? N  Using the Toilet? N  In the past six months, have you accidently leaked urine? N  Do you have problems with loss of bowel control? N  Managing your Medications? N  Managing your Finances? N  Housekeeping or managing your Housekeeping? N  Some recent data might be hidden    Patient Care Team: Copland, Gay Filler, MD as PCP - General (Family Medicine) Latanya Maudlin, MD as Consulting Physician (Orthopedic Surgery) Gatha Mayer, MD as Consulting Physician (Gastroenterology) Marshell Garfinkel, MD as Consulting Physician (Pulmonary Disease)   Assessment:   This is a routine wellness examination for [place]. Physical assessment deferred to PCP.  Exercise Activities and Dietary recommendations Current Exercise Habits: Home exercise routine, Type of exercise: walking, Time (Minutes): 20, Frequency  (Times/Week): 7, Weekly Exercise (Minutes/Week): 140, Intensity: Mild, Exercise limited by: None identified   Diet (meal preparation, eat out, water intake, caffeinated beverages, dairy products, fruits and vegetables): in general, a "healthy" diet  , well balanced    Goals    . Maintain healthy active lifestyle.        Fall Risk Fall Risk  12/03/2017 09/26/2016 01/17/2016 10/09/2015 09/18/2014  Falls in the past year? No No No No No   Depression Screen PHQ 2/9 Scores 12/03/2017 09/26/2016 01/17/2016 10/09/2015  PHQ - 2 Score 0 0 0 0    Cognitive Function Ad8 score reviewed for issues:  Issues making decisions:no  Less interest in hobbies / activities:no  Repeats questions, stories (family complaining):no  Trouble using ordinary gadgets (microwave, computer, phone):no  Forgets the month or year: no  Mismanaging finances: no  Remembering appts:no  Daily problems with thinking and/or memory:no Ad8 score is=0  MMSE - Mini Mental State Exam 09/26/2016  Orientation to time 4  Orientation to Place 5  Registration 3  Attention/ Calculation 5  Recall 3  Language- name 2 objects 2  Language- repeat 1  Language- follow 3 step command 3  Language- read & follow direction 1  Write a sentence 1  Copy design 1  Total score 29        Immunization History  Administered Date(s) Administered  . DT 02/16/2015  . Influenza Split 05/03/2012  . Influenza Whole 07/24/2010  . Influenza, High Dose Seasonal PF 05/20/2013, 05/09/2016, 05/15/2017  . Influenza,inj,Quad PF,6+ Mos 05/03/2014, 05/16/2015  . Pneumococcal Conjugate-13 09/05/2015  . Pneumococcal Polysaccharide-23 09/26/2016  . Td 03/03/2015  Screening Tests Health Maintenance  Topic Date Due  . INFLUENZA VACCINE  03/18/2018  . TETANUS/TDAP  03/02/2025  . PNA vac Low Risk Adult  Completed        Plan:   Follow up with Dr.Copland as scheduled.  Continue to eat heart healthy diet (full of fruits, vegetables, whole grains,  lean protein, water--limit salt, fat, and sugar intake) and increase physical activity as tolerated.  Continue doing brain stimulating activities (puzzles, reading, adult coloring books, staying active) to keep memory sharp.   Bring a copy of your living will and/or healthcare power of attorney to your next office visit.  I have put in a referral to Audiology for you. They will call you to set up appointment.   I have personally reviewed and noted the following in the patient's chart:   . Medical and social history . Use of alcohol, tobacco or illicit drugs  . Current medications and supplements . Functional ability and status . Nutritional status . Physical activity . Advanced directives . List of other physicians . Hospitalizations, surgeries, and ER visits in previous 12 months . Vitals . Screenings to include cognitive, depression, and falls . Referrals and appointments  In addition, I have reviewed and discussed with patient certain preventive protocols, quality metrics, and best practice recommendations. A written personalized care plan for preventive services as well as general preventive health recommendations were provided to patient.     Shela Nevin, RN  12/03/2017  I have reviewed the above MWE by Ms. Vevelyn Royals and agree with her documentation Denny Peon MD

## 2017-12-03 ENCOUNTER — Ambulatory Visit (INDEPENDENT_AMBULATORY_CARE_PROVIDER_SITE_OTHER): Payer: Medicare Other | Admitting: *Deleted

## 2017-12-03 ENCOUNTER — Encounter: Payer: Self-pay | Admitting: *Deleted

## 2017-12-03 VITALS — BP 130/76 | HR 61 | Ht 70.0 in | Wt 221.8 lb

## 2017-12-03 DIAGNOSIS — H9193 Unspecified hearing loss, bilateral: Secondary | ICD-10-CM

## 2017-12-03 DIAGNOSIS — Z0000 Encounter for general adult medical examination without abnormal findings: Secondary | ICD-10-CM

## 2017-12-03 NOTE — Patient Instructions (Signed)
Continue to eat heart healthy diet (full of fruits, vegetables, whole grains, lean protein, water--limit salt, fat, and sugar intake) and increase physical activity as tolerated.  Continue doing brain stimulating activities (puzzles, reading, adult coloring books, staying active) to keep memory sharp.   Bring a copy of your living will and/or healthcare power of attorney to your next office visit.  I have put in a referral to Audiology for you. They will call you to set up appointment.   Patrick Hatfield , Thank you for taking time to come for your Medicare Wellness Visit. I appreciate your ongoing commitment to your health goals. Please review the following plan we discussed and let me know if I can assist you in the future.   These are the goals we discussed: Goals    . Maintain healthy active lifestyle.        This is a list of the screening recommended for you and due dates:  Health Maintenance  Topic Date Due  . Flu Shot  03/18/2018  . Tetanus Vaccine  03/02/2025  . Pneumonia vaccines  Completed    Health Maintenance, Male A healthy lifestyle and preventive care is important for your health and wellness. Ask your health care provider about what schedule of regular examinations is right for you. What should I know about weight and diet? Eat a Healthy Diet  Eat plenty of vegetables, fruits, whole grains, low-fat dairy products, and lean protein.  Do not eat a lot of foods high in solid fats, added sugars, or salt.  Maintain a Healthy Weight Regular exercise can help you achieve or maintain a healthy weight. You should:  Do at least 150 minutes of exercise each week. The exercise should increase your heart rate and make you sweat (moderate-intensity exercise).  Do strength-training exercises at least twice a week.  Watch Your Levels of Cholesterol and Blood Lipids  Have your blood tested for lipids and cholesterol every 5 years starting at 81 years of age. If you are at high  risk for heart disease, you should start having your blood tested when you are 81 years old. You may need to have your cholesterol levels checked more often if: ? Your lipid or cholesterol levels are high. ? You are older than 81 years of age. ? You are at high risk for heart disease.  What should I know about cancer screening? Many types of cancers can be detected early and may often be prevented. Lung Cancer  You should be screened every year for lung cancer if: ? You are a current smoker who has smoked for at least 30 years. ? You are a former smoker who has quit within the past 15 years.  Talk to your health care provider about your screening options, when you should start screening, and how often you should be screened.  Colorectal Cancer  Routine colorectal cancer screening usually begins at 81 years of age and should be repeated every 5-10 years until you are 82 years old. You may need to be screened more often if early forms of precancerous polyps or small growths are found. Your health care provider may recommend screening at an earlier age if you have risk factors for colon cancer.  Your health care provider may recommend using home test kits to check for hidden blood in the stool.  A small camera at the end of a tube can be used to examine your colon (sigmoidoscopy or colonoscopy). This checks for the earliest forms of colorectal  cancer.  Prostate and Testicular Cancer  Depending on your age and overall health, your health care provider may do certain tests to screen for prostate and testicular cancer.  Talk to your health care provider about any symptoms or concerns you have about testicular or prostate cancer.  Skin Cancer  Check your skin from head to toe regularly.  Tell your health care provider about any new moles or changes in moles, especially if: ? There is a change in a mole's size, shape, or color. ? You have a mole that is larger than a pencil  eraser.  Always use sunscreen. Apply sunscreen liberally and repeat throughout the day.  Protect yourself by wearing long sleeves, pants, a wide-brimmed hat, and sunglasses when outside.  What should I know about heart disease, diabetes, and high blood pressure?  If you are 45-51 years of age, have your blood pressure checked every 3-5 years. If you are 33 years of age or older, have your blood pressure checked every year. You should have your blood pressure measured twice-once when you are at a hospital or clinic, and once when you are not at a hospital or clinic. Record the average of the two measurements. To check your blood pressure when you are not at a hospital or clinic, you can use: ? An automated blood pressure machine at a pharmacy. ? A home blood pressure monitor.  Talk to your health care provider about your target blood pressure.  If you are between 15-100 years old, ask your health care provider if you should take aspirin to prevent heart disease.  Have regular diabetes screenings by checking your fasting blood sugar level. ? If you are at a normal weight and have a low risk for diabetes, have this test once every three years after the age of 48. ? If you are overweight and have a high risk for diabetes, consider being tested at a younger age or more often.  A one-time screening for abdominal aortic aneurysm (AAA) by ultrasound is recommended for men aged 73-75 years who are current or former smokers. What should I know about preventing infection? Hepatitis B If you have a higher risk for hepatitis B, you should be screened for this virus. Talk with your health care provider to find out if you are at risk for hepatitis B infection. Hepatitis C Blood testing is recommended for:  Everyone born from [birth year] through 1963.  Anyone with known risk factors for hepatitis C.  Sexually Transmitted Diseases (STDs)  You should be screened each year for STDs including gonorrhea and  chlamydia if: ? You are sexually active and are younger than 81 years of age. ? You are older than 81 years of age and your health care provider tells you that you are at risk for this type of infection. ? Your sexual activity has changed since you were last screened and you are at an increased risk for chlamydia or gonorrhea. Ask your health care provider if you are at risk.  Talk with your health care provider about whether you are at high risk of being infected with HIV. Your health care provider may recommend a prescription medicine to help prevent HIV infection.  What else can I do?  Schedule regular health, dental, and eye exams.  Stay current with your vaccines (immunizations).  Do not use any tobacco products, such as cigarettes, chewing tobacco, and e-cigarettes. If you need help quitting, ask your health care provider.  Limit alcohol intake to no more  than 2 drinks per day. One drink equals 12 ounces of beer, 5 ounces of wine, or 1 ounces of hard liquor.  Do not use street drugs.  Do not share needles.  Ask your health care provider for help if you need support or information about quitting drugs.  Tell your health care provider if you often feel depressed.  Tell your health care provider if you have ever been abused or do not feel safe at home. This information is not intended to replace advice given to you by your health care provider. Make sure you discuss any questions you have with your health care provider. Document Released: 01/31/2008 Document Revised: 04/02/2016 Document Reviewed: 05/08/2015 Elsevier Interactive Patient Education  [employer].

## 2017-12-08 ENCOUNTER — Ambulatory Visit: Payer: Medicare Other

## 2017-12-15 ENCOUNTER — Ambulatory Visit (INDEPENDENT_AMBULATORY_CARE_PROVIDER_SITE_OTHER): Payer: Medicare Other | Admitting: General Practice

## 2017-12-15 DIAGNOSIS — Z86718 Personal history of other venous thrombosis and embolism: Secondary | ICD-10-CM | POA: Diagnosis not present

## 2017-12-15 DIAGNOSIS — Z7901 Long term (current) use of anticoagulants: Secondary | ICD-10-CM

## 2017-12-15 LAB — POCT INR: INR: 2

## 2017-12-15 NOTE — Patient Instructions (Addendum)
Pre visit review using our clinic review tool, if applicable. No additional management support is needed unless otherwise documented below in the visit note.  Continue to take 1 tablet on Tuesday and Saturdays.  Re-check in 6 weeks.

## 2017-12-29 DIAGNOSIS — H903 Sensorineural hearing loss, bilateral: Secondary | ICD-10-CM | POA: Diagnosis not present

## 2018-01-07 ENCOUNTER — Other Ambulatory Visit: Payer: Self-pay | Admitting: Family Medicine

## 2018-01-07 ENCOUNTER — Encounter: Payer: Self-pay | Admitting: Family Medicine

## 2018-01-07 DIAGNOSIS — M62838 Other muscle spasm: Secondary | ICD-10-CM

## 2018-01-07 NOTE — Telephone Encounter (Signed)
Received refill request for diazepam (VALIUM) 5 MG tablet.  Last office visit 10/19/17 and last refill 08/28/17. Please advise.

## 2018-01-21 ENCOUNTER — Encounter: Payer: Self-pay | Admitting: Family Medicine

## 2018-01-26 ENCOUNTER — Encounter: Payer: Self-pay | Admitting: Family Medicine

## 2018-01-26 ENCOUNTER — Ambulatory Visit (INDEPENDENT_AMBULATORY_CARE_PROVIDER_SITE_OTHER): Payer: Medicare Other | Admitting: General Practice

## 2018-01-26 DIAGNOSIS — Z7901 Long term (current) use of anticoagulants: Secondary | ICD-10-CM | POA: Diagnosis not present

## 2018-01-26 DIAGNOSIS — H9190 Unspecified hearing loss, unspecified ear: Secondary | ICD-10-CM | POA: Insufficient documentation

## 2018-01-26 DIAGNOSIS — Z86718 Personal history of other venous thrombosis and embolism: Secondary | ICD-10-CM

## 2018-01-26 LAB — POCT INR: INR: 2.2 (ref 2.0–3.0)

## 2018-01-26 NOTE — Patient Instructions (Addendum)
Pre visit review using our clinic review tool, if applicable. No additional management support is needed unless otherwise documented below in the visit note.  Continue to take 1 tablet on Tuesday and Saturdays.  Re-check in 6 weeks.

## 2018-02-07 ENCOUNTER — Other Ambulatory Visit: Payer: Self-pay | Admitting: Family Medicine

## 2018-02-19 NOTE — Progress Notes (Addendum)
[facility] [place], [place], [place] [address] [phone]  Date:  02/22/2018   Name:  Patrick Hatfield   DOB:  09-21-1934   MRN:  [id number]  PCP:  Darreld Mclean, MD    Chief Complaint: Annual Exam (no concerns, feeling very good )   History of Present Illness:  Patrick Hatfield is a 83 y.o. 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   He notes that he is overall doing well, except he has noted more issues "with my stomach."  He has had a "history of spastic colon and colitis" in the past per his report.  Over the last 7-8 months he has noted a feeling of bloating and fullness in his belly He has tried OTC meds for bloating and gas He notes early satiety No jaundice noted     Wt Readings from Last 3 Encounters:  11/17/16 217 lb (98.4 kg)  09/26/16 217 lb (98.4 kg)  08/20/16 216 lb 12.8 oz (98.3 kg)   He has noted a change in his stools- they are often loose.  No blood in his stool and no dark tarry stools. No change in the [place] of his stools His last colon was done in 2016- no repeat recommended  He is a former smoker- negative AAA screening back in 2012 He has his coumadin managed by coumadin clinic - clotting disorder, factor V leiden He walks for exercise every morning- several blocks with his dogs  No nausea or vomiting He notes that he feels very well for his age excpet for this one thing.   He notes that his father died of pancreatic  cancer. There is also family history of heart issues.    His grand-son is a Therapist, art in college, baseball team.  unfortunately he did get hurt so he is out for the rest of the season.  They are not sure if he will be able to play next year Last year [name] was worried about pancreatic cancer so we did a CT - this was ok, and his worrisome sx have really resolved He has occasional mild nose bleeds but no other significant bleeding His coumadin has been stable and easy to manage generally  Asked about PSA- he would like to have this done today  He is walking with his dog still daily- they lost one dog but they still have one.   He is fasting right now- he did eat breakfast early  He had shingles in the last 7-8 years.  Discussed shingrix - he would like to have this done He takes protonix every 3-4 days  He uses propranolol for his tremor- he does feel like it helps him  His wife [name] recently completed treatment for leukemia and responded well, she is in remission which is great news  They have been married for 46 years, 4 daughters and 6 grands  BP Readings from Last  3 Encounters:  02/22/18 130/80  12/03/17 130/76  10/19/17 122/72   Wt Readings from Last 3 Encounters:  02/22/18 222 lb (100.7 kg)  12/03/17 221 lb 12.8 oz (100.6 kg)  10/19/17 224 lb 3.2 oz (101.7 kg)    Patient Active Problem List   Diagnosis Date Noted  . Hearing loss 01/26/2018  . Long term (current) use of anticoagulants 05/15/2017  . Encounter for therapeutic drug monitoring 12/05/2016  . Insomnia 09/05/2015  . Frequent loose stools 09/05/2015  . Hx of adenomatous colonic polyps 12/14/2014  . Hematuria, gross 02/03/2014  . 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 Thrombocytopenia, unspecified ([place]) 01/05/2013  . Gilbert syndrome 01/05/2013  . Unspecified adverse effect of unspecified drug, medicinal and biological substance 01/09/2012  . Barrett's esophagus 03/24/2011  . Factor 5 Leiden mutation,  heterozygous ([place]) 03/21/2011  . Essential tremor 04/24/2009  . HYPERLIPIDEMIA 10/03/2008  . Essential hypertension 10/03/2008  . GERD 10/03/2008  . Fasting hyperglycemia 10/03/2008  . DVT, HX OF 10/03/2008    Past Medical History:  Diagnosis Date  . Anxiety   . Arthritis    KNEES,ANKLES,HANDS  . Asthma    AS A TEENAGER  . Barrett's esophagus   . Benign essential tremor   . Clotting disorder ([place])    FACTOR 5  . Colitis in 20's  . Colon polyps   . Congenital deficiency of other clotting factors    factor 5  . Depression 2006   drug overdose  . Diverticulosis   . DVT (deep venous thrombosis) ([place]) 1991 & 2010   X 2 in context Factor 5 def  . Esophageal reflux   . Factor 5 Leiden mutation, heterozygous ([place])   . Gilbert's syndrome   . H/O ischemic bowel disease 2010   while off warfarin   . Herpes zoster 2010   facial  . Hiatal hernia   . Hx of adenomatous colonic polyps 12/14/2014  . Hyperlipidemia   . Hypertension   . Insomnia   . Osteoporosis   . Personal history of colonic polyps 05/28/2007   adenomatous  . Proteinuria age 47  . 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 Umbilical hernia     Past Surgical History:  Procedure Laterality Date  . CHOLECYSTECTOMY  05/16/2011    Dr Marlou Starks  . COLONOSCOPY     Dr Sharlett Iles  . CYSTOSCOPY     Neg  . EYE SURGERY Bilateral 10/16/2017   secondary catarct surgery.  Marland Kitchen KNEE ARTHROSCOPY Bilateral 2008, 2012   Dr.Aplington bilateral  . TONSILLECTOMY    . TRANSURETHRAL RESECTION OF PROSTATE      BPH;Dr Dahlstedt    Social History   Tobacco Use  . Smoking status: Former Smoker    Packs/day: 2.00    Years: 40.00    Pack years: 80.00    Types: Cigarettes    Last attempt to quit: 08/18/1994    Years since quitting: 23.5  . Smokeless tobacco: Never Used  . Tobacco comment: smoked 1956-1996, up to 2 ppd  Substance Use Topics  . Alcohol use: Yes    Alcohol/week: 3.6 - 7.2 oz    Types: 6 - 12 Glasses of  wine per week    Comment: 1-2 glasses wine daily.   . Drug use: No    Family History  Problem Relation Age of Onset  . Pancreatic cancer Father 16  . Ulcers Father   . Stroke Father 60  . Diabetes Father   . Coronary  artery disease Mother   . Osteoporosis Mother   . HIV Brother   . Multiple sclerosis Sister   . Heart attack Paternal Grandfather        early 16s  . Heart attack Paternal Uncle        2 uncles in early 69s  . Aortic aneurysm Brother   . Colon cancer Neg Hx   . Esophageal cancer Neg Hx   . Stomach cancer Neg Hx     Allergies  Allergen Reactions  . Amlodipine Besy-Benazepril Hcl     REACTION: swelling of feet.  Probably  from Amlodipine component)  . Lipitor [Atorvastatin Calcium]     ? Reaction, ? swelling    Medication list has been reviewed and updated.  Current Outpatient Medications on File Prior to Visit  Medication Sig Dispense Refill  . acetaminophen (TYLENOL) 325 MG tablet Per bottle as needed    . cholecalciferol (VITAMIN D) 1000 UNITS tablet Take 1,000 Units by mouth daily.      . diazepam (VALIUM) 5 MG tablet TAKE 1/2 TO 1 TABLET BY MOUTH AT BEDTIME AS NEEDED SLEEP 30 tablet 0  . Fluticasone Propionate (FLONASE NA) Place into the nose as needed.    . folic acid (FOLVITE) 671 MCG tablet Take 400 mcg by mouth daily.      Marland Kitchen gabapentin (NEURONTIN) 300 MG capsule TAKE 1 OR 2 CAPSULES AT BEDTIME AS NEEDED 180 capsule 1  . losartan-hydrochlorothiazide (HYZAAR) 100-12.5 MG tablet Take 1 tablet by mouth daily. 90 tablet 1  . Omega-3 Fatty Acids (FISH OIL) 1200 MG CAPS Take 1,200 mg by mouth 2 (two) times daily.      . pantoprazole (PROTONIX) 40 MG tablet TAKE 1 TABLET (40 MG TOTAL) BY MOUTH DAILY. 90 tablet 1  . warfarin (COUMADIN) 5 MG tablet TAKE AS DIRECTED BY ANTICOAGULATION CLINIC 120 tablet 3   No current facility-administered medications on file prior to visit.     Review of Systems:  As per HPI- otherwise negative. No CP or SOB Overall he  is feeling great  He does have some hearing loss but has not yet gotten hearing aids  He notes that his tremor is improved with his BB- without it "I can't eat soup!"    Physical Examination: Vitals:   02/22/18 1354  BP: 130/80  Pulse: 83  Resp: 16  SpO2: 95%   Vitals:   02/22/18 1354  Weight: 222 lb (100.7 kg)  Height: 5\' 10"  (1.778 m)   Body mass index is 31.85 kg/m. Ideal Body Weight: Weight in (lb) to have BMI = 25: 173.9  GEN: WDWN, NAD, Non-toxic, A & O x 3, mild obesity, looks well, mild HOH HEENT: Atraumatic, Normocephalic. Neck supple. No masses, No LAD.  Bilateral TM wnl, oropharynx normal.  PEERL,EOMI.   Ears and Nose: No external deformity. CV: RRR, No M/G/R. No JVD. No thrill. No extra heart sounds. PULM: CTA B, no wheezes, crackles, rhonchi. No retractions. No resp. distress. No accessory muscle use. ABD: S, NT, ND. No rebound. No HSM. EXTR: No c/c/e NEURO Normal gait.  PSYCH: Normally interactive. Conversant. Not depressed or anxious appearing.  Calm demeanor.  Essential tremor is apparent    Assessment and Plan: Physical exam  Long term (current) use of anticoagulants  Essential hypertension  Essential tremor - Plan: CBC, propranolol (INDERAL) 40 MG tablet  Dyslipidemia - Plan: Lipid panel  Gilbert syndrome - Plan: Comprehensive metabolic panel  Screening for prostate cancer - Plan: PSA  Immunization due - Plan: Varicella-zoster vaccine IM (Shingrix)  CPE today Labs pending as above Given shingrix #1 Zoe Lan continues to do very well and is leading a healthy, active life in his 86s.  Await labs and will be in touch with results asap  Did refills for him   Signed Lamar Blinks, MD  Received his labs 7/9, message to pt  Results for orders placed or performed in visit on 02/22/18  CBC  Result Value Ref Range   WBC 6.6 4.0 - 10.5 K/uL   RBC 4.79 4.22 - 5.81 Mil/uL   Platelets 118.0 (L) 150.0 - 400.0 K/uL   Hemoglobin 16.3 13.0 -  17.0 g/dL   HCT 46.6 39.0 - 52.0 %   MCV 97.2 78.0 - 100.0 fl   MCHC 34.9 30.0 - 36.0 g/dL   RDW 13.3 11.5 - 15.5 %  Comprehensive metabolic panel  Result Value Ref Range   Sodium 135 135 - 145 mEq/L   Potassium 3.8 3.5 - 5.1 mEq/L   Chloride 98 96 - 112 mEq/L   CO2 28 19 - 32 mEq/L   Glucose, Bld 101 (H) 70 - 99 mg/dL   BUN 14 6 - 23 mg/dL   Creatinine, Ser 0.83 0.40 - 1.50 mg/dL   Total Bilirubin 2.3 (H) 0.2 - 1.2 mg/dL   Alkaline Phosphatase 48 39 - 117 U/L   AST 48 (H) 0 - 37 U/L   ALT 65 (H) 0 - 53 U/L   Total Protein 7.8 6.0 - 8.3 g/dL   Albumin 4.4 3.5 - 5.2 g/dL   Calcium 9.7 8.4 - 10.5 mg/dL   GFR 94.42 >60.00 mL/min  Lipid panel  Result Value Ref Range   Cholesterol 248 (H) 0 - 200 mg/dL   Triglycerides 243.0 (H) 0.0 - 149.0 mg/dL   HDL 40.80 >39.00 mg/dL   VLDL 48.6 (H) 0.0 - 40.0 mg/dL   Total CHOL/HDL Ratio 6    NonHDL 207.03   PSA  Result Value Ref Range   PSA 2.04 0.10 - 4.00 ng/mL  LDL cholesterol, direct  Result Value Ref Range   Direct LDL 161.0 mg/dL   Blood counts: ok except platelets are mildly low.  Certainly not low enough to be a bleeding concern. Your platelets have been low in the past but this is a bit worse than your baseline.  Let's repeat in 3 months Metabolic profile looks ok.  You have Rosanna Randy syndrome so we expect the high bilirubin. AST/ ALT are a bit high this time as well, may indicate some mild liver distress.  Looking back your AST/ ALT have bit slightly elevated and fluctuating over the last couple of years. Let's do a liver ultrasound for you- I will set this up.    Your cholesterol is not great, but it is stable. Do you have any interest in a cholesterol medication?  I know that you are "older" but you are in great health, so I do think a statin might offer you some benefit if you would like to take one.  Finally, your PSA: Lab Results      Component                Value               Date                      PSA  2.04                02/22/2018                PSA                      1.46                02/08/2014                PSA                      1.59                01/05/2013           Your PSA has not been checked in a few years as it turns out, and it did go up a bit. I think we need to follow-up but I am not alarmed.   Let's plan to get another PSA level when we check your blood counts in 3 months- I will order these tests, please schedule with the lab at your convenience to have these drawn.   I will also set up a liver ultrasound for you.    Always great to see you! Take care- let me know if any questions!

## 2018-02-20 ENCOUNTER — Other Ambulatory Visit: Payer: Self-pay | Admitting: Family Medicine

## 2018-02-22 ENCOUNTER — Encounter: Payer: Self-pay | Admitting: Family Medicine

## 2018-02-22 ENCOUNTER — Ambulatory Visit (INDEPENDENT_AMBULATORY_CARE_PROVIDER_SITE_OTHER): Payer: Medicare Other | Admitting: Family Medicine

## 2018-02-22 VITALS — BP 130/80 | HR 83 | Resp 16 | Ht 70.0 in | Wt 222.0 lb

## 2018-02-22 DIAGNOSIS — Z125 Encounter for screening for malignant neoplasm of prostate: Secondary | ICD-10-CM | POA: Diagnosis not present

## 2018-02-22 DIAGNOSIS — R972 Elevated prostate specific antigen [PSA]: Secondary | ICD-10-CM | POA: Diagnosis not present

## 2018-02-22 DIAGNOSIS — Z23 Encounter for immunization: Secondary | ICD-10-CM | POA: Diagnosis not present

## 2018-02-22 DIAGNOSIS — G250 Essential tremor: Secondary | ICD-10-CM | POA: Diagnosis not present

## 2018-02-22 DIAGNOSIS — R740 Nonspecific elevation of levels of transaminase and lactic acid dehydrogenase [LDH]: Secondary | ICD-10-CM | POA: Diagnosis not present

## 2018-02-22 DIAGNOSIS — D696 Thrombocytopenia, unspecified: Secondary | ICD-10-CM | POA: Diagnosis not present

## 2018-02-22 DIAGNOSIS — E785 Hyperlipidemia, unspecified: Secondary | ICD-10-CM | POA: Diagnosis not present

## 2018-02-22 DIAGNOSIS — R7401 Elevation of levels of liver transaminase levels: Secondary | ICD-10-CM

## 2018-02-22 DIAGNOSIS — Z0000 Encounter for general adult medical examination without abnormal findings: Secondary | ICD-10-CM

## 2018-02-22 DIAGNOSIS — Z7901 Long term (current) use of anticoagulants: Secondary | ICD-10-CM | POA: Diagnosis not present

## 2018-02-22 DIAGNOSIS — I10 Essential (primary) hypertension: Secondary | ICD-10-CM

## 2018-02-22 MED ORDER — PROPRANOLOL HCL 40 MG PO TABS: 40.0000 mg | ORAL_TABLET | Freq: Two times a day (BID) | ORAL | 3 refills | Status: DC

## 2018-02-22 NOTE — Patient Instructions (Addendum)
It was great to see you today as always- I will be in touch with your labs asap  Continue to exercise and stay active, you are doing a great job taking care of yourself   You got your first Shingrix shingles vaccine today- 2nd dose due in 2-6 months    Health Maintenance, Male A healthy lifestyle and preventive care is important for your health and wellness. Ask your health care provider about what schedule of regular examinations is right for you. What should I know about weight and diet? Eat a Healthy Diet  Eat plenty of vegetables, fruits, whole grains, low-fat dairy products, and lean protein.  Do not eat a lot of foods high in solid fats, added sugars, or salt.  Maintain a Healthy Weight Regular exercise can help you achieve or maintain a healthy weight. You should:  Do at least 150 minutes of exercise each week. The exercise should increase your heart rate and make you sweat (moderate-intensity exercise).  Do strength-training exercises at least twice a week.  Watch Your Levels of Cholesterol and Blood Lipids  Have your blood tested for lipids and cholesterol every 5 years starting at 81 years of age. If you are at high risk for heart disease, you should start having your blood tested when you are 81 years old. You may need to have your cholesterol levels checked more often if: ? Your lipid or cholesterol levels are high. ? You are older than 81 years of age. ? You are at high risk for heart disease.  What should I know about cancer screening? Many types of cancers can be detected early and may often be prevented. Lung Cancer  You should be screened every year for lung cancer if: ? You are a current smoker who has smoked for at least 30 years. ? You are a former smoker who has quit within the past 15 years.  Talk to your health care provider about your screening options, when you should start screening, and how often you should be screened.  Colorectal Cancer  Routine  colorectal cancer screening usually begins at 81 years of age and should be repeated every 5-10 years until you are 81 years old. You may need to be screened more often if early forms of precancerous polyps or small growths are found. Your health care provider may recommend screening at an earlier age if you have risk factors for colon cancer.  Your health care provider may recommend using home test kits to check for hidden blood in the stool.  A small camera at the end of a tube can be used to examine your colon (sigmoidoscopy or colonoscopy). This checks for the earliest forms of colorectal cancer.  Prostate and Testicular Cancer  Depending on your age and overall health, your health care provider may do certain tests to screen for prostate and testicular cancer.  Talk to your health care provider about any symptoms or concerns you have about testicular or prostate cancer.  Skin Cancer  Check your skin from head to toe regularly.  Tell your health care provider about any new moles or changes in moles, especially if: ? There is a change in a mole's size, shape, or color. ? You have a mole that is larger than a pencil eraser.  Always use sunscreen. Apply sunscreen liberally and repeat throughout the day.  Protect yourself by wearing long sleeves, pants, a wide-brimmed hat, and sunglasses when outside.  What should I know about heart disease, diabetes, and  high blood pressure?  If you are 37-25 years of age, have your blood pressure checked every 3-5 years. If you are 62 years of age or older, have your blood pressure checked every year. You should have your blood pressure measured twice-once when you are at a hospital or clinic, and once when you are not at a hospital or clinic. Record the average of the two measurements. To check your blood pressure when you are not at a hospital or clinic, you can use: ? An automated blood pressure machine at a pharmacy. ? A home blood pressure  monitor.  Talk to your health care provider about your target blood pressure.  If you are between 14-62 years old, ask your health care provider if you should take aspirin to prevent heart disease.  Have regular diabetes screenings by checking your fasting blood sugar level. ? If you are at a normal weight and have a low risk for diabetes, have this test once every three years after the age of 36. ? If you are overweight and have a high risk for diabetes, consider being tested at a younger age or more often.  A one-time screening for abdominal aortic aneurysm (AAA) by ultrasound is recommended for men aged 58-75 years who are current or former smokers. What should I know about preventing infection? Hepatitis B If you have a higher risk for hepatitis B, you should be screened for this virus. Talk with your health care provider to find out if you are at risk for hepatitis B infection. Hepatitis C Blood testing is recommended for:  Everyone born from [birth year] through 1963.  Anyone with known risk factors for hepatitis C.  Sexually Transmitted Diseases (STDs)  You should be screened each year for STDs including gonorrhea and chlamydia if: ? You are sexually active and are younger than 81 years of age. ? You are older than 81 years of age and your health care provider tells you that you are at risk for this type of infection. ? Your sexual activity has changed since you were last screened and you are at an increased risk for chlamydia or gonorrhea. Ask your health care provider if you are at risk.  Talk with your health care provider about whether you are at high risk of being infected with HIV. Your health care provider may recommend a prescription medicine to help prevent HIV infection.  What else can I do?  Schedule regular health, dental, and eye exams.  Stay current with your vaccines (immunizations).  Do not use any tobacco products, such as cigarettes, chewing tobacco, and  e-cigarettes. If you need help quitting, ask your health care provider.  Limit alcohol intake to no more than 2 drinks per day. One drink equals 12 ounces of beer, 5 ounces of wine, or 1 ounces of hard liquor.  Do not use street drugs.  Do not share needles.  Ask your health care provider for help if you need support or information about quitting drugs.  Tell your health care provider if you often feel depressed.  Tell your health care provider if you have ever been abused or do not feel safe at home. This information is not intended to replace advice given to you by your health care provider. Make sure you discuss any questions you have with your health care provider. Document Released: 01/31/2008 Document Revised: 04/02/2016 Document Reviewed: 05/08/2015 Elsevier Interactive Patient Education  [employer].

## 2018-02-23 ENCOUNTER — Encounter: Payer: Self-pay | Admitting: Family Medicine

## 2018-02-23 LAB — COMPREHENSIVE METABOLIC PANEL
ALK PHOS: 48 U/L (ref 39–117)
ALT: 65 U/L — ABNORMAL HIGH (ref 0–53)
AST: 48 U/L — ABNORMAL HIGH (ref 0–37)
Albumin: 4.4 g/dL (ref 3.5–5.2)
BUN: 14 mg/dL (ref 6–23)
CO2: 28 mEq/L (ref 19–32)
Calcium: 9.7 mg/dL (ref 8.4–10.5)
Chloride: 98 mEq/L (ref 96–112)
Creatinine, Ser: 0.83 mg/dL (ref 0.40–1.50)
GFR: 94.42 mL/min (ref >60.00)
Glucose, Bld: 101 mg/dL — ABNORMAL HIGH (ref 70–99)
POTASSIUM: 3.8 meq/L (ref 3.5–5.1)
SODIUM: 135 meq/L (ref 135–145)
Total Bilirubin: 2.3 mg/dL — ABNORMAL HIGH (ref 0.2–1.2)
Total Protein: 7.8 g/dL (ref 6.0–8.3)

## 2018-02-23 LAB — LIPID PANEL
Cholesterol: 248 mg/dL — ABNORMAL HIGH (ref 0–200)
HDL: 40.8 mg/dL (ref >39.00)
NonHDL: 207.03
TRIGLYCERIDES: 243 mg/dL — AB (ref 0.0–149.0)
Total CHOL/HDL Ratio: 6
VLDL: 48.6 mg/dL — AB (ref 0.0–40.0)

## 2018-02-23 LAB — CBC
HCT: 46.6 % (ref 39.0–52.0)
Hemoglobin: 16.3 g/dL (ref 13.0–17.0)
MCHC: 34.9 g/dL (ref 30.0–36.0)
MCV: 97.2 fl (ref 78.0–100.0)
Platelets: 118 10*3/uL — ABNORMAL LOW (ref 150.0–400.0)
RBC: 4.79 Mil/uL (ref 4.22–5.81)
RDW: 13.3 % (ref 11.5–15.5)
WBC: 6.6 10*3/uL (ref 4.0–10.5)

## 2018-02-23 LAB — PSA: PSA: 2.04 ng/mL (ref 0.10–4.00)

## 2018-02-23 LAB — LDL CHOLESTEROL, DIRECT: LDL DIRECT: 161 mg/dL

## 2018-02-23 NOTE — Addendum Note (Signed)
Addended by: Lamar Blinks C on: 02/23/2018 06:25 PM   Modules accepted: Orders

## 2018-03-02 ENCOUNTER — Ambulatory Visit (HOSPITAL_BASED_OUTPATIENT_CLINIC_OR_DEPARTMENT_OTHER)
Admission: RE | Admit: 2018-03-02 | Discharge: 2018-03-02 | Disposition: A | Discharge status: Home / Self Care | Payer: Medicare Other | Source: Ambulatory Visit | Attending: Family Medicine | Admitting: Family Medicine

## 2018-03-02 DIAGNOSIS — R740 Nonspecific elevation of levels of transaminase and lactic acid dehydrogenase [LDH]: Secondary | ICD-10-CM | POA: Diagnosis not present

## 2018-03-02 DIAGNOSIS — R932 Abnormal findings on diagnostic imaging of liver and biliary tract: Secondary | ICD-10-CM | POA: Insufficient documentation

## 2018-03-02 DIAGNOSIS — R7989 Other specified abnormal findings of blood chemistry: Secondary | ICD-10-CM | POA: Diagnosis not present

## 2018-03-02 DIAGNOSIS — R7401 Elevation of levels of liver transaminase levels: Secondary | ICD-10-CM

## 2018-03-02 DIAGNOSIS — Z9049 Acquired absence of other specified parts of digestive tract: Secondary | ICD-10-CM | POA: Insufficient documentation

## 2018-03-04 ENCOUNTER — Encounter: Payer: Self-pay | Admitting: Family Medicine

## 2018-03-09 ENCOUNTER — Ambulatory Visit (INDEPENDENT_AMBULATORY_CARE_PROVIDER_SITE_OTHER): Payer: Medicare Other | Admitting: General Practice

## 2018-03-09 DIAGNOSIS — Z86718 Personal history of other venous thrombosis and embolism: Secondary | ICD-10-CM

## 2018-03-09 DIAGNOSIS — Z7901 Long term (current) use of anticoagulants: Secondary | ICD-10-CM | POA: Diagnosis not present

## 2018-03-09 LAB — POCT INR: INR: 2.4 (ref 2.0–3.0)

## 2018-03-09 NOTE — Patient Instructions (Addendum)
Pre visit review using our clinic review tool, if applicable. No additional management support is needed unless otherwise documented below in the visit note.  Continue to take 1/2 tablet all days except 1 tablet on Tuesday and Saturdays.  Re-check in 6 weeks.

## 2018-03-16 DIAGNOSIS — M1712 Unilateral primary osteoarthritis, left knee: Secondary | ICD-10-CM | POA: Diagnosis not present

## 2018-04-15 ENCOUNTER — Other Ambulatory Visit: Payer: Self-pay | Admitting: Family Medicine

## 2018-04-15 DIAGNOSIS — M62838 Other muscle spasm: Secondary | ICD-10-CM

## 2018-04-15 NOTE — Telephone Encounter (Signed)
Requesting:Valium Contract:none, needs csc FCZ:[id number], needs uds Last Visit:02/22/18 Next Visit:none with pcp Last Refill:01/07/18  Please Advise

## 2018-04-20 ENCOUNTER — Ambulatory Visit (INDEPENDENT_AMBULATORY_CARE_PROVIDER_SITE_OTHER): Payer: Medicare Other | Admitting: General Practice

## 2018-04-20 DIAGNOSIS — Z86718 Personal history of other venous thrombosis and embolism: Secondary | ICD-10-CM

## 2018-04-20 DIAGNOSIS — Z7901 Long term (current) use of anticoagulants: Secondary | ICD-10-CM | POA: Diagnosis not present

## 2018-04-20 DIAGNOSIS — Z23 Encounter for immunization: Secondary | ICD-10-CM

## 2018-04-20 LAB — POCT INR: INR: 2.1 (ref 2.0–3.0)

## 2018-04-20 NOTE — Patient Instructions (Signed)
.  Pre visit review using our clinic review tool, if applicable. No additional management support is needed unless otherwise documented below in the visit note.  Continue to take 1/2 tablet all days except 1 tablet on Tuesday and Saturdays.  Re-check in 6 weeks.

## 2018-04-29 ENCOUNTER — Encounter: Payer: Self-pay | Admitting: Family Medicine

## 2018-05-07 ENCOUNTER — Other Ambulatory Visit: Payer: Self-pay | Admitting: Family Medicine

## 2018-05-19 ENCOUNTER — Encounter: Payer: Self-pay | Admitting: Family Medicine

## 2018-05-20 ENCOUNTER — Encounter: Payer: Self-pay | Admitting: Family Medicine

## 2018-06-01 ENCOUNTER — Ambulatory Visit (INDEPENDENT_AMBULATORY_CARE_PROVIDER_SITE_OTHER): Payer: Medicare Other | Admitting: General Practice

## 2018-06-01 DIAGNOSIS — Z86718 Personal history of other venous thrombosis and embolism: Secondary | ICD-10-CM

## 2018-06-01 DIAGNOSIS — Z7901 Long term (current) use of anticoagulants: Secondary | ICD-10-CM | POA: Diagnosis not present

## 2018-06-01 LAB — POCT INR: INR: 2.5 (ref 2.0–3.0)

## 2018-06-01 NOTE — Patient Instructions (Addendum)
Pre visit review using our clinic review tool, if applicable. No additional management support is needed unless otherwise documented below in the visit note.  Continue to take 1/2 tablet all days except 1 tablet on Tuesday and Saturdays.  Re-check in 6 weeks.

## 2018-07-06 ENCOUNTER — Ambulatory Visit (INDEPENDENT_AMBULATORY_CARE_PROVIDER_SITE_OTHER): Payer: Medicare Other | Admitting: General Practice

## 2018-07-06 DIAGNOSIS — Z86718 Personal history of other venous thrombosis and embolism: Secondary | ICD-10-CM

## 2018-07-06 DIAGNOSIS — Z7901 Long term (current) use of anticoagulants: Secondary | ICD-10-CM

## 2018-07-06 LAB — POCT INR: INR: 2 (ref 2.0–3.0)

## 2018-07-06 NOTE — Patient Instructions (Addendum)
Pre visit review using our clinic review tool, if applicable. No additional management support is needed unless otherwise documented below in the visit note.  Continue to take 1/2 tablet all days except 1 tablet on Tuesday and Saturdays.  Re-check in 6 weeks.

## 2018-08-14 ENCOUNTER — Other Ambulatory Visit: Payer: Self-pay | Admitting: Family Medicine

## 2018-08-24 ENCOUNTER — Ambulatory Visit (INDEPENDENT_AMBULATORY_CARE_PROVIDER_SITE_OTHER): Payer: Medicare Other | Admitting: General Practice

## 2018-08-24 DIAGNOSIS — Z7901 Long term (current) use of anticoagulants: Secondary | ICD-10-CM

## 2018-08-24 DIAGNOSIS — Z86718 Personal history of other venous thrombosis and embolism: Secondary | ICD-10-CM

## 2018-08-24 LAB — POCT INR: INR: 2.1 (ref 2.0–3.0)

## 2018-08-24 NOTE — Patient Instructions (Signed)
Pre visit review using our clinic review tool, if applicable. No additional management support is needed unless otherwise documented below in the visit note.  Continue to take 1/2 tablet all days except 1 tablet on Tuesday and Saturdays.  Re-check in 6 weeks.

## 2018-09-06 ENCOUNTER — Encounter: Payer: Self-pay | Admitting: Family Medicine

## 2018-09-06 ENCOUNTER — Other Ambulatory Visit: Payer: Self-pay | Admitting: Family Medicine

## 2018-09-06 DIAGNOSIS — M62838 Other muscle spasm: Secondary | ICD-10-CM

## 2018-09-06 MED ORDER — WARFARIN SODIUM 5 MG PO TABS: ORAL_TABLET | ORAL | 3 refills | Status: DC

## 2018-09-06 MED ORDER — DIAZEPAM 5 MG PO TABS: 2.5000 mg | ORAL_TABLET | Freq: Every evening | ORAL | 2 refills | Status: DC | PRN

## 2018-09-06 MED ORDER — PANTOPRAZOLE SODIUM 40 MG PO TBEC: 40.0000 mg | DELAYED_RELEASE_TABLET | Freq: Every day | ORAL | 3 refills | Status: DC

## 2018-09-15 ENCOUNTER — Encounter: Payer: Self-pay | Admitting: Family Medicine

## 2018-09-16 ENCOUNTER — Other Ambulatory Visit: Payer: Self-pay

## 2018-09-16 MED ORDER — ZOSTER VAC RECOMB ADJUVANTED 50 MCG/0.5ML IM SUSR: 0.5000 mL | Freq: Once | INTRAMUSCULAR | 1 refills | Status: AC

## 2018-09-16 MED ORDER — ZOSTER VAC RECOMB ADJUVANTED 50 MCG/0.5ML IM SUSR: 0.5000 mL | Freq: Once | INTRAMUSCULAR | 1 refills | Status: DC

## 2018-10-05 ENCOUNTER — Ambulatory Visit (INDEPENDENT_AMBULATORY_CARE_PROVIDER_SITE_OTHER): Payer: Medicare Other | Admitting: General Practice

## 2018-10-05 DIAGNOSIS — Z7901 Long term (current) use of anticoagulants: Secondary | ICD-10-CM | POA: Diagnosis not present

## 2018-10-05 DIAGNOSIS — Z86718 Personal history of other venous thrombosis and embolism: Secondary | ICD-10-CM

## 2018-10-05 LAB — POCT INR: INR: 2 (ref 2.0–3.0)

## 2018-10-05 NOTE — Patient Instructions (Addendum)
Pre visit review using our clinic review tool, if applicable. No additional management support is needed unless otherwise documented below in the visit note.  Continue to take 1/2 tablet all days except 1 tablet on Tuesday and Saturdays.  Re-check in 6 weeks.

## 2018-11-06 ENCOUNTER — Encounter: Payer: Self-pay | Admitting: Family Medicine

## 2018-11-16 ENCOUNTER — Ambulatory Visit (INDEPENDENT_AMBULATORY_CARE_PROVIDER_SITE_OTHER): Payer: Medicare Other | Admitting: General Practice

## 2018-11-16 DIAGNOSIS — Z7901 Long term (current) use of anticoagulants: Secondary | ICD-10-CM

## 2018-11-16 DIAGNOSIS — Z86718 Personal history of other venous thrombosis and embolism: Secondary | ICD-10-CM

## 2018-11-16 LAB — POCT INR: INR: 2 (ref 2.0–3.0)

## 2018-11-16 NOTE — Patient Instructions (Signed)
Pre visit review using our clinic review tool, if applicable. No additional management support is needed unless otherwise documented below in the visit note.  Continue to take 1/2 tablet all days except 1 tablet on Tuesday and Saturdays.  Re-check in 6 weeks.

## 2018-12-08 ENCOUNTER — Ambulatory Visit: Payer: Medicare Other | Admitting: *Deleted

## 2018-12-10 ENCOUNTER — Encounter: Payer: Self-pay | Admitting: Family Medicine

## 2018-12-13 NOTE — Progress Notes (Addendum)
Virtual Visit via Video Note  I connected with patient on 12/14/18 at 10:00 AM EDT by a video enabled telemedicine application and verified that I am speaking with the correct person using two identifiers.   THIS ENCOUNTER IS A VIRTUAL VISIT DUE TO COVID-19 - PATIENT WAS NOT SEEN IN THE OFFICE. PATIENT HAS CONSENTED TO VIRTUAL VISIT / TELEMEDICINE VISIT   Location of patient: home  Location of provider: office  I discussed the limitations of evaluation and management by telemedicine and the availability of in person appointments. The patient expressed understanding and agreed to proceed.   Subjective:   Patrick Hatfield is a 84 y.o. male who presents for Medicare Annual/Subsequent preventive examination.  Review of Systems: No ROS.  Medicare Wellness Visit. Additional risk factors are reflected in the social history. Cardiac Risk Factors include: advanced age (>63men, >80 women);hypertension;male gender Sleep patterns: Valium as needed. Wakes once to urinate. Sleeps about 6 hrs total. Naps 52min daily. Home Safety/Smoke Alarms: Feels safe in home. Smoke alarms in place.  Lives with wife and  1 dog in 1 story townhouse.   Male:    PSA-  Lab Results  Component Value Date   PSA 2.04 02/22/2018   PSA 1.46 02/08/2014   PSA 1.59 01/05/2013       Objective:    Vitals: Unable to assess. This visit is enabled though telemedicine due to Covid 19. Pt reports BP=132/82. HR=60.  Advanced Directives 12/14/2018 12/03/2017 09/26/2016 10/09/2015 09/18/2014  Does Patient Have a Medical Advance Directive? Yes Yes Yes No;Yes Yes  Type of Paramedic of [place];Living will [place];Living will [place];Living will [place];Living will -  Does patient want to make changes to medical advance directive? No - Patient declined No - Patient declined - No - Patient declined -  Copy of [place] in Chart? No  - copy requested No - copy requested No - copy requested No - copy requested -    Tobacco Social History   Tobacco Use  Smoking Status Former Smoker  . Packs/day: 2.00  . Years: 40.00  . Pack years: 80.00  . Types: Cigarettes  . Last attempt to quit: 08/18/1994  . Years since quitting: 24.3  Smokeless Tobacco Never Used  Tobacco Comment   smoked 1956-1996, up to 2 ppd     Counseling given: Not Answered Comment: smoked 1956-1996, up to 2 ppd   Clinical Intake: Pain : No/denies pain   Past Medical History:  Diagnosis Date  . Anxiety   . Arthritis    KNEES,ANKLES,HANDS  . Asthma    AS A TEENAGER  . Barrett's esophagus   . Benign essential tremor   . Clotting disorder ([place])    FACTOR 5  . Colitis in 20's  . Colon polyps   . Congenital deficiency of other clotting factors    factor 5  . Depression 2006   drug overdose  . Diverticulosis   . DVT (deep venous thrombosis) ([place]) 1991 & 2010   X 2 in context Factor 5 def  . Esophageal reflux   . Factor 5 Leiden mutation, heterozygous ([place])   . Gilbert's syndrome   . H/O ischemic bowel disease 2010   while off warfarin   . Herpes zoster 2010   facial  . Hiatal hernia   . Hx of adenomatous colonic polyps 12/14/2014  . Hyperlipidemia   . Hypertension   . Insomnia   . Osteoporosis   .  Personal history of colonic polyps 05/28/2007   adenomatous  . Proteinuria age 87  . 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 Umbilical hernia    Past Surgical History:  Procedure Laterality Date  . CHOLECYSTECTOMY  05/16/2011    Dr Marlou Starks  . COLONOSCOPY     Dr Sharlett Iles  . CYSTOSCOPY     Neg  . EYE SURGERY Bilateral 10/16/2017   secondary catarct surgery.  Marland Kitchen KNEE ARTHROSCOPY Bilateral 2008, 2012   Dr.Aplington bilateral  . TONSILLECTOMY    . TRANSURETHRAL RESECTION OF PROSTATE      BPH;Dr Dahlstedt   Family History  Problem Relation Age of Onset  . Pancreatic cancer Father 51  . Ulcers Father   . Stroke  Father 76  . Diabetes Father   . Coronary artery disease Mother   . Osteoporosis Mother   . HIV Brother   . Multiple sclerosis Sister   . Heart attack Paternal Grandfather        early 29s  . Heart attack Paternal Uncle        2 uncles in early 31s  . Aortic aneurysm Brother   . Colon cancer Neg Hx   . Esophageal cancer Neg Hx   . Stomach cancer Neg Hx    Social History   Socioeconomic History  . Marital status: Married    Spouse name: Not on file  . Number of children: 4  . Years of education: Not on file  . Highest education level: Not on file  Occupational History  . Occupation: Retired    Fish farm manager: RETIRED  Social Needs  . Financial resource strain: Not on file  . Food insecurity:    Worry: Not on file    Inability: Not on file  . Transportation needs:    Medical: Not on file    Non-medical: Not on file  Tobacco Use  . Smoking status: Former Smoker    Packs/day: 2.00    Years: 40.00    Pack years: 80.00    Types: Cigarettes    Last attempt to quit: 08/18/1994    Years since quitting: 24.3  . Smokeless tobacco: Never Used  . Tobacco comment: smoked 1956-1996, up to 2 ppd  Substance and Sexual Activity  . Alcohol use: Not Currently  . Drug use: No  . Sexual activity: Not Currently  Lifestyle  . Physical activity:    Days per week: Not on file    Minutes per session: Not on file  . Stress: Not on file  Relationships  . Social connections:    Talks on phone: Not on file    Gets together: Not on file    Attends religious service: Not on file    Active member of club or organization: Not on file    Attends meetings of clubs or organizations: Not on file    Relationship status: Not on file  Other Topics Concern  . Not on file  Social History Narrative   Married, lives with spouse   Married x56 years   4 daughters   4 grandchildren   Retired - Armed forces operational officer, sales/technology    Outpatient Encounter Medications as of 12/14/2018  Medication Sig  .  acetaminophen (TYLENOL) 325 MG tablet Per bottle as needed  . cholecalciferol (VITAMIN D) 1000 UNITS tablet Take 1,000 Units by mouth daily.    . diazepam (VALIUM) 5 MG tablet Take 0.5-1 tablets (2.5-5 mg total) by mouth at bedtime as needed. for sleep  .  Fluticasone Propionate (FLONASE NA) Place into the nose as needed.  . folic acid (FOLVITE) 081 MCG tablet Take 400 mcg by mouth daily.    Marland Kitchen gabapentin (NEURONTIN) 300 MG capsule TAKE 1 OR 2 CAPSULES BY MOUTH AT BEDTIME AS NEEDED  . losartan-hydrochlorothiazide (HYZAAR) 100-12.5 MG tablet TAKE 1 TABLET BY MOUTH EVERY DAY  . Omega-3 Fatty Acids (FISH OIL) 1200 MG CAPS Take 1,200 mg by mouth 2 (two) times daily.    . pantoprazole (PROTONIX) 40 MG tablet Take 1 tablet (40 mg total) by mouth daily.  . propranolol (INDERAL) 40 MG tablet Take 1 tablet (40 mg total) by mouth 2 (two) times daily.  Marland Kitchen warfarin (COUMADIN) 5 MG tablet TAKE AS DIRECTED BY ANTICOAGULATION CLINIC   No facility-administered encounter medications on file as of 12/14/2018.     Activities of Daily Living In your present state of health, do you have any difficulty performing the following activities: 12/14/2018  Hearing? N  Vision? N  Difficulty concentrating or making decisions? N  Walking or climbing stairs? N  Dressing or bathing? N  Doing errands, shopping? N  Preparing Food and eating ? N  Using the Toilet? N  In the past six months, have you accidently leaked urine? N  Do you have problems with loss of bowel control? N  Managing your Medications? N  Managing your Finances? N  Housekeeping or managing your Housekeeping? N  Some recent data might be hidden    Patient Care Team: Copland, Gay Filler, MD as PCP - General (Family Medicine) Latanya Maudlin, MD as Consulting Physician (Orthopedic Surgery) Gatha Mayer, MD as Consulting Physician (Gastroenterology) Marshell Garfinkel, MD as Consulting Physician (Pulmonary Disease)   Assessment:   This is a routine  wellness examination for [place]. Marland Kitchennocpe  Exercise Activities and Dietary recommendations Current Exercise Habits: Home exercise routine, Time (Minutes): 15, Frequency (Times/Week): 7, Weekly Exercise (Minutes/Week): 105, Exercise limited by: None identified Diet (meal preparation, eat out, water intake, caffeinated beverages, dairy products, fruits and vegetables): well balanced, on average, 2 meals per day    Goals    . Maintain current health status    . Maintain healthy active lifestyle.        Fall Risk Fall Risk  12/14/2018 12/03/2017 09/26/2016 01/17/2016 10/09/2015  Falls in the past year? 0 No No No No   Depression Screen PHQ 2/9 Scores 12/14/2018 12/03/2017 09/26/2016 01/17/2016  PHQ - 2 Score 0 0 0 0    Cognitive Function Ad8 score reviewed for issues:  Issues making decisions:no  Less interest in hobbies / activities:no  Repeats questions, stories (family complaining):no  Trouble using ordinary gadgets (microwave, computer, phone):no  Forgets the month or year: no  Mismanaging finances: no  Remembering appts:no  Daily problems with thinking and/or memory:no Ad8 score is=0     MMSE - Mini Mental State Exam 09/26/2016  Orientation to time 4  Orientation to Place 5  Registration 3  Attention/ Calculation 5  Recall 3  Language- name 2 objects 2  Language- repeat 1  Language- follow 3 step command 3  Language- read & follow direction 1  Write a sentence 1  Copy design 1  Total score 29        Immunization History  Administered Date(s) Administered  . DT 02/16/2015  . Influenza Split 05/03/2012  . Influenza Whole 07/24/2010  . Influenza, High Dose Seasonal PF 05/20/2013, 05/09/2016, 05/15/2017, 04/20/2018  . Influenza,inj,Quad PF,6+ Mos 05/03/2014, 05/16/2015  . Pneumococcal Conjugate-13 09/05/2015  .  Pneumococcal Polysaccharide-23 09/26/2016  . Td 03/03/2015  . Tdap 03/08/2015  . Zoster Recombinat (Shingrix) 02/22/2018, 10/05/2018    Screening Tests  Health Maintenance  Topic Date Due  . INFLUENZA VACCINE  03/19/2019  . TETANUS/TDAP  03/07/2025  . PNA vac Low Risk Adult  Completed      Plan:   See you next year!!  Continue to eat heart healthy diet (full of fruits, vegetables, whole grains, lean protein, water--limit salt, fat, and sugar intake) and increase physical activity as tolerated.  Continue doing brain stimulating activities (puzzles, reading, adult coloring books, staying active) to keep memory sharp.   Bring a copy of your living will and/or healthcare power of attorney to your next office visit.   I have personally reviewed and noted the following in the patient's chart:   . Medical and social history . Use of alcohol, tobacco or illicit drugs  . Current medications and supplements . Functional ability and status . Nutritional status . Physical activity Advanced directives . List of other physicians . Hospitalizations, surgeries, and ER visits in previous 12 months . Vitals . Screenings to include cognitive, depression, and falls . Referrals and appointments  In addition, I have reviewed and discussed with patient certain preventive protocols, quality metrics, and best practice recommendations. A written personalized care plan for preventive services as well as general preventive health recommendations were provided to patient.     Shela Nevin, RN  12/14/2018  I have read the above MWE note by Ms. Vevelyn Royals and agree with her documentation Denny Peon MD

## 2018-12-14 ENCOUNTER — Other Ambulatory Visit: Payer: Self-pay

## 2018-12-14 ENCOUNTER — Encounter: Payer: Self-pay | Admitting: *Deleted

## 2018-12-14 ENCOUNTER — Ambulatory Visit (INDEPENDENT_AMBULATORY_CARE_PROVIDER_SITE_OTHER): Payer: Medicare Other | Admitting: *Deleted

## 2018-12-14 DIAGNOSIS — Z0000 Encounter for general adult medical examination without abnormal findings: Secondary | ICD-10-CM

## 2018-12-14 NOTE — Patient Instructions (Signed)
See you next year!!  Continue to eat heart healthy diet (full of fruits, vegetables, whole grains, lean protein, water--limit salt, fat, and sugar intake) and increase physical activity as tolerated.  Continue doing brain stimulating activities (puzzles, reading, adult coloring books, staying active) to keep memory sharp.   Bring a copy of your living will and/or healthcare power of attorney to your next office visit.   Patrick Hatfield , Thank you for taking time to come for your Medicare Wellness Visit. I appreciate your ongoing commitment to your health goals. Please review the following plan we discussed and let me know if I can assist you in the future.   These are the goals we discussed: Goals    . Maintain current health status    . Maintain healthy active lifestyle.        This is a list of the screening recommended for you and due dates:  Health Maintenance  Topic Date Due  . Flu Shot  03/19/2019  . Tetanus Vaccine  03/07/2025  . Pneumonia vaccines  Completed     Health Maintenance After Age 38 After age 76, you are at a higher risk for certain long-term diseases and infections as well as injuries from falls. Falls are a major cause of broken bones and head injuries in people who are older than age 32. Getting regular preventive care can help to keep you healthy and well. Preventive care includes getting regular testing and making lifestyle changes as recommended by your health care provider. Talk with your health care provider about:  Which screenings and tests you should have. A screening is a test that checks for a disease when you have no symptoms.  A diet and exercise plan that is right for you. What should I know about screenings and tests to prevent falls? Screening and testing are the best ways to find a health problem early. Early diagnosis and treatment give you the best chance of managing medical conditions that are common after age 49. Certain conditions and  lifestyle choices may make you more likely to have a fall. Your health care provider may recommend:  Regular vision checks. Poor vision and conditions such as cataracts can make you more likely to have a fall. If you wear glasses, make sure to get your prescription updated if your vision changes.  Medicine review. Work with your health care provider to regularly review all of the medicines you are taking, including over-the-counter medicines. Ask your health care provider about any side effects that may make you more likely to have a fall. Tell your health care provider if any medicines that you take make you feel dizzy or sleepy.  Osteoporosis screening. Osteoporosis is a condition that causes the bones to get weaker. This can make the bones weak and cause them to break more easily.  Blood pressure screening. Blood pressure changes and medicines to control blood pressure can make you feel dizzy.  Strength and balance checks. Your health care provider may recommend certain tests to check your strength and balance while standing, walking, or changing positions.  Foot health exam. Foot pain and numbness, as well as not wearing proper footwear, can make you more likely to have a fall.  Depression screening. You may be more likely to have a fall if you have a fear of falling, feel emotionally low, or feel unable to do activities that you used to do.  Alcohol use screening. Using too much alcohol can affect your balance and may make  you more likely to have a fall. What actions can I take to lower my risk of falls? General instructions  Talk with your health care provider about your risks for falling. Tell your health care provider if: ? You fall. Be sure to tell your health care provider about all falls, even ones that seem minor. ? You feel dizzy, sleepy, or off-balance.  Take over-the-counter and prescription medicines only as told by your health care provider. These include any supplements.   Eat a healthy diet and maintain a healthy weight. A healthy diet includes low-fat dairy products, low-fat (lean) meats, and fiber from whole grains, beans, and lots of fruits and vegetables. Home safety  Remove any tripping hazards, such as rugs, cords, and clutter.  Install safety equipment such as grab bars in bathrooms and safety rails on stairs.  Keep rooms and walkways well-lit. Activity   Follow a regular exercise program to stay fit. This will help you maintain your balance. Ask your health care provider what types of exercise are appropriate for you.  If you need a cane or walker, use it as recommended by your health care provider.  Wear supportive shoes that have nonskid soles. Lifestyle  Do not drink alcohol if your health care provider tells you not to drink.  If you drink alcohol, limit how much you have: ? 0-1 drink a day for women. ? 0-2 drinks a day for men.  Be aware of how much alcohol is in your drink. In the [place] one drink equals one typical bottle of beer (12 oz), one-half glass of wine (5 oz), or one shot of hard liquor (1 oz).  Do not use any products that contain nicotine or tobacco, such as cigarettes and e-cigarettes. If you need help quitting, ask your health care provider. Summary  Having a healthy lifestyle and getting preventive care can help to protect your health and wellness after age 62.  Screening and testing are the best way to find a health problem early and help you avoid having a fall. Early diagnosis and treatment give you the best chance for managing medical conditions that are more common for people who are older than age 88.  Falls are a major cause of broken bones and head injuries in people who are older than age 34. Take precautions to prevent a fall at home.  Work with your health care provider to learn what changes you can make to improve your health and wellness and to prevent falls. This information is not intended to replace  advice given to you by your health care provider. Make sure you discuss any questions you have with your health care provider. Document Released: 06/17/2017 Document Revised: 06/17/2017 Document Reviewed: 06/17/2017 Elsevier Interactive Patient Education  2019 [employer].

## 2018-12-28 ENCOUNTER — Ambulatory Visit (INDEPENDENT_AMBULATORY_CARE_PROVIDER_SITE_OTHER): Payer: Medicare Other | Admitting: General Practice

## 2018-12-28 ENCOUNTER — Other Ambulatory Visit: Payer: Self-pay

## 2018-12-28 DIAGNOSIS — Z7901 Long term (current) use of anticoagulants: Secondary | ICD-10-CM

## 2018-12-28 DIAGNOSIS — Z86718 Personal history of other venous thrombosis and embolism: Secondary | ICD-10-CM

## 2018-12-28 LAB — POCT INR: INR: 2.7 (ref 2.0–3.0)

## 2018-12-28 NOTE — Patient Instructions (Addendum)
Pre visit review using our clinic review tool, if applicable. No additional management support is needed unless otherwise documented below in the visit note.  Continue to take 1/2 tablet all days except 1 tablet on Tuesday and Saturdays.  Re-check in 6 weeks.

## 2019-02-08 ENCOUNTER — Ambulatory Visit: Payer: Medicare Other

## 2019-02-15 ENCOUNTER — Other Ambulatory Visit: Payer: Self-pay

## 2019-02-15 ENCOUNTER — Ambulatory Visit (INDEPENDENT_AMBULATORY_CARE_PROVIDER_SITE_OTHER): Payer: Medicare Other | Admitting: General Practice

## 2019-02-15 DIAGNOSIS — Z7901 Long term (current) use of anticoagulants: Secondary | ICD-10-CM

## 2019-02-15 DIAGNOSIS — Z86718 Personal history of other venous thrombosis and embolism: Secondary | ICD-10-CM

## 2019-02-15 LAB — POCT INR: INR: 1.7 — AB (ref 2.0–3.0)

## 2019-02-15 NOTE — Patient Instructions (Addendum)
Pre visit review using our clinic review tool, if applicable. No additional management support is needed unless otherwise documented below in the visit note.  Take 1 1/2 tablets today and then continue to take 1/2 tablet all days except 1 tablet on Tuesday and Saturdays.  Re-check in 6 weeks.

## 2019-03-01 NOTE — Progress Notes (Addendum)
[facility] [address] [place], [place] [address] [phone]  Date:  03/02/2019   Name:  Patrick Hatfield   DOB:  11-03-1934   MRN:  [id number]  PCP:  Patrick Mclean, MD    Chief Complaint: Annual Exam (Pt reports fasting. States having night sweats and excessive urination.. States no burning, abd pressure, or lower back pain)   History of Present Illness:  Patrick Hatfield is a 84 y.o. very pleasant male patient who presents with the following:  Very nice older gentleman who is here today for a physical exam We saw each other most recently about 1 year ago for physical  History of hyperlipidemia, hypertension, factor V Leiden disorder on Coumadin, former smoker, tremor At our last visit his wife Patrick Hatfield had recently completed her treatment for leukemia.  She continues to be in remission- Dr. Marin Hatfield is taking care of her They have been married for 22 years, 4 daughters and 6 grandchildren All the grands are in college except the youngest who is a high school senior  Due for routine labs today- he is fasting for labs  PSA? He would like to do this His Coumadin is managed by Coumadin clinic-most recent visit on June 30 Former heavy smoker, but he is out of the window for screening exams No evidence of AAA on CT abdomen in 2018 He did have a CT of his chest in 2017 which was benign Has completed Shingrix  He has noted some urinary sx over the last several years- he thinks it may be "psychosomatic" No dysuria, No blood in his urine  He may get up once at night to urinate- however 2 days ago he got up 3x  He notes that when he hears the water running or hears the word water he will have an urge to urinate  He does tend to drink plenty of water and stays well hydrated   He has noted night sweats that might soak through his pajamas. He has to put a towel over his pillow He has noted this for a year or so, but  seems to be getting worse  May have a more severe sweat 2-3x a month  He is otherwise feeling very good- no cough, hemoptysis, unintended weight loss or SOB, no CP with exercise  He walks a few blocks daily with his dog- they are involved in [employer] rescue and currently have just one elderly dog at home   Wt Readings from Last 3 Encounters:  03/02/19 215 lb (97.5 kg)  02/22/18 222 lb (100.7 kg)  12/03/17 221 lb 12.8 oz (100.6 kg)    Home BP running 140s/90s in the am and 130s/70s in the afternoon  He takes losartan/hctz for BP and propranolol for tremor   Lab Results  Component Value Date   INR 1.7 (A) 02/15/2019   INR 2.7 12/28/2018   INR 2.0 11/16/2018   PROTIME 22.8 (H) 07/11/2013   PROTIME 30.0 (H) 07/13/2012   PROTIME 16.8 (H) 05/19/2011     Patient Active Problem List   Diagnosis Date Noted  . Hearing loss 01/26/2018  . Long term (current) use of anticoagulants 05/15/2017  . Encounter for therapeutic drug monitoring 12/05/2016  . Insomnia 09/05/2015  . Frequent loose stools 09/05/2015  . Hx of adenomatous colonic polyps 12/14/2014  . Hematuria, gross 02/03/2014  . 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 Thrombocytopenia, unspecified ([place]) 01/05/2013  .  Gilbert syndrome 01/05/2013  . Unspecified adverse effect of unspecified drug, medicinal and biological substance 01/09/2012  . Barrett's esophagus 03/24/2011  . Factor 5 Leiden mutation, heterozygous ([place]) 03/21/2011  . Essential tremor 04/24/2009  . HYPERLIPIDEMIA 10/03/2008  . Essential hypertension 10/03/2008  . GERD 10/03/2008  . Fasting hyperglycemia 10/03/2008  . DVT, HX OF 10/03/2008    Past Medical History:  Diagnosis Date  . Anxiety   . Arthritis    KNEES,ANKLES,HANDS  . Asthma    AS A TEENAGER  . Barrett's esophagus   . Benign essential tremor   . Clotting disorder ([place])    FACTOR 5  . Colitis in 20's  . Colon polyps   . Congenital deficiency of other clotting factors    factor 5  .  Depression 2006   drug overdose  . Diverticulosis   . DVT (deep venous thrombosis) ([place]) 1991 & 2010   X 2 in context Factor 5 def  . Esophageal reflux   . Factor 5 Leiden mutation, heterozygous ([place])   . Gilbert's syndrome   . H/O ischemic bowel disease 2010   while off warfarin   . Herpes zoster 2010   facial  . Hiatal hernia   . Hx of adenomatous colonic polyps 12/14/2014  . Hyperlipidemia   . Hypertension   . Insomnia   . Osteoporosis   . Personal history of colonic polyps 05/28/2007   adenomatous  . Proteinuria age 46  . 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 Umbilical hernia     Past Surgical History:  Procedure Laterality Date  . CHOLECYSTECTOMY  05/16/2011    Dr Patrick Hatfield  . COLONOSCOPY     Dr Patrick Hatfield  . CYSTOSCOPY     Neg  . EYE SURGERY Bilateral 10/16/2017   secondary catarct surgery.  Marland Kitchen KNEE ARTHROSCOPY Bilateral 2008, 2012   Dr.Aplington bilateral  . TONSILLECTOMY    . TRANSURETHRAL RESECTION OF PROSTATE      BPH;Dr Patrick Hatfield    Social History   Tobacco Use  . Smoking status: Former Smoker    Packs/day: 2.00    Years: 40.00    Pack years: 80.00    Types: Cigarettes    Quit date: 08/18/1994    Years since quitting: 24.5  . Smokeless tobacco: Never Used  . Tobacco comment: smoked 1956-1996, up to 2 ppd  Substance Use Topics  . Alcohol use: Not Currently  . Drug use: No    Family History  Problem Relation Age of Onset  . Pancreatic cancer Father 65  . Ulcers Father   . Stroke Father 47  . Diabetes Father   . Coronary artery disease Mother   . Osteoporosis Mother   . HIV Brother   . Multiple sclerosis Sister   . Heart attack Paternal Grandfather        early 73s  . Heart attack Paternal Uncle        2 uncles in early 80s  . Aortic aneurysm Brother   . Colon cancer Neg Hx   . Esophageal cancer Neg Hx   . Stomach cancer Neg Hx     Allergies  Allergen Reactions  . Amlodipine Besy-Benazepril Hcl     REACTION: swelling of  feet.  Probably  from Amlodipine component)  . Lipitor [Atorvastatin Calcium]     ? Reaction, ? Swelling=- update 7/20.  Pt recalls his legs hurt and perhaps swelled slightly - Patrick Hatfield    Medication list has been reviewed and updated.  Current Outpatient Medications on File Prior to Visit  Medication Sig Dispense Refill  . acetaminophen (TYLENOL) 325 MG tablet Per bottle as needed    . cholecalciferol (VITAMIN D) 1000 UNITS tablet Take 1,000 Units by mouth daily.      . diazepam (VALIUM) 5 MG tablet Take 0.5-1 tablets (2.5-5 mg total) by mouth at bedtime as needed. for sleep 30 tablet 2  . Fluticasone Propionate (FLONASE NA) Place into the nose as needed.    . folic acid (FOLVITE) 053 MCG tablet Take 400 mcg by mouth daily.      Marland Kitchen gabapentin (NEURONTIN) 300 MG capsule TAKE 1 OR 2 CAPSULES BY MOUTH AT BEDTIME AS NEEDED 180 capsule 1  . Omega-3 Fatty Acids (FISH OIL) 1200 MG CAPS Take 1,200 mg by mouth 2 (two) times daily.      . pantoprazole (PROTONIX) 40 MG tablet Take 1 tablet (40 mg total) by mouth daily. 90 tablet 3  . warfarin (COUMADIN) 5 MG tablet TAKE AS DIRECTED BY ANTICOAGULATION CLINIC 120 tablet 3   No current facility-administered medications on file prior to visit.     Review of Systems:  As per HPI- otherwise negative.   Physical Examination: Vitals:   03/02/19 1017 03/02/19 1035  BP: (!) 154/68 132/80  Pulse: 64   Resp: 18   Temp: 98.2 F (36.8 C)   SpO2: 97%    Vitals:   03/02/19 1017  Weight: 215 lb (97.5 kg)  Height: 5\' 10"  (1.778 m)   Body mass index is 30.85 kg/m. Ideal Body Weight: Weight in (lb) to have BMI = 25: 173.9  GEN: WDWN, NAD, Non-toxic, A & O x 3, essential tremor which is most evident as a head shake  HEENT: Atraumatic, Normocephalic. Neck supple. No masses, No LAD.  Bilateral TM wnl, oropharynx normal.  PEERL,EOMI.  Mild HOH Ears and Nose: No external deformity. CV: RRR, No M/G/R. No JVD. No thrill. No extra heart sounds. PULM: CTA B, no  wheezes, crackles, rhonchi. No retractions. No resp. distress. No accessory muscle use. ABD: S, NT, ND. No rebound. No HSM. EXTR: No c/c/e NEURO Normal gait.  PSYCH: Normally interactive. Conversant. Not depressed or anxious appearing.  Calm demeanor.  Prostate normal size for age and non- tender  Results for orders placed or performed in visit on 03/02/19  POCT urinalysis dipstick  Result Value Ref Range   Color, UA yellow yellow   Clarity, UA clear clear   Glucose, UA negative negative mg/dL   Bilirubin, UA negative negative   Ketones, POC UA negative negative mg/dL   Spec Grav, UA 1.015 1.010 - 1.025   Blood, UA negative negative   pH, UA 6.0 5.0 - 8.0   Protein Ur, POC negative negative mg/dL   Urobilinogen, UA 0.2 0.2 or 1.0 E.U./dL   Nitrite, UA Negative Negative   Leukocytes, UA Negative Negative    Assessment and Plan:   ICD-10-CM   1. Physical exam  Z00.00   2. Essential hypertension  I10   3. Dyslipidemia  E78.5 Lipid panel  4. Screening for prostate cancer  Z12.5 PSA  5. Factor 5 Leiden mutation, heterozygous ([place])  D68.51 CBC    Comprehensive metabolic panel  6. Gilbert syndrome  E80.4 Comprehensive metabolic panel  7. Fasting hyperglycemia  R73.01 Hemoglobin A1c  8. Urinary frequency  R35.0 Urine Culture    POCT urinalysis dipstick  9. Night sweat  R61 TSH    QuantiFERON-TB Gold Plus    HIV Antibody (  routine testing w rflx)  10. Essential tremor  G25.0 propranolol (INDERAL) 40 MG tablet   Following up for a physical and a couple of other concerns today Night sweats- labs as above Urinary frequency suspect BPH.  Await culture and assuming negative will discuss meds with him Coumadin treatment- [name] is getting tired of easy bruising. I will touch base with Dr. Marin Hatfield about any other anticoagulation options for him although I think he has to stay with coumadin BP is under ok control Tremor on BB Will plan further follow- up pending labs.   Follow-up: No  follow-ups on file.  Meds ordered this encounter  Medications  . losartan-hydrochlorothiazide (HYZAAR) 100-12.5 MG tablet    Sig: Take 1 tablet by mouth daily.    Dispense:  90 tablet    Refill:  3  . propranolol (INDERAL) 40 MG tablet    Sig: Take 1 tablet (40 mg total) by mouth 2 (two) times daily.    Dispense:  180 tablet    Refill:  3   Orders Placed This Encounter  Procedures  . Urine Culture  . CBC  . Comprehensive metabolic panel  . Hemoglobin A1c  . Lipid panel  . PSA  . TSH  . QuantiFERON-TB Gold Plus  . HIV Antibody (routine testing w rflx)  . POCT urinalysis dipstick    @SIGN @    Signed Lamar Blinks, MD   Received his labs so far 7/16, message to patient Known history of Rosanna Randy syndrome Results for orders placed or performed in visit on 03/02/19  CBC  Result Value Ref Range   WBC 6.7 4.0 - 10.5 K/uL   RBC 4.93 4.22 - 5.81 Mil/uL   Platelets 138.0 (L) 150.0 - 400.0 K/uL   Hemoglobin 16.4 13.0 - 17.0 g/dL   HCT 48.0 39.0 - 52.0 %   MCV 97.3 78.0 - 100.0 fl   MCHC 34.2 30.0 - 36.0 g/dL   RDW 14.8 11.5 - 15.5 %  Comprehensive metabolic panel  Result Value Ref Range   Sodium 134 (L) 135 - 145 mEq/L   Potassium 4.3 3.5 - 5.1 mEq/L   Chloride 98 96 - 112 mEq/L   CO2 27 19 - 32 mEq/L   Glucose, Bld 117 (H) 70 - 99 mg/dL   BUN 17 6 - 23 mg/dL   Creatinine, Ser 0.91 0.40 - 1.50 mg/dL   Total Bilirubin 2.7 (H) 0.2 - 1.2 mg/dL   Alkaline Phosphatase 61 39 - 117 U/L   AST 38 (H) 0 - 37 U/L   ALT 53 0 - 53 U/L   Total Protein 8.0 6.0 - 8.3 g/dL   Albumin 4.7 3.5 - 5.2 g/dL   Calcium 9.4 8.4 - 10.5 mg/dL   GFR 79.69 >60.00 mL/min  Hemoglobin A1c  Result Value Ref Range   Hgb A1c MFr Bld 5.2 4.6 - 6.5 %  Lipid panel  Result Value Ref Range   Cholesterol 245 (H) 0 - 200 mg/dL   Triglycerides 183.0 (H) 0.0 - 149.0 mg/dL   HDL 43.90 >39.00 mg/dL   VLDL 36.6 0.0 - 40.0 mg/dL   LDL Cholesterol 164 (H) 0 - 99 mg/dL   Total CHOL/HDL Ratio 6     NonHDL 200.99   PSA  Result Value Ref Range   PSA 2.02 0.10 - 4.00 ng/mL  TSH  Result Value Ref Range   TSH 1.54 0.35 - 4.50 uIU/mL  POCT urinalysis dipstick  Result Value Ref Range   Color, UA yellow  yellow   Clarity, UA clear clear   Glucose, UA negative negative mg/dL   Bilirubin, UA negative negative   Ketones, POC UA negative negative mg/dL   Spec Grav, UA 1.015 1.010 - 1.025   Blood, UA negative negative   pH, UA 6.0 5.0 - 8.0   Protein Ur, POC negative negative mg/dL   Urobilinogen, UA 0.2 0.2 or 1.0 E.U./dL   Nitrite, UA Negative Negative   Leukocytes, UA Negative Negative

## 2019-03-02 ENCOUNTER — Other Ambulatory Visit: Payer: Self-pay

## 2019-03-02 ENCOUNTER — Ambulatory Visit (INDEPENDENT_AMBULATORY_CARE_PROVIDER_SITE_OTHER): Payer: Medicare Other | Admitting: Family Medicine

## 2019-03-02 ENCOUNTER — Encounter: Payer: Self-pay | Admitting: Family Medicine

## 2019-03-02 VITALS — BP 132/80 | HR 64 | Temp 98.2°F | Resp 18 | Ht 70.0 in | Wt 215.0 lb

## 2019-03-02 DIAGNOSIS — R7301 Impaired fasting glucose: Secondary | ICD-10-CM

## 2019-03-02 DIAGNOSIS — E785 Hyperlipidemia, unspecified: Secondary | ICD-10-CM | POA: Diagnosis not present

## 2019-03-02 DIAGNOSIS — R61 Generalized hyperhidrosis: Secondary | ICD-10-CM | POA: Diagnosis not present

## 2019-03-02 DIAGNOSIS — I10 Essential (primary) hypertension: Secondary | ICD-10-CM

## 2019-03-02 DIAGNOSIS — R350 Frequency of micturition: Secondary | ICD-10-CM

## 2019-03-02 DIAGNOSIS — D6851 Activated protein C resistance: Secondary | ICD-10-CM

## 2019-03-02 DIAGNOSIS — Z0000 Encounter for general adult medical examination without abnormal findings: Secondary | ICD-10-CM

## 2019-03-02 DIAGNOSIS — G250 Essential tremor: Secondary | ICD-10-CM

## 2019-03-02 DIAGNOSIS — Z125 Encounter for screening for malignant neoplasm of prostate: Secondary | ICD-10-CM | POA: Diagnosis not present

## 2019-03-02 LAB — POCT URINALYSIS DIP (MANUAL ENTRY)
Bilirubin, UA: NEGATIVE
Blood, UA: NEGATIVE
Glucose, UA: NEGATIVE mg/dL
Ketones, POC UA: NEGATIVE mg/dL
Leukocytes, UA: NEGATIVE
Nitrite, UA: NEGATIVE
Protein Ur, POC: NEGATIVE mg/dL
Spec Grav, UA: 1.015 (ref 1.010–1.025)
Urobilinogen, UA: 0.2 E.U./dL
pH, UA: 6 (ref 5.0–8.0)

## 2019-03-02 LAB — COMPREHENSIVE METABOLIC PANEL
ALT: 53 U/L (ref 0–53)
AST: 38 U/L — ABNORMAL HIGH (ref 0–37)
Albumin: 4.7 g/dL (ref 3.5–5.2)
Alkaline Phosphatase: 61 U/L (ref 39–117)
BUN: 17 mg/dL (ref 6–23)
CO2: 27 mEq/L (ref 19–32)
Calcium: 9.4 mg/dL (ref 8.4–10.5)
Chloride: 98 mEq/L (ref 96–112)
Creatinine, Ser: 0.91 mg/dL (ref 0.40–1.50)
GFR: 79.69 mL/min (ref >60.00)
Glucose, Bld: 117 mg/dL — ABNORMAL HIGH (ref 70–99)
Potassium: 4.3 mEq/L (ref 3.5–5.1)
Sodium: 134 mEq/L — ABNORMAL LOW (ref 135–145)
Total Bilirubin: 2.7 mg/dL — ABNORMAL HIGH (ref 0.2–1.2)
Total Protein: 8 g/dL (ref 6.0–8.3)

## 2019-03-02 LAB — CBC
HCT: 48 % (ref 39.0–52.0)
Hemoglobin: 16.4 g/dL (ref 13.0–17.0)
MCHC: 34.2 g/dL (ref 30.0–36.0)
MCV: 97.3 fl (ref 78.0–100.0)
Platelets: 138 10*3/uL — ABNORMAL LOW (ref 150.0–400.0)
RBC: 4.93 Mil/uL (ref 4.22–5.81)
RDW: 14.8 % (ref 11.5–15.5)
WBC: 6.7 10*3/uL (ref 4.0–10.5)

## 2019-03-02 LAB — LIPID PANEL
Cholesterol: 245 mg/dL — ABNORMAL HIGH (ref 0–200)
HDL: 43.9 mg/dL (ref >39.00)
LDL Cholesterol: 164 mg/dL — ABNORMAL HIGH (ref 0–99)
NonHDL: 200.99
Total CHOL/HDL Ratio: 6
Triglycerides: 183 mg/dL — ABNORMAL HIGH (ref 0.0–149.0)
VLDL: 36.6 mg/dL (ref 0.0–40.0)

## 2019-03-02 LAB — PSA: PSA: 2.02 ng/mL (ref 0.10–4.00)

## 2019-03-02 LAB — HEMOGLOBIN A1C: Hgb A1c MFr Bld: 5.2 % (ref 4.6–6.5)

## 2019-03-02 LAB — TSH: TSH: 1.54 u[IU]/mL (ref 0.35–4.50)

## 2019-03-02 MED ORDER — LOSARTAN POTASSIUM-HCTZ 100-12.5 MG PO TABS: 1.0000 | ORAL_TABLET | Freq: Every day | ORAL | 3 refills | Status: DC

## 2019-03-02 MED ORDER — PROPRANOLOL HCL 40 MG PO TABS: 40.0000 mg | ORAL_TABLET | Freq: Two times a day (BID) | ORAL | 3 refills | Status: DC

## 2019-03-02 NOTE — Patient Instructions (Addendum)
Great to see you today- I will be in touch with your labs asap Your urine dip looks ok but I will check a culture as well We will do some labs to look for any cause of your night sweats Continue current BP meds    Health Maintenance After Age 82 After age 3, you are at a higher risk for certain long-term diseases and infections as well as injuries from falls. Falls are a major cause of broken bones and head injuries in people who are older than age 36. Getting regular preventive care can help to keep you healthy and well. Preventive care includes getting regular testing and making lifestyle changes as recommended by your health care provider. Talk with your health care provider about:  Which screenings and tests you should have. A screening is a test that checks for a disease when you have no symptoms.  A diet and exercise plan that is right for you. What should I know about screenings and tests to prevent falls? Screening and testing are the best ways to find a health problem early. Early diagnosis and treatment give you the best chance of managing medical conditions that are common after age 86. Certain conditions and lifestyle choices may make you more likely to have a fall. Your health care provider may recommend:  Regular vision checks. Poor vision and conditions such as cataracts can make you more likely to have a fall. If you wear glasses, make sure to get your prescription updated if your vision changes.  Medicine review. Work with your health care provider to regularly review all of the medicines you are taking, including over-the-counter medicines. Ask your health care provider about any side effects that may make you more likely to have a fall. Tell your health care provider if any medicines that you take make you feel dizzy or sleepy.  Osteoporosis screening. Osteoporosis is a condition that causes the bones to get weaker. This can make the bones weak and cause them to break more  easily.  Blood pressure screening. Blood pressure changes and medicines to control blood pressure can make you feel dizzy.  Strength and balance checks. Your health care provider may recommend certain tests to check your strength and balance while standing, walking, or changing positions.  Foot health exam. Foot pain and numbness, as well as not wearing proper footwear, can make you more likely to have a fall.  Depression screening. You may be more likely to have a fall if you have a fear of falling, feel emotionally low, or feel unable to do activities that you used to do.  Alcohol use screening. Using too much alcohol can affect your balance and may make you more likely to have a fall. What actions can I take to lower my risk of falls? General instructions  Talk with your health care provider about your risks for falling. Tell your health care provider if: ? You fall. Be sure to tell your health care provider about all falls, even ones that seem minor. ? You feel dizzy, sleepy, or off-balance.  Take over-the-counter and prescription medicines only as told by your health care provider. These include any supplements.  Eat a healthy diet and maintain a healthy weight. A healthy diet includes low-fat dairy products, low-fat (lean) meats, and fiber from whole grains, beans, and lots of fruits and vegetables. Home safety  Remove any tripping hazards, such as rugs, cords, and clutter.  Install safety equipment such as grab bars in bathrooms and  safety rails on stairs.  Keep rooms and walkways well-lit. Activity   Follow a regular exercise program to stay fit. This will help you maintain your balance. Ask your health care provider what types of exercise are appropriate for you.  If you need a cane or walker, use it as recommended by your health care provider.  Wear supportive shoes that have nonskid soles. Lifestyle  Do not drink alcohol if your health care provider tells you not to  drink.  If you drink alcohol, limit how much you have: ? 0-1 drink a day for women. ? 0-2 drinks a day for men.  Be aware of how much alcohol is in your drink. In the [place] one drink equals one typical bottle of beer (12 oz), one-half glass of wine (5 oz), or one shot of hard liquor (1 oz).  Do not use any products that contain nicotine or tobacco, such as cigarettes and e-cigarettes. If you need help quitting, ask your health care provider. Summary  Having a healthy lifestyle and getting preventive care can help to protect your health and wellness after age 74.  Screening and testing are the best way to find a health problem early and help you avoid having a fall. Early diagnosis and treatment give you the best chance for managing medical conditions that are more common for people who are older than age 65.  Falls are a major cause of broken bones and head injuries in people who are older than age 12. Take precautions to prevent a fall at home.  Work with your health care provider to learn what changes you can make to improve your health and wellness and to prevent falls. This information is not intended to replace advice given to you by your health care provider. Make sure you discuss any questions you have with your health care provider. Document Released: 06/17/2017 Document Revised: 11/25/2018 Document Reviewed: 06/17/2017 Elsevier Patient Education  2020 [employer].

## 2019-03-03 ENCOUNTER — Encounter: Payer: Self-pay | Admitting: Family Medicine

## 2019-03-04 LAB — QUANTIFERON-TB GOLD PLUS
Mitogen-NIL: >10 IU/mL
NIL: 0.05 IU/mL
QuantiFERON-TB Gold Plus: NEGATIVE
TB1-NIL: 0.02 IU/mL
TB2-NIL: 0.02 IU/mL

## 2019-03-04 LAB — URINE CULTURE
MICRO NUMBER:: 669592
Result:: NO GROWTH
SPECIMEN QUALITY:: ADEQUATE

## 2019-03-04 LAB — HIV ANTIBODY (ROUTINE TESTING W REFLEX): HIV 1&2 Ab, 4th Generation: NONREACTIVE

## 2019-03-04 MED ORDER — TAMSULOSIN HCL 0.4 MG PO CAPS: 0.4000 mg | ORAL_CAPSULE | Freq: Every day | ORAL | 11 refills | Status: DC

## 2019-03-04 MED ORDER — LOVASTATIN 10 MG PO TABS: 10.0000 mg | ORAL_TABLET | Freq: Every day | ORAL | 6 refills | Status: DC

## 2019-03-04 NOTE — Addendum Note (Signed)
Addended by: Lamar Blinks C on: 03/04/2019 04:02 PM   Modules accepted: Orders

## 2019-03-11 ENCOUNTER — Encounter: Payer: Self-pay | Admitting: Family Medicine

## 2019-03-29 ENCOUNTER — Ambulatory Visit (INDEPENDENT_AMBULATORY_CARE_PROVIDER_SITE_OTHER): Payer: Medicare Other | Admitting: General Practice

## 2019-03-29 ENCOUNTER — Other Ambulatory Visit: Payer: Self-pay

## 2019-03-29 DIAGNOSIS — Z7901 Long term (current) use of anticoagulants: Secondary | ICD-10-CM

## 2019-03-29 DIAGNOSIS — Z86718 Personal history of other venous thrombosis and embolism: Secondary | ICD-10-CM

## 2019-03-29 LAB — POCT INR: INR: 2.2 (ref 2.0–3.0)

## 2019-03-29 NOTE — Patient Instructions (Addendum)
Pre visit review using our clinic review tool, if applicable. No additional management support is needed unless otherwise documented below in the visit note.  Continue to take 1/2 tablet all days except 1 tablet on Tuesday and Saturdays.  Re-check in 6 weeks.

## 2019-04-19 ENCOUNTER — Other Ambulatory Visit: Payer: Self-pay | Admitting: Family Medicine

## 2019-04-19 DIAGNOSIS — M62838 Other muscle spasm: Secondary | ICD-10-CM

## 2019-05-10 ENCOUNTER — Other Ambulatory Visit: Payer: Self-pay

## 2019-05-10 ENCOUNTER — Ambulatory Visit (INDEPENDENT_AMBULATORY_CARE_PROVIDER_SITE_OTHER): Payer: Medicare Other | Admitting: General Practice

## 2019-05-10 DIAGNOSIS — Z7901 Long term (current) use of anticoagulants: Secondary | ICD-10-CM | POA: Diagnosis not present

## 2019-05-10 DIAGNOSIS — Z23 Encounter for immunization: Secondary | ICD-10-CM | POA: Diagnosis not present

## 2019-05-10 DIAGNOSIS — Z86718 Personal history of other venous thrombosis and embolism: Secondary | ICD-10-CM

## 2019-05-10 LAB — POCT INR: INR: 2.7 (ref 2.0–3.0)

## 2019-05-10 NOTE — Progress Notes (Signed)
Medical screening examination/treatment/procedure(s) were performed by non-physician practitioner and as supervising physician I was immediately available for consultation/collaboration. I agree with above. Jihad Kelvin Sennett, MD   

## 2019-05-10 NOTE — Patient Instructions (Addendum)
Pre visit review using our clinic review tool, if applicable. No additional management support is needed unless otherwise documented below in the visit note.  Continue to take 1/2 tablet all days except 1 tablet on Tuesday and Saturdays.  Re-check in 6 weeks.

## 2019-05-19 ENCOUNTER — Other Ambulatory Visit: Payer: Self-pay | Admitting: Family Medicine

## 2019-05-21 ENCOUNTER — Encounter: Payer: Self-pay | Admitting: Family Medicine

## 2019-05-22 NOTE — Addendum Note (Signed)
Addended by: Lamar Blinks C on: 05/22/2019 11:48 AM   Modules accepted: Orders

## 2019-06-14 ENCOUNTER — Encounter: Payer: Self-pay | Admitting: Family Medicine

## 2019-06-14 DIAGNOSIS — I10 Essential (primary) hypertension: Secondary | ICD-10-CM

## 2019-06-14 MED ORDER — LOSARTAN POTASSIUM-HCTZ 100-12.5 MG PO TABS: 1.0000 | ORAL_TABLET | Freq: Every day | ORAL | 3 refills | Status: DC

## 2019-06-21 ENCOUNTER — Ambulatory Visit: Payer: Medicare Other

## 2019-06-24 ENCOUNTER — Encounter: Payer: Self-pay | Admitting: Family Medicine

## 2019-06-28 ENCOUNTER — Ambulatory Visit (INDEPENDENT_AMBULATORY_CARE_PROVIDER_SITE_OTHER): Payer: Medicare Other | Admitting: General Practice

## 2019-06-28 ENCOUNTER — Other Ambulatory Visit: Payer: Self-pay

## 2019-06-28 DIAGNOSIS — Z7901 Long term (current) use of anticoagulants: Secondary | ICD-10-CM | POA: Diagnosis not present

## 2019-06-28 DIAGNOSIS — Z86718 Personal history of other venous thrombosis and embolism: Secondary | ICD-10-CM

## 2019-06-28 LAB — POCT INR: INR: 2.3 (ref 2.0–3.0)

## 2019-06-28 NOTE — Patient Instructions (Addendum)
Pre visit review using our clinic review tool, if applicable. No additional management support is needed unless otherwise documented below in the visit note.  Continue to take 1/2 tablet all days except 1 tablet on Tuesday and Saturdays.  Re-check in 6 weeks.

## 2019-06-28 NOTE — Progress Notes (Signed)
Medical screening examination/treatment/procedure(s) were performed by non-physician practitioner and as supervising physician I was immediately available for consultation/collaboration. I agree with above. Armarion Nyle Limb, MD   

## 2019-07-06 ENCOUNTER — Other Ambulatory Visit: Payer: Self-pay

## 2019-07-06 DIAGNOSIS — Z20822 Contact with and (suspected) exposure to covid-19: Secondary | ICD-10-CM

## 2019-07-08 LAB — NOVEL CORONAVIRUS, NAA: SARS-CoV-2, NAA: NOT DETECTED

## 2019-08-09 ENCOUNTER — Ambulatory Visit (INDEPENDENT_AMBULATORY_CARE_PROVIDER_SITE_OTHER): Payer: Medicare Other | Admitting: General Practice

## 2019-08-09 ENCOUNTER — Other Ambulatory Visit: Payer: Self-pay

## 2019-08-09 DIAGNOSIS — Z7901 Long term (current) use of anticoagulants: Secondary | ICD-10-CM | POA: Diagnosis not present

## 2019-08-09 DIAGNOSIS — Z86718 Personal history of other venous thrombosis and embolism: Secondary | ICD-10-CM

## 2019-08-09 LAB — POCT INR: INR: 2.2 (ref 2.0–3.0)

## 2019-08-09 NOTE — Progress Notes (Signed)
Medical screening examination/treatment/procedure(s) were performed by non-physician practitioner and as supervising physician I was immediately available for consultation/collaboration. I agree with above. Teshawn Laniece Hornbaker, MD   

## 2019-08-09 NOTE — Patient Instructions (Signed)
.  [id number]  Continue to take 1/2 tablet all days except 1 tablet on Tuesday and Saturdays.  Re-check in 6 weeks.

## 2019-08-18 ENCOUNTER — Encounter: Payer: Self-pay | Admitting: Family Medicine

## 2019-08-24 ENCOUNTER — Other Ambulatory Visit: Payer: Self-pay | Admitting: Family Medicine

## 2019-08-27 ENCOUNTER — Ambulatory Visit: Payer: Medicare Other | Attending: Internal Medicine

## 2019-08-27 DIAGNOSIS — Z23 Encounter for immunization: Secondary | ICD-10-CM | POA: Insufficient documentation

## 2019-08-27 NOTE — Progress Notes (Signed)
   Covid-19 Vaccination Clinic  Name:  Patrick Hatfield    MRN: [id number] DOB: October 11, 1934  08/27/2019  Mr. Patrick Hatfield was observed post Covid-19 immunization for 30 minutes based on pre-vaccination screening without incidence. He was provided with Vaccine Information Sheet and instruction to access the V-Safe system.   Mr. Patrick Hatfield was instructed to call 911 with any severe reactions post vaccine: Marland Kitchen Difficulty breathing  . Swelling of your face and throat  . A fast heartbeat  . A bad rash all over your body  . Dizziness and weakness    Immunizations Administered    Name Date Dose VIS Date Route   Pfizer COVID-19 Vaccine 08/27/2019  1:05 PM 0.3 mL 07/29/2019 Intramuscular   Manufacturer: [employer], [employer]   Lot: [id number]   [place]: [id number]

## 2019-09-17 ENCOUNTER — Ambulatory Visit: Payer: Medicare Other

## 2019-09-17 ENCOUNTER — Ambulatory Visit: Payer: Medicare Other | Attending: Internal Medicine

## 2019-09-17 DIAGNOSIS — Z23 Encounter for immunization: Secondary | ICD-10-CM | POA: Insufficient documentation

## 2019-09-17 NOTE — Progress Notes (Signed)
   Covid-19 Vaccination Clinic  Name:  Patrick Hatfield    MRN: [id number] DOB: Nov 06, 1934  09/17/2019  Mr. Schatzman was observed post Covid-19 immunization for 15 minutes without incidence. He was provided with Vaccine Information Sheet and instruction to access the V-Safe system.   Mr. Oftedahl was instructed to call 911 with any severe reactions post vaccine: Marland Kitchen Difficulty breathing  . Swelling of your face and throat  . A fast heartbeat  . A bad rash all over your body  . Dizziness and weakness    Immunizations Administered    Name Date Dose VIS Date Route   Pfizer COVID-19 Vaccine 09/17/2019  1:12 PM 0.3 mL 07/29/2019 Intramuscular   Manufacturer: [place]   Lot: [id number]   [place]: [id number]

## 2019-09-18 ENCOUNTER — Other Ambulatory Visit: Payer: Self-pay | Admitting: Family Medicine

## 2019-09-18 DIAGNOSIS — M62838 Other muscle spasm: Secondary | ICD-10-CM

## 2019-09-19 ENCOUNTER — Encounter: Payer: Self-pay | Admitting: Family Medicine

## 2019-09-19 NOTE — Telephone Encounter (Signed)
Requesting:Valium Contract:none [id number] Last Visit:03/02/2019 Next Visit:none scheduled with pcp Last Refill:04/20/2019  Please Advise

## 2019-09-20 ENCOUNTER — Ambulatory Visit (INDEPENDENT_AMBULATORY_CARE_PROVIDER_SITE_OTHER): Payer: Medicare Other | Admitting: General Practice

## 2019-09-20 ENCOUNTER — Other Ambulatory Visit: Payer: Self-pay

## 2019-09-20 DIAGNOSIS — Z86718 Personal history of other venous thrombosis and embolism: Secondary | ICD-10-CM

## 2019-09-20 DIAGNOSIS — Z7901 Long term (current) use of anticoagulants: Secondary | ICD-10-CM

## 2019-09-20 LAB — POCT INR: INR: 2.3 (ref 2.0–3.0)

## 2019-09-20 NOTE — Patient Instructions (Signed)
Pre visit review using our clinic review tool, if applicable. No additional management support is needed unless otherwise documented below in the visit note.  Continue to take 1/2 tablet all days except 1 tablet on Tuesday and Saturdays.  Re-check in 6 weeks.

## 2019-09-20 NOTE — Progress Notes (Signed)
Medical screening examination/treatment/procedure(s) were performed by non-physician practitioner and as supervising physician I was immediately available for consultation/collaboration. I agree with above. Carlus Jaiceon Collister, MD   

## 2019-09-22 NOTE — Progress Notes (Signed)
[facility] [address] [place], [place] [address] [phone] [id number] [phone]  Date:  09/26/2019   Name:  Patrick Hatfield   DOB:  [birth year]   MRN:  [id number]  PCP:  Darreld Mclean, MD    Chief Complaint: No chief complaint on file.   History of Present Illness:  Patrick Hatfield is a 85 y.o. very pleasant male patient who presents with the following:  Virtual visit today for 18-month follow-up Patient location is home, provider location is office.  Patient identity is confirmed with 2 factors, he gives consent for virtual visit today.  The patient and myself are present on the call today Patient with history of factor V Leiden mutation on Coumadin, hypertension, GERD/Barrett's esophagus, Rosanna Randy syndrome, hyperlipidemia Last seen by myself in July 2020 for physical  Married to Halsey, who was recently treated for leukemia-[name] reports that she is doing quite well They have 4 daughters and 6 grandchildren He is managed by Coumadin clinic, saw them on February 2, INR therapeutic  Diazepam as needed- he notes that this helps him get back to sleep when he tends to wake up between 2 and 3 am Gabapentin- he does not use daily, does not need refill Losartan/HCTZ Lovastatin- taking at bedtime, no SE noted  Fish oil Propranolol 40 twice daily Flomax Coumadin  He has had both of his covid vaccines now as of a week ago   He would like to defer doing labs until this summer He uses his protonix 3-4x a week  Patient Active Problem List   Diagnosis Date Noted  . Hearing loss 01/26/2018  . Long term (current) use of anticoagulants 05/15/2017  . Encounter for therapeutic drug monitoring 12/05/2016  . Insomnia 09/05/2015  . Frequent loose stools 09/05/2015  . Hx of adenomatous colonic polyps 12/14/2014  . Hematuria, gross 02/03/2014  . Thrombosis of mesenteric vein (HCC) 07/12/2013  . Thrombocytopenia, unspecified ([place]) 01/05/2013   . Gilbert syndrome 01/05/2013  . Unspecified adverse effect of unspecified drug, medicinal and biological substance 01/09/2012  . Barrett's esophagus 03/24/2011  . Factor 5 Leiden mutation, heterozygous ([place]) 03/21/2011  . Essential tremor 04/24/2009  . HYPERLIPIDEMIA 10/03/2008  . Essential hypertension 10/03/2008  . GERD 10/03/2008  . Fasting hyperglycemia 10/03/2008  . DVT, HX OF 10/03/2008    Past Medical History:  Diagnosis Date  . Anxiety   . Arthritis    KNEES,ANKLES,HANDS  . Asthma    AS A TEENAGER  . Barrett's esophagus   . Benign essential tremor   . Clotting disorder ([place])    FACTOR 5  . Colitis in 20's  . Colon polyps   . Congenital deficiency of other clotting factors    factor 5  . Depression 2006   drug overdose  . Diverticulosis   . DVT (deep venous thrombosis) ([place]) 1991 & 2010   X 2 in context Factor 5 def  . Esophageal reflux   . Factor 5 Leiden mutation, heterozygous ([place])   . Gilbert's syndrome   . H/O ischemic bowel disease 2010   while off warfarin   . Herpes zoster 2010   facial  . Hiatal hernia   . Hx of adenomatous colonic polyps 12/14/2014  . Hyperlipidemia   . Hypertension   . Insomnia   . Osteoporosis   . Personal history of colonic polyps 05/28/2007   adenomatous  . Proteinuria age 36  . Thrombosis of mesenteric vein (HCC) 07/12/2013  03/2009  . Umbilical hernia     Past Surgical History:  Procedure Laterality Date  . CHOLECYSTECTOMY  05/16/2011    Dr Marlou Starks  . COLONOSCOPY     Dr Sharlett Iles  . CYSTOSCOPY     Neg  . EYE SURGERY Bilateral 10/16/2017   secondary catarct surgery.  Marland Kitchen KNEE ARTHROSCOPY Bilateral 2008, 2012   Dr.Aplington bilateral  . TONSILLECTOMY    . TRANSURETHRAL RESECTION OF PROSTATE      BPH;Dr Dahlstedt    Social History   Tobacco Use  . Smoking status: Former Smoker    Packs/day: 2.00    Years: 40.00    Pack years: 80.00    Types: Cigarettes    Quit date: 08/18/1994    Years since quitting: 25.1   . Smokeless tobacco: Never Used  . Tobacco comment: smoked 1956-1996, up to 2 ppd  Substance Use Topics  . Alcohol use: Not Currently  . Drug use: No    Family History  Problem Relation Age of Onset  . Pancreatic cancer Father 33  . Ulcers Father   . Stroke Father 60  . Diabetes Father   . Coronary artery disease Mother   . Osteoporosis Mother   . HIV Brother   . Multiple sclerosis Sister   . Heart attack Paternal Grandfather        early 28s  . Heart attack Paternal Uncle        2 uncles in early 74s  . Aortic aneurysm Brother   . Colon cancer Neg Hx   . Esophageal cancer Neg Hx   . Stomach cancer Neg Hx     Allergies  Allergen Reactions  . Amlodipine Besy-Benazepril Hcl     REACTION: swelling of feet.  Probably  from Amlodipine component)  . Lipitor [Atorvastatin Calcium]     ? Reaction, ? Swelling=- update 7/20.  Pt recalls his legs hurt and perhaps swelled slightly - JC    Medication list has been reviewed and updated.  Current Outpatient Medications on File Prior to Visit  Medication Sig Dispense Refill  . acetaminophen (TYLENOL) 325 MG tablet Per bottle as needed    . cholecalciferol (VITAMIN D) 1000 UNITS tablet Take 1,000 Units by mouth daily.      . diazepam (VALIUM) 5 MG tablet TAKE 1/2 TO 1 TABLET BY MOUTH AT BEDTIME AS NEEDED FOR SLEEP 30 tablet 0  . Fluticasone Propionate (FLONASE NA) Place into the nose as needed.    . folic acid (FOLVITE) A999333 MCG tablet Take 400 mcg by mouth daily.      Marland Kitchen gabapentin (NEURONTIN) 300 MG capsule TAKE 1 OR 2 CAPSULES BY MOUTH AT BEDTIME AS NEEDED 180 capsule 1  . losartan-hydrochlorothiazide (HYZAAR) 100-12.5 MG tablet Take 1 tablet by mouth daily. 90 tablet 3  . lovastatin (MEVACOR) 10 MG tablet TAKE 1 TABLET BY MOUTH EVERYDAY AT BEDTIME 90 tablet 2  . Omega-3 Fatty Acids (FISH OIL) 1200 MG CAPS Take 1,200 mg by mouth 2 (two) times daily.      . pantoprazole (PROTONIX) 40 MG tablet Take 1 tablet (40 mg total) by mouth  daily. 90 tablet 3  . propranolol (INDERAL) 40 MG tablet Take 1 tablet (40 mg total) by mouth 2 (two) times daily. 180 tablet 3  . tamsulosin (FLOMAX) 0.4 MG CAPS capsule Take 1 capsule (0.4 mg total) by mouth daily. For BPH 30 capsule 11  . warfarin (COUMADIN) 5 MG tablet TAKE AS DIRECTED BY ANTICOAGULATION CLINIC 120 tablet 3  No current facility-administered medications on file prior to visit.    Review of Systems:  As per HPI- otherwise negative.   Physical Examination: There were no vitals filed for this visit. There were no vitals filed for this visit. There is no height or weight on file to calculate BMI. Ideal Body Weight:    [name] checked his vitals at home today- BP 135/90  P 75 His DBP is generally in the 70s per his report  Patient observed over video monitor.  He looks well, his normal self.  No cough, wheezing, shortness of breath or distress   Assessment and Plan: Essential hypertension  Dyslipidemia - Plan: lovastatin (MEVACOR) 10 MG tablet  Factor 5 Leiden mutation, heterozygous ([place])  Fasting hyperglycemia  Virtual visit today for follow-up Patient with history of hypertension which is under good control He is taking lovastatin for dyslipidemia and tolerating well Coumadin for hypercoagulable state, recent check in therapeutic range Overall [name] is doing well, we scheduled him for an in person visit and labs in June Medical decision making today low  Signed Lamar Blinks, MD

## 2019-09-26 ENCOUNTER — Other Ambulatory Visit: Payer: Self-pay

## 2019-09-26 ENCOUNTER — Ambulatory Visit (INDEPENDENT_AMBULATORY_CARE_PROVIDER_SITE_OTHER): Payer: Medicare Other | Admitting: Family Medicine

## 2019-09-26 ENCOUNTER — Encounter: Payer: Self-pay | Admitting: Family Medicine

## 2019-09-26 DIAGNOSIS — I10 Essential (primary) hypertension: Secondary | ICD-10-CM | POA: Diagnosis not present

## 2019-09-26 DIAGNOSIS — E785 Hyperlipidemia, unspecified: Secondary | ICD-10-CM | POA: Diagnosis not present

## 2019-09-26 DIAGNOSIS — D6851 Activated protein C resistance: Secondary | ICD-10-CM

## 2019-09-26 DIAGNOSIS — R7301 Impaired fasting glucose: Secondary | ICD-10-CM | POA: Diagnosis not present

## 2019-09-26 MED ORDER — LOVASTATIN 10 MG PO TABS: ORAL_TABLET | ORAL | 3 refills | Status: DC

## 2019-11-01 ENCOUNTER — Ambulatory Visit (INDEPENDENT_AMBULATORY_CARE_PROVIDER_SITE_OTHER): Payer: Medicare Other | Admitting: General Practice

## 2019-11-01 ENCOUNTER — Other Ambulatory Visit: Payer: Self-pay

## 2019-11-01 DIAGNOSIS — Z7901 Long term (current) use of anticoagulants: Secondary | ICD-10-CM | POA: Diagnosis not present

## 2019-11-01 LAB — POCT INR: INR: 2.1 (ref 2.0–3.0)

## 2019-11-01 NOTE — Progress Notes (Signed)
Medical screening examination/treatment/procedure(s) were performed by non-physician practitioner and as supervising physician I was immediately available for consultation/collaboration. I agree with above. Reason Yoniel Arkwright, MD   

## 2019-11-01 NOTE — Patient Instructions (Addendum)
Pre visit review using our clinic review tool, if applicable. No additional management support is needed unless otherwise documented below in the visit note.  Continue to take 1/2 tablet all days except 1 tablet on Tuesday and Saturdays.  Re-check in 6 weeks.

## 2019-11-12 ENCOUNTER — Encounter: Payer: Self-pay | Admitting: Family Medicine

## 2019-11-12 DIAGNOSIS — J40 Bronchitis, not specified as acute or chronic: Secondary | ICD-10-CM

## 2019-11-12 DIAGNOSIS — J189 Pneumonia, unspecified organism: Secondary | ICD-10-CM

## 2019-11-12 MED ORDER — CEFDINIR 300 MG PO CAPS: 300.0000 mg | ORAL_CAPSULE | Freq: Two times a day (BID) | ORAL | 0 refills | Status: DC

## 2019-11-12 NOTE — Addendum Note (Signed)
Addended by: Lamar Blinks C on: 11/12/2019 02:50 PM   Modules accepted: Orders

## 2019-11-16 ENCOUNTER — Other Ambulatory Visit: Payer: Self-pay | Admitting: Family Medicine

## 2019-11-16 DIAGNOSIS — M62838 Other muscle spasm: Secondary | ICD-10-CM

## 2019-11-16 NOTE — Telephone Encounter (Signed)
Requesting: valium Contract:N/A UDS:N/A Last Visit:09/26/19 Next Visit:01/23/20 Last Refill: 09/19/19  Please Advise

## 2019-11-17 ENCOUNTER — Other Ambulatory Visit: Payer: Self-pay

## 2019-11-17 ENCOUNTER — Encounter: Payer: Self-pay | Admitting: Family Medicine

## 2019-11-17 ENCOUNTER — Ambulatory Visit (INDEPENDENT_AMBULATORY_CARE_PROVIDER_SITE_OTHER): Payer: Medicare Other | Admitting: General Practice

## 2019-11-17 DIAGNOSIS — Z86718 Personal history of other venous thrombosis and embolism: Secondary | ICD-10-CM

## 2019-11-17 DIAGNOSIS — Z7901 Long term (current) use of anticoagulants: Secondary | ICD-10-CM

## 2019-11-17 LAB — POCT INR: INR: 2.1 (ref 2.0–3.0)

## 2019-11-17 NOTE — Progress Notes (Signed)
Medical screening examination/treatment/procedure(s) were performed by non-physician practitioner and as supervising physician I was immediately available for consultation/collaboration. I agree with above. Astor Menaal Russum, MD   

## 2019-11-17 NOTE — Patient Instructions (Signed)
Pre visit review using our clinic review tool, if applicable. No additional management support is needed unless otherwise documented below in the visit note.  Continue to take 1/2 tablet all days except 1 tablet on Tuesday and Saturdays.  Re-check in 6 weeks.

## 2019-11-21 ENCOUNTER — Encounter: Payer: Self-pay | Admitting: Family Medicine

## 2019-11-21 NOTE — Telephone Encounter (Signed)
Called pt back -he is drinking fluids but not eating No vomiting but he his having urgent diarrhea  He is not having any belly pain Temp is now normal His wife is feeling ok His main issue is diarrhea No blood in his stool No suspicious foods   Recommended that he be seen at the ER given advance age.  He declines for now- he will continue to manage at home, but I encouraged him to have a low threshold to seek ER help if not improving.  Encouraged ginger ale, gatorade, light diet such as saltines and bananas as tolerated He will keep me posted  Can try imodium

## 2019-11-22 ENCOUNTER — Emergency Department (HOSPITAL_BASED_OUTPATIENT_CLINIC_OR_DEPARTMENT_OTHER)
Admission: EM | Admit: 2019-11-22 | Discharge: 2019-11-22 | Disposition: A | Discharge status: Home / Self Care | Payer: Medicare Other | Attending: Emergency Medicine | Admitting: Emergency Medicine

## 2019-11-22 ENCOUNTER — Encounter (HOSPITAL_BASED_OUTPATIENT_CLINIC_OR_DEPARTMENT_OTHER): Payer: Self-pay | Admitting: Emergency Medicine

## 2019-11-22 ENCOUNTER — Emergency Department (HOSPITAL_BASED_OUTPATIENT_CLINIC_OR_DEPARTMENT_OTHER): Payer: Medicare Other

## 2019-11-22 ENCOUNTER — Other Ambulatory Visit: Payer: Self-pay

## 2019-11-22 ENCOUNTER — Encounter: Payer: Self-pay | Admitting: Family Medicine

## 2019-11-22 DIAGNOSIS — Z888 Allergy status to other drugs, medicaments and biological substances status: Secondary | ICD-10-CM | POA: Diagnosis not present

## 2019-11-22 DIAGNOSIS — Z86718 Personal history of other venous thrombosis and embolism: Secondary | ICD-10-CM | POA: Insufficient documentation

## 2019-11-22 DIAGNOSIS — E785 Hyperlipidemia, unspecified: Secondary | ICD-10-CM | POA: Diagnosis not present

## 2019-11-22 DIAGNOSIS — K5792 Diverticulitis of intestine, part unspecified, without perforation or abscess without bleeding: Secondary | ICD-10-CM | POA: Diagnosis not present

## 2019-11-22 DIAGNOSIS — I10 Essential (primary) hypertension: Secondary | ICD-10-CM | POA: Diagnosis not present

## 2019-11-22 DIAGNOSIS — Z79899 Other long term (current) drug therapy: Secondary | ICD-10-CM | POA: Diagnosis not present

## 2019-11-22 DIAGNOSIS — R197 Diarrhea, unspecified: Secondary | ICD-10-CM | POA: Diagnosis present

## 2019-11-22 DIAGNOSIS — K5732 Diverticulitis of large intestine without perforation or abscess without bleeding: Secondary | ICD-10-CM | POA: Diagnosis not present

## 2019-11-22 DIAGNOSIS — K5730 Diverticulosis of large intestine without perforation or abscess without bleeding: Secondary | ICD-10-CM | POA: Diagnosis not present

## 2019-11-22 LAB — CBC WITH DIFFERENTIAL/PLATELET
Abs Immature Granulocytes: 0.05 10*3/uL (ref 0.00–0.07)
Basophils Absolute: 0.1 10*3/uL (ref 0.0–0.1)
Basophils Relative: 1 %
Eosinophils Absolute: 0.1 10*3/uL (ref 0.0–0.5)
Eosinophils Relative: 1 %
HCT: 47.5 % (ref 39.0–52.0)
Hemoglobin: 16.3 g/dL (ref 13.0–17.0)
Immature Granulocytes: 1 %
Lymphocytes Relative: 21 %
Lymphs Abs: 2.1 10*3/uL (ref 0.7–4.0)
MCH: 33.2 pg (ref 26.0–34.0)
MCHC: 34.3 g/dL (ref 30.0–36.0)
MCV: 96.7 fL (ref 80.0–100.0)
Monocytes Absolute: 0.9 10*3/uL (ref 0.1–1.0)
Monocytes Relative: 9 %
Neutro Abs: 6.9 10*3/uL (ref 1.7–7.7)
Neutrophils Relative %: 67 %
Platelets: 114 10*3/uL — ABNORMAL LOW (ref 150–400)
RBC: 4.91 MIL/uL (ref 4.22–5.81)
RDW: 13.3 % (ref 11.5–15.5)
Smear Review: DECREASED
WBC: 10.1 10*3/uL (ref 4.0–10.5)
nRBC: 0 % (ref 0.0–0.2)

## 2019-11-22 LAB — URINALYSIS, ROUTINE W REFLEX MICROSCOPIC
Bilirubin Urine: NEGATIVE
Glucose, UA: NEGATIVE mg/dL
Ketones, ur: NEGATIVE mg/dL
Leukocytes,Ua: NEGATIVE
Nitrite: NEGATIVE
Protein, ur: 30 mg/dL — AB
Specific Gravity, Urine: 1.02 (ref 1.005–1.030)
pH: 6 (ref 5.0–8.0)

## 2019-11-22 LAB — COMPREHENSIVE METABOLIC PANEL
ALT: 30 U/L (ref 0–44)
AST: 23 U/L (ref 15–41)
Albumin: 3.7 g/dL (ref 3.5–5.0)
Alkaline Phosphatase: 53 U/L (ref 38–126)
Anion gap: 11 (ref 5–15)
BUN: 13 mg/dL (ref 8–23)
CO2: 23 mmol/L (ref 22–32)
Calcium: 9 mg/dL (ref 8.9–10.3)
Chloride: 100 mmol/L (ref 98–111)
Creatinine, Ser: 0.91 mg/dL (ref 0.61–1.24)
GFR calc Af Amer: >60 mL/min (ref >60)
GFR calc non Af Amer: >60 mL/min (ref >60)
Glucose, Bld: 131 mg/dL — ABNORMAL HIGH (ref 70–99)
Potassium: 3.2 mmol/L — ABNORMAL LOW (ref 3.5–5.1)
Sodium: 134 mmol/L — ABNORMAL LOW (ref 135–145)
Total Bilirubin: 2.7 mg/dL — ABNORMAL HIGH (ref 0.3–1.2)
Total Protein: 8 g/dL (ref 6.5–8.1)

## 2019-11-22 LAB — URINALYSIS, MICROSCOPIC (REFLEX)

## 2019-11-22 LAB — LIPASE, BLOOD: Lipase: 18 U/L (ref 11–51)

## 2019-11-22 MED ORDER — IOHEXOL 300 MG/ML  SOLN
100.0000 mL | Freq: Once | INTRAMUSCULAR | Status: AC | PRN
  Administered 2019-11-22: 100 mL via INTRAVENOUS

## 2019-11-22 MED ORDER — AMOXICILLIN-POT CLAVULANATE 875-125 MG PO TABS: 1.0000 | ORAL_TABLET | Freq: Two times a day (BID) | ORAL | 0 refills | Status: DC

## 2019-11-22 MED ORDER — SODIUM CHLORIDE 0.9 % IV BOLUS
1000.0000 mL | Freq: Once | INTRAVENOUS | Status: AC
  Administered 2019-11-22: 1000 mL via INTRAVENOUS

## 2019-11-22 MED FILL — AMOX-CLAV 875-125 MG TABLET: 875-125 | 7 days supply | Qty: 14 | Fill #0

## 2019-11-22 NOTE — Discharge Instructions (Addendum)
Please take antibiotic as prescribed.  Please schedule follow-up appointment with primary doctor for recheck later this week.  Return to ER if you develop fever, vomiting, worsening abdominal pain or other new concerning symptom.

## 2019-11-22 NOTE — ED Provider Notes (Signed)
[facility] EMERGENCY DEPARTMENT Provider Note   CSN: [id number] Arrival date & time: 11/22/19  0857     History Chief Complaint  Patient presents with  . Diarrhea    Patrick Hatfield is a 85 y.o. male.  Presented to ER with complaints of diarrhea.  Patient reports he in March was having bad cough, prescribed cefdinir for bronchitis.  Soon after starting the cefdinir he started having diarrhea.  Diarrhea happening a couple times a day, not watery, brown, loose.  Not particularly foul-smelling.  No abdominal pain.  No nausea or vomiting.  Has not identified any alleviating or aggravating factors for his diarrhea.  Called his primary doctor who recommended going to ER for further evaluation.  Has had some chills but no fevers. No blood in stools. HPI     Past Medical History:  Diagnosis Date  . Anxiety   . Arthritis    KNEES,ANKLES,HANDS  . Asthma    AS A TEENAGER  . Barrett's esophagus   . Benign essential tremor   . Clotting disorder ([place])    FACTOR 5  . Colitis in 20's  . Colon polyps   . Congenital deficiency of other clotting factors    factor 5  . Depression 2006   drug overdose  . Diverticulosis   . DVT (deep venous thrombosis) ([place]) 1991 & 2010   X 2 in context Factor 5 def  . Esophageal reflux   . Factor 5 Leiden mutation, heterozygous ([place])   . Gilbert's syndrome   . H/O ischemic bowel disease 2010   while off warfarin   . Herpes zoster 2010   facial  . Hiatal hernia   . Hx of adenomatous colonic polyps 12/14/2014  . Hyperlipidemia   . Hypertension   . Insomnia   . Osteoporosis   . Personal history of colonic polyps 05/28/2007   adenomatous  . Proteinuria age 41  . Thrombosis of mesenteric vein ([place]) 07/12/2013   03/2009  . Umbilical hernia     Patient Active Problem List   Diagnosis Date Noted  . Hearing loss 01/26/2018  . Long term (current) use of anticoagulants 05/15/2017  . Encounter for therapeutic drug monitoring 12/05/2016  .  Insomnia 09/05/2015  . Frequent loose stools 09/05/2015  . Hx of adenomatous colonic polyps 12/14/2014  . Hematuria, gross 02/03/2014  . Thrombosis of mesenteric vein (HCC) 07/12/2013  . Thrombocytopenia, unspecified ([place]) 01/05/2013  . Gilbert syndrome 01/05/2013  . Unspecified adverse effect of unspecified drug, medicinal and biological substance 01/09/2012  . Barrett's esophagus 03/24/2011  . Factor 5 Leiden mutation, heterozygous ([place]) 03/21/2011  . Essential tremor 04/24/2009  . HYPERLIPIDEMIA 10/03/2008  . Essential hypertension 10/03/2008  . GERD 10/03/2008  . Fasting hyperglycemia 10/03/2008  . DVT, HX OF 10/03/2008    Past Surgical History:  Procedure Laterality Date  . CHOLECYSTECTOMY  05/16/2011    Dr Marlou Starks  . COLONOSCOPY     Dr Sharlett Iles  . CYSTOSCOPY     Neg  . EYE SURGERY Bilateral 10/16/2017   secondary catarct surgery.  Marland Kitchen KNEE ARTHROSCOPY Bilateral 2008, 2012   Dr.Aplington bilateral  . TONSILLECTOMY    . TRANSURETHRAL RESECTION OF PROSTATE      BPH;Dr Dahlstedt       Family History  Problem Relation Age of Onset  . Pancreatic cancer Father 49  . Ulcers Father   . Stroke Father 21  . Diabetes Father   . Coronary artery disease Mother   . Osteoporosis Mother   .  HIV Brother   . Multiple sclerosis Sister   . Heart attack Paternal Grandfather        early 37s  . Heart attack Paternal Uncle        2 uncles in early 22s  . Aortic aneurysm Brother   . Colon cancer Neg Hx   . Esophageal cancer Neg Hx   . Stomach cancer Neg Hx     Social History   Tobacco Use  . Smoking status: Former Smoker    Packs/day: 2.00    Years: 40.00    Pack years: 80.00    Types: Cigarettes    Quit date: 08/18/1994    Years since quitting: 25.2  . Smokeless tobacco: Never Used  . Tobacco comment: smoked 1956-1996, up to 2 ppd  Substance Use Topics  . Alcohol use: Not Currently  . Drug use: No    Home Medications Prior to Admission medications   Medication Sig  Start Date End Date Taking? Authorizing Provider  acetaminophen (TYLENOL) 325 MG tablet Per bottle as needed    [provider]  amoxicillin-clavulanate (AUGMENTIN) 875-125 MG tablet Take 1 tablet by mouth every 12 (twelve) hours. 11/22/19   Lucrezia Starch, MD  cefdinir (OMNICEF) 300 MG capsule Take 1 capsule (300 mg total) by mouth 2 (two) times daily. 11/12/19   Copland, Gay Filler, MD  cholecalciferol (VITAMIN D) 1000 UNITS tablet Take 1,000 Units by mouth daily.      [provider]  diazepam (VALIUM) 5 MG tablet TAKE 1/2 TO 1 TABLET BY MOUTH AT BEDTIME AS NEEDED FOR SLEEP 11/16/19   Copland, Gay Filler, MD  Fluticasone Propionate (FLONASE NA) Place into the nose as needed.    [provider]  folic acid (FOLVITE) A999333 MCG tablet Take 400 mcg by mouth daily.      [provider]  gabapentin (NEURONTIN) 300 MG capsule TAKE 1 OR 2 CAPSULES BY MOUTH AT BEDTIME AS NEEDED 05/19/19   Copland, Gay Filler, MD  losartan-hydrochlorothiazide (HYZAAR) 100-12.5 MG tablet Take 1 tablet by mouth daily. 06/14/19   Copland, Gay Filler, MD  lovastatin (MEVACOR) 10 MG tablet TAKE 1 TABLET BY MOUTH EVERYDAY AT BEDTIME 09/26/19   Copland, Gay Filler, MD  Omega-3 Fatty Acids (FISH OIL) 1200 MG CAPS Take 1,200 mg by mouth 2 (two) times daily.      [provider]  pantoprazole (PROTONIX) 40 MG tablet Take 1 tablet (40 mg total) by mouth daily. 09/06/18   Copland, Gay Filler, MD  propranolol (INDERAL) 40 MG tablet Take 1 tablet (40 mg total) by mouth 2 (two) times daily. 03/02/19   Copland, Gay Filler, MD  tamsulosin (FLOMAX) 0.4 MG CAPS capsule Take 1 capsule (0.4 mg total) by mouth daily. For BPH 03/04/19   Copland, Gay Filler, MD  warfarin (COUMADIN) 5 MG tablet TAKE AS DIRECTED BY ANTICOAGULATION CLINIC 09/06/18   Copland, Gay Filler, MD    Allergies    Amlodipine besy-benazepril hcl and Lipitor [atorvastatin calcium]  Review of Systems   Review of Systems  Constitutional: Positive  for chills. Negative for fever.  HENT: Negative for ear pain and sore throat.   Eyes: Negative for pain and visual disturbance.  Respiratory: Negative for cough and shortness of breath.   Cardiovascular: Negative for chest pain and palpitations.  Gastrointestinal: Positive for diarrhea. Negative for abdominal pain and vomiting.  Genitourinary: Negative for dysuria and hematuria.  Musculoskeletal: Negative for arthralgias and back pain.  Skin: Negative for color change and  rash.  Neurological: Negative for seizures and syncope.  All other systems reviewed and are negative.   Physical Exam Updated Vital Signs BP 140/73   Pulse 66   Temp 98.5 F (36.9 C) (Oral)   Resp 18   Ht 5\' 10"  (1.778 m)   Wt 99 kg   SpO2 100%   BMI 31.31 kg/m   Physical Exam Vitals and nursing note reviewed.  Constitutional:      Appearance: He is well-developed.  HENT:     Head: Normocephalic and atraumatic.  Eyes:     Conjunctiva/sclera: Conjunctivae normal.  Cardiovascular:     Rate and Rhythm: Normal rate and regular rhythm.     Heart sounds: No murmur.  Pulmonary:     Effort: Pulmonary effort is normal. No respiratory distress.     Breath sounds: Normal breath sounds.  Abdominal:     Palpations: Abdomen is soft.     Comments: No rebound or guarding, there is focal tenderness in left lower quadrant  Musculoskeletal:     Cervical back: Neck supple.  Skin:    General: Skin is warm and dry.  Neurological:     Mental Status: He is alert.     ED Results / Procedures / Treatments   Labs (all labs ordered are listed, but only abnormal results are displayed) Labs Reviewed  CBC WITH DIFFERENTIAL/PLATELET - Abnormal; Notable for the following components:      Result Value   Platelets 114 (*)    All other components within normal limits  COMPREHENSIVE METABOLIC PANEL - Abnormal; Notable for the following components:   Sodium 134 (*)    Potassium 3.2 (*)    Glucose, Bld 131 (*)    Total  Bilirubin 2.7 (*)    All other components within normal limits  URINALYSIS, ROUTINE W REFLEX MICROSCOPIC - Abnormal; Notable for the following components:   Hgb urine dipstick TRACE (*)    Protein, ur 30 (*)    All other components within normal limits  URINALYSIS, MICROSCOPIC (REFLEX) - Abnormal; Notable for the following components:   Bacteria, UA FEW (*)    All other components within normal limits  LIPASE, BLOOD    EKG None  Radiology CT ABDOMEN PELVIS W CONTRAST  Result Date: 11/22/2019 CLINICAL DATA:  The patient woke up at 3 a.m. 2 nights ago with chills and subsequently developed diarrhea. EXAM: CT ABDOMEN AND PELVIS WITH CONTRAST TECHNIQUE: Multidetector CT imaging of the abdomen and pelvis was performed using the standard protocol following bolus administration of intravenous contrast. CONTRAST:  100 mL OMNIPAQUE IOHEXOL 300 MG/ML  SOLN COMPARISON:  CT abdomen and pelvis 11/19/2016. FINDINGS: Lower chest: The lung bases are clear. No pleural or pericardial effusion. Hepatobiliary: Small cysts in the right hepatic lobe adjacent to the falciform ligament and in the dome of the liver are unchanged. There is fatty infiltration of the liver. The patient is status post cholecystectomy. Biliary tree is unremarkable. Pancreas: Unremarkable. No pancreatic ductal dilatation or surrounding inflammatory changes. Spleen: Normal in size without focal abnormality. Adrenals/Urinary Tract: Adrenal glands are unremarkable. Kidneys are normal, without renal calculi, focal lesion, or hydronephrosis. Bladder is unremarkable. Stomach/Bowel: The patient has diverticulosis which is fairly extensive in the descending and sigmoid colon. There is mild stranding about diverticula in the proximal descending colon and mid sigmoid colon. No abscess or perforation. The stomach, small bowel and appendix appear normal. Vascular/Lymphatic: Aortic atherosclerosis. No enlarged abdominal or pelvic lymph nodes. Reproductive:  Prostatomegaly is unchanged. Other:  None. Musculoskeletal: No acute or focal abnormality. Multilevel lumbar spondylosis appears worst at L4-5 and L5-S1 as on the prior exam. IMPRESSION: Diverticulosis with mild stranding about diverticula in the proximal descending and mid sigmoid colon compatible with acute diverticulitis. Negative for abscess or perforation. Fatty infiltration of the liver. Atherosclerosis. Prostatomegaly. Lumbar degenerative disease. Electronically Signed   By: Inge Rise M.D.   On: 11/22/2019 10:34    Procedures Procedures (including critical care time)  Medications Ordered in ED Medications  sodium chloride 0.9 % bolus 1,000 mL (0 mLs Intravenous Stopped 11/22/19 1101)  iohexol (OMNIPAQUE) 300 MG/ML solution 100 mL (100 mLs Intravenous Contrast Given 11/22/19 1012)    ED Course  I have reviewed the triage vital signs and the nursing notes.  Pertinent labs & imaging results that were available during my care of the patient were reviewed by me and considered in my medical decision making (see chart for details).    MDM Rules/Calculators/A&P                      85 year old male presenting to ER with diarrhea.  On exam, noted to be very well-appearing, denied abdominal pain but did note focal tenderness in his left lower quadrant.  Labs grossly normal, CT scan notable for likely diverticulitis.  Given diarrhea, focal tenderness on exam, suspect this is likely etiology of symptoms today.  Will treat with course of Augmentin and recommend recheck with primary doctor.  Tolerating p.o., vital stable, appropriate for discharge and outpatient management this time.    After the discussed management above, the patient was determined to be safe for discharge.  The patient was in agreement with this plan and all questions regarding their care were answered.  ED return precautions were discussed and the patient will return to the ED with any significant worsening of  condition.  Final Clinical Impression(s) / ED Diagnoses Final diagnoses:  Diverticulitis    Rx / DC Orders ED Discharge Orders         Ordered    amoxicillin-clavulanate (AUGMENTIN) 875-125 MG tablet  Every 12 hours     11/22/19 1050           Lucrezia Starch, MD 11/22/19 [phone]

## 2019-11-22 NOTE — ED Triage Notes (Addendum)
2 days ago pt woke up at 3am with chills that lasted for a few hours.  Then he got diarrhea and it lasted all day and part of yesterday.  Still has just a little this morning but nothing like it was.  Called his pmd, Dr. Edilia Bo, and she suggested he come to the ED for "testing." Pt denies vomiting or abdominal pain.

## 2019-11-22 NOTE — ED Notes (Signed)
Patient transported to CT 

## 2019-11-28 ENCOUNTER — Other Ambulatory Visit: Payer: Self-pay | Admitting: Family Medicine

## 2019-12-05 ENCOUNTER — Telehealth: Payer: Self-pay | Admitting: Internal Medicine

## 2019-12-05 ENCOUNTER — Other Ambulatory Visit (INDEPENDENT_AMBULATORY_CARE_PROVIDER_SITE_OTHER): Payer: Medicare Other

## 2019-12-05 ENCOUNTER — Ambulatory Visit: Payer: Medicare Other | Admitting: Nurse Practitioner

## 2019-12-05 ENCOUNTER — Encounter: Payer: Self-pay | Admitting: Nurse Practitioner

## 2019-12-05 VITALS — BP 126/68 | HR 68 | Ht 70.0 in | Wt 214.0 lb

## 2019-12-05 DIAGNOSIS — R197 Diarrhea, unspecified: Secondary | ICD-10-CM

## 2019-12-05 LAB — CBC WITH DIFFERENTIAL/PLATELET
Basophils Absolute: 0.1 10*3/uL (ref 0.0–0.1)
Basophils Relative: 0.9 % (ref 0.0–3.0)
Eosinophils Absolute: 0.1 10*3/uL (ref 0.0–0.7)
Eosinophils Relative: 0.6 % (ref 0.0–5.0)
HCT: 44.9 % (ref 39.0–52.0)
Hemoglobin: 15 g/dL (ref 13.0–17.0)
Lymphocytes Relative: 26.3 % (ref 12.0–46.0)
Lymphs Abs: 2.9 10*3/uL (ref 0.7–4.0)
MCHC: 33.5 g/dL (ref 30.0–36.0)
MCV: 96.7 fl (ref 78.0–100.0)
Monocytes Absolute: 1 10*3/uL (ref 0.1–1.0)
Monocytes Relative: 8.9 % (ref 3.0–12.0)
Neutro Abs: 6.9 10*3/uL (ref 1.4–7.7)
Neutrophils Relative %: 63.3 % (ref 43.0–77.0)
Platelets: 197 10*3/uL (ref 150.0–400.0)
RBC: 4.64 Mil/uL (ref 4.22–5.81)
RDW: 13.5 % (ref 11.5–15.5)
WBC: 10.9 10*3/uL — ABNORMAL HIGH (ref 4.0–10.5)

## 2019-12-05 LAB — COMPREHENSIVE METABOLIC PANEL
ALT: 17 U/L (ref 0–53)
AST: 20 U/L (ref 0–37)
Albumin: 3.7 g/dL (ref 3.5–5.2)
Alkaline Phosphatase: 64 U/L (ref 39–117)
BUN: 6 mg/dL (ref 6–23)
CO2: 29 mEq/L (ref 19–32)
Calcium: 8.8 mg/dL (ref 8.4–10.5)
Chloride: 100 mEq/L (ref 96–112)
Creatinine, Ser: 0.73 mg/dL (ref 0.40–1.50)
GFR: 102.57 mL/min (ref >60.00)
Glucose, Bld: 95 mg/dL (ref 70–99)
Potassium: 3.8 mEq/L (ref 3.5–5.1)
Sodium: 136 mEq/L (ref 135–145)
Total Bilirubin: 1.5 mg/dL — ABNORMAL HIGH (ref 0.2–1.2)
Total Protein: 7.3 g/dL (ref 6.0–8.3)

## 2019-12-05 LAB — C-REACTIVE PROTEIN: CRP: 5.1 mg/dL (ref 0.5–20.0)

## 2019-12-05 MED ORDER — VANCOMYCIN HCL 125 MG PO CAPS: 125.0000 mg | ORAL_CAPSULE | Freq: Four times a day (QID) | ORAL | 0 refills | Status: DC

## 2019-12-05 NOTE — Progress Notes (Signed)
12/05/2019 Patrick Hatfield [id number] 26-Jul-1935   Chief Complaint:  Diarrhea   History of Present Illness: Patrick Hatfield is an 85 year old male with a past medical history of anxiety, arthritis, hypertension, Leiden factor V deficiency, DVT, mesenteric vein thrombosis, Gilbert's syndrome, GERD, remote history of ? colitis  and colon polyps. He presents today with complaints of frequent urgent orange watery diarrhea which started on 11/20/2019. He reported taking Cefdinir antibiotic for bronchitis which was started on 11/15/2019. He was passing soft brown stools which fall apart in the toilet water prior to taking Cefdinir. He developed watery orange diarrhea on 4/4 which worsened. He presented to [place] emergency room on 11/22/19 with worsening diarrhea.  No abdominal pain.  He underwent an abdominal/pelvic CT which showed diverticulosis with mild stranding about diverticula proximal descending mid sigmoid colon which was interpreted as acute diverticulitis.  WBC 10.1.  Hemoglobin 16.3.  Hematocrit 47.5.  Platelet 114.  BUN 13.  Creatinine 0.91.  Lipase 18.  AST 23.  ALT 30.  Total bilirubin is 2.7.  He was prescribed Augmentin 875 mg 1 p.o. twice daily for 10 days by the ED physician.  His diarrhea slowed down for about 1 day then worsened.  He continues to have 10-12 orange watery episodes of diarrhea daily and is waking up in the middle the night 2-3 times with diarrhea as well.  Still no bloody diarrhea.  No abdominal pain.  No fever, sweats or chills.  His urine output has decreased his urine color is a light yellow.  He is eating a bland diet.  No prior history of [name] infection.  He has a history of colon polyps.   CBC Latest Ref Rng & Units 11/22/2019 03/02/2019 02/22/2018  WBC 4.0 - 10.5 K/uL 10.1 6.7 6.6  Hemoglobin 13.0 - 17.0 g/dL 16.3 16.4 16.3  Hematocrit 39.0 - 52.0 % 47.5 48.0 46.6  Platelets 150 - 400 K/uL 114(L) 138.0(L) 118.0(L)   CMP Latest Ref Rng & Units  11/22/2019 03/02/2019 02/22/2018  Glucose 70 - 99 mg/dL 131(H) 117(H) 101(H)  BUN 8 - 23 mg/dL 13 17 14   Creatinine 0.61 - 1.24 mg/dL 0.91 0.91 0.83  Sodium 135 - 145 mmol/L 134(L) 134(L) 135  Potassium 3.5 - 5.1 mmol/L 3.2(L) 4.3 3.8  Chloride 98 - 111 mmol/L 100 98 98  CO2 22 - 32 mmol/L 23 27 28   Calcium 8.9 - 10.3 mg/dL 9.0 9.4 9.7  Total Protein 6.5 - 8.1 g/dL 8.0 8.0 7.8  Total Bilirubin 0.3 - 1.2 mg/dL 2.7(H) 2.7(H) 2.3(H)  Alkaline Phos 38 - 126 U/L 53 61 48  AST 15 - 41 U/L 23 38(H) 48(H)  ALT 0 - 44 U/L 30 53 65(H)   Abdominal/Pelvic CT with contrast 11/20/2019:  Diverticulosis with mild stranding about diverticula in the proximal descending and mid sigmoid colon compatible with acute diverticulitis. Negative for abscess or perforation. Fatty infiltration of the liver. Atherosclerosis. Prostatomegaly. Lumbar degenerative disease  EGD 12/18/2014 by Dr. Carlean Hatfield: 1) Ring in distal esophagus 2) 4-5 cm sliding hiatal hernia 3) Otherwise normal - no Barrett's seen 4) Successful 18 mm balloon dilation of ring w/ slight heme as expected  Colonoscopy 12/18/2014:  1. Five sessile tubular adenomatous polyps ranging from 2 to 83mm in size were found in the transverse colon, ascending colon, rectum, and descending colon; polypectomies were performed with cold forceps and with a cold snare 2. There was severe diverticulosis noted in the sigmoid  colon 3. The examination was otherwise normal 4. The colon was redundant No further colonoscopies recommended due to age  Past Medical History:  Diagnosis Date  . Anxiety   . Arthritis    KNEES,ANKLES,HANDS  . Asthma    AS A TEENAGER  . Barrett's esophagus   . Benign essential tremor   . Clotting disorder ([place])    FACTOR 5  . Colitis in 20's  . Colon polyps   . Congenital deficiency of other clotting factors    factor 5  . Depression 2006   drug overdose  . Diverticulosis   . DVT (deep venous thrombosis) ([place]) 1991 & 2010   X 2  in context Factor 5 def  . Esophageal reflux   . Factor 5 Leiden mutation, heterozygous ([place])   . Gilbert's syndrome   . H/O ischemic bowel disease 2010   while off warfarin   . Herpes zoster 2010   facial  . Hiatal hernia   . Hx of adenomatous colonic polyps 12/14/2014  . Hyperlipidemia   . Hypertension   . Insomnia   . Osteoporosis   . Personal history of colonic polyps 05/28/2007   adenomatous  . Proteinuria age 16  . Thrombosis of mesenteric vein ([place]) 07/12/2013   03/2009  . Umbilical hernia    Family History  Problem Relation Age of Onset  . Pancreatic cancer Father 44  . Ulcers Father   . Stroke Father 44  . Diabetes Father   . Coronary artery disease Mother   . Osteoporosis Mother   . HIV Brother   . Multiple sclerosis Sister   . Heart attack Paternal Grandfather        early 69s  . Heart attack Paternal Uncle        2 uncles in early 52s  . Aortic aneurysm Brother   . Colon cancer Neg Hx   . Esophageal cancer Neg Hx   . Stomach cancer Neg Hx      Social History   Socioeconomic History  . Marital status: Married    Spouse name: Not on file  . Number of children: 4  . Years of education: Not on file  . Highest education level: Not on file  Occupational History  . Occupation: Retired    Fish farm manager: RETIRED  Tobacco Use  . Smoking status: Former Smoker    Packs/day: 2.00    Years: 40.00    Pack years: 80.00    Types: Cigarettes    Quit date: 08/18/1994    Years since quitting: 25.3  . Smokeless tobacco: Never Used  . Tobacco comment: smoked 1956-1996, up to 2 ppd  Substance and Sexual Activity  . Alcohol use: Not Currently  . Drug use: No  . Sexual activity: Not Currently  Other Topics Concern  . Not on file  Social History Narrative   Married, lives with spouse   Married x56 years   4 daughters   4 grandchildren   Retired - Armed forces operational officer, sales/technology   Social Determinants of Radio broadcast assistant Strain:   . Difficulty of  Paying Living Expenses:   Food Insecurity:   . Worried About Charity fundraiser in the Last Year:   . Arboriculturist in the Last Year:   Transportation Needs:   . Film/video editor (Medical):   Marland Kitchen Lack of Transportation (Non-Medical):   Physical Activity:   . Days of Exercise per Week:   . Minutes of Exercise per Session:  Stress:   . Feeling of Stress :   Social Connections:   . Frequency of Communication with Friends and Family:   . Frequency of Social Gatherings with Friends and Family:   . Attends Religious Services:   . Active Member of Clubs or Organizations:   . Attends Archivist Meetings:   Marland Kitchen Marital Status:   Intimate Partner Violence:   . Fear of Current or Ex-Partner:   . Emotionally Abused:   Marland Kitchen Physically Abused:   . Sexually Abused:      Current Outpatient Medications on File Prior to Visit  Medication Sig Dispense Refill  . acetaminophen (TYLENOL) 325 MG tablet Per bottle as needed    . cholecalciferol (VITAMIN D) 1000 UNITS tablet Take 1,000 Units by mouth daily.      . diazepam (VALIUM) 5 MG tablet TAKE 1/2 TO 1 TABLET BY MOUTH AT BEDTIME AS NEEDED FOR SLEEP 30 tablet 1  . Fluticasone Propionate (FLONASE NA) Place into the nose as needed.    . folic acid (FOLVITE) A999333 MCG tablet Take 400 mcg by mouth daily.      Marland Kitchen gabapentin (NEURONTIN) 300 MG capsule TAKE 1 OR 2 CAPSULES BY MOUTH AT BEDTIME AS NEEDED 180 capsule 1  . losartan-hydrochlorothiazide (HYZAAR) 100-12.5 MG tablet Take 1 tablet by mouth daily. 90 tablet 3  . lovastatin (MEVACOR) 10 MG tablet TAKE 1 TABLET BY MOUTH EVERYDAY AT BEDTIME 90 tablet 3  . Omega-3 Fatty Acids (FISH OIL) 1200 MG CAPS Take 1,200 mg by mouth 2 (two) times daily.      . pantoprazole (PROTONIX) 40 MG tablet TAKE 1 TABLET BY MOUTH EVERY DAY 90 tablet 3  . propranolol (INDERAL) 40 MG tablet Take 1 tablet (40 mg total) by mouth 2 (two) times daily. 180 tablet 3  . warfarin (COUMADIN) 5 MG tablet TAKE AS DIRECTED BY  ANTICOAGULATION CLINIC 120 tablet 3  . tamsulosin (FLOMAX) 0.4 MG CAPS capsule Take 1 capsule (0.4 mg total) by mouth daily. For BPH 30 capsule 11   No current facility-administered medications on file prior to visit.   Allergies  Allergen Reactions  . Amlodipine Besy-Benazepril Hcl     REACTION: swelling of feet.  Probably  from Amlodipine component)  . Lipitor [Atorvastatin Calcium]     ? Reaction, ? Swelling=- update 7/20.  Pt recalls his legs hurt and perhaps swelled slightly - JC      Physical Exam: BP 126/68   Pulse 68   Ht 5\' 10"  (1.778 m)   Wt 214 lb (97.1 kg)   BMI 30.71 kg/m  General: 85 year old male in no acute distress. Head: Normocephalic and atraumatic. Eyes: No scleral icterus. Conjunctiva pink . Ears: Normal auditory acuity. Mouth: Dentition intact. No ulcers or lesions. Yellow coating on tongue. Buccal mucosa moist.  Lungs: Clear throughout to auscultation. Heart: Regular rate and rhythm, no murmur. Abdomen: Soft, nontender and nondistended. No masses or hepatomegaly. Normal bowel sounds x 4 quadrants.  Rectal: Deferred.  Musculoskeletal: Symmetrical with no gross deformities. Extremities: No edema. Neurological: Alert oriented x 4. No focal deficits.  Psychological: Alert and cooperative. Normal mood and affect  Assessment and Recommendations:  86. 85 year old male with non-bloody diarrhea, suspicious for [name] colitis in setting of 2 courses of antibiotics for bronchitis 3/30 and CTAP evidence of diverticulitis. No abdominal pain. Unlikely diverticulitis, most likely colitis.  -GI Pathogen panel -C. Diff PCR, C. Diff Toxin A and B -Florastor probiotic 1 po bid  x 14 days  -Push fluids, bland diet. -To ER if diarrhea worsens, if urine output decreases -Dr. Carlean Hatfield consulted   2. History of colon polyps -No further colonoscopies recommended due to age  44. History of GERD, stable -Continue Pantoprazole 40mg  daily  4. Leiden factor V deficiency  on Coumadin   5. Gilbert's syndrome  Further follow up to be determined after the above lab results received

## 2019-12-05 NOTE — Patient Instructions (Addendum)
If you are age 83 or older, your body mass index should be between 23-30. Your Body mass index is 30.71 kg/m. If this is out of the aforementioned range listed, please consider follow up with your Primary Care Provider.  If you are age 31 or younger, your body mass index should be between 19-25. Your Body mass index is 30.71 kg/m. If this is out of the aformentioned range listed, please consider follow up with your Primary Care Provider.   Your provider has requested that you go to the basement level for lab work before leaving today. Press "B" on the elevator. The lab is located at the first door on the left as you exit the elevator.  1. Bland diet 2. Push fluids 3. Go to the emergency room if your symptoms worsen. 4. Vancomycin 125 mg 1 tablet four times daily for 14 days 5. Use Florastor probiotic 1 tablet by mouth 2 times daily.   Due to recent changes in healthcare laws, you may see the results of your imaging and laboratory studies on MyChart before your provider has had a chance to review them.  We understand that in some cases there may be results that are confusing or concerning to you. Not all laboratory results come back in the same time frame and the provider may be waiting for multiple results in order to interpret others.  Please give [place] 48 hours in order for your provider to thoroughly review all the results before contacting the office for clarification of your results.   Thank you for choosing [facility] Noralyn Pick, CRNP

## 2019-12-05 NOTE — Telephone Encounter (Signed)
Patient has been scheduled for today at 2:00 He is aware of the appt date and time

## 2019-12-06 ENCOUNTER — Other Ambulatory Visit: Payer: Medicare Other

## 2019-12-06 DIAGNOSIS — R197 Diarrhea, unspecified: Secondary | ICD-10-CM

## 2019-12-08 ENCOUNTER — Telehealth: Payer: Self-pay | Admitting: Nurse Practitioner

## 2019-12-08 LAB — GI PROFILE, STOOL, PCR

## 2019-12-08 LAB — CLOSTRIDIUM DIFFICILE TOXIN B, QUALITATIVE, REAL-TIME PCR: Toxigenic C. Difficile by PCR: DETECTED — AB

## 2019-12-08 LAB — CLOSTRIDIUM DIFFICILE EIA: C difficile Toxins A+B, EIA: POSITIVE — AB

## 2019-12-08 NOTE — Telephone Encounter (Signed)
Left a voicemail that the patient is scheduled on 12/28/2019 @9 :30

## 2019-12-08 NOTE — Telephone Encounter (Signed)
Patient is calling- states he spoke with Jaclyn Shaggy this morning and she wants him to follow up in 2 weeks. I let him know her next day was not until 5/12 and he states she wants him in in 2 weeks and patient does perfer it to be [facility] since thats who he has been seeing. He states he will be out of town the week of the 12th. Asked to speak to [facility] Nurse/MA.

## 2019-12-08 NOTE — Telephone Encounter (Signed)
HI Vaughan Basta, if his diarrhea has completely resolved he can schedule a follow up appt in 3 weeks. If he is still having diarrhea then schedule him with available APP in 2 weeks (I am working in the hospital the first week of May). He needs to call the office if his diarrhea worsens at any time. THX.

## 2019-12-13 ENCOUNTER — Ambulatory Visit: Payer: Medicare Other

## 2019-12-15 ENCOUNTER — Ambulatory Visit: Payer: Medicare Other | Admitting: *Deleted

## 2019-12-26 ENCOUNTER — Other Ambulatory Visit: Payer: Self-pay

## 2019-12-26 MED ORDER — VANCOMYCIN HCL 125 MG PO CAPS: 125.0000 mg | ORAL_CAPSULE | Freq: Four times a day (QID) | ORAL | 0 refills | Status: AC

## 2019-12-27 NOTE — Progress Notes (Signed)
12/27/2019 Patrick Hatfield [id number] 1934/10/28   Chief Complaint: C. Diff diarrhea   History of Present Illness: Patrick Hatfield is an 85 year old male with a past medical history of anxiety, arthritis, hypertension, Leiden factor V deficiency, DVT, mesenteric vein thrombosis, Gilbert's syndrome, GERD, remote history of ? colitis  and colon polyps.  I saw him in the office on 12/05/2019 with severe diarrhea which developed after taking Cefdinir for bronchitis.  He was started on Vancomycin 125 mg p.o. 4 times daily for 2 weeks and Florastor probiotic 1 p.o. twice daily.  [name] toxin A & B were positive 4/20.  His diarrhea significantly improved after starting the Vancomycin. His diarrhea abated by the 7th day of taking Vancomycin, he completed the full 14 day course.  However, once he finished his last dose of Vancomycin within 6 days his diarrhea recurred with "vengeance". He had diarrhea x 3 to 4 episodes then the next day had 6 to 8 episodes of explosive diarrhea. He contacted Dr. Carlean Purl and  he was restarted on Vancomycin 125 mg p.o. 4 times daily on 5/10 with instructions to follow-up in the office today for further Vancomycin tapering instructions.  Diarrhea has significantly improved over the past 2 days.  He is passing semisolid with loose stools 3-4 times yesterday and had 1 similar stool this morning.  No further violent diarrhea.  No abdominal pain.  He researched [name] infection and he read PPIs may attribute to the development of [name] infection therefore he stopped taking his pantoprazole.  He is taking Gaviscon twice weekly as needed.  He denies having any dysphagia, heartburn or upper abdominal pain.  He reports losing approximately 20 pounds since the end of March which may have also contributed to reduced reflux symptoms.  He has a history of a Schatzki's ring in the distal esophagus which was dilated at the time of his EGD in 2016.  No evidence of Barrett's  esophagus at that time. He does not wish to restart the Protonix or any PPI unless absolutely necessary.  He noticed having mild left lower back pain at the hip level which started 2 weeks ago.  He denies having any dysuria.  He is urinating clear light yellow urine without obvious dysuria.     CBC Latest Ref Rng & Units 12/05/2019 11/22/2019 03/02/2019  WBC 4.0 - 10.5 K/uL 10.9(H) 10.1 6.7  Hemoglobin 13.0 - 17.0 g/dL 15.0 16.3 16.4  Hematocrit 39.0 - 52.0 % 44.9 47.5 48.0  Platelets 150.0 - 400.0 K/uL 197.0 114(L) 138.0(L)   CMP Latest Ref Rng & Units 12/05/2019 11/22/2019 03/02/2019  Glucose 70 - 99 mg/dL 95 131(H) 117(H)  BUN 6 - 23 mg/dL 6 13 17   Creatinine 0.40 - 1.50 mg/dL 0.73 0.91 0.91  Sodium 135 - 145 mEq/L 136 134(L) 134(L)  Potassium 3.5 - 5.1 mEq/L 3.8 3.2(L) 4.3  Chloride 96 - 112 mEq/L 100 100 98  CO2 19 - 32 mEq/L 29 23 27   Calcium 8.4 - 10.5 mg/dL 8.8 9.0 9.4  Total Protein 6.0 - 8.3 g/dL 7.3 8.0 8.0  Total Bilirubin 0.2 - 1.2 mg/dL 1.5(H) 2.7(H) 2.7(H)  Alkaline Phos 39 - 117 U/L 64 53 61  AST 0 - 37 U/L 20 23 38(H)  ALT 0 - 53 U/L 17 30 53    Current Outpatient Medications on File Prior to Visit  Medication Sig Dispense Refill  . acetaminophen (TYLENOL) 325 MG tablet Per bottle as needed    .  Al Hyd-Mg Tr-Alg Ac-Sod Bicarb (GAVISCON-2 PO) Take by mouth.    . cholecalciferol (VITAMIN D) 1000 UNITS tablet Take 1,000 Units by mouth daily.      . diazepam (VALIUM) 5 MG tablet TAKE 1/2 TO 1 TABLET BY MOUTH AT BEDTIME AS NEEDED FOR SLEEP 30 tablet 1  . Fluticasone Propionate (FLONASE NA) Place into the nose as needed.    . folic acid (FOLVITE) A999333 MCG tablet Take 400 mcg by mouth daily.      Marland Kitchen gabapentin (NEURONTIN) 300 MG capsule TAKE 1 OR 2 CAPSULES BY MOUTH AT BEDTIME AS NEEDED 180 capsule 1  . losartan-hydrochlorothiazide (HYZAAR) 100-12.5 MG tablet Take 1 tablet by mouth daily. 90 tablet 3  . lovastatin (MEVACOR) 10 MG tablet TAKE 1 TABLET BY MOUTH EVERYDAY AT BEDTIME 90  tablet 3  . Omega-3 Fatty Acids (FISH OIL) 1200 MG CAPS Take 1,200 mg by mouth 2 (two) times daily.      . propranolol (INDERAL) 40 MG tablet Take 1 tablet (40 mg total) by mouth 2 (two) times daily. 180 tablet 3  . vancomycin (VANCOCIN HCL) 125 MG capsule Take 1 capsule (125 mg total) by mouth 4 (four) times daily for 10 days. 40 capsule 0  . warfarin (COUMADIN) 5 MG tablet TAKE AS DIRECTED BY ANTICOAGULATION CLINIC 120 tablet 3   No current facility-administered medications on file prior to visit.    Allergies  Allergen Reactions  . Amlodipine Besy-Benazepril Hcl     REACTION: swelling of feet.  Probably  from Amlodipine component)  . Lipitor [Atorvastatin Calcium]     ? Reaction, ? Swelling=- update 7/20.  Pt recalls his legs hurt and perhaps swelled slightly - JC     Current Medications, Allergies, Past Medical History, Past Surgical History, Family History and Social History were reviewed in [employer] record.   Physical Exam: BP 100/70   Pulse 68   Temp (!) 97.3 F (36.3 C)   Ht 5\' 10"  (1.778 m)   Wt 200 lb (90.7 kg)   BMI 28.70 kg/m   General: Well developed 85 year old male in no acute distress. Lungs: Clear throughout to auscultation. Heart: Regular rate and rhythm, no murmur. Abdomen: Soft, nontender and nondistended. No masses or hepatomegaly. Normal bowel sounds x 4 quadrants. No CVA tenderness on exam.  Rectal: Deferred. Musculoskeletal: Symmetrical with no gross deformities. Extremities: No edema. Neurological: Alert oriented x 4. No focal deficits.  Psychological: Alert and cooperative. Normal mood and affect  Assessment and Recommendations:  44. 85 year old male with C. Diff treated with Vancomycin 125mg  one po qid x 2 weeks and Florastor probiotic. Diarrhea recurred after completing 2 week course of Vanco. Vancomycin 125mg  po quid x 10 days restarted on 5/10.  -Complete the current Vancomycin regimen for 10 days as prescribed by Dr.  Carlean Purl then Vancomycin 125 mg 1 p.o. 3 times daily for 7 days then Vancomycin 125 mg 1 p.o. twice daily for 7 days then Vancomycin 125 mg 1 p.o. daily for 7 days then Vancomycin 125 mg 1 p.o. every third day for 6 weeks # 56, no refills.  -Continue Florastor for the next 6 to 8 weeks as tolerated -Keep well hydrated, diet as tolerated -Patient to call office if diarrhea recurs/worsens -Patient to provide symptoms update in 2 weeks  2. History of colon polyps -No further colonoscopies recommended due to age  42. History of GERD, history of a distal esophageal ring dilated a the time of  his EGD in 2016, no Barrett's esophagus. Patient discontinued taking Pantoprazole 40mg  daily as he was concerned the PPI increased his risk for C. Diff.  -Famotidine 20mg  one po daily -Ok to continue Gaviscon as needed  -Call office if reflux symptoms occur   4. Leiden factor V deficiency on Coumadin   5. Gilbert's syndrome  6. Left lower back/hip pain.  -Advised patient to follow up with his pcp regarding back/hip pain and UA 11/2019 with trace Hg and + protein.

## 2019-12-28 ENCOUNTER — Encounter: Payer: Self-pay | Admitting: Nurse Practitioner

## 2019-12-28 ENCOUNTER — Ambulatory Visit: Payer: Medicare Other | Admitting: Nurse Practitioner

## 2019-12-28 VITALS — BP 100/70 | HR 68 | Temp 97.3°F | Ht 70.0 in | Wt 200.0 lb

## 2019-12-28 DIAGNOSIS — A0472 Enterocolitis due to Clostridium difficile, not specified as recurrent: Secondary | ICD-10-CM | POA: Insufficient documentation

## 2019-12-28 MED ORDER — VANCOMYCIN HCL 125 MG PO CAPS: ORAL_CAPSULE | ORAL | 0 refills | Status: DC

## 2019-12-28 MED ORDER — FAMOTIDINE 20 MG PO TABS: 20.0000 mg | ORAL_TABLET | Freq: Every day | ORAL | 1 refills | Status: DC

## 2019-12-28 NOTE — Patient Instructions (Addendum)
If you are age 83 or older, your body mass index should be between 23-30. Your Body mass index is 28.7 kg/m. If this is out of the aforementioned range listed, please consider follow up with your Primary Care Provider.  If you are age 70 or younger, your body mass index should be between 19-25. Your Body mass index is 28.7 kg/m. If this is out of the aformentioned range listed, please consider follow up with your Primary Care Provider.    We have sent the following medications to your pharmacy for you to pick up at your convenience:  Omeprazole 20mg  daily  Complete the current Vancomycin regimen for 10 days as prescribed by Dr. Carlean Purl then Vancomycin 125 mg 1 p.o. 3 times daily for 7 days then Vancomycin 125 mg 1 p.o. twice daily for 7 days then Vancomycin 125 mg 1 p.o. daily for 7 days then Vancomycin 125 mg 1 p.o. every third day for 6 weeks # 56, no refills.   -Continue Florastor for the next 6 to 8 weeks as tolerated  Due to recent changes in healthcare laws, you may see the results of your imaging and laboratory studies on MyChart before your provider has had a chance to review them.  We understand that in some cases there may be results that are confusing or concerning to you. Not all laboratory results come back in the same time frame and the provider may be waiting for multiple results in order to interpret others.  Please give [place] 48 hours in order for your provider to thoroughly review all the results before contacting the office for clarification of your results.   Thank you for choosing [facility] Noralyn Pick, CRNP

## 2020-01-03 ENCOUNTER — Encounter (HOSPITAL_COMMUNITY): Payer: Self-pay

## 2020-01-03 ENCOUNTER — Other Ambulatory Visit: Payer: Self-pay

## 2020-01-03 ENCOUNTER — Ambulatory Visit (HOSPITAL_COMMUNITY)
Admission: EM | Admit: 2020-01-03 | Discharge: 2020-01-03 | Disposition: A | Discharge status: Home / Self Care | Payer: Medicare Other | Attending: Family Medicine | Admitting: Family Medicine

## 2020-01-03 ENCOUNTER — Encounter: Payer: Self-pay | Admitting: Family Medicine

## 2020-01-03 DIAGNOSIS — M545 Low back pain, unspecified: Secondary | ICD-10-CM

## 2020-01-03 LAB — POCT URINALYSIS DIP (DEVICE)
Bilirubin Urine: NEGATIVE
Glucose, UA: NEGATIVE mg/dL
Hgb urine dipstick: NEGATIVE
Ketones, ur: NEGATIVE mg/dL
Leukocytes,Ua: NEGATIVE
Nitrite: NEGATIVE
Protein, ur: NEGATIVE mg/dL
Specific Gravity, Urine: 1.02 (ref 1.005–1.030)
Urobilinogen, UA: 0.2 mg/dL (ref 0.0–1.0)
pH: 6 (ref 5.0–8.0)

## 2020-01-03 NOTE — ED Triage Notes (Addendum)
Pt is here with on & off right/left flank/ back pain that started 2 weeks ago. Pt states he is currently being treated for C-Diff, pt has not taken anything to relieve discomfort.

## 2020-01-03 NOTE — Discharge Instructions (Addendum)
Your urine was normal Tylenol for pain as needed Heat pad to the back  Follow up as needed for continued or worsening symptoms

## 2020-01-03 NOTE — ED Provider Notes (Signed)
[place]    CSN: [id number] Arrival date & time: 01/03/20  0846      History   Chief Complaint Chief Complaint  Patient presents with  . Flank Pain  . Back Pain    HPI Patrick Hatfield is a 85 y.o. male.   Patient is an 85 year old male with past medical history of anxiety, arthritis, asthma, Barrett's esophagus, factor V, colitis, depression, diverticulosis, DVT, subdural reflux, ischemic bowel disease, shingles, hiatal hernia, hyperlipidemia, hypertension, osteoporosis, [name].  He presents today with lower back discomfort.  Reporting the pain comes mostly in the evening when he lays down to go to bed.  The pain switches from the right to left side intermittently.  This started approximately 2 weeks ago.  Patient is currently on treatment for [name].  Takes Tylenol in the evenings which does help him rest.  Denies any associated fevers, nausea, vomiting, dysuria.  He has had urinary frequency but has been increasing fluids due to the [name] infection.  ROS per HPI      Past Medical History:  Diagnosis Date  . Anxiety   . Arthritis    KNEES,ANKLES,HANDS  . Asthma    AS A TEENAGER  . Barrett's esophagus   . Benign essential tremor   . Clotting disorder ([place])    FACTOR 5  . Colitis in 20's  . Colon polyps   . Congenital deficiency of other clotting factors    factor 5  . Depression 2006   drug overdose  . Diverticulosis   . DVT (deep venous thrombosis) ([place]) 1991 & 2010   X 2 in context Factor 5 def  . Esophageal reflux   . Factor 5 Leiden mutation, heterozygous ([place])   . Gilbert's syndrome   . H/O ischemic bowel disease 2010   while off warfarin   . Herpes zoster 2010   facial  . Hiatal hernia   . Hx of adenomatous colonic polyps 12/14/2014  . Hyperlipidemia   . Hypertension   . Insomnia   . Osteoporosis   . Personal history of colonic polyps 05/28/2007   adenomatous  . Proteinuria age 67  . Thrombosis of mesenteric vein  ([place]) 07/12/2013   03/2009  . Umbilical hernia     Patient Active Problem List   Diagnosis Date Noted  . [name] diarrhea 12/28/2019  . Hearing loss 01/26/2018  . Long term (current) use of anticoagulants 05/15/2017  . Encounter for therapeutic drug monitoring 12/05/2016  . Insomnia 09/05/2015  . Diarrhea 09/05/2015  . Hx of adenomatous colonic polyps 12/14/2014  . Hematuria, gross 02/03/2014  . Thrombosis of mesenteric vein (HCC) 07/12/2013  . Thrombocytopenia, unspecified ([place]) 01/05/2013  . Gilbert syndrome 01/05/2013  . Unspecified adverse effect of unspecified drug, medicinal and biological substance 01/09/2012  . Barrett's esophagus 03/24/2011  . Factor 5 Leiden mutation, heterozygous ([place]) 03/21/2011  . Essential tremor 04/24/2009  . HYPERLIPIDEMIA 10/03/2008  . Essential hypertension 10/03/2008  . GERD 10/03/2008  . Fasting hyperglycemia 10/03/2008  . DVT, HX OF 10/03/2008    Past Surgical History:  Procedure Laterality Date  . CHOLECYSTECTOMY  05/16/2011    Dr Marlou Starks  . COLONOSCOPY     Dr Sharlett Iles  . CYSTOSCOPY     Neg  . EYE SURGERY Bilateral 10/16/2017   secondary catarct surgery.  Marland Kitchen KNEE ARTHROSCOPY Bilateral 2008, 2012   Dr.Aplington bilateral  . TONSILLECTOMY    . TRANSURETHRAL RESECTION OF PROSTATE      BPH;Dr Dahlstedt  Home Medications    Prior to Admission medications   Medication Sig Start Date End Date Taking? Authorizing Provider  acetaminophen (TYLENOL) 325 MG tablet Per bottle as needed    [provider]  Al Hyd-Mg Tr-Alg Ac-Sod Bicarb (GAVISCON-2 PO) Take by mouth.    [provider]  cholecalciferol (VITAMIN D) 1000 UNITS tablet Take 1,000 Units by mouth daily.      [provider]  diazepam (VALIUM) 5 MG tablet TAKE 1/2 TO 1 TABLET BY MOUTH AT BEDTIME AS NEEDED FOR SLEEP 11/16/19   Copland, Gay Filler, MD  famotidine (PEPCID) 20 MG tablet Take 1 tablet (20 mg total) by mouth daily. 12/28/19    Noralyn Pick, NP  Fluticasone Propionate (FLONASE NA) Place into the nose as needed.    [provider]  folic acid (FOLVITE) A999333 MCG tablet Take 400 mcg by mouth daily.      [provider]  gabapentin (NEURONTIN) 300 MG capsule TAKE 1 OR 2 CAPSULES BY MOUTH AT BEDTIME AS NEEDED 05/19/19   Copland, Gay Filler, MD  losartan-hydrochlorothiazide (HYZAAR) 100-12.5 MG tablet Take 1 tablet by mouth daily. 06/14/19   Copland, Gay Filler, MD  lovastatin (MEVACOR) 10 MG tablet TAKE 1 TABLET BY MOUTH EVERYDAY AT BEDTIME 09/26/19   Copland, Gay Filler, MD  Omega-3 Fatty Acids (FISH OIL) 1200 MG CAPS Take 1,200 mg by mouth 2 (two) times daily.      [provider]  propranolol (INDERAL) 40 MG tablet Take 1 tablet (40 mg total) by mouth 2 (two) times daily. 03/02/19   Copland, Gay Filler, MD  vancomycin (VANCOCIN HCL) 125 MG capsule Take 1 capsule (125 mg total) by mouth 4 (four) times daily for 10 days. 12/26/19 01/05/20  Gatha Mayer, MD  vancomycin (VANCOCIN HCL) 125 MG capsule Vancomycin 125 mg 1 p.o. 3 times daily for 7 days then,  Vancomycin 125 mg 1 p.o. twice daily for 7 days then,  Vancomycin 125 mg 1 p.o. daily for 7 days then,  Vancomycin 125 mg 1 p.o. every third day for 6 weeks # 56, no refills. 12/28/19   Noralyn Pick, NP  warfarin (COUMADIN) 5 MG tablet TAKE AS DIRECTED BY ANTICOAGULATION CLINIC 09/06/18   Copland, Gay Filler, MD    Family History Family History  Problem Relation Age of Onset  . Pancreatic cancer Father 29  . Ulcers Father   . Stroke Father 8  . Diabetes Father   . Coronary artery disease Mother   . Osteoporosis Mother   . HIV Brother   . Multiple sclerosis Sister   . Heart attack Paternal Grandfather        early 25s  . Heart attack Paternal Uncle        2 uncles in early 65s  . Aortic aneurysm Brother   . Colon cancer Neg Hx   . Esophageal cancer Neg Hx   . Stomach cancer Neg Hx     Social History Social History    Tobacco Use  . Smoking status: Former Smoker    Packs/day: 2.00    Years: 40.00    Pack years: 80.00    Types: Cigarettes    Quit date: 08/18/1994    Years since quitting: 25.3  . Smokeless tobacco: Never Used  . Tobacco comment: smoked 1956-1996, up to 2 ppd  Substance Use Topics  . Alcohol use: Not Currently  . Drug use: No     Allergies   Amlodipine besy-benazepril hcl and  Lipitor [atorvastatin calcium]   Review of Systems Review of Systems   Physical Exam Triage Vital Signs ED Triage Vitals  Enc Vitals Group     BP 01/03/20 0940 131/74     Pulse Rate 01/03/20 0940 63     Resp 01/03/20 0940 18     Temp 01/03/20 0940 98.3 F (36.8 C)     Temp Source 01/03/20 0940 Oral     SpO2 01/03/20 0940 97 %     Weight 01/03/20 0938 200 lb (90.7 kg)     Height --      Head Circumference --      Peak Flow --      Pain Score 01/03/20 0938 0     Pain Loc --      Pain Edu? --      Excl. in [place]? --    No data found.  Updated Vital Signs BP 131/74 (BP Location: Right Arm)   Pulse 63   Temp 98.3 F (36.8 C) (Oral)   Resp 18   Wt 200 lb (90.7 kg)   SpO2 97%   BMI 28.70 kg/m   Visual Acuity Right Eye Distance:   Left Eye Distance:   Bilateral Distance:    Right Eye Near:   Left Eye Near:    Bilateral Near:     Physical Exam Vitals and nursing note reviewed.  Constitutional:      Appearance: Normal appearance.  HENT:     Head: Normocephalic and atraumatic.     Nose: Nose normal.  Eyes:     Conjunctiva/sclera: Conjunctivae normal.  Pulmonary:     Effort: Pulmonary effort is normal.  Musculoskeletal:        General: Normal range of motion.     Cervical back: Normal range of motion.       Back:     Comments: Area highlighted is where patient reports his pain is when it comes.  Patient currently not having any discomfort and nontender to palpation  Skin:    General: Skin is warm and dry.  Neurological:     Mental Status: He is alert.  Psychiatric:         Mood and Affect: Mood normal.      UC Treatments / Results  Labs (all labs ordered are listed, but only abnormal results are displayed) Labs Reviewed  POCT URINALYSIS DIP (DEVICE)    EKG   Radiology No results found.  Procedures Procedures (including critical care time)  Medications Ordered in UC Medications - No data to display  Initial Impression / Assessment and Plan / UC Course  I have reviewed the triage vital signs and the nursing notes.  Pertinent labs & imaging results that were available during my care of the patient were reviewed by me and considered in my medical decision making (see chart for details).     Bilateral lower back pain without sciatica Urine completely clean and without any trace infection. No concern for pyelonephritis Most likely musculoskeletal pain.  We will have him continue the Tylenol and do heat to the back Follow up as needed for continued or worsening symptoms  Final Clinical Impressions(s) / UC Diagnoses   Final diagnoses:  Acute bilateral low back pain without sciatica     Discharge Instructions     Your urine was normal Tylenol for pain as needed Heat pad to the back  Follow up as needed for continued or worsening symptoms     ED Prescriptions  None     PDMP not reviewed this encounter.   Loura Halt A, NP 01/03/20 1036

## 2020-01-05 ENCOUNTER — Ambulatory Visit: Payer: Medicare Other

## 2020-01-12 ENCOUNTER — Ambulatory Visit: Payer: Medicare Other | Admitting: Nurse Practitioner

## 2020-01-12 ENCOUNTER — Ambulatory Visit (INDEPENDENT_AMBULATORY_CARE_PROVIDER_SITE_OTHER): Payer: Medicare Other | Admitting: General Practice

## 2020-01-12 ENCOUNTER — Other Ambulatory Visit: Payer: Self-pay

## 2020-01-12 DIAGNOSIS — Z86718 Personal history of other venous thrombosis and embolism: Secondary | ICD-10-CM

## 2020-01-12 DIAGNOSIS — Z7901 Long term (current) use of anticoagulants: Secondary | ICD-10-CM | POA: Diagnosis not present

## 2020-01-12 LAB — POCT INR: INR: 2.1 (ref 2.0–3.0)

## 2020-01-12 NOTE — Progress Notes (Signed)
Medical screening examination/treatment/procedure(s) were performed by non-physician practitioner and as supervising physician I was immediately available for consultation/collaboration. I agree with above. Johnaton Jelisa [place], MD   

## 2020-01-12 NOTE — Patient Instructions (Signed)
Pre visit review using our clinic review tool, if applicable. No additional management support is needed unless otherwise documented below in the visit note.  Continue to take 1/2 tablet all days except 1 tablet on Tuesday and Saturdays.  Re-check in 6 weeks.  

## 2020-01-19 ENCOUNTER — Other Ambulatory Visit: Payer: Self-pay | Admitting: Nurse Practitioner

## 2020-01-21 NOTE — Progress Notes (Addendum)
[facility] [address] [place], [place] [address] [phone]  Date:  01/23/2020   Name:  Patrick Hatfield   DOB:  12-Jul-1935   MRN:  [id number]  PCP:  Darreld Mclean, MD    Chief Complaint: Hypertension (needing labs)   History of Present Illness:  Patrick Hatfield is a 85 y.o. very pleasant male patient who presents with the following:  Here today for periodic follow-up visit Last seen by myself in February Patient with history of factor V Leiden mutation on Coumadin, hypertension, GERD/Barrett's esophagus, Rosanna Randy syndrome, hyperlipidemia Married to [name], they have 4 daughters and 6 grandchildren His youngest grand child graduated from HS this week.   Two of his other grandchildren graduated from college this spring He is rightfully very proud of his grandchildren  Since our last visit he had an episode of [name] diarrhea following oral antibiotics and has been treated by gastroenterology with oral vancomycin and probiotics He had to do 2 rounds of vancomycin for recurrent symptoms- he is still taking this now His bowels are back to normal now  He is walking twice a day for exercise, and is trying to eat a healthy diet  CMP, CBC on chart from April Covid vaccine, Shingrix, pneumonia vaccines up-to-date His Coumadin is managed per Coumadin clinic-chronic anticoagulation due to factor V deficiency  Wt Readings from Last 3 Encounters:  01/23/20 201 lb (91.2 kg)  01/03/20 200 lb (90.7 kg)  12/28/19 200 lb (90.7 kg)   BP Readings from Last 3 Encounters:  01/23/20 128/72  01/03/20 131/74  12/28/19 100/70   He is not getting lightheaded.  BP has been lower than in the past   Patient Active Problem List   Diagnosis Date Noted  . [name] diarrhea 12/28/2019  . Hearing loss 01/26/2018  . Long term (current) use of anticoagulants 05/15/2017  . Encounter for therapeutic drug monitoring  12/05/2016  . Insomnia 09/05/2015  . Diarrhea 09/05/2015  . Hx of adenomatous colonic polyps 12/14/2014  . Hematuria, gross 02/03/2014  . Thrombosis of mesenteric vein (HCC) 07/12/2013  . Thrombocytopenia, unspecified ([place]) 01/05/2013  . Gilbert syndrome 01/05/2013  . Unspecified adverse effect of unspecified drug, medicinal and biological substance 01/09/2012  . Barrett's esophagus 03/24/2011  . Factor 5 Leiden mutation, heterozygous ([place]) 03/21/2011  . Essential tremor 04/24/2009  . HYPERLIPIDEMIA 10/03/2008  . Essential hypertension 10/03/2008  . GERD 10/03/2008  . Fasting hyperglycemia 10/03/2008  . DVT, HX OF 10/03/2008    Past Medical History:  Diagnosis Date  . Anxiety   . Arthritis    KNEES,ANKLES,HANDS  . Asthma    AS A TEENAGER  . Barrett's esophagus   . Benign essential tremor   . Clotting disorder ([place])    FACTOR 5  . Colitis in 20's  . Colon polyps   . Congenital deficiency of other clotting factors    factor 5  . Depression 2006   drug overdose  . Diverticulosis   . DVT (deep venous thrombosis) ([place]) 1991 & 2010   X 2 in context Factor 5 def  . Esophageal reflux   . Factor 5 Leiden mutation, heterozygous ([place])   . Gilbert's syndrome   . H/O ischemic bowel disease 2010   while off warfarin   . Herpes zoster 2010   facial  . Hiatal hernia   . Hx of adenomatous colonic polyps 12/14/2014  . Hyperlipidemia   .  Hypertension   . Insomnia   . Osteoporosis   . Personal history of colonic polyps 05/28/2007   adenomatous  . Proteinuria age 53  . Thrombosis of mesenteric vein ([place]) 07/12/2013   03/2009  . Umbilical hernia     Past Surgical History:  Procedure Laterality Date  . CHOLECYSTECTOMY  05/16/2011    Dr Marlou Starks  . COLONOSCOPY     Dr Sharlett Iles  . CYSTOSCOPY     Neg  . EYE SURGERY Bilateral 10/16/2017   secondary catarct surgery.  Marland Kitchen KNEE ARTHROSCOPY Bilateral 2008, 2012   Dr.Aplington bilateral  . TONSILLECTOMY    . TRANSURETHRAL RESECTION OF  PROSTATE      BPH;Dr Dahlstedt    Social History   Tobacco Use  . Smoking status: Former Smoker    Packs/day: 2.00    Years: 40.00    Pack years: 80.00    Types: Cigarettes    Quit date: 08/18/1994    Years since quitting: 25.4  . Smokeless tobacco: Never Used  . Tobacco comment: smoked 1956-1996, up to 2 ppd  Substance Use Topics  . Alcohol use: Not Currently  . Drug use: No    Family History  Problem Relation Age of Onset  . Pancreatic cancer Father 56  . Ulcers Father   . Stroke Father 56  . Diabetes Father   . Coronary artery disease Mother   . Osteoporosis Mother   . HIV Brother   . Multiple sclerosis Sister   . Heart attack Paternal Grandfather        early 76s  . Heart attack Paternal Uncle        2 uncles in early 69s  . Aortic aneurysm Brother   . Colon cancer Neg Hx   . Esophageal cancer Neg Hx   . Stomach cancer Neg Hx     Allergies  Allergen Reactions  . Amlodipine Besy-Benazepril Hcl     REACTION: swelling of feet.  Probably  from Amlodipine component)  . Lipitor [Atorvastatin Calcium]     ? Reaction, ? Swelling=- update 7/20.  Pt recalls his legs hurt and perhaps swelled slightly - JC    Medication list has been reviewed and updated.  Current Outpatient Medications on File Prior to Visit  Medication Sig Dispense Refill  . acetaminophen (TYLENOL) 325 MG tablet Per bottle as needed    . Al Hyd-Mg Tr-Alg Ac-Sod Bicarb (GAVISCON-2 PO) Take by mouth.    . cholecalciferol (VITAMIN D) 1000 UNITS tablet Take 1,000 Units by mouth daily.      . diazepam (VALIUM) 5 MG tablet TAKE 1/2 TO 1 TABLET BY MOUTH AT BEDTIME AS NEEDED FOR SLEEP 30 tablet 1  . famotidine (PEPCID) 20 MG tablet TAKE 1 TABLET BY MOUTH EVERY DAY 30 tablet 1  . Fluticasone Propionate (FLONASE NA) Place into the nose as needed.    . folic acid (FOLVITE) 638 MCG tablet Take 400 mcg by mouth daily.      Marland Kitchen gabapentin (NEURONTIN) 300 MG capsule TAKE 1 OR 2 CAPSULES BY MOUTH AT BEDTIME AS  NEEDED 180 capsule 1  . losartan-hydrochlorothiazide (HYZAAR) 100-12.5 MG tablet Take 1 tablet by mouth daily. 90 tablet 3  . lovastatin (MEVACOR) 10 MG tablet TAKE 1 TABLET BY MOUTH EVERYDAY AT BEDTIME 90 tablet 3  . Omega-3 Fatty Acids (FISH OIL) 1200 MG CAPS Take 1,200 mg by mouth 2 (two) times daily.      . propranolol (INDERAL) 40 MG tablet Take 1 tablet (40 mg  total) by mouth 2 (two) times daily. 180 tablet 3  . vancomycin (VANCOCIN HCL) 125 MG capsule Vancomycin 125 mg 1 p.o. 3 times daily for 7 days then,  Vancomycin 125 mg 1 p.o. twice daily for 7 days then,  Vancomycin 125 mg 1 p.o. daily for 7 days then,  Vancomycin 125 mg 1 p.o. every third day for 6 weeks # 56, no refills. 98 capsule 0  . warfarin (COUMADIN) 5 MG tablet TAKE AS DIRECTED BY ANTICOAGULATION CLINIC 120 tablet 3   No current facility-administered medications on file prior to visit.    Review of Systems:  As per HPI- otherwise negative.   Physical Examination: Vitals:   01/23/20 1016  BP: 128/72  Pulse: 66  Resp: 16  SpO2: 99%   Vitals:   01/23/20 1016  Weight: 201 lb (91.2 kg)  Height: 5\' 10"  (1.778 m)   Body mass index is 28.84 kg/m. Ideal Body Weight: Weight in (lb) to have BMI = 25: 173.9  GEN: no acute distress.  Normal weight for age, looks well and strong for age 89: Atraumatic, Normocephalic.  Ears and Nose: No external deformity. CV: RRR, No M/G/R. No JVD. No thrill. No extra heart sounds. PULM: CTA B, no wheezes, crackles, rhonchi. No retractions. No resp. distress. No accessory muscle use. ABD: S, NT, ND, +BS. No rebound. No HSM. EXTR: No c/c/e PSYCH: Normally interactive. Conversant.    Assessment and Plan: Essential hypertension  Factor 5 Leiden mutation, heterozygous ([place])  Dyslipidemia - Plan: Lipid panel  Screening for prostate cancer - Plan: PSA  [name] colitis   Older gentleman with history of hypertension, factor V Leiden mutation on Coumadin.  He  unfortunately suffered an episode of [name] diarrhea/ coliits this spring, he is still taking vancomycin but his symptoms are now resolved.  We discussed this in detail, in the future we will use antibiotics only when absolutley necessary and be judicious with antibiotic choice Will check lipid and PSA today-plan for 37-month follow-up  Will plan further follow- up pending labs. Moderate medical decision making today This visit occurred during the SARS-CoV-2 public health emergency.  Safety protocols were in place, including screening questions prior to the visit, additional usage of staff PPE, and extensive cleaning of exam room while observing appropriate contact time as indicated for disinfecting solutions.    Signed Lamar Blinks, MD  6/8 received his labs as follows, message to pt  Results for orders placed or performed in visit on 01/23/20  PSA  Result Value Ref Range   PSA 1.83 0.10 - 4.00 ng/mL  Lipid panel  Result Value Ref Range   Cholesterol 154 0 - 200 mg/dL   Triglycerides 245.0 (H) 0.0 - 149.0 mg/dL   HDL 31.60 (L) >39.00 mg/dL   VLDL 49.0 (H) 0.0 - 40.0 mg/dL   Total CHOL/HDL Ratio 5    NonHDL 122.79   LDL cholesterol, direct  Result Value Ref Range   Direct LDL 78.0 mg/dL

## 2020-01-21 NOTE — Patient Instructions (Addendum)
It was great to see you again today- I will be in touch with your labs Please see me in about 6 months and take care!

## 2020-01-23 ENCOUNTER — Ambulatory Visit (INDEPENDENT_AMBULATORY_CARE_PROVIDER_SITE_OTHER): Payer: Medicare Other | Admitting: Family Medicine

## 2020-01-23 ENCOUNTER — Other Ambulatory Visit: Payer: Self-pay

## 2020-01-23 ENCOUNTER — Encounter: Payer: Self-pay | Admitting: Family Medicine

## 2020-01-23 ENCOUNTER — Telehealth: Payer: Self-pay | Admitting: Family Medicine

## 2020-01-23 VITALS — BP 128/72 | HR 66 | Resp 16 | Ht 70.0 in | Wt 201.0 lb

## 2020-01-23 DIAGNOSIS — A0472 Enterocolitis due to Clostridium difficile, not specified as recurrent: Secondary | ICD-10-CM | POA: Diagnosis not present

## 2020-01-23 DIAGNOSIS — D6851 Activated protein C resistance: Secondary | ICD-10-CM

## 2020-01-23 DIAGNOSIS — Z125 Encounter for screening for malignant neoplasm of prostate: Secondary | ICD-10-CM

## 2020-01-23 DIAGNOSIS — E785 Hyperlipidemia, unspecified: Secondary | ICD-10-CM

## 2020-01-23 DIAGNOSIS — I10 Essential (primary) hypertension: Secondary | ICD-10-CM | POA: Diagnosis not present

## 2020-01-23 LAB — PSA: PSA: 1.83 ng/mL (ref 0.10–4.00)

## 2020-01-23 LAB — LDL CHOLESTEROL, DIRECT: Direct LDL: 78 mg/dL

## 2020-01-23 LAB — LIPID PANEL
Cholesterol: 154 mg/dL (ref 0–200)
HDL: 31.6 mg/dL — ABNORMAL LOW (ref >39.00)
NonHDL: 122.79
Total CHOL/HDL Ratio: 5
Triglycerides: 245 mg/dL — ABNORMAL HIGH (ref 0.0–149.0)
VLDL: 49 mg/dL — ABNORMAL HIGH (ref 0.0–40.0)

## 2020-01-23 NOTE — Progress Notes (Signed)
  Chronic Care Management   Note  01/23/2020 Name: Patrick Hatfield MRN: [id number] DOB: 10-13-1934  Patrick Hatfield is a 85 y.o. year old male who is a primary care patient of Copland, Gay Filler, MD. I reached out to Patrick Hatfield by phone today in response to a referral sent by Patrick Hatfield's PCP, Copland, Gay Filler, MD.   Patrick Hatfield was given information about Chronic Care Management services today including:  1. CCM service includes personalized support from designated clinical staff supervised by his physician, including individualized plan of care and coordination with other care providers 2. 24/7 contact phone numbers for assistance for urgent and routine care needs. 3. Service will only be billed when office clinical staff spend 20 minutes or more in a month to coordinate care. 4. Only one practitioner may furnish and bill the service in a calendar month. 5. The patient may stop CCM services at any time (effective at the end of the month) by phone call to the office staff.   Patient agreed to services and verbal consent obtained.   This note is not being shared with the patient for the following reason: To respect privacy (The patient or proxy has requested that the information not be shared).  Follow up plan:   Earney Hamburg Upstream Scheduler

## 2020-01-24 ENCOUNTER — Encounter: Payer: Self-pay | Admitting: Family Medicine

## 2020-01-30 DIAGNOSIS — M1712 Unilateral primary osteoarthritis, left knee: Secondary | ICD-10-CM | POA: Diagnosis not present

## 2020-01-30 DIAGNOSIS — M545 Low back pain: Secondary | ICD-10-CM | POA: Diagnosis not present

## 2020-02-01 ENCOUNTER — Other Ambulatory Visit: Payer: Self-pay | Admitting: Family Medicine

## 2020-02-01 DIAGNOSIS — M62838 Other muscle spasm: Secondary | ICD-10-CM

## 2020-02-11 ENCOUNTER — Other Ambulatory Visit: Payer: Self-pay | Admitting: Nurse Practitioner

## 2020-02-23 ENCOUNTER — Other Ambulatory Visit: Payer: Self-pay

## 2020-02-23 ENCOUNTER — Ambulatory Visit (INDEPENDENT_AMBULATORY_CARE_PROVIDER_SITE_OTHER): Payer: Medicare Other | Admitting: General Practice

## 2020-02-23 DIAGNOSIS — Z86718 Personal history of other venous thrombosis and embolism: Secondary | ICD-10-CM

## 2020-02-23 DIAGNOSIS — Z7901 Long term (current) use of anticoagulants: Secondary | ICD-10-CM | POA: Diagnosis not present

## 2020-02-23 LAB — POCT INR: INR: 2.5 (ref 2.0–3.0)

## 2020-02-23 NOTE — Patient Instructions (Addendum)
Pre visit review using our clinic review tool, if applicable. No additional management support is needed unless otherwise documented below in the visit note.  Continue to take 1/2 tablet all days except 1 tablet on Tuesday and Saturdays.  Re-check in 6 weeks.

## 2020-02-23 NOTE — Progress Notes (Signed)
Medical screening examination/treatment/procedure(s) were performed by non-physician practitioner and as supervising physician I was immediately available for consultation/collaboration. I agree with above. Tarun Aubre [place], MD   

## 2020-03-05 ENCOUNTER — Other Ambulatory Visit: Payer: Self-pay | Admitting: Nurse Practitioner

## 2020-03-18 ENCOUNTER — Other Ambulatory Visit: Payer: Self-pay | Admitting: Family Medicine

## 2020-03-18 DIAGNOSIS — G250 Essential tremor: Secondary | ICD-10-CM

## 2020-03-27 ENCOUNTER — Telehealth: Payer: Medicare Other

## 2020-03-30 ENCOUNTER — Ambulatory Visit: Payer: Medicare Other | Admitting: Pharmacist

## 2020-03-30 ENCOUNTER — Other Ambulatory Visit: Payer: Self-pay

## 2020-03-30 DIAGNOSIS — I10 Essential (primary) hypertension: Secondary | ICD-10-CM

## 2020-03-30 DIAGNOSIS — E785 Hyperlipidemia, unspecified: Secondary | ICD-10-CM

## 2020-03-30 NOTE — Chronic Care Management (AMB) (Deleted)
Chronic Care Management Pharmacy  Name: Patrick Hatfield  MRN: [id number] DOB: Sep 20, 1934  Chief Complaint/ HPI  Patrick Hatfield,  85 y.o. , male presents for their Initial CCM visit with the clinical pharmacist via telephone due to COVID-19 Pandemic.  PCP : Darreld Mclean, MD  Their chronic conditions include: {CHL AMB CHRONIC MEDICAL CONDITIONS:[phone]}  Office Visits: 01/23/20: Visit w/ Dr. Lorelei Pont - No med changes noted  Consult Visit: 02/23/20: Anticoag visit w/ Meriam Sprague, RN - INR Goal 2-3. INR: 2.5. Maintenance Dose: 51mTu, 2.557mAOD. No med changes noted. RTC 6 weeks.   01/30/20: Ortho visit w/ Dr. GiGladstone Lighter started prednisone?  01/12/20: Anticoag visit w/ CyMeriam SpragueRN - INR Goal 2-3. INR: 2.1. Maintenance Dose: 10m73m, 2.10mg58mD. No med changes noted. RTC 6 weeks.   12/28/19: Gastro visit w/ CollCarl Best - Hx of C.diff tx with PO vanc. No evidence of Barrett's esophagus per 2016 EGD. Complete PO vanc course, florastor for 6-8 weeks, stay hydrated. PPI D/C by patient due to concern for C.diff risk. Famotidine 20mg7mly, gaviscon PRN.  12/05/19: GastrGertie Feyt w/ ColleCarl Best- Diarrhea of presumed infectious origin. Cefdinir for bronchitis 11/15/19. C.Diff PCR ordered. C.Diff positive and prescribed PO Vanc  Urgent Care/ED Visit: 01/03/20: [facility] - Back pain. No UTI. Most likely [facility]K pain. Tylenol and heat.   11/22/19: [facility] - Suspected diverticulitis   Medications: Outpatient Encounter Medications as of 03/30/2020  Medication Sig   acetaminophen (TYLENOL) 325 MG tablet Per bottle as needed   Al Hyd-Mg Tr-Alg Ac-Sod Bicarb (GAVISCON-2 PO) Take by mouth.   cholecalciferol (VITAMIN D) 1000 UNITS tablet Take 1,000 Units by mouth daily.     diazepam (VALIUM) 5 MG tablet TAKE 1/2 TO 1 TABLET BY MOUTH AT BEDTIME AS NEEDED FOR SLEEP   famotidine (PEPCID) 20 MG tablet TAKE 1 TABLET BY MOUTH EVERY [name]   Fluticasone  Propionate (FLONASE NA) Place into the nose as needed.   folic acid (FOLVITE) 400 M027tablet Take 400 mcg by mouth daily.     gabapentin (NEURONTIN) 300 MG capsule TAKE 1 OR 2 CAPSULES BY MOUTH AT BEDTIME AS NEEDED   losartan-hydrochlorothiazide (HYZAAR) 100-12.5 MG tablet Take 1 tablet by mouth daily.   lovastatin (MEVACOR) 10 MG tablet TAKE 1 TABLET BY MOUTH EVERYDAY AT BEDTIME   Omega-3 Fatty Acids (FISH OIL) 1200 MG CAPS Take 1,200 mg by mouth 2 (two) times daily.     propranolol (INDERAL) 40 MG tablet TAKE 1 TABLET BY MOUTH TWICE A Debra Colon   tamsulosin (FLOMAX) 0.4 MG CAPS capsule TAKE 1 CAPSULE (0.4 MG TOTAL) BY MOUTH DAILY.   vancomycin (VANCOCIN HCL) 125 MG capsule Vancomycin 125 mg 1 p.o. 3 times daily for 7 days then,  Vancomycin 125 mg 1 p.o. twice daily for 7 days then,  Vancomycin 125 mg 1 p.o. daily for 7 days then,  Vancomycin 125 mg 1 p.o. every third Sabrea Sankey for 6 weeks # 56, no refills.   warfarin (COUMADIN) 5 MG tablet TAKE AS DIRECTED BY ANTICOAGULATION CLINIC   No facility-administered encounter medications on file as of 03/30/2020.     Current Diagnosis/Assessment:  Goals Addressed   None     Hypertension   BP goal is:  <140/90  Office blood pressures are  BP Readings from Last 3 Encounters:  01/23/20 128/72  01/03/20 131/74  12/28/19 100/70   Patient checks BP at home {CHL HP BP Monitoring [phone]} Patient home BP  readings are ranging: ***  Patient has failed these meds in the past: None noted  Patient is currently controlled on the following medications:   Losartan/hctz 100/12.57m daily  Propranolol 499mtwice daily  We discussed {CHL HP Upstream Pharmacy discussion:[phone]}  Plan -Continue current medications   Hyperlipidemia   LDL goal <100  Lipid Panel     Component Value Date/Time   CHOL 154 01/23/2020 1053   TRIG 245.0 (H) 01/23/2020 1053   HDL 31.60 (L) 01/23/2020 1053   LDLCALC 164 (H) 03/02/2019 1049    LDLDIRECT 78.0 01/23/2020 1053    Hepatic Function Latest Ref Rng & Units 12/05/2019 11/22/2019 03/02/2019  Total Protein 6.0 - 8.3 g/dL 7.3 8.0 8.0  Albumin 3.5 - 5.2 g/dL 3.7 3.7 4.7  AST 0 - 37 U/L 20 23 38(H)  ALT 0 - 53 U/L 17 30 53  Alk Phosphatase 39 - 117 U/L 64 53 61  Total Bilirubin 0.2 - 1.2 mg/dL 1.5(H) 2.7(H) 2.7(H)  Bilirubin, Direct 0.0 - 0.3 mg/dL - - -     The ASCVD Risk score (GMikey BussingC Jr., et al., 2013) failed to calculate for the following reasons:   The 2013 ASCVD risk score is only valid for ages 4018o 7974 Patient has failed these meds in past: None noted  Patient is currently controlled, except for TRIG on the following medications:   Lovastatin 1027maily at bedtime  Omega 3 Fatty Acid 1200m27mice daily  We discussed:  {CHL HP Upstream Pharmacy discussion:[phone]}  Plan -Continue current medications  Factor 5 Leiden Mutation    Patient has failed these meds in past: None noted  Patient is currently controlled on the following medications:  Warfarin 5mg 45mly as directed (5mgT,10m5mg AO67mer anticoag clinic)  We discussed:  {CHL HP Upstream Pharmacy discussion:[phone]}  Plan -Continue current medications   Hx of Barrett's Esophagus/GERD    Patient has failed these meds in past: pantoprazole (C.diff concern) Patient is currently {CHL Controlled/Uncontrolled:[phone]} on the following medications:  Famotidine 20mg da79m Gaviscon as needed  We discussed:  {CHL HP Upstream Pharmacy discussion:[phone]}  Plan -Continue current medications   BPH   PSA  Date Value Ref Range Status  01/23/2020 1.83 0.10 - 4.00 ng/mL Final    Comment:    Test performed using Access Hybritech PSA Assay, a parmagnetic partical, chemiluminecent immunoassay.  03/02/2019 2.02 0.10 - 4.00 ng/mL Final    Comment:    Test performed using Access Hybritech PSA Assay, a parmagnetic partical, chemiluminecent immunoassay.  02/22/2018 2.04 0.10 - 4.00 ng/mL  Final    Comment:    Test performed using Access Hybritech PSA Assay, a parmagnetic partical, chemiluminecent immunoassay.     Patient has failed these meds in past: None noted  Patient is currently controlled on the following medications:   Tamsulosin 0.4mg dail44mOvernight Urination? Bladder emptying? Solid Stream?  We discussed:  ***  Plan -Continue current medications   Miscellaneous Meds Acetaminophen Vitamin D 1000 units Diazepam 5mg  Flon52m Folic Acid 400mcg [id number] 300mg 1-2 H34m  Meds to D/C from list

## 2020-03-30 NOTE — Patient Instructions (Addendum)
Visit Information  Goals Addressed            This Visit's Progress   . Chronic Care Management Pharmacy Care Plan       CARE PLAN ENTRY (see longitudinal plan of care for additional care plan information)  Current Barriers:  . Chronic Disease Management support, education, and care coordination needs related to Hypertension, Hyperlipidemia, Factor 5 Leiden, Hx of Barrett's Esophagus/Hx of C.diff/GERD, BPH   Hypertension BP Readings from Last 3 Encounters:  01/23/20 128/72  01/03/20 131/74  12/28/19 100/70   . Pharmacist Clinical Goal(s): o Over the next 90 days, patient will work with PharmD and providers to maintain BP goal <140/90 . Current regimen:  . Losartan/hctz 100/12.5mg  daily AM . Propranolol 40mg  #2 daily (for tremors) . Patient self care activities - Over the next 90 days, patient will: o Check BP daily, document, and provide at future appointments o Ensure daily salt intake < 2300 mg/Patrick Hatfield  Hyperlipidemia Lab Results  Component Value Date/Time   LDLCALC 164 (H) 03/02/2019 10:49 AM   LDLDIRECT 78.0 01/23/2020 10:53 AM   . Pharmacist Clinical Goal(s): o Over the next 90 days, patient will work with PharmD and providers to maintain LDL goal < 100, achieve TRIG goal <150 . Current regimen:  . Lovastatin 10mg  daily at bedtime . Omega 3 Fatty Acid 1200mg  #2 daily AM . Interventions: o Discussed diet and exercise . Patient self care activities - Over the next 90 days, patient will: o Maintain cholesterol medication regimen.   Medication management . Pharmacist Clinical Goal(s): o Over the next 90 days, patient will work with PharmD and providers to maintain optimal medication adherence . Current pharmacy: [facility] . Interventions o Comprehensive medication review performed. o Continue current medication management strategy . Patient self care activities - Over the next 90 days, patient will: o Focus on medication adherence by filling and taking medications  appropriately  o Take medications as prescribed o Report any questions or concerns to PharmD and/or provider(s)  Initial goal documentation        Patrick Hatfield was given information about Chronic Care Management services today including:  1. CCM service includes personalized support from designated clinical staff supervised by his physician, including individualized plan of care and coordination with other care providers 2. 24/7 contact phone numbers for assistance for urgent and routine care needs. 3. Standard insurance, coinsurance, copays and deductibles apply for chronic care management only during months in which we provide at least 20 minutes of these services. Most insurances cover these services at 100%, however patients may be responsible for any copay, coinsurance and/or deductible if applicable. This service may help you avoid the need for more expensive face-to-face services. 4. Only one practitioner may furnish and bill the service in a calendar month. 5. The patient may stop CCM services at any time (effective at the end of the month) by phone call to the office staff.  Patient agreed to services and verbal consent obtained.   The patient verbalized understanding of instructions provided today and agreed to receive a mailed copy of patient instruction and/or educational materials. Telephone follow up appointment with pharmacy team member scheduled for: 10/01/2020   [name], PharmD Clinical Pharmacist [name] [phone]   High Triglycerides Eating Plan Triglycerides are a type of fat in the blood. High levels of triglycerides can increase your risk of heart disease and stroke. If your triglyceride levels are high, choosing the right foods can  help lower your triglycerides and keep your heart healthy. Work with your health care provider or a diet and nutrition specialist (dietitian) to develop an eating plan that is right for  you. What are tips for following this plan? General guidelines   Lose weight, if you are overweight. For most people, losing 5-10 lbs (2-5 kg) helps lower triglyceride levels. A weight-loss plan may include. ? 30 minutes of exercise at least 5 days a week. ? Reducing the amount of calories, sugar, and fat you eat.  Eat a wide variety of fresh fruits, vegetables, and whole grains. These foods are high in fiber.  Eat foods that contain healthy fats, such as fatty fish, nuts, seeds, and olive oil.  Avoid foods that are high in added sugar, added salt (sodium), saturated fat, and trans fat.  Avoid low-fiber, refined carbohydrates such as white bread, crackers, noodles, and white rice.  Avoid foods with partially hydrogenated oils (trans fats), such as fried foods or stick margarine.  Limit alcohol intake to no more than 1 drink a Patrick Hatfield for nonpregnant women and 2 drinks a Patrick Hatfield for men. One drink equals 12 oz of beer, 5 oz of wine, or 1 oz of hard liquor. Your health care provider may recommend that you drink less depending on your overall health. Reading food labels  Check food labels for the amount of saturated fat. Choose foods with no or very little saturated fat.  Check food labels for the amount of trans fat. Choose foods with no trans fat.  Check food labels for the amount of cholesterol. Choose foods low in cholesterol. Ask your dietitian how much cholesterol you should have each Patrick Hatfield.  Check food labels for the amount of sodium. Choose foods with less than 140 milligrams (mg) per serving. Shopping  Buy dairy products labeled as nonfat (skim) or low-fat (1%).  Avoid buying processed or prepackaged foods. These are often high in added sugar, sodium, and fat. Cooking  Choose healthy fats when cooking, such as olive oil or canola oil.  Cook foods using lower fat methods, such as baking, broiling, boiling, or grilling.  Make your own sauces, dressings, and marinades when possible,  instead of buying them. Store-bought sauces, dressings, and marinades are often high in sodium and sugar. Meal planning  Eat more home-cooked food and less restaurant, buffet, and fast food.  Eat fatty fish at least 2 times each week. Examples of fatty fish include salmon, trout, mackerel, tuna, and herring.  If you eat whole eggs, do not eat more than 3 egg yolks per week. What foods are recommended? The items listed may not be a complete list. Talk with your dietitian about what dietary choices are best for you. Grains Whole wheat or whole grain breads, crackers, cereals, and pasta. Unsweetened oatmeal. Bulgur. Barley. Quinoa. Brown rice. Whole wheat flour tortillas. Vegetables Fresh or frozen vegetables. Low-sodium canned vegetables. Fruits All fresh, canned (in natural juice), or frozen fruits. Meats and other protein foods Skinless chicken or [place]. Ground chicken or [place]. Lean cuts of pork, trimmed of fat. Fish and seafood, especially salmon, trout, and herring. Egg whites. Dried beans, peas, or lentils. Unsalted nuts or seeds. Unsalted canned beans. Natural peanut or almond butter. Dairy Low-fat dairy products. Skim or low-fat (1%) milk. Reduced fat (2%) and low-sodium cheese. Low-fat ricotta cheese. Low-fat cottage cheese. Plain, low-fat yogurt. Fats and oils Tub margarine without trans fats. Light or reduced-fat mayonnaise. Light or reduced-fat salad dressings. Avocado. Safflower, olive, sunflower, soybean, and  canola oils. What foods are not recommended? The items listed may not be a complete list. Talk with your dietitian about what dietary choices are best for you. Grains White bread. White (regular) pasta. White rice. Cornbread. Bagels. Pastries. Crackers that contain trans fat. Vegetables Creamed or fried vegetables. Vegetables in a cheese sauce. Fruits Sweetened dried fruit. Canned fruit in syrup. Fruit juice. Meats and other protein foods Fatty cuts of meat. Ribs.  Chicken wings. Berniece Salines. Sausage. Bologna. Salami. Chitterlings. Fatback. Hot dogs. Bratwurst. Packaged lunch meats. Dairy Whole or reduced-fat (2%) milk. Half-and-half. Cream cheese. Full-fat or sweetened yogurt. Full-fat cheese. Nondairy creamers. Whipped toppings. Processed cheese or cheese spreads. Cheese curds. Beverages Alcohol. Sweetened drinks, such as soda, lemonade, fruit drinks, or punches. Fats and oils Butter. Stick margarine. Lard. Shortening. Ghee. Bacon fat. Tropical oils, such as coconut, palm kernel, or palm oils. Sweets and desserts Corn syrup. Sugars. Honey. Molasses. Candy. Jam and jelly. Syrup. Sweetened cereals. Cookies. Pies. Cakes. Donuts. Muffins. Ice cream. Condiments Store-bought sauces, dressings, and marinades that are high in sugar, such as ketchup and barbecue sauce. Summary  High levels of triglycerides can increase the risk of heart disease and stroke. Choosing the right foods can help lower your triglycerides.  Eat plenty of fresh fruits, vegetables, and whole grains. Choose low-fat dairy and lean meats. Eat fatty fish at least twice a week.  Avoid processed and prepackaged foods with added sugar, sodium, saturated fat, and trans fat.  If you need suggestions or have questions about what types of food are good for you, talk with your health care provider or a dietitian. This information is not intended to replace advice given to you by your health care provider. Make sure you discuss any questions you have with your health care provider. Document Revised: 07/17/2017 Document Reviewed: 10/07/2016 Elsevier Patient Education  2020 [employer].

## 2020-03-30 NOTE — Chronic Care Management (AMB) (Signed)
Chronic Care Management Pharmacy  Name: Patrick Hatfield  MRN: [id number] DOB: 1935/04/04  Chief Complaint/ HPI  Patrick Hatfield,  85 y.o. , male presents for their Initial CCM visit with the clinical pharmacist via telephone due to COVID-19 Pandemic.  PCP : Darreld Mclean, MD  Their chronic conditions include: Hypertension, Hyperlipidemia, Factor 5 Leiden, Hx of Barrett's Esophagus/Hx of C.diff/GERD, BPH  Office Visits: 01/23/20: Visit w/ Dr. Lorelei Pont - No med changes noted  Consult Visit: 02/23/20: Anticoag visit w/ Meriam Sprague, RN - INR Goal 2-3. INR: 2.5. Maintenance Dose: 443mTu, 2.579mAOD. No med changes noted. RTC 6 weeks.   01/30/20: Ortho visit w/ Dr. GiGladstone Lighter started prednisone?  01/12/20: Anticoag visit w/ CyMeriam SpragueRN - INR Goal 2-3. INR: 2.1. Maintenance Dose: 43m56m, 2.43mg70mD. No med changes noted. RTC 6 weeks.   12/28/19: Gastro visit w/ CollCarl Best - Hx of C.diff tx with PO vanc. No evidence of Barrett's esophagus per 2016 EGD. Complete PO vanc course, florastor for 6-8 weeks, stay hydrated. PPI D/C by patient due to concern for C.diff risk. Famotidine 20mg10mly, gaviscon PRN.  12/05/19: GastrGertie Feyt w/ ColleCarl Best- Diarrhea of presumed infectious origin. Cefdinir for bronchitis 11/15/19. C.Diff PCR ordered. C.Diff positive and prescribed PO Vanc  Urgent Care/ED Visit: 01/03/20: [facility] - Back pain. No UTI. Most likely MSK pain. Tylenol and heat.   11/22/19: [facility] - Suspected diverticulitis  Medications: Outpatient Encounter Medications as of 03/30/2020  Medication Sig Note  . acetaminophen (TYLENOL) 325 MG tablet Per bottle as needed 03/30/2020: As needed  . Al Hyd-Mg Tr-Alg Ac-Sod Bicarb (GAVISCON-2 PO) Take by mouth. 03/30/2020: As needed  . cholecalciferol (VITAMIN D) 1000 UNITS tablet Take 1,000 Units by mouth daily.     . diazepam (VALIUM) 5 MG tablet TAKE 1/2 TO 1 TABLET BY MOUTH AT BEDTIME AS NEEDED FOR SLEEP  03/30/2020: As needed  . famotidine (PEPCID) 20 MG tablet TAKE 1 TABLET BY MOUTH EVERY Cyndal Kasson   . Fluticasone Propionate (FLONASE NA) Place into the nose as needed. 03/30/2020: As needed  . folic acid (FOLVITE) 400 M073tablet Take 400 mcg by mouth daily.     . gabMarland Kitchenpentin (NEURONTIN) 300 MG capsule TAKE 1 OR 2 CAPSULES BY MOUTH AT BEDTIME AS NEEDED 03/30/2020: As needed. Uses for sleep  . losartan-hydrochlorothiazide (HYZAAR) 100-12.5 MG tablet Take 1 tablet by mouth daily.   . lovMarland Kitchenstatin (MEVACOR) 10 MG tablet TAKE 1 TABLET BY MOUTH EVERYDAY AT BEDTIME   . Omega-3 Fatty Acids (FISH OIL) 1200 MG CAPS Take 2,400 mg by mouth daily.    . propranolol (INDERAL) 40 MG tablet TAKE 1 TABLET BY MOUTH TWICE A Talar Fraley 03/30/2020: Take #2 daily  . tamsulosin (FLOMAX) 0.4 MG CAPS capsule TAKE 1 CAPSULE (0.4 MG TOTAL) BY MOUTH DAILY.   . vancomycin (VANCOCIN HCL) 125 MG capsule Vancomycin 125 mg 1 p.o. 3 times daily for 7 days then,  Vancomycin 125 mg 1 p.o. twice daily for 7 days then,  Vancomycin 125 mg 1 p.o. daily for 7 days then,  Vancomycin 125 mg 1 p.o. every third Detravion Tester for 6 weeks # 56, no refills. 03/30/2020: Now using every 3rd [name]  . warfarin (COUMADIN) 5 MG tablet TAKE AS DIRECTED BY ANTICOAGULATION CLINIC    No facility-administered encounter medications on file as of 03/30/2020.   SDOH Screenings   Alcohol Screen:   . Last Alcohol Screening Score (AUDIT):   Depression (PHQ2-9):   .  PHQ-2 Score:   Financial Resource Strain:   . Difficulty of Paying Living Expenses:   Food Insecurity:   . Worried About Charity fundraiser in the Last Year:   . [place] in the Last Year:   Housing:   . Last Housing Risk Score:   Physical Activity:   . Days of Exercise per Week:   . Minutes of Exercise per Session:   Social Connections:   . Frequency of Communication with Friends and Family:   . Frequency of Social Gatherings with Friends and Family:   . Attends Religious Services:   . Active Member of  Clubs or Organizations:   . Attends Archivist Meetings:   Marland Kitchen Marital Status:   Stress:   . Feeling of Stress :   Tobacco Use: Medium Risk  . Smoking Tobacco Use: Former Smoker  . Smokeless Tobacco Use: Never Used  Transportation Needs:   . Film/video editor (Medical):   Marland Kitchen Lack of Transportation (Non-Medical):      Current Diagnosis/Assessment:  Goals Addressed            This Visit's Progress   . Chronic Care Management Pharmacy Care Plan       CARE PLAN ENTRY (see longitudinal plan of care for additional care plan information)  Current Barriers:  . Chronic Disease Management support, education, and care coordination needs related to Hypertension, Hyperlipidemia, Factor 5 Leiden, Hx of Barrett's Esophagus/Hx of C.diff/GERD, BPH   Hypertension BP Readings from Last 3 Encounters:  01/23/20 128/72  01/03/20 131/74  12/28/19 100/70   . Pharmacist Clinical Goal(s): o Over the next 90 days, patient will work with PharmD and providers to maintain BP goal <140/90 . Current regimen:  . Losartan/hctz 100/12.8m daily AM . Propranolol 462m#2 daily (for tremors) . Patient self care activities - Over the next 90 days, patient will: o Check BP daily, document, and provide at future appointments o Ensure daily salt intake < 2300 mg/[name]  Hyperlipidemia Lab Results  Component Value Date/Time   LDLCALC 164 (H) 03/02/2019 10:49 AM   LDLDIRECT 78.0 01/23/2020 10:53 AM   . Pharmacist Clinical Goal(s): o Over the next 90 days, patient will work with PharmD and providers to maintain LDL goal < 100, achieve TRIG goal <150 . Current regimen:  . Lovastatin 1034maily at bedtime . Omega 3 Fatty Acid 1200m82m daily AM . Interventions: o Discussed diet and exercise . Patient self care activities - Over the next 90 days, patient will: o Maintain cholesterol medication regimen.   Medication management . Pharmacist Clinical Goal(s): o Over the next 90 days, patient will  work with PharmD and providers to maintain optimal medication adherence . Current pharmacy: [facility] . Interventions o Comprehensive medication review performed. o Continue current medication management strategy . Patient self care activities - Over the next 90 days, patient will: o Focus on medication adherence by filling and taking medications appropriately  o Take medications as prescribed o Report any questions or concerns to PharmD and/or provider(s)  Initial goal documentation      Social Hx:  Married 60 years.  4 daughters and 6 grandchildren.  Hypertension   BP goal is:  <140/90  Office blood pressures are  BP Readings from Last 3 Encounters:  01/23/20 128/72  01/03/20 131/74  12/28/19 100/70   Patient checks BP at home twice daily Patient home BP readings are ranging: reports average as 125/70  Patient has failed these meds in  the past: None noted  Patient is currently controlled on the following medications:  . Losartan/hctz 100/12.1m daily AM . Propranolol 438m#2 daily (for tremors)  States his BP improved when he lost weight and improved his diet.   Plan -Continue current medications   Hyperlipidemia   LDL goal <100 TRIG goal <150  Lipid Panel     Component Value Date/Time   CHOL 154 01/23/2020 1053   TRIG 245.0 (H) 01/23/2020 1053   HDL 31.60 (L) 01/23/2020 1053   LDLCALC 164 (H) 03/02/2019 1049   LDLDIRECT 78.0 01/23/2020 1053    Hepatic Function Latest Ref Rng & Units 12/05/2019 11/22/2019 03/02/2019  Total Protein 6.0 - 8.3 g/dL 7.3 8.0 8.0  Albumin 3.5 - 5.2 g/dL 3.7 3.7 4.7  AST 0 - 37 U/L 20 23 38(H)  ALT 0 - 53 U/L 17 30 53  Alk Phosphatase 39 - 117 U/L 64 53 61  Total Bilirubin 0.2 - 1.2 mg/dL 1.5(H) 2.7(H) 2.7(H)  Bilirubin, Direct 0.0 - 0.3 mg/dL - - -     The ASCVD Risk score (GMikey BussingC Jr., et al., 2013) failed to calculate for the following reasons:   The 2013 ASCVD risk score is only valid for ages 4068o 7959 Patient has failed  these meds in past: None noted  Patient is currently controlled, except for TRIG on the following medications:  . Lovastatin 1083maily at bedtime . Omega 3 Fatty Acid 1200m67m daily AM  Realizes his TRIG are high. He has stopped drinking wine completely and now drinks low cal beer. He is hopeful his TRIG will be improved.   Diet B: over easy eggs (once a month with gravy), usually small bowel of cereal with banana, or #2 hard boiled eggs L: [place] and bacon sandwich D: Salads  Exercise Tries to walk 1-2 times per Shenae Bonanno.  Plan -Continue current medications  Factor 5 Leiden Mutation    Patient has failed these meds in past: None noted  Patient is currently controlled on the following medications: . Warfarin 5mg 52mly as directed (5mgT,74m, 2.5mg AO65mer anticoag clinic)  Plan -Continue current medications   Hx of Barrett's Esophagus/GERD/Hx of C.Diff   Patient has failed these meds in past: pantoprazole (C.diff concern) Patient is currently controlled on the following medications: . Famotidine 20mg da40mevening meal . Gaviscon as needed . Florastor Probiotic twice daily  Rarely has breakthrough symptoms Triggers: Over easy eggs, pork skins, food outside of the norm  Plan -Continue current medications   BPH   PSA  Date Value Ref Range Status  01/23/2020 1.83 0.10 - 4.00 ng/mL Final    Comment:    Test performed using Access Hybritech PSA Assay, a parmagnetic partical, chemiluminecent immunoassay.  03/02/2019 2.02 0.10 - 4.00 ng/mL Final    Comment:    Test performed using Access Hybritech PSA Assay, a parmagnetic partical, chemiluminecent immunoassay.  02/22/2018 2.04 0.10 - 4.00 ng/mL Final    Comment:    Test performed using Access Hybritech PSA Assay, a parmagnetic partical, chemiluminecent immunoassay.     Patient has failed these meds in past: None noted  Patient is currently controlled on the following medications:  . Tamsulosin 0.4mg dail37mvening  meal  Felt he initially had reactions to tamsulosin, but recently restarted it about 2 weeks ago.  Overnight Urination? 3-4 times per night to 1-2 times per night Bladder emptying? Takes a while to empty Solid Stream? Yes then goes into dribbling for  another 60-90 seconds  Plan -Continue current medications   Medication Management   [facility] Uses pill box? Yes Pt endorses 100% compliance  Has a pill box for AM and PM meds.  Plan -Continue current medication management strategy  Follow up: 6 month phone visit  Miscellaneous Meds Acetaminophen PRN Vitamin D 1000 units AM Diazepam 50m PRN insomnia Flonase PRN Folic Acid 4770HEKAM per Dr. HLinna Darnerrecommendation for prostate health Gabapentin 3035m1-2 HSPRN

## 2020-04-05 ENCOUNTER — Other Ambulatory Visit: Payer: Self-pay

## 2020-04-05 ENCOUNTER — Ambulatory Visit (INDEPENDENT_AMBULATORY_CARE_PROVIDER_SITE_OTHER): Payer: Medicare Other | Admitting: General Practice

## 2020-04-05 DIAGNOSIS — Z7901 Long term (current) use of anticoagulants: Secondary | ICD-10-CM | POA: Diagnosis not present

## 2020-04-05 DIAGNOSIS — Z86718 Personal history of other venous thrombosis and embolism: Secondary | ICD-10-CM

## 2020-04-05 LAB — POCT INR: INR: 2 (ref 2.0–3.0)

## 2020-04-05 NOTE — Patient Instructions (Addendum)
Pre visit review using our clinic review tool, if applicable. No additional management support is needed unless otherwise documented below in the visit note.  Continue to take 1/2 tablet all days except 1 tablet on Tuesday and Saturdays.  Re-check in 6 weeks.

## 2020-04-05 NOTE — Progress Notes (Signed)
Medical screening examination/treatment/procedure(s) were performed by non-physician practitioner and as supervising physician I was immediately available for consultation/collaboration. I agree with above. Oneill Katleen Carraway, MD   

## 2020-04-08 ENCOUNTER — Other Ambulatory Visit: Payer: Self-pay | Admitting: Family Medicine

## 2020-04-11 ENCOUNTER — Encounter: Payer: Self-pay | Admitting: Family Medicine

## 2020-04-18 ENCOUNTER — Other Ambulatory Visit: Payer: Self-pay | Admitting: Nurse Practitioner

## 2020-04-19 ENCOUNTER — Other Ambulatory Visit: Payer: Self-pay | Admitting: Family Medicine

## 2020-04-24 NOTE — Telephone Encounter (Signed)
Beth, pls send in Rx for Famotidine 20mg  one po bid #60 with 2 additional refills and let patient know when done. Thx

## 2020-04-25 ENCOUNTER — Other Ambulatory Visit: Payer: Self-pay

## 2020-04-25 MED ORDER — FAMOTIDINE 20 MG PO TABS: 20.0000 mg | ORAL_TABLET | Freq: Two times a day (BID) | ORAL | 2 refills | Status: DC

## 2020-04-27 ENCOUNTER — Encounter: Payer: Self-pay | Admitting: Family Medicine

## 2020-04-27 ENCOUNTER — Emergency Department (HOSPITAL_BASED_OUTPATIENT_CLINIC_OR_DEPARTMENT_OTHER): Payer: Medicare Other

## 2020-04-27 ENCOUNTER — Encounter (HOSPITAL_BASED_OUTPATIENT_CLINIC_OR_DEPARTMENT_OTHER): Payer: Self-pay | Admitting: Emergency Medicine

## 2020-04-27 ENCOUNTER — Other Ambulatory Visit: Payer: Self-pay | Admitting: Nurse Practitioner

## 2020-04-27 ENCOUNTER — Emergency Department (HOSPITAL_BASED_OUTPATIENT_CLINIC_OR_DEPARTMENT_OTHER)
Admission: EM | Admit: 2020-04-27 | Discharge: 2020-04-27 | Disposition: A | Discharge status: Home / Self Care | Payer: Medicare Other | Attending: Emergency Medicine | Admitting: Emergency Medicine

## 2020-04-27 ENCOUNTER — Other Ambulatory Visit: Payer: Self-pay

## 2020-04-27 DIAGNOSIS — U071 COVID-19: Secondary | ICD-10-CM | POA: Insufficient documentation

## 2020-04-27 DIAGNOSIS — I10 Essential (primary) hypertension: Secondary | ICD-10-CM | POA: Insufficient documentation

## 2020-04-27 DIAGNOSIS — R05 Cough: Secondary | ICD-10-CM | POA: Diagnosis not present

## 2020-04-27 DIAGNOSIS — M549 Dorsalgia, unspecified: Secondary | ICD-10-CM | POA: Insufficient documentation

## 2020-04-27 DIAGNOSIS — Z7901 Long term (current) use of anticoagulants: Secondary | ICD-10-CM | POA: Diagnosis not present

## 2020-04-27 DIAGNOSIS — Z6827 Body mass index (BMI) 27.0-27.9, adult: Secondary | ICD-10-CM

## 2020-04-27 DIAGNOSIS — Z79899 Other long term (current) drug therapy: Secondary | ICD-10-CM | POA: Insufficient documentation

## 2020-04-27 DIAGNOSIS — J1282 Pneumonia due to coronavirus disease 2019: Secondary | ICD-10-CM | POA: Diagnosis not present

## 2020-04-27 DIAGNOSIS — J069 Acute upper respiratory infection, unspecified: Secondary | ICD-10-CM | POA: Diagnosis not present

## 2020-04-27 DIAGNOSIS — Z8709 Personal history of other diseases of the respiratory system: Secondary | ICD-10-CM

## 2020-04-27 DIAGNOSIS — Z7951 Long term (current) use of inhaled steroids: Secondary | ICD-10-CM | POA: Insufficient documentation

## 2020-04-27 DIAGNOSIS — J45909 Unspecified asthma, uncomplicated: Secondary | ICD-10-CM | POA: Diagnosis not present

## 2020-04-27 DIAGNOSIS — R0789 Other chest pain: Secondary | ICD-10-CM | POA: Diagnosis not present

## 2020-04-27 DIAGNOSIS — Z87891 Personal history of nicotine dependence: Secondary | ICD-10-CM | POA: Diagnosis not present

## 2020-04-27 DIAGNOSIS — Z86718 Personal history of other venous thrombosis and embolism: Secondary | ICD-10-CM | POA: Insufficient documentation

## 2020-04-27 LAB — URINALYSIS, ROUTINE W REFLEX MICROSCOPIC
Glucose, UA: NEGATIVE mg/dL
Hgb urine dipstick: NEGATIVE
Ketones, ur: NEGATIVE mg/dL
Leukocytes,Ua: NEGATIVE
Nitrite: NEGATIVE
Protein, ur: NEGATIVE mg/dL
Specific Gravity, Urine: 1.025 (ref 1.005–1.030)
pH: 6 (ref 5.0–8.0)

## 2020-04-27 LAB — SARS CORONAVIRUS 2 BY RT PCR (HOSPITAL ORDER, PERFORMED IN ~~LOC~~ HOSPITAL LAB): SARS Coronavirus 2: POSITIVE — AB

## 2020-04-27 MED ORDER — BENZONATATE 100 MG PO CAPS: 100.0000 mg | ORAL_CAPSULE | Freq: Three times a day (TID) | ORAL | 0 refills | Status: DC

## 2020-04-27 NOTE — Discharge Instructions (Signed)
Take tylenol 2 pills 4 times a day and motrin 4 pills 3 times a day.  Drink plenty of fluids.  Return for worsening shortness of breath, headache, confusion. Follow up with your family doctor.   

## 2020-04-27 NOTE — Progress Notes (Signed)
I connected by phone with Patrick Hatfield on 04/27/2020 at 1:31 PM to discuss the potential use of a new treatment for mild to moderate COVID-19 viral infection in non-hospitalized patients.  This patient is a 85 y.o. male that meets the FDA criteria for Emergency Use Authorization of COVID monoclonal antibody casirivimab/imdevimab.  Has a (+) direct SARS-CoV-2 viral test result  Has mild or moderate COVID-19   Is NOT hospitalized due to COVID-19  Is within 10 days of symptom onset  Has at least one of the high risk factor(s) for progression to severe COVID-19 and/or hospitalization as defined in EUA.  Specific high risk criteria : Older age (>/= 83 yo), BMI > 25, Cardiovascular disease or hypertension and Chronic Lung Disease   I have spoken and communicated the following to the patient or parent/caregiver regarding COVID monoclonal antibody treatment:  1. FDA has authorized the emergency use for the treatment of mild to moderate COVID-19 in adults and pediatric patients with positive results of direct SARS-CoV-2 viral testing who are 89 years of age and older weighing at least 40 kg, and who are at high risk for progressing to severe COVID-19 and/or hospitalization.  2. The significant known and potential risks and benefits of COVID monoclonal antibody, and the extent to which such potential risks and benefits are unknown.  3. Information on available alternative treatments and the risks and benefits of those alternatives, including clinical trials.  4. Patients treated with COVID monoclonal antibody should continue to self-isolate and use infection control measures (e.g., wear mask, isolate, social distance, avoid sharing personal items, clean and disinfect "high touch" surfaces, and frequent handwashing) according to CDC guidelines.   5. The patient or parent/caregiver has the option to accept or refuse COVID monoclonal antibody treatment.  After reviewing this information with the  patient, The patient agreed to proceed with receiving casirivimab\imdevimab infusion and will be provided a copy of the Fact sheet prior to receiving the infusion.  Sx onset 04/25/20. Set up for infusion on Saturday 04/28/20 at 8:30 am. Directions given to [facility]. Pt is aware that insurance will be charged an infusion fee.   [name] 04/27/2020 1:31 PM

## 2020-04-27 NOTE — ED Provider Notes (Signed)
[facility] EMERGENCY DEPARTMENT Provider Note   CSN: [id number] Arrival date & time: 04/27/20  0857     History Chief Complaint  Patient presents with  . Chest Pain  . Cough    Patrick Hatfield is a 85 y.o. male.  85 yo M with a chief complaints of back pain.  Patient has had a cough off and on for the past couple days.  Describes the cough is mild however he developed some severe back pain that was completely resolved with Tylenol.  Has happened to him now twice since yesterday.  Currently has no pain.  Points to the CVA bilaterally as the area where the pain was.  Denies fevers.  Denies shortness of breath.  Called his family doctor who is out of the office but suggested he either come to the ED or try to make an appointment with one of her partners.  The history is provided by the patient.  Chest Pain Associated symptoms: back pain and cough   Associated symptoms: no abdominal pain, no fever, no headache, no palpitations, no shortness of breath and no vomiting   Cough Associated symptoms: no chest pain, no chills, no eye discharge, no fever, no headaches, no myalgias, no rash and no shortness of breath   Illness Severity:  Moderate Onset quality:  Gradual Duration:  2 days Timing:  Constant Progression:  Unchanged Chronicity:  New Associated symptoms: cough   Associated symptoms: no abdominal pain, no chest pain, no congestion, no diarrhea, no fever, no headaches, no myalgias, no rash, no shortness of breath and no vomiting        Past Medical History:  Diagnosis Date  . Anxiety   . Arthritis    KNEES,ANKLES,HANDS  . Asthma    AS A TEENAGER  . Barrett's esophagus   . Benign essential tremor   . Clotting disorder ([place])    FACTOR 5  . Colitis in 20's  . Colon polyps   . Congenital deficiency of other clotting factors    factor 5  . Depression 2006   drug overdose  . Diverticulosis   . DVT (deep venous thrombosis) ([place]) 1991 & 2010   X 2 in context  Factor 5 def  . Esophageal reflux   . Factor 5 Leiden mutation, heterozygous ([place])   . Gilbert's syndrome   . H/O ischemic bowel disease 2010   while off warfarin   . Herpes zoster 2010   facial  . Hiatal hernia   . Hx of adenomatous colonic polyps 12/14/2014  . Hyperlipidemia   . Hypertension   . Insomnia   . Osteoporosis   . Personal history of colonic polyps 05/28/2007   adenomatous  . Proteinuria age 19  . 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 Umbilical hernia     Patient Active Problem List   Diagnosis Date Noted  . [name] diarrhea 12/28/2019  . Hearing loss 01/26/2018  . Long term (current) use of anticoagulants 05/15/2017  . Encounter for therapeutic drug monitoring 12/05/2016  . Insomnia 09/05/2015  . Diarrhea 09/05/2015  . Hx of adenomatous colonic polyps 12/14/2014  . Hematuria, gross 02/03/2014  . Thrombosis of mesenteric vein (HCC) 07/12/2013  . Thrombocytopenia, unspecified ([place]) 01/05/2013  . Gilbert syndrome 01/05/2013  . Unspecified adverse effect of unspecified drug, medicinal and biological substance 01/09/2012  . Barrett's esophagus 03/24/2011  . Factor 5 Leiden mutation, heterozygous ([place]) 03/21/2011  . Essential tremor 04/24/2009  . HYPERLIPIDEMIA 10/03/2008  .  Essential hypertension 10/03/2008  . GERD 10/03/2008  . Fasting hyperglycemia 10/03/2008  . DVT, HX OF 10/03/2008    Past Surgical History:  Procedure Laterality Date  . CHOLECYSTECTOMY  05/16/2011    Dr Marlou Starks  . COLONOSCOPY     Dr Sharlett Iles  . CYSTOSCOPY     Neg  . EYE SURGERY Bilateral 10/16/2017   secondary catarct surgery.  Marland Kitchen KNEE ARTHROSCOPY Bilateral 2008, 2012   Dr.Aplington bilateral  . TONSILLECTOMY    . TRANSURETHRAL RESECTION OF PROSTATE      BPH;Dr Dahlstedt       Family History  Problem Relation Age of Onset  . Pancreatic cancer Father 45  . Ulcers Father   . Stroke Father 51  . Diabetes Father   . Coronary artery disease Mother    . Osteoporosis Mother   . HIV Brother   . Multiple sclerosis Sister   . Heart attack Paternal Grandfather        early 80s  . Heart attack Paternal Uncle        2 uncles in early 49s  . Aortic aneurysm Brother   . Colon cancer Neg Hx   . Esophageal cancer Neg Hx   . Stomach cancer Neg Hx     Social History   Tobacco Use  . Smoking status: Former Smoker    Packs/day: 2.00    Years: 40.00    Pack years: 80.00    Types: Cigarettes    Quit date: 08/18/1994    Years since quitting: 25.7  . Smokeless tobacco: Never Used  . Tobacco comment: smoked 1956-1996, up to 2 ppd  Vaping Use  . Vaping Use: Never used  Substance Use Topics  . Alcohol use: Not Currently  . Drug use: No    Home Medications Prior to Admission medications   Medication Sig Start Date End Date Taking? Authorizing Provider  acetaminophen (TYLENOL) 325 MG tablet Per bottle as needed    [provider]  Al Hyd-Mg Tr-Alg Ac-Sod Bicarb (GAVISCON-2 PO) Take by mouth.    [provider]  benzonatate (TESSALON) 100 MG capsule Take 1 capsule (100 mg total) by mouth every 8 (eight) hours. 04/27/20   Deno Etienne, DO  cholecalciferol (VITAMIN D) 1000 UNITS tablet Take 1,000 Units by mouth daily.      [provider]  diazepam (VALIUM) 5 MG tablet TAKE 1/2 TO 1 TABLET BY MOUTH AT BEDTIME AS NEEDED FOR SLEEP 02/03/20   Copland, Gay Filler, MD  famotidine (PEPCID) 20 MG tablet Take 1 tablet (20 mg total) by mouth 2 (two) times daily. 04/25/20   Noralyn Pick, NP  Fluticasone Propionate (FLONASE NA) Place into the nose as needed.    [provider]  folic acid (FOLVITE) 914 MCG tablet Take 400 mcg by mouth daily.      [provider]  gabapentin (NEURONTIN) 300 MG capsule TAKE 1 OR 2 CAPSULES BY MOUTH AT BEDTIME AS NEEDED 04/19/20   Copland, Gay Filler, MD  losartan-hydrochlorothiazide (HYZAAR) 100-12.5 MG tablet Take 1 tablet by mouth daily. 06/14/19   Copland, Gay Filler, MD   lovastatin (MEVACOR) 10 MG tablet TAKE 1 TABLET BY MOUTH EVERYDAY AT BEDTIME 09/26/19   Copland, Gay Filler, MD  Omega-3 Fatty Acids (FISH OIL) 1200 MG CAPS Take 2,400 mg by mouth daily.     [provider]  propranolol (INDERAL) 40 MG tablet TAKE 1 TABLET BY MOUTH TWICE A DAY 03/19/20   Copland, Gay Filler, MD  tamsulosin (  FLOMAX) 0.4 MG CAPS capsule TAKE 1 CAPSULE (0.4 MG TOTAL) BY MOUTH DAILY. 03/19/20   Copland, Gay Filler, MD  vancomycin (VANCOCIN HCL) 125 MG capsule Vancomycin 125 mg 1 p.o. 3 times daily for 7 days then,  Vancomycin 125 mg 1 p.o. twice daily for 7 days then,  Vancomycin 125 mg 1 p.o. daily for 7 days then,  Vancomycin 125 mg 1 p.o. every third day for 6 weeks # 56, no refills. 12/28/19   Noralyn Pick, NP  warfarin (COUMADIN) 5 MG tablet TAKE AS DIRECTED BY ANTICOAGULATION CLINIC 04/09/20   Copland, Gay Filler, MD    Allergies    Amlodipine besy-benazepril hcl and Lipitor [atorvastatin calcium]  Review of Systems   Review of Systems  Constitutional: Negative for chills and fever.  HENT: Negative for congestion and facial swelling.   Eyes: Negative for discharge and visual disturbance.  Respiratory: Positive for cough. Negative for shortness of breath.   Cardiovascular: Negative for chest pain and palpitations.  Gastrointestinal: Negative for abdominal pain, diarrhea and vomiting.  Musculoskeletal: Positive for back pain. Negative for arthralgias and myalgias.  Skin: Negative for color change and rash.  Neurological: Negative for tremors, syncope and headaches.  Psychiatric/Behavioral: Negative for confusion and dysphoric mood.    Physical Exam Updated Vital Signs BP 126/77 (BP Location: Right Arm)   Pulse 66   Temp 97.7 F (36.5 C) (Oral)   Resp 16   Ht 5\' 10"  (1.778 m)   Wt 93 kg   SpO2 97%   BMI 29.41 kg/m   Physical Exam Vitals and nursing note reviewed.  Constitutional:      Appearance: He is well-developed.  HENT:     Head:  Normocephalic and atraumatic.  Eyes:     Pupils: Pupils are equal, round, and reactive to light.  Neck:     Vascular: No JVD.  Cardiovascular:     Rate and Rhythm: Normal rate and regular rhythm.     Heart sounds: No murmur heard.  No friction rub. No gallop.   Pulmonary:     Effort: No respiratory distress.     Breath sounds: No wheezing.  Chest:     Chest wall: No tenderness.  Abdominal:     General: There is no distension.     Tenderness: There is no guarding or rebound.  Musculoskeletal:        General: Normal range of motion.     Cervical back: Normal range of motion and neck supple.     Comments: No pain with palpation to the area where he had pain previously no midline spinal tenderness.  No CVA tenderness to percussion.  Skin:    Coloration: Skin is not pale.     Findings: No rash.  Neurological:     Mental Status: He is alert and oriented to person, place, and time.  Psychiatric:        Behavior: Behavior normal.     ED Results / Procedures / Treatments   Labs (all labs ordered are listed, but only abnormal results are displayed) Labs Reviewed  SARS CORONAVIRUS 2 BY RT PCR (HOSPITAL ORDER, [facility]) - Abnormal; Notable for the following components:      Result Value   SARS Coronavirus 2 POSITIVE (*)    All other components within normal limits  URINALYSIS, ROUTINE W REFLEX MICROSCOPIC - Abnormal; Notable for the following components:   Bilirubin Urine SMALL (*)    All other components within normal  limits    EKG EKG Interpretation  Date/Time:  Friday April 27 2020 09:18:08 EDT Ventricular Rate:  65 PR Interval:    QRS Duration: 92 QT Interval:  401 QTC Calculation: 417 R Axis:   56 Text Interpretation: Age not entered, assumed to be  85 years old for purpose of ECG interpretation Sinus rhythm ST elev, probable normal early repol pattern No significant change since last tracing Confirmed by Deno Etienne [phone]) on 04/27/2020  9:52:13 AM   Radiology DG Chest Port 1 View  Result Date: 04/27/2020 CLINICAL DATA:  Back pain, cough EXAM: PORTABLE CHEST 1 VIEW COMPARISON:  11/28/2015 FINDINGS: The heart size and mediastinal contours are within normal limits. Both lungs are clear. The visualized skeletal structures are unremarkable. IMPRESSION: No acute abnormality of the lungs in AP portable projection. Electronically Signed   By: Eddie Candle M.D.   On: 04/27/2020 09:45    Procedures Procedures (including critical care time)  Medications Ordered in ED Medications - No data to display  ED Course  I have reviewed the triage vital signs and the nursing notes.  Pertinent labs & imaging results that were available during my care of the patient were reviewed by me and considered in my medical decision making (see chart for details).    MDM Rules/Calculators/A&P                          85 yo M with a chief complaints of flank pain.  This is been bilateral and has lasted for a few hours and resolved with Tylenol.  Points to his CVAs bilaterally.  He is well-appearing and nontoxic.  Seems less likely to be a bilateral kidney infection.  He has clear lung sounds for me.  Chest x-ray viewed by me without focal infiltrate or pneumothorax.  Will obtain a UA.  Patient is currently undergoing vancomycin therapy for [name].  With need to high threshold to start him on antibiotic therapy at this point.  Likely viral source of his cough.  During during the novel coronavirus pandemic will obtain a Covid test.  The patient's Covid test came back as he was being discharged.  It is positive.  Message sent to the monoclonal antibody team for possible outpatient infusion.  Patrick Hatfield was evaluated in Emergency Department on 04/27/2020 for the symptoms described in the history of present illness. He/she was evaluated in the context of the global COVID-19 pandemic, which necessitated consideration that the patient might be at risk  for infection with the SARS-CoV-2 virus that causes COVID-19. Institutional protocols and algorithms that pertain to the evaluation of patients at risk for COVID-19 are in a state of rapid change based on information released by regulatory bodies including the CDC and federal and state organizations. These policies and algorithms were followed during the patient's care in the ED.   11:20 AM:  I have discussed the diagnosis/risks/treatment options with the patient and believe the pt to be eligible for discharge home to follow-up with PCP. We also discussed returning to the ED immediately if new or worsening sx occur. We discussed the sx which are most concerning (e.g., sudden worsening pain, fever, inability to tolerate by mouth) that necessitate immediate return. Medications administered to the patient during their visit and any new prescriptions provided to the patient are listed below.  Medications given during this visit Medications - No data to display   The patient appears reasonably screen and/or stabilized for  discharge and I doubt any other medical condition or other [facility] requiring further screening, evaluation, or treatment in the ED at this time prior to discharge.     Final Clinical Impression(s) / ED Diagnoses Final diagnoses:  Viral upper respiratory tract infection  Pneumonia due to COVID-19 virus    Rx / DC Orders ED Discharge Orders         Ordered    benzonatate (TESSALON) 100 MG capsule  Every 8 hours        04/27/20 [place], Elijah Phommachanh, DO 04/27/20 1120

## 2020-04-27 NOTE — ED Notes (Signed)
ED Provider at bedside. 

## 2020-04-27 NOTE — ED Triage Notes (Signed)
Pt reports bilateral ribs pain and cough, today pain radiating to back. Took 1 gr tylenol 7 am today, pain is better . Denies shortness of breath. ambulated to room with pulse ox 95 RA

## 2020-04-28 ENCOUNTER — Encounter: Payer: Self-pay | Admitting: Family Medicine

## 2020-04-28 ENCOUNTER — Ambulatory Visit (HOSPITAL_COMMUNITY)
Admission: RE | Admit: 2020-04-28 | Discharge: 2020-04-28 | Disposition: A | Discharge status: Home / Self Care | Payer: Medicare Other | Source: Ambulatory Visit | Attending: Pulmonary Disease | Admitting: Pulmonary Disease

## 2020-04-28 DIAGNOSIS — I10 Essential (primary) hypertension: Secondary | ICD-10-CM | POA: Diagnosis not present

## 2020-04-28 DIAGNOSIS — Z23 Encounter for immunization: Secondary | ICD-10-CM | POA: Insufficient documentation

## 2020-04-28 DIAGNOSIS — Z6827 Body mass index (BMI) 27.0-27.9, adult: Secondary | ICD-10-CM

## 2020-04-28 DIAGNOSIS — Z8709 Personal history of other diseases of the respiratory system: Secondary | ICD-10-CM | POA: Diagnosis not present

## 2020-04-28 DIAGNOSIS — U071 COVID-19: Secondary | ICD-10-CM | POA: Diagnosis not present

## 2020-04-28 MED ORDER — METHYLPREDNISOLONE SODIUM SUCC 125 MG IJ SOLR: 125.0000 mg | Freq: Once | INTRAMUSCULAR | Status: DC | PRN

## 2020-04-28 MED ORDER — SODIUM CHLORIDE 0.9 % IV SOLN: INTRAVENOUS | Status: DC | PRN

## 2020-04-28 MED ORDER — FAMOTIDINE IN NACL 20-0.9 MG/50ML-% IV SOLN: 20.0000 mg | Freq: Once | INTRAVENOUS | Status: DC | PRN

## 2020-04-28 MED ORDER — DIPHENHYDRAMINE HCL 50 MG/ML IJ SOLN: 50.0000 mg | Freq: Once | INTRAMUSCULAR | Status: DC | PRN

## 2020-04-28 MED ORDER — SODIUM CHLORIDE 0.9 % IV SOLN
1200.0000 mg | Freq: Once | INTRAVENOUS | Status: AC
  Administered 2020-04-28: 1200 mg via INTRAVENOUS
  Filled 2020-04-28: qty 10

## 2020-04-28 MED ORDER — EPINEPHRINE 0.3 MG/0.3ML IJ SOAJ: 0.3000 mg | Freq: Once | INTRAMUSCULAR | Status: DC | PRN

## 2020-04-28 MED ORDER — ALBUTEROL SULFATE HFA 108 (90 BASE) MCG/ACT IN AERS: 2.0000 | INHALATION_SPRAY | Freq: Once | RESPIRATORY_TRACT | Status: DC | PRN

## 2020-04-28 NOTE — Progress Notes (Signed)
  Diagnosis: COVID-19  Physician:Dr Joya Gaskins   Procedure: [facility] Infusion [facility] Med: casirivimab\imdevimab infusion - Provided patient with casirivimab\imdevimab fact sheet for patients, parents and caregivers prior to infusion.  Complications: No immediate complications noted.  Discharge: Discharged home   Dewaine Oats 04/28/2020

## 2020-04-28 NOTE — Discharge Instructions (Signed)

## 2020-05-01 ENCOUNTER — Encounter: Payer: Self-pay | Admitting: Family Medicine

## 2020-05-08 ENCOUNTER — Encounter: Payer: Self-pay | Admitting: Family Medicine

## 2020-05-12 ENCOUNTER — Other Ambulatory Visit: Payer: Self-pay | Admitting: Nurse Practitioner

## 2020-05-17 ENCOUNTER — Ambulatory Visit (INDEPENDENT_AMBULATORY_CARE_PROVIDER_SITE_OTHER): Payer: Medicare Other | Admitting: General Practice

## 2020-05-17 ENCOUNTER — Other Ambulatory Visit: Payer: Self-pay

## 2020-05-17 DIAGNOSIS — Z23 Encounter for immunization: Secondary | ICD-10-CM

## 2020-05-17 DIAGNOSIS — Z7901 Long term (current) use of anticoagulants: Secondary | ICD-10-CM

## 2020-05-17 DIAGNOSIS — Z86718 Personal history of other venous thrombosis and embolism: Secondary | ICD-10-CM

## 2020-05-17 LAB — POCT INR: INR: 2.6 (ref 2.0–3.0)

## 2020-05-17 NOTE — Addendum Note (Signed)
Addended by: Meriam Sprague D on: 05/17/2020 10:55 AM   Modules accepted: Orders

## 2020-05-17 NOTE — Addendum Note (Signed)
Addended by: Meriam Sprague D on: 05/17/2020 11:11 AM   Modules accepted: Orders

## 2020-05-17 NOTE — Addendum Note (Signed)
Addended by: Meriam Sprague D on: 05/17/2020 11:06 AM   Modules accepted: Orders

## 2020-05-17 NOTE — Progress Notes (Signed)
Patient ID: Patrick Hatfield, male   DOB: 1935/06/20, 85 y.o.   MRN: [id number] Medical screening examination/treatment/procedure(s) were performed by non-physician practitioner and as supervising physician I was immediately available for consultation/collaboration. I agree with above. Cathlean Cower, MD

## 2020-05-17 NOTE — Addendum Note (Signed)
Addended by: Meriam Sprague D on: 05/17/2020 11:25 AM   Modules accepted: Orders

## 2020-05-17 NOTE — Addendum Note (Signed)
Addended by: Meriam Sprague D on: 05/17/2020 11:41 AM   Modules accepted: Orders

## 2020-05-17 NOTE — Addendum Note (Signed)
Addended by: Meriam Sprague D on: 05/17/2020 11:13 AM   Modules accepted: Orders

## 2020-05-17 NOTE — Addendum Note (Signed)
Addended by: Meriam Sprague D on: 05/17/2020 11:20 AM   Modules accepted: Orders

## 2020-05-17 NOTE — Patient Instructions (Signed)
Pre visit review using our clinic review tool, if applicable. No additional management support is needed unless otherwise documented below in the visit note.  Continue to take 1/2 tablet all days except 1 tablet on Tuesday and Saturdays.  Re-check in 6 weeks.

## 2020-05-17 NOTE — Progress Notes (Signed)
i

## 2020-06-17 ENCOUNTER — Encounter: Payer: Self-pay | Admitting: Family Medicine

## 2020-06-18 DIAGNOSIS — H10412 Chronic giant papillary conjunctivitis, left eye: Secondary | ICD-10-CM | POA: Diagnosis not present

## 2020-06-22 ENCOUNTER — Telehealth: Payer: Self-pay | Admitting: Pharmacist

## 2020-06-22 NOTE — Progress Notes (Addendum)
Chronic Care Management Pharmacy Assistant   Name: Patrick Hatfield  MRN: [id number] DOB: June 13, 1935  Reason for Encounter: Medication Review/ General Adherence  Patient Questions:  1.  Have you seen any other providers since your last visit? Yes  2.  Any changes in your medicines or health? No  PCP : Copland, Gay Filler, MD   Consults: 05-17-20([facility]) Patient presented in office with Meriam Sprague for anti-coag visit.  Nurse recommended patient to take half tablet all days except Tuesday and Saturdays.  04-20-20(Gastroenterology) Patient presented with mychart message with Carl Best.  Provider changed Famotidine 20 mg medication to twice daily. 04-05-20([facility]) Patient presented in office with Meriam Sprague for anti-coag visit.  Nurse recommended patient to take half tablet all days except Tuesday and Saturdays.   Hospitalizations: 04-28-20 [facility]) Patient presented in office with last attending team for Regen-cov IVPB  CPP Please Review 04-27-20 [facility] Emergency Department) Patient was presented with last attending team Deno Etienne for chest pain and a cough.  Provider prescribed Benzonatate 100 mg  Allergies:   Allergies  Allergen Reactions  . Amlodipine Besy-Benazepril Hcl     REACTION: swelling of feet.  Probably  from Amlodipine component)  . Lipitor [Atorvastatin Calcium]     ? Reaction, ? Swelling=- update 7/20.  Pt recalls his legs hurt and perhaps swelled slightly - JC    Medications: Outpatient Encounter Medications as of 06/22/2020  Medication Sig Note  . acetaminophen (TYLENOL) 325 MG tablet Per bottle as needed 03/30/2020: As needed  . Al Hyd-Mg Tr-Alg Ac-Sod Bicarb (GAVISCON-2 PO) Take by mouth. 03/30/2020: As needed  . benzonatate (TESSALON) 100 MG capsule Take 1 capsule (100 mg total) by mouth every 8 (eight) hours.   . cholecalciferol (VITAMIN D) 1000 UNITS tablet Take 1,000 Units by mouth daily.     . diazepam (VALIUM) 5 MG  tablet TAKE 1/2 TO 1 TABLET BY MOUTH AT BEDTIME AS NEEDED FOR SLEEP 03/30/2020: As needed  . famotidine (PEPCID) 20 MG tablet TAKE 1 TABLET BY MOUTH EVERY DAY   . Fluticasone Propionate (FLONASE NA) Place into the nose as needed. 03/30/2020: As needed  . folic acid (FOLVITE) 191 MCG tablet Take 400 mcg by mouth daily.     Marland Kitchen gabapentin (NEURONTIN) 300 MG capsule TAKE 1 OR 2 CAPSULES BY MOUTH AT BEDTIME AS NEEDED   . losartan-hydrochlorothiazide (HYZAAR) 100-12.5 MG tablet Take 1 tablet by mouth daily.   Marland Kitchen lovastatin (MEVACOR) 10 MG tablet TAKE 1 TABLET BY MOUTH EVERYDAY AT BEDTIME   . Omega-3 Fatty Acids (FISH OIL) 1200 MG CAPS Take 2,400 mg by mouth daily.    . propranolol (INDERAL) 40 MG tablet TAKE 1 TABLET BY MOUTH TWICE A DAY 03/30/2020: Take #2 daily  . tamsulosin (FLOMAX) 0.4 MG CAPS capsule TAKE 1 CAPSULE (0.4 MG TOTAL) BY MOUTH DAILY.   . vancomycin (VANCOCIN HCL) 125 MG capsule Vancomycin 125 mg 1 p.o. 3 times daily for 7 days then,  Vancomycin 125 mg 1 p.o. twice daily for 7 days then,  Vancomycin 125 mg 1 p.o. daily for 7 days then,  Vancomycin 125 mg 1 p.o. every third day for 6 weeks # 56, no refills. 03/30/2020: Now using every 3rd day  . warfarin (COUMADIN) 5 MG tablet TAKE AS DIRECTED BY ANTICOAGULATION CLINIC    No facility-administered encounter medications on file as of 06/22/2020.    Current Diagnosis: Patient Active Problem List   Diagnosis Date Noted  . C.  difficile diarrhea 12/28/2019  . Hearing loss 01/26/2018  . Long term (current) use of anticoagulants 05/15/2017  . Encounter for therapeutic drug monitoring 12/05/2016  . Insomnia 09/05/2015  . Diarrhea 09/05/2015  . Hx of adenomatous colonic polyps 12/14/2014  . Hematuria, gross 02/03/2014  . Thrombosis of mesenteric vein (HCC) 07/12/2013  . Thrombocytopenia, unspecified ([place]) 01/05/2013  . Gilbert syndrome 01/05/2013  . Unspecified adverse effect of unspecified drug, medicinal and biological substance 01/09/2012   . Barrett's esophagus 03/24/2011  . Factor 5 Leiden mutation, heterozygous ([place]) 03/21/2011  . Essential tremor 04/24/2009  . HYPERLIPIDEMIA 10/03/2008  . Essential hypertension 10/03/2008  . GERD 10/03/2008  . Fasting hyperglycemia 10/03/2008  . DVT, HX OF 10/03/2008    Goals Addressed   None    Called patient and discussed medications, no problems at this time  Patient denies any recent ED visits since last CCM visit. Patient denies any side effects with current medications. Patient denies any problems with current pharmacy.  [facility]  Advised pateint of 2/15 appointment  Follow-Up:  Pharmacist Review   [place], [facility] [place] Pharmacist Assistant [phone]  Reviewed by: [name], PharmD Clinical Pharmacist [facility] [phone]

## 2020-06-28 ENCOUNTER — Ambulatory Visit (INDEPENDENT_AMBULATORY_CARE_PROVIDER_SITE_OTHER): Payer: Medicare Other | Admitting: General Practice

## 2020-06-28 ENCOUNTER — Other Ambulatory Visit: Payer: Self-pay

## 2020-06-28 DIAGNOSIS — Z7901 Long term (current) use of anticoagulants: Secondary | ICD-10-CM | POA: Diagnosis not present

## 2020-06-28 DIAGNOSIS — Z86718 Personal history of other venous thrombosis and embolism: Secondary | ICD-10-CM

## 2020-06-28 LAB — POCT INR: INR: 2.4 (ref 2.0–3.0)

## 2020-06-28 NOTE — Patient Instructions (Signed)
Pre visit review using our clinic review tool, if applicable. No additional management support is needed unless otherwise documented below in the visit note.  Continue to take 1/2 tablet all days except 1 tablet on Tuesday and Saturdays.  Re-check in 6 weeks.

## 2020-07-08 ENCOUNTER — Other Ambulatory Visit: Payer: Self-pay | Admitting: Family Medicine

## 2020-07-08 DIAGNOSIS — I10 Essential (primary) hypertension: Secondary | ICD-10-CM

## 2020-07-17 ENCOUNTER — Other Ambulatory Visit: Payer: Self-pay | Admitting: Family Medicine

## 2020-07-17 DIAGNOSIS — M62838 Other muscle spasm: Secondary | ICD-10-CM

## 2020-07-17 NOTE — Telephone Encounter (Signed)
Requesting: diazepam 5mg  Contract: None  UDS: None Last Visit: 01/23/2020 Next Visit: 07/26/2020  Last Refill: 02/03/2020 #30 and 2RF Pt sig: 1/2 to 1 tab qhs prn  Please Advise

## 2020-07-18 ENCOUNTER — Encounter: Payer: Self-pay | Admitting: Family Medicine

## 2020-07-25 NOTE — Progress Notes (Addendum)
Patrick Hatfield [address], [place], [place] [address] [phone]  Date:  07/26/2020   Name:  Patrick Hatfield   DOB:  Feb 14, 1935   MRN:  [id number]  PCP:  Darreld Mclean, MD    Chief Complaint: Hypertension (6 month follow up/)   History of Present Illness:  Patrick Hatfield is a 85 y.o. very pleasant male patient who presents with the following:  Patient here today for 47-month follow-up visit Last seen by myself in June of this year Patient with history of factor V Leiden mutation on Coumadin, hypertension, GERD/Barrett's esophagus,Gilbertsyndrome, hyperlipidemia, [name] diarrhea following antibiotics, essential/benign familial tremor Married to Patrick Hatfield, they have 4 daughters and 6 grandchildren. They are all college age or older  His coumadin is managed by cardiology anticoagulation clinic- no bleeding except for routine bruising  Flu shot done  He was seen in the ER back in September with cough-ended up being diagnosed with COVID-19, he received antibody infusion on September 11 He notes that he has recovered fully He is getting his covid booster tomorrow - 90 days after antibody infusion   We discussed his medications.  He is actually taking all 40 mg of immediate release propranolol once a day; he uses this to manage his tremor.  He still has some tremor symptoms, but it is reduced with propranolol.  Since he is currently taking this medication once a day we decided to try changing to the extended release formula.  Otherwise medications are unchanged He walks at least once, sometimes twice a day for exercise.  Overall, Patrick Hatfield notes that he is blessed with good health and has greatly outlived most of the men in his family   Patient Active Problem List   Diagnosis Date Noted   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[place]) 07/12/2013   Thrombocytopenia, unspecified ([place]) 01/05/2013   Rosanna Randy syndrome 01/05/2013   Unspecified adverse effect of unspecified drug, medicinal and biological substance 01/09/2012   Barrett's esophagus 03/24/2011   Factor 5 Leiden mutation, heterozygous ([place]) 03/21/2011   Essential tremor 04/24/2009   HYPERLIPIDEMIA 10/03/2008   Essential hypertension 10/03/2008   GERD 10/03/2008   Fasting hyperglycemia 10/03/2008   DVT, HX OF 10/03/2008    Past Medical History:  Diagnosis Date   Anxiety    Arthritis    KNEES,ANKLES,HANDS   Asthma    AS A TEENAGER   Barrett's esophagus    Benign essential tremor    Clotting disorder ([place])    FACTOR 5   Colitis in 20's   Colon polyps    Congenital deficiency of other clotting factors    factor 5   Depression 2006   drug overdose   Diverticulosis    DVT (deep venous thrombosis) ([place]) 1991 & 2010   X 2 in context Factor 5 def   Esophageal reflux    Factor 5 Leiden mutation, heterozygous ([place])    Gilbert's syndrome    H/O ischemic bowel disease 2010   while off warfarin    Herpes zoster 2010   facial   Hiatal hernia    Hx of adenomatous colonic polyps 12/14/2014   Hyperlipidemia  Hypertension    Insomnia    Osteoporosis    Personal history of colonic polyps 05/28/2007   adenomatous   Proteinuria age 22   Thrombosis of mesenteric vein ([place]) 71/01/2693   03/5461   Umbilical hernia     Past Surgical History:  Procedure Laterality Date   CHOLECYSTECTOMY  05/16/2011    Dr Marlou Starks   COLONOSCOPY     Dr Sharlett Iles   CYSTOSCOPY     Neg   EYE SURGERY Bilateral 10/16/2017   secondary catarct surgery.   KNEE ARTHROSCOPY Bilateral 2008, 2012   Dr.Aplington bilateral   TONSILLECTOMY      TRANSURETHRAL RESECTION OF PROSTATE      B[facility];Dr Dahlstedt    Social History   Tobacco Use   Smoking status: Former Smoker    Packs/day: 2.00    Years: 40.00    Pack years: 80.00    Types: Cigarettes    Quit date: 08/18/1994    Years since quitting: 25.9   Smokeless tobacco: Never Used   Tobacco comment: smoked 1956-1996, up to 2 ppd  Vaping Use   Vaping Use: Never used  Substance Use Topics   Alcohol use: Not Currently   Drug use: No    Family History  Problem Relation Age of Onset   Pancreatic cancer Father 6   Ulcers Father    Stroke Father 13   Diabetes Father    Coronary artery disease Mother    Osteoporosis Mother    HIV Brother    Multiple sclerosis Sister    Heart attack Paternal Grandfather        early 41s   Heart attack Paternal Uncle        2 uncles in early 34s   Aortic aneurysm Brother    Colon cancer Neg Hx    Esophageal cancer Neg Hx    Stomach cancer Neg Hx     Allergies  Allergen Reactions   Amlodipine Besy-Benazepril Hcl     REACTION: swelling of feet.  Probably  from Amlodipine component)   Lipitor [Atorvastatin Calcium]     ? Reaction, ? Swelling=- update 7/20.  Pt recalls his legs hurt and perhaps swelled slightly - JC    Medication list has been reviewed and updated.  Current Outpatient Medications on File Prior to Visit  Medication Sig Dispense Refill   acetaminophen (TYLENOL) 325 MG tablet Per bottle as needed     Al Hyd-Mg Tr-Alg Ac-Sod Bicarb (GAVISCON-2 PO) Take by mouth.     cholecalciferol (VITAMIN D) 1000 UNITS tablet Take 1,000 Units by mouth daily.     diazepam (VALIUM) 5 MG tablet TAKE 1/2 TO 1 TABLET BY MOUTH AT BEDTIME AS NEEDED FOR SLEEP 30 tablet 2   famotidine (PEPCID) 20 MG tablet TAKE 1 TABLET BY MOUTH EVERY DAY 30 tablet 1   Fluticasone Propionate (FLONASE NA) Place into the nose as needed.     folic acid (FOLVITE) 703 MCG tablet Take 400 mcg by mouth daily.     gabapentin  (NEURONTIN) 300 MG capsule TAKE 1 OR 2 CAPSULES BY MOUTH AT BEDTIME AS NEEDED 180 capsule 1   losartan-hydrochlorothiazide (HYZAAR) 100-12.5 MG tablet Take 1 tablet by mouth daily. 90 tablet 0   lovastatin (MEVACOR) 10 MG tablet TAKE 1 TABLET BY MOUTH EVERYDAY AT BEDTIME 90 tablet 3   Omega-3 Fatty Acids (FISH OIL) 1200 MG CAPS Take 2,400 mg by mouth daily.     propranolol (INDERAL) 40 MG tablet TAKE 1 TABLET BY MOUTH  TWICE A DAY 180 tablet 3   warfarin (COUMADIN) 5 MG tablet TAKE AS DIRECTED BY ANTICOAGULATION CLINIC 120 tablet 3   No current facility-administered medications on file prior to visit.    Review of Systems:  As per HPI- otherwise negative.   Physical Examination: Vitals:   07/26/20 1105  BP: 128/78  Pulse: 65  Resp: 18  SpO2: 94%   Vitals:   07/26/20 1105  Weight: 211 lb (95.7 kg)  Height: 5\' 10"  (1.778 m)   Body mass index is 30.28 kg/m. Ideal Body Weight: Weight in (lb) to have BMI = 25: 173.9  GEN: no acute distress.  Overweight, otherwise looks well He has a fine tremor most obvious in his head and neck HEENT: Atraumatic, Normocephalic.  Ears and Nose: No external deformity. CV: RRR, No M/G/R. No JVD. No thrill. No extra heart sounds. PULM: CTA B, no wheezes, crackles, rhonchi. No retractions. No resp. distress. No accessory muscle use. EXTR: No c/c/e PSYCH: Normally interactive. Conversant.   Pulse Readings from Last 3 Encounters:  07/26/20 65  04/28/20 (!) 50  04/27/20 66   BP Readings from Last 3 Encounters:  07/26/20 128/78  04/28/20 122/68  04/27/20 126/77    Assessment and Plan: Essential hypertension - Plan: CBC, Comprehensive metabolic panel  Dyslipidemia - Plan: Lipid panel  Essential tremor - Plan: propranolol ER (INDERAL LA) 80 MG 24 hr capsule  Patient here today for follow-up.  Blood pressures well controlled, we changed his propranolol to extended release 80 mg once a day-he is currently taking 80 mg of immediate release  once a day He does have a blood pressure and pulse meter.  I have asked him to please monitor his blood pressure and pulse after this change, and let me know if any concerns or bradycardia He saw neurology years ago regarding his tremor.  He notes a strong family history of this sort of tremor, he is able to manage it and does not wish to look further at this time  Labs are pending as above Went over immunizations and health maintenance Will plan further follow- up pending labs. Plan to visit in 6 months This visit occurred during the SARS-CoV-2 public health emergency.  Safety protocols were in place, including screening questions prior to the visit, additional usage of staff PPE, and extensive cleaning of exam room while observing appropriate contact time as indicated for disinfecting solutions.    Signed Lamar Blinks, MD  Received his labs 12/11, message to patient  Results for orders placed or performed in visit on 07/26/20  CBC  Result Value Ref Range   WBC 6.4 4.0 - 10.5 K/uL   RBC 4.69 4.22 - 5.81 Mil/uL   Platelets 124.0 (L) 150.0 - 400.0 K/uL   Hemoglobin 15.1 13.0 - 17.0 g/dL   HCT 44.7 39.0 - 52.0 %   MCV 95.3 78.0 - 100.0 fl   MCHC 33.8 30.0 - 36.0 g/dL   RDW 14.0 11.5 - 15.5 %  Comprehensive metabolic panel  Result Value Ref Range   Sodium 140 135 - 145 mEq/L   Potassium 4.3 3.5 - 5.1 mEq/L   Chloride 102 96 - 112 mEq/L   CO2 32 19 - 32 mEq/L   Glucose, Bld 89 70 - 99 mg/dL   BUN 14 6 - 23 mg/dL   Creatinine, Ser 0.86 0.40 - 1.50 mg/dL   Total Bilirubin 2.3 (H) 0.2 - 1.2 mg/dL   Alkaline Phosphatase 49 39 - 117 U/L  AST 25 0 - 37 U/L   ALT 27 0 - 53 U/L   Total Protein 7.6 6.0 - 8.3 g/dL   Albumin 4.3 3.5 - 5.2 g/dL   GFR 79.92 >60.00 mL/min   Calcium 9.5 8.4 - 10.5 mg/dL  Lipid panel  Result Value Ref Range   Cholesterol 176 0 - 200 mg/dL   Triglycerides 267.0 (H) 0.0 - 149.0 mg/dL   HDL 36.10 (L) >39.00 mg/dL   VLDL 53.4 (H) 0.0 - 40.0 mg/dL    Total CHOL/HDL Ratio 5    NonHDL 140.19   LDL cholesterol, direct  Result Value Ref Range   Direct LDL 96.0 mg/dL

## 2020-07-26 ENCOUNTER — Encounter: Payer: Self-pay | Admitting: Family Medicine

## 2020-07-26 ENCOUNTER — Other Ambulatory Visit: Payer: Self-pay

## 2020-07-26 ENCOUNTER — Ambulatory Visit (INDEPENDENT_AMBULATORY_CARE_PROVIDER_SITE_OTHER): Payer: Medicare Other | Admitting: Family Medicine

## 2020-07-26 VITALS — BP 128/78 | HR 65 | Resp 18 | Ht 70.0 in | Wt 211.0 lb

## 2020-07-26 DIAGNOSIS — E785 Hyperlipidemia, unspecified: Secondary | ICD-10-CM | POA: Diagnosis not present

## 2020-07-26 DIAGNOSIS — I10 Essential (primary) hypertension: Secondary | ICD-10-CM | POA: Diagnosis not present

## 2020-07-26 DIAGNOSIS — G250 Essential tremor: Secondary | ICD-10-CM | POA: Diagnosis not present

## 2020-07-26 LAB — COMPREHENSIVE METABOLIC PANEL
ALT: 27 U/L (ref 0–53)
AST: 25 U/L (ref 0–37)
Albumin: 4.3 g/dL (ref 3.5–5.2)
Alkaline Phosphatase: 49 U/L (ref 39–117)
BUN: 14 mg/dL (ref 6–23)
CO2: 32 mEq/L (ref 19–32)
Calcium: 9.5 mg/dL (ref 8.4–10.5)
Chloride: 102 mEq/L (ref 96–112)
Creatinine, Ser: 0.86 mg/dL (ref 0.40–1.50)
GFR: 79.92 mL/min (ref >60.00)
Glucose, Bld: 89 mg/dL (ref 70–99)
Potassium: 4.3 mEq/L (ref 3.5–5.1)
Sodium: 140 mEq/L (ref 135–145)
Total Bilirubin: 2.3 mg/dL — ABNORMAL HIGH (ref 0.2–1.2)
Total Protein: 7.6 g/dL (ref 6.0–8.3)

## 2020-07-26 LAB — LIPID PANEL
Cholesterol: 176 mg/dL (ref 0–200)
HDL: 36.1 mg/dL — ABNORMAL LOW (ref >39.00)
NonHDL: 140.19
Total CHOL/HDL Ratio: 5
Triglycerides: 267 mg/dL — ABNORMAL HIGH (ref 0.0–149.0)
VLDL: 53.4 mg/dL — ABNORMAL HIGH (ref 0.0–40.0)

## 2020-07-26 LAB — LDL CHOLESTEROL, DIRECT: Direct LDL: 96 mg/dL

## 2020-07-26 LAB — CBC
HCT: 44.7 % (ref 39.0–52.0)
Hemoglobin: 15.1 g/dL (ref 13.0–17.0)
MCHC: 33.8 g/dL (ref 30.0–36.0)
MCV: 95.3 fl (ref 78.0–100.0)
Platelets: 124 10*3/uL — ABNORMAL LOW (ref 150.0–400.0)
RBC: 4.69 Mil/uL (ref 4.22–5.81)
RDW: 14 % (ref 11.5–15.5)
WBC: 6.4 10*3/uL (ref 4.0–10.5)

## 2020-07-26 MED ORDER — PROPRANOLOL HCL ER 80 MG PO CP24: 80.0000 mg | ORAL_CAPSULE | Freq: Every day | ORAL | 3 refills | Status: DC

## 2020-07-26 NOTE — Patient Instructions (Signed)
Great to see you again today!  I will be in touch with your labs  Wonderful job with exercise- try to keep this up  We can try the once a day propranolol for you- extended release.  Let me know if this does not seem to control your tremor as well, or if you have any low heart rate or feel weak/ faint  Please see me in about 6 months

## 2020-07-28 ENCOUNTER — Encounter: Payer: Self-pay | Admitting: Family Medicine

## 2020-07-28 DIAGNOSIS — E785 Hyperlipidemia, unspecified: Secondary | ICD-10-CM

## 2020-07-30 ENCOUNTER — Other Ambulatory Visit: Payer: Self-pay | Admitting: Nurse Practitioner

## 2020-07-31 ENCOUNTER — Ambulatory Visit: Payer: Medicare Other | Attending: Internal Medicine

## 2020-07-31 ENCOUNTER — Other Ambulatory Visit (HOSPITAL_BASED_OUTPATIENT_CLINIC_OR_DEPARTMENT_OTHER): Payer: Self-pay | Admitting: Internal Medicine

## 2020-07-31 DIAGNOSIS — Z23 Encounter for immunization: Secondary | ICD-10-CM

## 2020-07-31 MED ORDER — SIMVASTATIN 10 MG PO TABS: 10.0000 mg | ORAL_TABLET | Freq: Every day | ORAL | 3 refills | Status: DC

## 2020-07-31 NOTE — Progress Notes (Signed)
° °  Covid-19 Vaccination Clinic  Name:  Patrick Hatfield    MRN: [id number] DOB: 04-Jan-1935  07/31/2020  [name] was observed post Covid-19 immunization for 15 minutes without incident. He was provided with Vaccine Information Sheet and instruction to access the V-Safe system.   [name] was instructed to call 911 with any severe reactions post vaccine:  Difficulty breathing   Swelling of face and throat   A fast heartbeat   A bad rash all over body   Dizziness and weakness   Immunizations Administered    Name Date Dose VIS Date Route   Pfizer COVID-19 Vaccine 07/31/2020 10:53 AM 0.3 mL 06/06/2020 Intramuscular   Manufacturer: [employer]   Lot: [id number]   [place]: [id number]

## 2020-07-31 NOTE — Addendum Note (Signed)
Addended by: Lamar Blinks C on: 07/31/2020 07:18 AM   Modules accepted: Orders

## 2020-08-02 ENCOUNTER — Other Ambulatory Visit: Payer: Self-pay | Admitting: Family Medicine

## 2020-08-02 ENCOUNTER — Ambulatory Visit (INDEPENDENT_AMBULATORY_CARE_PROVIDER_SITE_OTHER): Payer: Medicare Other | Admitting: General Practice

## 2020-08-02 ENCOUNTER — Other Ambulatory Visit: Payer: Self-pay

## 2020-08-02 ENCOUNTER — Encounter: Payer: Self-pay | Admitting: Family Medicine

## 2020-08-02 DIAGNOSIS — Z86718 Personal history of other venous thrombosis and embolism: Secondary | ICD-10-CM

## 2020-08-02 DIAGNOSIS — Z7901 Long term (current) use of anticoagulants: Secondary | ICD-10-CM | POA: Diagnosis not present

## 2020-08-02 LAB — POCT INR: INR: 2 (ref 2.0–3.0)

## 2020-08-02 NOTE — Progress Notes (Signed)
Medical screening examination/treatment/procedure(s) were performed by non-physician practitioner and as supervising physician I was immediately available for consultation/collaboration. I agree with above. Wlliam Branon Sabine, MD   

## 2020-08-02 NOTE — Patient Instructions (Addendum)
Pre visit review using our clinic review tool, if applicable. No additional management support is needed unless otherwise documented below in the visit note.  Continue to take 1/2 tablet all days except 1 tablet on Tuesday and Saturdays.  Re-check in 6 weeks.  

## 2020-08-06 MED FILL — PFIZER-BIONTECH COVID-19 VA: 30 | 1 days supply | Qty: 0 | Fill #0

## 2020-08-24 ENCOUNTER — Other Ambulatory Visit: Payer: Self-pay | Admitting: Nurse Practitioner

## 2020-08-25 ENCOUNTER — Encounter: Payer: Self-pay | Admitting: Family Medicine

## 2020-09-13 ENCOUNTER — Other Ambulatory Visit: Payer: Self-pay

## 2020-09-13 ENCOUNTER — Ambulatory Visit (INDEPENDENT_AMBULATORY_CARE_PROVIDER_SITE_OTHER): Payer: Medicare Other | Admitting: General Practice

## 2020-09-13 DIAGNOSIS — Z7901 Long term (current) use of anticoagulants: Secondary | ICD-10-CM

## 2020-09-13 DIAGNOSIS — Z86718 Personal history of other venous thrombosis and embolism: Secondary | ICD-10-CM | POA: Diagnosis not present

## 2020-09-13 LAB — POCT INR: INR: 2.4 (ref 2.0–3.0)

## 2020-09-13 NOTE — Patient Instructions (Addendum)
Pre visit review using our clinic review tool, if applicable. No additional management support is needed unless otherwise documented below in the visit note.  Continue to take 1/2 tablet all days except 1 tablet on Tuesday and Saturdays.  Re-check in 6 weeks.  

## 2020-09-13 NOTE — Progress Notes (Signed)
Medical screening examination/treatment/procedure(s) were performed by non-physician practitioner and as supervising physician I was immediately available for consultation/collaboration. I agree with above. Tytan John, MD   

## 2020-09-17 ENCOUNTER — Other Ambulatory Visit: Payer: Self-pay | Admitting: Nurse Practitioner

## 2020-09-28 ENCOUNTER — Other Ambulatory Visit: Payer: Self-pay | Admitting: Family Medicine

## 2020-09-28 DIAGNOSIS — I10 Essential (primary) hypertension: Secondary | ICD-10-CM

## 2020-09-28 NOTE — Progress Notes (Signed)
Chronic Care Management Pharmacy Note  10/02/2020 Name:  Patrick Hatfield MRN:  [id number] DOB:  1934/11/27  Subjective: Patrick Hatfield is an 86 y.o. year old male who is a primary patient of Copland, Gay Filler, MD.  The CCM team was consulted for assistance with disease management and care coordination needs.    Engaged with patient by telephone for follow up visit in response to provider referral for pharmacy case management and/or care coordination services.   Consent to Services:  The patient was given the following information about Chronic Care Management services today, agreed to services, and gave verbal consent: 1. CCM service includes personalized support from designated clinical staff supervised by the primary care provider, including individualized plan of care and coordination with other care providers 2. 24/7 contact phone numbers for assistance for urgent and routine care needs. 3. Service will only be billed when office clinical staff spend 20 minutes or more in a month to coordinate care. 4. Only one practitioner may furnish and bill the service in a calendar month. 5.The patient may stop CCM services at any time (effective at the end of the month) by phone call to the office staff. 6. The patient will be responsible for cost sharing (co-pay) of up to 20% of the service fee (after annual deductible is met). Patient agreed to services and consent obtained.  Patient Care Team: Copland, Gay Filler, MD as PCP - General (Family Medicine) Latanya Maudlin, MD as Consulting Physician (Orthopedic Surgery) Gatha Mayer, MD as Consulting Physician (Gastroenterology) Marshell Garfinkel, MD as Consulting Physician (Pulmonary Disease) Edythe Clarity, [facility] (Pharmacist)  Recent office visits: 10/01/20 (Copland) - urinary frequency, treated for UTI with Macrobid  07/26/2020 (Copland) - propranolol changed to ER version as he was taking once daily.   Elevated cholesterol, started in  simvastatin 49m taking every other day with plans to titrate to daily if tolerated.  Hospital visits: 04/27/2020 (ED) - flank pain and cough, COVID-19 positive, he received the MAB infusion  Objective:  Lab Results  Component Value Date   CREATININE 0.86 07/26/2020   BUN 14 07/26/2020   GFR 79.92 07/26/2020   GFRNONAA >60 11/22/2019   GFRAA >60 11/22/2019   NA 140 07/26/2020   K 4.3 07/26/2020   CALCIUM 9.5 07/26/2020   CO2 32 07/26/2020    Lab Results  Component Value Date/Time   HGBA1C 5.2 03/02/2019 10:49 AM   HGBA1C 5.4 10/18/2014 09:42 AM   GFR 79.92 07/26/2020 11:29 AM   GFR 102.57 12/05/2019 03:05 PM    Last diabetic Eye exam: No results found for: HMDIABEYEEXA  Last diabetic Foot exam: No results found for: HMDIABFOOTEX   Lab Results  Component Value Date   CHOL 176 07/26/2020   HDL 36.10 (L) 07/26/2020   LDLCALC 164 (H) 03/02/2019   LDLDIRECT 96.0 07/26/2020   TRIG 267.0 (H) 07/26/2020   CHOLHDL 5 07/26/2020    Hepatic Function Latest Ref Rng & Units 07/26/2020 12/05/2019 11/22/2019  Total Protein 6.0 - 8.3 g/dL 7.6 7.3 8.0  Albumin 3.5 - 5.2 g/dL 4.3 3.7 3.7  AST 0 - 37 U/L '25 20 23  ' ALT 0 - 53 U/L '27 17 30  ' Alk Phosphatase 39 - 117 U/L 49 64 53  Total Bilirubin 0.2 - 1.2 mg/dL 2.3(H) 1.5(H) 2.7(H)  Bilirubin, Direct 0.0 - 0.3 mg/dL - - -    Lab Results  Component Value Date/Time   TSH 1.54 03/02/2019 10:49 AM   TSH 1.63 02/08/2014  10:12 AM    CBC Latest Ref Rng & Units 07/26/2020 12/05/2019 11/22/2019  WBC 4.0 - 10.5 K/uL 6.4 10.9(H) 10.1  Hemoglobin 13.0 - 17.0 g/dL 15.1 15.0 16.3  Hematocrit 39.0 - 52.0 % 44.7 44.9 47.5  Platelets 150.0 - 400.0 K/uL 124.0(L) 197.0 114(L)    No results found for: VD25OH  Clinical ASCVD: No  The ASCVD Risk score Mikey Bussing DC Jr., et al., 2013) failed to calculate for the following reasons:   The 2013 ASCVD risk score is only valid for ages 54 to 64    Depression screen PHQ 2/9 07/26/2020 12/14/2018 12/03/2017   Decreased Interest 0 0 0  Down, Depressed, Hopeless 0 0 0  PHQ - 2 Score 0 0 0  Some recent data might be hidden      Social History   Tobacco Use  Smoking Status Former Smoker  . Packs/day: 2.00  . Years: 40.00  . Pack years: 80.00  . Types: Cigarettes  . Quit date: 08/18/1994  . Years since quitting: 26.1  Smokeless Tobacco Never Used  Tobacco Comment   smoked 1956-1996, up to 2 ppd   BP Readings from Last 3 Encounters:  10/01/20 122/86  07/26/20 128/78  04/28/20 122/68   Pulse Readings from Last 3 Encounters:  10/01/20 72  07/26/20 65  04/28/20 (!) 50   Wt Readings from Last 3 Encounters:  10/01/20 209 lb (94.8 kg)  07/26/20 211 lb (95.7 kg)  04/27/20 205 lb (93 kg)    Assessment/Interventions: Review of patient past medical history, allergies, medications, health status, including review of consultants reports, laboratory and other test data, was performed as part of comprehensive evaluation and provision of chronic care management services.   SDOH:  (Social Determinants of Health) assessments and interventions performed: No   CCM Care Plan  Allergies  Allergen Reactions  . Amlodipine Besy-Benazepril Hcl     REACTION: swelling of feet.  Probably  from Amlodipine component)  . Lipitor [Atorvastatin Calcium]     ? Reaction, ? Swelling=- update 7/20.  Pt recalls his legs hurt and perhaps swelled slightly - JC    Medications Reviewed Today    Reviewed by Edythe Clarity, [facility] (Pharmacist) on 10/02/20 at 1501  Med List Status: <None>  Medication Order Taking? Sig Documenting Provider Last Dose Status Informant  acetaminophen (TYLENOL) 325 MG tablet [id number] Yes Per bottle as needed [provider] Taking Active            Med Note [name]   Fri Mar 30, 2020 11:11 AM) As needed  Al Hyd-Mg Tr-Alg Ac-Sod Bicarb (GAVISCON-2 PO) [id number] Yes Take by mouth. [provider] Taking Active            Med Note [name]   Fri Mar 30, 2020 11:11 AM) As needed  cholecalciferol (VITAMIN D) 1000 UNITS tablet [id number] Yes Take 1,000 Units by mouth daily. [provider] Taking Active   diazepam (VALIUM) 5 MG tablet [id number] Yes TAKE 1/2 TO 1 TABLET BY MOUTH AT BEDTIME AS NEEDED FOR SLEEP Copland, Gay Filler, MD Taking Active   famotidine (PEPCID) 20 MG tablet [id number] Yes Take 1 tablet (20 mg total) by mouth 2 (two) times daily. Noralyn Pick, NP Taking Active   Fluticasone Propionate [facility] NA) [id number] Yes Place into the nose as needed. [provider] Taking Active            Med Note [name]   Fri Mar 30, 2020 11:13  AM) As needed  folic acid (FOLVITE) 481 MCG tablet [id number] Yes Take 400 mcg by mouth daily. [provider] Taking Active   gabapentin (NEURONTIN) 300 MG capsule [id number] Yes TAKE 1 OR 2 CAPSULES BY MOUTH AT BEDTIME AS NEEDED Copland, Gay Filler, MD Taking Active   losartan-hydrochlorothiazide (HYZAAR) 100-12.5 MG tablet [id number] Yes TAKE 1 TABLET BY MOUTH EVERY DAY Copland, Gay Filler, MD Taking Active   nitrofurantoin, macrocrystal-monohydrate, (MACROBID) 100 MG capsule [id number] Yes Take 1 capsule (100 mg total) by mouth 2 (two) times daily. Copland, Gay Filler, MD Taking Active   Omega-3 Fatty Acids (FISH OIL) 1200 MG CAPS [id number] Yes Take 2,400 mg by mouth daily. [provider] Taking Active   propranolol ER (INDERAL LA) 80 MG 24 hr capsule [id number] Yes Take 1 capsule (80 mg total) by mouth daily. Copland, Gay Filler, MD Taking Active   simvastatin (ZOCOR) 10 MG tablet [id number] Yes Take 1 tablet (10 mg total) by mouth at bedtime. Copland, Gay Filler, MD Taking Active   warfarin (COUMADIN) 5 MG tablet [id number] Yes TAKE AS DIRECTED BY ANTICOAGULATION CLINIC Copland, Gay Filler, MD Taking Active           Patient Active Problem List   Diagnosis Date Noted  . [name] diarrhea 12/28/2019  . Hearing loss 01/26/2018  . Long term (current) use of  anticoagulants 05/15/2017  . Encounter for therapeutic drug monitoring 12/05/2016  . Insomnia 09/05/2015  . Diarrhea 09/05/2015  . Hx of adenomatous colonic polyps 12/14/2014  . Hematuria, gross 02/03/2014  . Thrombosis of mesenteric vein (HCC) 07/12/2013  . Thrombocytopenia, unspecified ([place]) 01/05/2013  . Gilbert syndrome 01/05/2013  . Unspecified adverse effect of unspecified drug, medicinal and biological substance 01/09/2012  . Barrett's esophagus 03/24/2011  . Factor 5 Leiden mutation, heterozygous ([place]) 03/21/2011  . Essential tremor 04/24/2009  . HYPERLIPIDEMIA 10/03/2008  . Essential hypertension 10/03/2008  . GERD 10/03/2008  . Fasting hyperglycemia 10/03/2008  . DVT, HX OF 10/03/2008    Immunization History  Administered Date(s) Administered  . DT (Pediatric) 02/16/2015  . Fluad Quad(high Dose 65+) 05/10/2019, 05/17/2020  . Influenza Split 05/03/2012  . Influenza Whole 07/24/2010  . Influenza, High Dose Seasonal PF 05/20/2013, 05/09/2016, 05/15/2017, 04/20/2018  . Influenza,inj,Quad PF,6+ Mos 05/03/2014, 05/16/2015  . PFIZER(Purple Top)SARS-COV-2 Vaccination 08/27/2019, 09/17/2019, 07/31/2020  . Pneumococcal Conjugate-13 09/05/2015  . Pneumococcal Polysaccharide-23 09/26/2016  . Td 03/03/2015  . Tdap 03/08/2015  . Zoster Recombinat (Shingrix) 02/22/2018, 10/05/2018    Conditions to be addressed/monitored:  Hypertension, Hyperlipidemia, Factor 5 Leiden, Hx of Barrett's Esophagus/Hx of C.diff/GERD, BPH   Care Plan : General Pharmacy (Adult)  Updates made by Edythe Clarity, RPH since 10/02/2020 12:00 AM    Problem: Hypertension, Hyperlipidemia, Factor 5 Leiden, Hx of Barrett's Esophagus/Hx of C.diff/GERD,   Priority: High  Onset Date: 10/02/2020    Goal: Patient-Specific Goal   Start Date: 10/02/2020  Expected End Date: 04/01/2021  This Visit's Progress: On track  Priority: High  Note:   Current Barriers:  . Unable to achieve control of lipids    Pharmacist Clinical Goal(s):  Marland Kitchen Over the next 180 days, patient will achieve adherence to monitoring guidelines and medication adherence to achieve therapeutic efficacy . achieve control of lipids  as evidenced by updated lipid panel through collaboration with PharmD and provider.   Interventions: . 1:1 collaboration with Copland, Gay Filler, MD regarding development and update of comprehensive plan of care as evidenced by provider attestation and co-signature . Inter-disciplinary care  team collaboration (see longitudinal plan of care) . Comprehensive medication review performed; medication list updated in electronic medical record  Hypertension (BP goal <140/90) -controlled -Current treatment:  Losartan/hctz 100/12.76m daily AM  Propranolol LA 838mdaily (for tremors) -Medications previously tried: amlodipine-benazepril (swelling in feet)  -Current home readings: nothing specific, he does monitor it and states it is normally around 120/80.  Office BP matches this report. -Current exercise habits: walks daily, except recently has had some back pain -Denies hypotensive/hypertensive symptoms -Educated on BP goals and benefits of medications for prevention of heart attack, stroke and kidney damage; Importance of home blood pressure monitoring; -Counseled to monitor BP at home weekly, document, and provide log at future appointments -Recommended to continue current medication Recommended he rest while his back is healing, then get back to normal exercise routine  Hyperlipidemia: (LDL goal < 100) -uncontrolled -Current treatment: . Simvastatin 1089ms . Fish Oil 1200m74mily -Medications previously tried: atorvastatin (swelling)  -Current dietary patterns:  B: over easy eggs (once a month with gravy), usually small bowel of cereal with banana, or #2 hard boiled eggs L: [place] and bacon sandwich D: Salads -Current exercise habits: see above -He is now taking his simvastatin daily  and tolerating fine, denies myalgias -Educated on Cholesterol goals;  Benefits of statin for ASCVD risk reduction; -Recommended to continue current medication  Factor V Leiden Deficiency (Goal: maintain INR) -controlled -Current treatment   Warfarin 5mg 39mly as directed (5mgT,42m, 2.5mg EO51[facility] anticoag clinic) -Medications previously tried:none noted - No changes to warfarin regimen  -Recommended to continue current medication   Patient Goals/Self-Care Activities . Over the next 180 days, patient will:  - take medications as prescribed check blood pressure weekly, document, and provide at future appointments target a minimum of 150 minutes of moderate intensity exercise weekly  Follow Up Plan: The care management team will reach out to the patient again over the next 180 days.         Medication Assistance: None required.  Patient affirms current coverage meets needs.  Patient's preferred pharmacy is:  [id number] [phone][address] [phone] box? Yes - one for morning and one for night Pt endorses 100% compliance  We discussed: Benefits of medication synchronization, packaging and delivery as well as enhanced pharmacist oversight with [employer]. Patient decided to: Continue current medication management strategy  Care Plan and Follow Up Patient Decision:  Patient agrees to Care Plan and Follow-up.  Plan: The care management team will reach out to the patient again over the next 180 days.  ChristiaBeverly Milch Clinical Pharmacist [phone]

## 2020-09-30 ENCOUNTER — Encounter: Payer: Self-pay | Admitting: Family Medicine

## 2020-10-01 ENCOUNTER — Encounter: Payer: Self-pay | Admitting: Family Medicine

## 2020-10-01 ENCOUNTER — Telehealth: Payer: Medicare Other

## 2020-10-01 ENCOUNTER — Ambulatory Visit (INDEPENDENT_AMBULATORY_CARE_PROVIDER_SITE_OTHER): Payer: Medicare Other | Admitting: Family Medicine

## 2020-10-01 ENCOUNTER — Other Ambulatory Visit: Payer: Self-pay

## 2020-10-01 VITALS — BP 122/86 | HR 72 | Temp 97.8°F | Resp 17 | Ht 70.0 in | Wt 209.0 lb

## 2020-10-01 DIAGNOSIS — R310 Gross hematuria: Secondary | ICD-10-CM

## 2020-10-01 DIAGNOSIS — R350 Frequency of micturition: Secondary | ICD-10-CM

## 2020-10-01 DIAGNOSIS — M5459 Other low back pain: Secondary | ICD-10-CM | POA: Diagnosis not present

## 2020-10-01 LAB — POCT URINALYSIS DIP (MANUAL ENTRY)
Bilirubin, UA: NEGATIVE
Glucose, UA: NEGATIVE mg/dL
Ketones, POC UA: NEGATIVE mg/dL
Leukocytes, UA: NEGATIVE
Nitrite, UA: NEGATIVE
Spec Grav, UA: 1.025 (ref 1.010–1.025)
Urobilinogen, UA: 0.2 E.U./dL
pH, UA: 5.5 (ref 5.0–8.0)

## 2020-10-01 LAB — POCT INR: INR: 2.1 (ref 2.0–3.0)

## 2020-10-01 MED ORDER — NITROFURANTOIN MONOHYD MACRO 100 MG PO CAPS: 100.0000 mg | ORAL_CAPSULE | Freq: Two times a day (BID) | ORAL | 0 refills | Status: DC

## 2020-10-01 NOTE — Progress Notes (Addendum)
[facility] [place], [place], [place] [address] [phone]  Date:  10/01/2020   Name:  Patrick Hatfield   DOB:  [birth year]   MRN:  [id number]  PCP:  Darreld Mclean, MD    Chief Complaint: Hematuria (Trace blood in urine, couple days ago, possible kidney infection, back pain but dx sciatica)   History of Present Illness:  Patrick Hatfield is a 86 y.o. very pleasant male patient who presents with the following:  Visit today to discuss possible urinary tract infection/blood in urine Patient last seen by myself in December- history of factor V Leiden mutation on Coumadin, hypertension, GERD/Barrett's esophagus,Gilbertsyndrome, hyperlipidemia, [name] diarrhea following antibiotics, essential/benign familial tremor  Coumadin is managed by cardiology anticoagulation clinic  Lab Results  Component Value Date   INR 2.1 10/01/2020   INR 2.4 09/13/2020   INR 2.0 08/02/2020   PROTIME 22.8 (H) 07/11/2013   PROTIME 30.0 (H) 07/13/2012   PROTIME 16.8 (H) 05/19/2011   He saw ortho this am for hip pain- dx with sciatica He got a cortisone shot and an rx for what we think is oral pred- he hopes not to have to use the oral prednisone Heat and sitting in his recliner also helps relieve his pain  He notes urinary frequency for about 5 days  He noted a trace of blood in his urine stream once- it was not enough to turn his urine red in the toilet bowl however He did see urology for gross hematuria approximately 10 years ago- they were not able to find anything wrong No pain with urination  No burning  No history of kidney stones No fever or abdominal pain  He walks every morning for exercise - this has been put on hold due to his back pain for the last several days however   Patient Active Problem List   Diagnosis Date Noted  . [name] diarrhea 12/28/2019  . Hearing loss 01/26/2018  . Long term (current)  use of anticoagulants 05/15/2017  . Encounter for therapeutic drug monitoring 12/05/2016  . Insomnia 09/05/2015  . Diarrhea 09/05/2015  . Hx of adenomatous colonic polyps 12/14/2014  . Hematuria, gross 02/03/2014  . Thrombosis of mesenteric vein (HCC) 07/12/2013  . Thrombocytopenia, unspecified ([place]) 01/05/2013  . Gilbert syndrome 01/05/2013  . Unspecified adverse effect of unspecified drug, medicinal and biological substance 01/09/2012  . Barrett's esophagus 03/24/2011  . Factor 5 Leiden mutation, heterozygous ([place]) 03/21/2011  . Essential tremor 04/24/2009  . HYPERLIPIDEMIA 10/03/2008  . Essential hypertension 10/03/2008  . GERD 10/03/2008  . Fasting hyperglycemia 10/03/2008  . DVT, HX OF 10/03/2008    Past Medical History:  Diagnosis Date  . Anxiety   . Arthritis    KNEES,ANKLES,HANDS  . Asthma    AS A TEENAGER  . Barrett's esophagus   . Benign essential tremor   . Clotting disorder ([place])    FACTOR 5  . Colitis in 20's  . Colon polyps   . Congenital deficiency of other clotting factors    factor 5  . Depression 2006   drug overdose  . Diverticulosis   . DVT (deep venous thrombosis) ([place]) 1991 & 2010   X 2 in context Factor 5 def  . Esophageal reflux   . Factor 5 Leiden mutation, heterozygous ([place])   . Gilbert's syndrome   . H/O ischemic bowel disease 2010   while off warfarin   .  Herpes zoster 2010   facial  . Hiatal hernia   . Hx of adenomatous colonic polyps 12/14/2014  . Hyperlipidemia   . Hypertension   . Insomnia   . Osteoporosis   . Personal history of colonic polyps 05/28/2007   adenomatous  . Proteinuria age 89  . Thrombosis of mesenteric vein ([place]) 07/12/2013   03/2009  . Umbilical hernia     Past Surgical History:  Procedure Laterality Date  . CHOLECYSTECTOMY  05/16/2011    Dr Marlou Starks  . COLONOSCOPY     Dr Sharlett Iles  . CYSTOSCOPY     Neg  . EYE SURGERY Bilateral 10/16/2017   secondary catarct surgery.  Marland Kitchen KNEE ARTHROSCOPY Bilateral 2008,  2012   Dr.Aplington bilateral  . TONSILLECTOMY    . TRANSURETHRAL RESECTION OF PROSTATE      BPH;Dr Dahlstedt    Social History   Tobacco Use  . Smoking status: Former Smoker    Packs/day: 2.00    Years: 40.00    Pack years: 80.00    Types: Cigarettes    Quit date: 08/18/1994    Years since quitting: 26.1  . Smokeless tobacco: Never Used  . Tobacco comment: smoked 1956-1996, up to 2 ppd  Vaping Use  . Vaping Use: Never used  Substance Use Topics  . Alcohol use: Not Currently  . Drug use: No    Family History  Problem Relation Age of Onset  . Pancreatic cancer Father 78  . Ulcers Father   . Stroke Father 73  . Diabetes Father   . Coronary artery disease Mother   . Osteoporosis Mother   . HIV Brother   . Multiple sclerosis Sister   . Heart attack Paternal Grandfather        early 9s  . Heart attack Paternal Uncle        2 uncles in early 20s  . Aortic aneurysm Brother   . Colon cancer Neg Hx   . Esophageal cancer Neg Hx   . Stomach cancer Neg Hx     Allergies  Allergen Reactions  . Amlodipine Besy-Benazepril Hcl     REACTION: swelling of feet.  Probably  from Amlodipine component)  . Lipitor [Atorvastatin Calcium]     ? Reaction, ? Swelling=- update 7/20.  Pt recalls his legs hurt and perhaps swelled slightly - JC    Medication list has been reviewed and updated.  Current Outpatient Medications on File Prior to Visit  Medication Sig Dispense Refill  . acetaminophen (TYLENOL) 325 MG tablet Per bottle as needed    . Al Hyd-Mg Tr-Alg Ac-Sod Bicarb (GAVISCON-2 PO) Take by mouth.    . cholecalciferol (VITAMIN D) 1000 UNITS tablet Take 1,000 Units by mouth daily.    . diazepam (VALIUM) 5 MG tablet TAKE 1/2 TO 1 TABLET BY MOUTH AT BEDTIME AS NEEDED FOR SLEEP 30 tablet 2  . famotidine (PEPCID) 20 MG tablet Take 1 tablet (20 mg total) by mouth 2 (two) times daily. 60 tablet 1  . Fluticasone Propionate (FLONASE NA) Place into the nose as needed.    . folic acid  (FOLVITE) 621 MCG tablet Take 400 mcg by mouth daily.    Marland Kitchen gabapentin (NEURONTIN) 300 MG capsule TAKE 1 OR 2 CAPSULES BY MOUTH AT BEDTIME AS NEEDED 180 capsule 1  . losartan-hydrochlorothiazide (HYZAAR) 100-12.5 MG tablet TAKE 1 TABLET BY MOUTH EVERY DAY 90 tablet 2  . Omega-3 Fatty Acids (FISH OIL) 1200 MG CAPS Take 2,400 mg by mouth daily.    Marland Kitchen  propranolol ER (INDERAL LA) 80 MG 24 hr capsule Take 1 capsule (80 mg total) by mouth daily. 90 capsule 3  . simvastatin (ZOCOR) 10 MG tablet Take 1 tablet (10 mg total) by mouth at bedtime. 90 tablet 3  . warfarin (COUMADIN) 5 MG tablet TAKE AS DIRECTED BY ANTICOAGULATION CLINIC 120 tablet 3   No current facility-administered medications on file prior to visit.    Review of Systems:  As per HPI- otherwise negative.   Physical Examination: Vitals:   10/01/20 1428  BP: 122/86  Pulse: 72  Resp: 17  Temp: 97.8 F (36.6 C)  SpO2: 95%   Vitals:   10/01/20 1428  Weight: 209 lb (94.8 kg)  Height: 5\' 10"  (1.778 m)   Body mass index is 29.99 kg/m. Ideal Body Weight: Weight in (lb) to have BMI = 25: 173.9  GEN: no acute distress.  Overweight, looks well HEENT: Atraumatic, Normocephalic.  Ears and Nose: No external deformity. CV: RRR, No M/G/R. No JVD. No thrill. No extra heart sounds. PULM: CTA B, no wheezes, crackles, rhonchi. No retractions. No resp. distress. No accessory muscle use. ABD: S, NT, ND, +BS. No rebound. No HSM. EXTR: No c/c/e PSYCH: Normally interactive. Conversant.  Belly is benign, no CVA tenderness   Creat clearance is over 80 by CG equation Results for orders placed or performed in visit on 10/01/20  POCT urinalysis dipstick  Result Value Ref Range   Color, UA yellow yellow   Clarity, UA clear clear   Glucose, UA negative negative mg/dL   Bilirubin, UA negative negative   Ketones, POC UA negative negative mg/dL   Spec Grav, UA 1.025 1.010 - 1.025   Blood, UA large (A) negative   pH, UA 5.5 5.0 - 8.0    Protein Ur, POC trace (A) negative mg/dL   Urobilinogen, UA 0.2 0.2 or 1.0 E.U./dL   Nitrite, UA Negative Negative   Leukocytes, UA Negative Negative  POCT INR  Result Value Ref Range   INR 2.1 2.0 - 3.0    Assessment and Plan: Urinary frequency - Plan: POCT urinalysis dipstick, Urine Culture, nitrofurantoin, macrocrystal-monohydrate, (MACROBID) 100 MG capsule  Gross hematuria - Plan: POCT INR  Patient today with episode of gross hematuria.  He noted a trace of blood in his urine stream recently.  He also has some urinary frequency He does have history of [name], he is understandably cautious with antibiotics.  I prescribed Macrobid to use while urine culture is pending We checked INR today, it is in range He has not passed any blood clots in urine I advised him that if his urine culture is negative for infection we will want to pursue further evaluation of hematuria, in order to rule out urinary tract cancer.  He states understanding This visit occurred during the SARS-CoV-2 public health emergency.  Safety protocols were in place, including screening questions prior to the visit, additional usage of staff PPE, and extensive cleaning of exam room while observing appropriate contact time as indicated for disinfecting solutions.    Signed Lamar Blinks, MD  addnd 2/16- received labs as below:  Results for orders placed or performed in visit on 10/01/20  Urine Culture   Specimen: Urine  Result Value Ref Range   MICRO NUMBER: [id number]    SPECIMEN QUALITY: Adequate    Sample Source NOT GIVEN    STATUS: FINAL    Result: No Growth   POCT urinalysis dipstick  Result Value Ref Range   Color, UA  yellow yellow   Clarity, UA clear clear   Glucose, UA negative negative mg/dL   Bilirubin, UA negative negative   Ketones, POC UA negative negative mg/dL   Spec Grav, UA 1.025 1.010 - 1.025   Blood, UA large (A) negative   pH, UA 5.5 5.0 - 8.0   Protein Ur, POC trace (A) negative  mg/dL   Urobilinogen, UA 0.2 0.2 or 1.0 E.U./dL   Nitrite, UA Negative Negative   Leukocytes, UA Negative Negative  POCT INR  Result Value Ref Range   INR 2.1 2.0 - 3.0   Message to pt- urine culture negative  Needs urology referral

## 2020-10-01 NOTE — Patient Instructions (Signed)
Good to see you again today!  Your INR is in range- no issue here We will send your urine for a culture- I will be in touch with this asap We will treat you for a possible urinary tract infection with macrobid for 7 days while your culture is pending If you do NOT have an infection we will want to look further for any source of bleeding   Please let me know if any other concerns

## 2020-10-02 ENCOUNTER — Ambulatory Visit (INDEPENDENT_AMBULATORY_CARE_PROVIDER_SITE_OTHER): Payer: Medicare Other

## 2020-10-02 DIAGNOSIS — D6851 Activated protein C resistance: Secondary | ICD-10-CM

## 2020-10-02 DIAGNOSIS — I10 Essential (primary) hypertension: Secondary | ICD-10-CM | POA: Diagnosis not present

## 2020-10-02 DIAGNOSIS — E785 Hyperlipidemia, unspecified: Secondary | ICD-10-CM | POA: Diagnosis not present

## 2020-10-02 LAB — URINE CULTURE
MICRO NUMBER:: 11530789
Result:: NO GROWTH
SPECIMEN QUALITY:: ADEQUATE

## 2020-10-02 NOTE — Patient Instructions (Addendum)
Visit Information  Goals Addressed            This Visit's Progress   . Manage My Medicine       Timeframe:  Long-Range Goal Priority:  Medium Start Date:       10/02/20                      Expected End Date:      04/01/21                 Follow Up Date 01/15/21   - call for medicine refill 2 or 3 days before it runs out - keep a list of all the medicines I take; vitamins and herbals too - use a pillbox to sort medicine    Why is this important?   . These steps will help you keep on track with your medicines.   Notes: Keep up the good work maintaining your health!      Patient Care Plan: General Pharmacy (Adult)    Problem Identified: Hypertension, Hyperlipidemia, Factor 5 Leiden, Hx of Barrett's Esophagus/Hx of C.diff/GERD,   Priority: High  Onset Date: 10/02/2020    Goal: Patient-Specific Goal   Start Date: 10/02/2020  Expected End Date: 04/01/2021  This Visit's Progress: On track  Priority: High  Note:   Current Barriers:  . Unable to achieve control of lipids   Pharmacist Clinical Goal(s):  Marland Kitchen Over the next 180 days, patient will achieve adherence to monitoring guidelines and medication adherence to achieve therapeutic efficacy . achieve control of lipids  as evidenced by updated lipid panel through collaboration with PharmD and provider.   Interventions: . 1:1 collaboration with Copland, Gay Filler, MD regarding development and update of comprehensive plan of care as evidenced by provider attestation and co-signature . Inter-disciplinary care team collaboration (see longitudinal plan of care) . Comprehensive medication review performed; medication list updated in electronic medical record  Hypertension (BP goal <140/90) -controlled -Current treatment:  Losartan/hctz 100/12.5mg  daily AM  Propranolol LA 80mg  daily (for tremors) -Medications previously tried: amlodipine-benazepril (swelling in feet)  -Current home readings: nothing specific, he does monitor it  and states it is normally around 120/80.  Office BP matches this report. -Current exercise habits: walks daily, except recently has had some back pain -Denies hypotensive/hypertensive symptoms -Educated on BP goals and benefits of medications for prevention of heart attack, stroke and kidney damage; Importance of home blood pressure monitoring; -Counseled to monitor BP at home weekly, document, and provide log at future appointments -Recommended to continue current medication Recommended he rest while his back is healing, then get back to normal exercise routine  Hyperlipidemia: (LDL goal < 100) -uncontrolled -Current treatment: . Simvastatin 10mg  hs . Fish Oil 1200mg  daily -Medications previously tried: atorvastatin (swelling)  -Current dietary patterns:  B: over easy eggs (once a month with gravy), usually small bowel of cereal with banana, or #2 hard boiled eggs L: [place] and bacon sandwich D: Salads -Current exercise habits: see above -He is now taking his simvastatin daily and tolerating fine, denies myalgias -Educated on Cholesterol goals;  Benefits of statin for ASCVD risk reduction; -Recommended to continue current medication  Factor V Leiden Deficiency (Goal: maintain INR) -controlled -Current treatment   Warfarin 5mg  daily as directed (5mg T, Sa, 2.5mg  EOD per anticoag clinic) -Medications previously tried:none noted - No changes to warfarin regimen  -Recommended to continue current medication   Patient Goals/Self-Care Activities . Over the next 180 days, patient will:  -  take medications as prescribed check blood pressure weekly, document, and provide at future appointments target a minimum of 150 minutes of moderate intensity exercise weekly  Follow Up Plan: The care management team will reach out to the patient again over the next 180 days.        The patient verbalized understanding of instructions, educational materials, and care plan provided today and  agreed to receive a mailed copy of patient instructions, educational materials, and care plan.   Telephone follow up appointment with pharmacy team member scheduled for: 6 months  Edythe Clarity, [facility]  High Cholesterol  High cholesterol is a condition in which the blood has high levels of a white, waxy substance similar to fat (cholesterol). The liver makes all the cholesterol that the body needs. The human body needs small amounts of cholesterol to help build cells. A person gets extra or excess cholesterol from the food that he or she eats. The blood carries cholesterol from the liver to the rest of the body. If you have high cholesterol, deposits (plaques) may build up on the walls of your arteries. Arteries are the blood vessels that carry blood away from your heart. These plaques make the arteries narrow and stiff. Cholesterol plaques increase your risk for heart attack and stroke. Work with your health care provider to keep your cholesterol levels in a healthy range. What increases the risk? The following factors may make you more likely to develop this condition:  Eating foods that are high in animal fat (saturated fat) or cholesterol.  Being overweight.  Not getting enough exercise.  A family history of high cholesterol (familial hypercholesterolemia).  Use of tobacco products.  Having diabetes. What are the signs or symptoms? There are no symptoms of this condition. How is this diagnosed? This condition may be diagnosed based on the results of a blood test.  If you are older than 84 years of age, your health care provider may check your cholesterol levels every 4-6 years.  You may be checked more often if you have high cholesterol or other risk factors for heart disease. The blood test for cholesterol measures:  "Bad" cholesterol, or LDL cholesterol. This is the main type of cholesterol that causes heart disease. The desired level is less than 100 mg/dL.  "Good"  cholesterol, or HDL cholesterol. HDL helps protect against heart disease by cleaning the arteries and carrying the LDL to the liver for processing. The desired level for HDL is 60 mg/dL or higher.  Triglycerides. These are fats that your body can store or burn for energy. The desired level is less than 150 mg/dL.  Total cholesterol. This measures the total amount of cholesterol in your blood and includes LDL, HDL, and triglycerides. The desired level is less than 200 mg/dL. How is this treated? This condition may be treated with:  Diet changes. You may be asked to eat foods that have more fiber and less saturated fats or added sugar.  Lifestyle changes. These may include regular exercise, maintaining a healthy weight, and quitting use of tobacco products.  Medicines. These are given when diet and lifestyle changes have not worked. You may be prescribed a statin medicine to help lower your cholesterol levels. Follow these instructions at home: Eating and drinking  Eat a healthy, balanced diet. This diet includes: ? Daily servings of a variety of fresh, frozen, or canned fruits and vegetables. ? Daily servings of whole grain foods that are rich in fiber. ? Foods that are low  in saturated fats and trans fats. These include poultry and fish without skin, lean cuts of meat, and low-fat dairy products. ? A variety of fish, especially oily fish that contain omega-3 fatty acids. Aim to eat fish at least 2 times a week.  Avoid foods and drinks that have added sugar.  Use healthy cooking methods, such as roasting, grilling, broiling, baking, poaching, steaming, and stir-frying. Do not fry your food except for stir-frying.   Lifestyle  Get regular exercise. Aim to exercise for a total of 150 minutes a week. Increase your activity level by doing activities such as gardening, walking, and taking the stairs.  Do not use any products that contain nicotine or tobacco, such as cigarettes, e-cigarettes,  and chewing tobacco. If you need help quitting, ask your health care provider.   General instructions  Take over-the-counter and prescription medicines only as told by your health care provider.  Keep all follow-up visits as told by your health care provider. This is important. Where to find more information  American Heart Association: www.heart.org  National Heart, Lung, and Blood Institute: https://wilson-eaton.com/ Contact a health care provider if:  You have trouble achieving or maintaining a healthy diet or weight.  You are starting an exercise program.  You are unable to stop smoking. Get help right away if:  You have chest pain.  You have trouble breathing.  You have any symptoms of a stroke. "BE FAST" is an easy way to remember the main warning signs of a stroke: ? B - Balance. Signs are dizziness, sudden trouble walking, or loss of balance. ? E - Eyes. Signs are trouble seeing or a sudden change in vision. ? F - Face. Signs are sudden weakness or numbness of the face, or the face or eyelid drooping on one side. ? A - Arms. Signs are weakness or numbness in an arm. This happens suddenly and usually on one side of the body. ? S - Speech. Signs are sudden trouble speaking, slurred speech, or trouble understanding what people say. ? T - Time. Time to call emergency services. Write down what time symptoms started.  You have other signs of a stroke, such as: ? A sudden, severe headache with no known cause. ? Nausea or vomiting. ? Seizure. These symptoms may represent a serious problem that is an emergency. Do not wait to see if the symptoms will go away. Get medical help right away. Call your local emergency services (911 in the [place] Do not drive yourself to the hospital. Summary  Cholesterol plaques increase your risk for heart attack and stroke. Work with your health care provider to keep your cholesterol levels in a healthy range.  Eat a healthy, balanced diet, get regular  exercise, and maintain a healthy weight.  Do not use any products that contain nicotine or tobacco, such as cigarettes, e-cigarettes, and chewing tobacco.  Get help right away if you have any symptoms of a stroke. This information is not intended to replace advice given to you by your health care provider. Make sure you discuss any questions you have with your health care provider. Document Revised: 07/04/2019 Document Reviewed: 07/04/2019 Elsevier Patient Education  2021 [employer].

## 2020-10-03 ENCOUNTER — Encounter: Payer: Self-pay | Admitting: Family Medicine

## 2020-10-03 NOTE — Addendum Note (Signed)
Addended by: Lamar Blinks C on: 10/03/2020 09:07 AM   Modules accepted: Orders

## 2020-10-05 ENCOUNTER — Encounter: Payer: Self-pay | Admitting: Family Medicine

## 2020-10-12 DIAGNOSIS — M48061 Spinal stenosis, lumbar region without neurogenic claudication: Secondary | ICD-10-CM | POA: Diagnosis not present

## 2020-10-12 DIAGNOSIS — M4807 Spinal stenosis, lumbosacral region: Secondary | ICD-10-CM | POA: Diagnosis not present

## 2020-10-12 DIAGNOSIS — M4316 Spondylolisthesis, lumbar region: Secondary | ICD-10-CM | POA: Diagnosis not present

## 2020-10-12 DIAGNOSIS — M47816 Spondylosis without myelopathy or radiculopathy, lumbar region: Secondary | ICD-10-CM | POA: Diagnosis not present

## 2020-10-12 DIAGNOSIS — M47817 Spondylosis without myelopathy or radiculopathy, lumbosacral region: Secondary | ICD-10-CM | POA: Diagnosis not present

## 2020-10-12 DIAGNOSIS — M5125 Other intervertebral disc displacement, thoracolumbar region: Secondary | ICD-10-CM | POA: Diagnosis not present

## 2020-10-12 DIAGNOSIS — M5126 Other intervertebral disc displacement, lumbar region: Secondary | ICD-10-CM | POA: Diagnosis not present

## 2020-10-12 DIAGNOSIS — M47815 Spondylosis without myelopathy or radiculopathy, thoracolumbar region: Secondary | ICD-10-CM | POA: Diagnosis not present

## 2020-10-12 DIAGNOSIS — M5127 Other intervertebral disc displacement, lumbosacral region: Secondary | ICD-10-CM | POA: Diagnosis not present

## 2020-10-16 DIAGNOSIS — M5459 Other low back pain: Secondary | ICD-10-CM | POA: Diagnosis not present

## 2020-10-25 ENCOUNTER — Ambulatory Visit: Payer: Medicare Other

## 2020-11-07 ENCOUNTER — Encounter: Payer: Self-pay | Admitting: Family Medicine

## 2020-11-07 DIAGNOSIS — M109 Gout, unspecified: Secondary | ICD-10-CM

## 2020-11-07 MED ORDER — COLCHICINE 0.6 MG PO TABS: ORAL_TABLET | ORAL | 0 refills | Status: DC

## 2020-11-08 ENCOUNTER — Other Ambulatory Visit: Payer: Self-pay

## 2020-11-08 ENCOUNTER — Ambulatory Visit (INDEPENDENT_AMBULATORY_CARE_PROVIDER_SITE_OTHER): Payer: Medicare Other | Admitting: Pharmacist Clinician (PhC)/ Clinical Pharmacy Specialist

## 2020-11-08 DIAGNOSIS — Z86718 Personal history of other venous thrombosis and embolism: Secondary | ICD-10-CM

## 2020-11-08 DIAGNOSIS — Z7901 Long term (current) use of anticoagulants: Secondary | ICD-10-CM | POA: Diagnosis not present

## 2020-11-08 DIAGNOSIS — M5416 Radiculopathy, lumbar region: Secondary | ICD-10-CM | POA: Diagnosis not present

## 2020-11-08 LAB — POCT INR: INR: 1 — AB (ref 2.0–3.0)

## 2020-11-09 ENCOUNTER — Other Ambulatory Visit: Payer: Self-pay | Admitting: Nurse Practitioner

## 2020-11-09 MED ORDER — LINACLOTIDE 145 MCG PO CAPS: 145.0000 ug | ORAL_CAPSULE | Freq: Every day | ORAL | 1 refills | Status: DC

## 2020-11-14 ENCOUNTER — Encounter: Payer: Self-pay | Admitting: Family Medicine

## 2020-11-16 NOTE — Patient Instructions (Addendum)
Good to see you again today - please let me know if your abdominal pain should return and in that case we will get labs and a CT scan

## 2020-11-16 NOTE — Progress Notes (Signed)
[facility] [place], [place], [place] [address] [phone]  Date:  11/19/2020   Name:  Patrick Hatfield   DOB:  11-06-1934   MRN:  [id number]  PCP:  Darreld Mclean, MD    Chief Complaint: Abdominal Pain (Follow up, has improved, lower abdominal pain)   History of Present Illness:  Patrick Hatfield is a 86 y.o. very pleasant male patient who presents with the following:  Pt here today with concern of abd pain- history of factor V Leiden mutation on Coumadin, hypertension, GERD/Barrett's esophagus,Gilbertsyndrome, hyperlipidemia, [name] diarrhea following antibiotics,essential/benign familial tremor  He recently send me the following mychart message: Dr Lorelei Pont for the past two months I have been experiencing lower abdominal pain below my belly button and across the front.  It's 24/7 and unrelenting.  At first I attributed it to the constipation caused by my back problem but now I know it must be more than just that. It never goes away it's constant day and night. It feels like I have a belt tightened around the front of my belly below the navel constantly! I've changed my diet, and I've adhered to every suggestion regarding the constipation problem and for the most part that's has been relieved to some extent, but this constant pain will just not go away.  I need your help.  [name]  Thankfully his sx seemed to finally go away over the last couple of days- now he is feeling back to normal  He started on linzess on 3/28- he is taking this daily and it helps with his constipation  He did a colonoscopy in 2016- he was told no need to repeat again  Prior to starting on linzess he tended more towards constipation   He has lost a few lbs- he thinks this is due to diet change and decreased calorie intake  Wt Readings from Last 3 Encounters:  11/19/20 200 lb (90.7 kg)  10/01/20 209 lb (94.8 kg)  07/26/20 211 lb (95.7  kg)    Patient Active Problem List   Diagnosis Date Noted  . [name] diarrhea 12/28/2019  . Hearing loss 01/26/2018  . Long term (current) use of anticoagulants 05/15/2017  . Encounter for therapeutic drug monitoring 12/05/2016  . Insomnia 09/05/2015  . Diarrhea 09/05/2015  . Hx of adenomatous colonic polyps 12/14/2014  . Hematuria, gross 02/03/2014  . Thrombosis of mesenteric vein (HCC) 07/12/2013  . Thrombocytopenia, unspecified ([place]) 01/05/2013  . Gilbert syndrome 01/05/2013  . Unspecified adverse effect of unspecified drug, medicinal and biological substance 01/09/2012  . Barrett's esophagus 03/24/2011  . Factor 5 Leiden mutation, heterozygous ([place]) 03/21/2011  . Essential tremor 04/24/2009  . HYPERLIPIDEMIA 10/03/2008  . Essential hypertension 10/03/2008  . GERD 10/03/2008  . Fasting hyperglycemia 10/03/2008  . DVT, HX OF 10/03/2008    Past Medical History:  Diagnosis Date  . Anxiety   . Arthritis    KNEES,ANKLES,HANDS  . Asthma    AS A TEENAGER  . Barrett's esophagus   . Benign essential tremor   . Clotting disorder ([place])    FACTOR 5  . Colitis in 20's  . Colon polyps   . Congenital deficiency of other clotting factors    factor 5  . Depression 2006   drug overdose  . Diverticulosis   . DVT (deep venous thrombosis) ([place]) 1991 & 2010   X 2 in context Factor 5 def  . Esophageal  reflux   . Factor 5 Leiden mutation, heterozygous ([place])   . Gilbert's syndrome   . H/O ischemic bowel disease 2010   while off warfarin   . Herpes zoster 2010   facial  . Hiatal hernia   . Hx of adenomatous colonic polyps 12/14/2014  . Hyperlipidemia   . Hypertension   . Insomnia   . Osteoporosis   . Personal history of colonic polyps 05/28/2007   adenomatous  . Proteinuria age 62  . Thrombosis of mesenteric vein ([place]) 07/12/2013   03/2009  . Umbilical hernia     Past Surgical History:  Procedure Laterality Date  . CHOLECYSTECTOMY  05/16/2011    Dr Marlou Starks  .  COLONOSCOPY     Dr Sharlett Iles  . CYSTOSCOPY     Neg  . EYE SURGERY Bilateral 10/16/2017   secondary catarct surgery.  Marland Kitchen KNEE ARTHROSCOPY Bilateral 2008, 2012   Dr.Aplington bilateral  . TONSILLECTOMY    . TRANSURETHRAL RESECTION OF PROSTATE      [facility];Dr Dahlstedt    Social History   Tobacco Use  . Smoking status: Former Smoker    Packs/day: 2.00    Years: 40.00    Pack years: 80.00    Types: Cigarettes    Quit date: 08/18/1994    Years since quitting: 26.2  . Smokeless tobacco: Never Used  . Tobacco comment: smoked 1956-1996, up to 2 ppd  Vaping Use  . Vaping Use: Never used  Substance Use Topics  . Alcohol use: Not Currently  . Drug use: No    Family History  Problem Relation Age of Onset  . Pancreatic cancer Father 56  . Ulcers Father   . Stroke Father 30  . Diabetes Father   . Coronary artery disease Mother   . Osteoporosis Mother   . HIV Brother   . Multiple sclerosis Sister   . Heart attack Paternal Grandfather        early 110s  . Heart attack Paternal Uncle        2 uncles in early 19s  . Aortic aneurysm Brother   . Colon cancer Neg Hx   . Esophageal cancer Neg Hx   . Stomach cancer Neg Hx     Allergies  Allergen Reactions  . Amlodipine Besy-Benazepril Hcl     REACTION: swelling of feet.  Probably  from Amlodipine component)  . Lipitor [Atorvastatin Calcium]     ? Reaction, ? Swelling=- update 7/20.  Pt recalls his legs hurt and perhaps swelled slightly - JC    Medication list has been reviewed and updated.  Current Outpatient Medications on File Prior to Visit  Medication Sig Dispense Refill  . acetaminophen (TYLENOL) 325 MG tablet Per bottle as needed    . Al Hyd-Mg Tr-Alg Ac-Sod Bicarb (GAVISCON-2 PO) Take by mouth.    . cholecalciferol (VITAMIN D) 1000 UNITS tablet Take 1,000 Units by mouth daily.    Marland Kitchen COVID-19 mRNA vaccine, Pfizer, 30 MCG/0.3ML injection INJECT AS DIRECTED .3 mL 0  . diazepam (VALIUM) 5 MG tablet TAKE 1/2 TO 1 TABLET BY MOUTH  AT BEDTIME AS NEEDED FOR SLEEP 30 tablet 2  . Fluticasone Propionate (FLONASE NA) Place into the nose as needed.    . folic acid (FOLVITE) 696 MCG tablet Take 400 mcg by mouth daily.    Marland Kitchen gabapentin (NEURONTIN) 300 MG capsule TAKE 1 OR 2 CAPSULES BY MOUTH AT BEDTIME AS NEEDED 180 capsule 1  . linaclotide (LINZESS) 145 MCG CAPS capsule Take 1  capsule (145 mcg total) by mouth daily before breakfast. 30 capsule 1  . losartan-hydrochlorothiazide (HYZAAR) 100-12.5 MG tablet TAKE 1 TABLET BY MOUTH EVERY DAY 90 tablet 2  . Omega-3 Fatty Acids (FISH OIL) 1200 MG CAPS Take 2,400 mg by mouth daily.    . propranolol ER (INDERAL LA) 80 MG 24 hr capsule Take 1 capsule (80 mg total) by mouth daily. 90 capsule 3  . simvastatin (ZOCOR) 10 MG tablet Take 1 tablet (10 mg total) by mouth at bedtime. 90 tablet 3  . warfarin (COUMADIN) 5 MG tablet TAKE AS DIRECTED BY ANTICOAGULATION CLINIC 120 tablet 3   No current facility-administered medications on file prior to visit.    Review of Systems:  As per HPI- otherwise negative.   Physical Examination: Vitals:   11/19/20 1529  BP: 124/68  Pulse: 70  Resp: 17  Temp: (!) 97.1 F (36.2 C)  SpO2: 99%   Vitals:   11/19/20 1529  Weight: 200 lb (90.7 kg)  Height: 5\' 10"  (1.778 m)   Body mass index is 28.7 kg/m. Ideal Body Weight: Weight in (lb) to have BMI = 25: 173.9  GEN: no acute distress. Overweight, looks well  HEENT: Atraumatic, Normocephalic.  Ears and Nose: No external deformity. CV: RRR, No M/G/R. No JVD. No thrill. No extra heart sounds. PULM: CTA B, no wheezes, crackles, rhonchi. No retractions. No resp. distress. No accessory muscle use. ABD: S, NT, ND, +BS. No rebound. No HSM. abd is benign  EXTR: No c/c/e PSYCH: Normally interactive. Conversant.    Assessment and Plan: Weight loss  Generalized abdominal pain  Pt here today to follow-up about 6 weeks of abd pain,  However over the last few days this has finally resolved Offered to  do labs today, and/ or a CT scan as he has also lost some weight (albeit with diet change). He declines for now, plans to see me in June for a physical and we can do labs then.  He will let me know if symptoms return or if any other concerns in the meantime  This visit occurred during the SARS-CoV-2 public health emergency.  Safety protocols were in place, including screening questions prior to the visit, additional usage of staff PPE, and extensive cleaning of exam room while observing appropriate contact time as indicated for disinfecting solutions.    Signed Lamar Blinks, MD

## 2020-11-19 ENCOUNTER — Other Ambulatory Visit: Payer: Self-pay

## 2020-11-19 ENCOUNTER — Ambulatory Visit (INDEPENDENT_AMBULATORY_CARE_PROVIDER_SITE_OTHER): Payer: Medicare Other | Admitting: Family Medicine

## 2020-11-19 ENCOUNTER — Encounter: Payer: Self-pay | Admitting: Family Medicine

## 2020-11-19 VITALS — BP 124/68 | HR 70 | Temp 97.1°F | Resp 17 | Ht 70.0 in | Wt 200.0 lb

## 2020-11-19 DIAGNOSIS — R1084 Generalized abdominal pain: Secondary | ICD-10-CM

## 2020-11-19 DIAGNOSIS — R634 Abnormal weight loss: Secondary | ICD-10-CM

## 2020-11-22 DIAGNOSIS — M5459 Other low back pain: Secondary | ICD-10-CM | POA: Diagnosis not present

## 2020-11-27 ENCOUNTER — Other Ambulatory Visit: Payer: Self-pay

## 2020-11-27 ENCOUNTER — Ambulatory Visit (INDEPENDENT_AMBULATORY_CARE_PROVIDER_SITE_OTHER): Payer: Medicare Other | Admitting: General Practice

## 2020-11-27 DIAGNOSIS — Z86718 Personal history of other venous thrombosis and embolism: Secondary | ICD-10-CM | POA: Diagnosis not present

## 2020-11-27 DIAGNOSIS — Z7901 Long term (current) use of anticoagulants: Secondary | ICD-10-CM | POA: Diagnosis not present

## 2020-11-27 LAB — POCT INR: INR: 1.9 — AB (ref 2.0–3.0)

## 2020-11-27 NOTE — Progress Notes (Signed)
Medical screening examination/treatment/procedure(s) were performed by non-physician practitioner and as supervising physician I was immediately available for consultation/collaboration. I agree with above. Andersen Jeorge Reister, MD   

## 2020-11-27 NOTE — Patient Instructions (Addendum)
Pre visit review using our clinic review tool, if applicable. No additional management support is needed unless otherwise documented below in the visit note.  Take 1 1/2 tablets today and then resume your normal dose tonight of 1/2 tablet all days except 1 tablet on Tuesday and Saturdays.  Re-check in 6 weeks.

## 2020-12-25 ENCOUNTER — Other Ambulatory Visit: Payer: Self-pay | Admitting: Family Medicine

## 2020-12-25 DIAGNOSIS — M62838 Other muscle spasm: Secondary | ICD-10-CM

## 2021-01-03 ENCOUNTER — Other Ambulatory Visit: Payer: Self-pay | Admitting: Nurse Practitioner

## 2021-01-15 ENCOUNTER — Other Ambulatory Visit: Payer: Self-pay

## 2021-01-15 ENCOUNTER — Ambulatory Visit (INDEPENDENT_AMBULATORY_CARE_PROVIDER_SITE_OTHER): Payer: Medicare Other | Admitting: General Practice

## 2021-01-15 DIAGNOSIS — Z7901 Long term (current) use of anticoagulants: Secondary | ICD-10-CM | POA: Diagnosis not present

## 2021-01-15 DIAGNOSIS — Z86718 Personal history of other venous thrombosis and embolism: Secondary | ICD-10-CM

## 2021-01-15 LAB — POCT INR: INR: 2.2 (ref 2.0–3.0)

## 2021-01-15 NOTE — Patient Instructions (Addendum)
Pre visit review using our clinic review tool, if applicable. No additional management support is needed unless otherwise documented below in the visit note.  Continue to take 1/2 tablet all days except 1 tablet on Tuesday and Saturdays.  Re-check in 6 weeks.

## 2021-01-15 NOTE — Progress Notes (Signed)
Medical screening examination/treatment/procedure(s) were performed by non-physician practitioner and as supervising physician I was immediately available for consultation/collaboration. I agree with above. Srihaan Desirre Eickhoff, MD   

## 2021-01-18 ENCOUNTER — Emergency Department (HOSPITAL_BASED_OUTPATIENT_CLINIC_OR_DEPARTMENT_OTHER)
Admission: EM | Admit: 2021-01-18 | Discharge: 2021-01-18 | Disposition: A | Discharge status: Home / Self Care | Payer: Medicare Other | Attending: Emergency Medicine | Admitting: Emergency Medicine

## 2021-01-18 ENCOUNTER — Encounter (HOSPITAL_BASED_OUTPATIENT_CLINIC_OR_DEPARTMENT_OTHER): Payer: Self-pay | Admitting: Emergency Medicine

## 2021-01-18 ENCOUNTER — Other Ambulatory Visit: Payer: Self-pay

## 2021-01-18 ENCOUNTER — Emergency Department (HOSPITAL_BASED_OUTPATIENT_CLINIC_OR_DEPARTMENT_OTHER): Payer: Medicare Other

## 2021-01-18 DIAGNOSIS — J45909 Unspecified asthma, uncomplicated: Secondary | ICD-10-CM | POA: Insufficient documentation

## 2021-01-18 DIAGNOSIS — Z79899 Other long term (current) drug therapy: Secondary | ICD-10-CM | POA: Insufficient documentation

## 2021-01-18 DIAGNOSIS — R251 Tremor, unspecified: Secondary | ICD-10-CM | POA: Diagnosis not present

## 2021-01-18 DIAGNOSIS — R059 Cough, unspecified: Secondary | ICD-10-CM | POA: Diagnosis not present

## 2021-01-18 DIAGNOSIS — Z87891 Personal history of nicotine dependence: Secondary | ICD-10-CM | POA: Diagnosis not present

## 2021-01-18 DIAGNOSIS — S299XXA Unspecified injury of thorax, initial encounter: Secondary | ICD-10-CM | POA: Diagnosis present

## 2021-01-18 DIAGNOSIS — Z7901 Long term (current) use of anticoagulants: Secondary | ICD-10-CM | POA: Insufficient documentation

## 2021-01-18 DIAGNOSIS — W19XXXA Unspecified fall, initial encounter: Secondary | ICD-10-CM

## 2021-01-18 DIAGNOSIS — S20211A Contusion of right front wall of thorax, initial encounter: Secondary | ICD-10-CM | POA: Diagnosis not present

## 2021-01-18 DIAGNOSIS — I10 Essential (primary) hypertension: Secondary | ICD-10-CM | POA: Insufficient documentation

## 2021-01-18 DIAGNOSIS — R0781 Pleurodynia: Secondary | ICD-10-CM

## 2021-01-18 MED ORDER — LIDOCAINE 5 % EX PTCH: 1.0000 | MEDICATED_PATCH | CUTANEOUS | 0 refills | Status: DC

## 2021-01-18 MED ORDER — OXYCODONE HCL 5 MG PO TABS: 5.0000 mg | ORAL_TABLET | ORAL | 0 refills | Status: DC | PRN

## 2021-01-18 MED ORDER — CYCLOBENZAPRINE HCL 10 MG PO TABS: 10.0000 mg | ORAL_TABLET | Freq: Two times a day (BID) | ORAL | 0 refills | Status: DC | PRN

## 2021-01-18 NOTE — ED Provider Notes (Signed)
[facility] EMERGENCY DEPARTMENT Provider Note   CSN: [id number] Arrival date & time: 01/18/21  6295     History Chief Complaint  Patient presents with  . Fall    Patrick Hatfield is a 86 y.o. male.  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   Fell on Monday, was in martial arts for many years so thought could handle it Started coughing, worsening pain keeping him from sleeping last night.  Thought should have it checked.  Coughing, pain with deep breaths.  No shortness of breath. Pain has not been bad except at night when rolling over in bed.  Pain is located right breast area, right chest.    Did not hit head when fell, no headache, neck pain, denies numbness, weakness, difficulty talking or walking, visual changes or facial droop.  Had some superficial lacerations.  Has had tetanus vaccine in last 5 years.  No nausea or vomiting.  Cough seems to be improving but pain has been persistent. Cough began 2 days ago and is nonproductive. No fevers, chills or dyspnea.   2.3 INR earlier this week.  So far taking aleve for pain at night, taken 2 aleve twice.    Past Medical History:  Diagnosis Date  . Anxiety   . Arthritis    KNEES,ANKLES,HANDS  . Asthma    AS A TEENAGER  . Barrett's esophagus   . Benign essential tremor   . Clotting disorder ([place])    FACTOR 5  . Colitis in 20's  . Colon polyps   . Congenital deficiency of other clotting factors    factor 5  . Depression 2006   drug overdose  . Diverticulosis   . DVT (deep venous thrombosis) ([place]) 1991 & 2010   X 2 in context Factor 5 def  . Esophageal reflux   . Factor 5 Leiden mutation, heterozygous ([place])   . Gilbert's syndrome   . H/O ischemic bowel disease 2010   while off warfarin   . Herpes zoster 2010   facial  . Hiatal hernia   . Hx of  adenomatous colonic polyps 12/14/2014  . Hyperlipidemia   . Hypertension   . Insomnia   . Osteoporosis   . Personal history of colonic polyps 05/28/2007   adenomatous  . Proteinuria age 23  . Thrombosis of mesenteric vein ([place]) 07/12/2013   03/2009  . Umbilical hernia     Patient Active Problem List   Diagnosis Date Noted  . [name] diarrhea 12/28/2019  . Hearing loss 01/26/2018  . Long term (current) use of anticoagulants 05/15/2017  . Encounter for therapeutic drug monitoring 12/05/2016  . Insomnia 09/05/2015  . Diarrhea 09/05/2015  . Hx of adenomatous colonic polyps 12/14/2014  . Hematuria, gross 02/03/2014  . Thrombosis of mesenteric vein (HCC) 07/12/2013  . Thrombocytopenia, unspecified ([place]) 01/05/2013  . Gilbert syndrome 01/05/2013  . Unspecified adverse effect of unspecified drug, medicinal and biological substance 01/09/2012  . Barrett's esophagus 03/24/2011  . Factor 5 Leiden mutation, heterozygous ([place]) 03/21/2011  . Essential tremor 04/24/2009  . HYPERLIPIDEMIA 10/03/2008  . Essential hypertension 10/03/2008  . GERD 10/03/2008  . Fasting hyperglycemia 10/03/2008  . DVT, HX OF 10/03/2008    Past Surgical History:  Procedure Laterality Date  . CHOLECYSTECTOMY  05/16/2011    Dr Marlou Starks  . COLONOSCOPY  Dr Sharlett Iles  . CYSTOSCOPY     Neg  . EYE SURGERY Bilateral 10/16/2017   secondary catarct surgery.  Marland Kitchen KNEE ARTHROSCOPY Bilateral 2008, 2012   Dr.Aplington bilateral  . TONSILLECTOMY    . TRANSURETHRAL RESECTION OF PROSTATE      BPH;Dr Dahlstedt       Family History  Problem Relation Age of Onset  . Pancreatic cancer Father 84  . Ulcers Father   . Stroke Father 54  . Diabetes Father   . Coronary artery disease Mother   . Osteoporosis Mother   . HIV Brother   . Multiple sclerosis Sister   . Heart attack Paternal Grandfather        early 77s  . Heart attack Paternal Uncle        2 uncles in early 32s  . Aortic aneurysm Brother   . Colon  cancer Neg Hx   . Esophageal cancer Neg Hx   . Stomach cancer Neg Hx     Social History   Tobacco Use  . Smoking status: Former Smoker    Packs/day: 2.00    Years: 40.00    Pack years: 80.00    Types: Cigarettes    Quit date: 08/18/1994    Years since quitting: 26.4  . Smokeless tobacco: Never Used  . Tobacco comment: smoked 1956-1996, up to 2 ppd  Vaping Use  . Vaping Use: Never used  Substance Use Topics  . Alcohol use: Not Currently  . Drug use: No    Home Medications Prior to Admission medications   Medication Sig Start Date End Date Taking? Authorizing Provider  cyclobenzaprine (FLEXERIL) 10 MG tablet Take 1 tablet (10 mg total) by mouth 2 (two) times daily as needed for muscle spasms. 01/18/21  Yes Gareth Morgan, MD  lidocaine (LIDODERM) 5 % Place 1 patch onto the skin daily. Remove & Discard patch within 12 hours or as directed by MD 01/18/21  Yes Gareth Morgan, MD  oxyCODONE (ROXICODONE) 5 MG immediate release tablet Take 1 tablet (5 mg total) by mouth every 4 (four) hours as needed for severe pain. 01/18/21  Yes Gareth Morgan, MD  acetaminophen (TYLENOL) 325 MG tablet Per bottle as needed    [provider]  Al Hyd-Mg Tr-Alg Ac-Sod Bicarb (GAVISCON-2 PO) Take by mouth.    [provider]  cholecalciferol (VITAMIN D) 1000 UNITS tablet Take 1,000 Units by mouth daily.    [provider]  COVID-19 mRNA vaccine, Pfizer, 30 MCG/0.3ML injection INJECT AS DIRECTED 07/31/20 07/31/21  Carlyle Basques, MD  diazepam (VALIUM) 5 MG tablet TAKE 1/2 TO 1 TABLET BY MOUTH AT BEDTIME AS NEEDED FOR SLEEP 12/25/20   Copland, Gay Filler, MD  Fluticasone Propionate (FLONASE NA) Place into the nose as needed.    [provider]  folic acid (FOLVITE) 030 MCG tablet Take 400 mcg by mouth daily.    [provider]  gabapentin (NEURONTIN) 300 MG capsule TAKE 1 OR 2 CAPSULES BY MOUTH AT BEDTIME AS NEEDED 04/19/20   Copland, Gay Filler, MD  LINZESS 145 MCG  CAPS capsule TAKE 1 CAPSULE BY MOUTH DAILY BEFORE BREAKFAST. 01/03/21   Noralyn Pick, NP  losartan-hydrochlorothiazide (HYZAAR) 100-12.5 MG tablet TAKE 1 TABLET BY MOUTH EVERY DAY 09/28/20   Copland, Gay Filler, MD  Omega-3 Fatty Acids (FISH OIL) 1200 MG CAPS Take 2,400 mg by mouth daily.    [provider]  propranolol ER (INDERAL LA) 80 MG 24 hr capsule Take 1 capsule (80  mg total) by mouth daily. 07/26/20   Copland, Gay Filler, MD  simvastatin (ZOCOR) 10 MG tablet Take 1 tablet (10 mg total) by mouth at bedtime. 07/31/20   Copland, Gay Filler, MD  warfarin (COUMADIN) 5 MG tablet TAKE AS DIRECTED BY ANTICOAGULATION CLINIC 04/09/20   Copland, Gay Filler, MD    Allergies    Amlodipine besy-benazepril hcl and Lipitor [atorvastatin calcium]  Review of Systems   Review of Systems  Constitutional: Negative for fever.  HENT: Positive for rhinorrhea (chronic). Negative for congestion and sore throat.   Eyes: Negative for visual disturbance.  Respiratory: Positive for cough (nonproductive). Negative for shortness of breath.   Cardiovascular: Positive for chest pain.  Gastrointestinal: Negative for abdominal pain, nausea and vomiting.  Genitourinary: Negative for difficulty urinating and dysuria.  Musculoskeletal: Negative for back pain, neck pain and neck stiffness.  Skin: Negative for rash.  Neurological: Negative for syncope, facial asymmetry, speech difficulty, weakness, light-headedness, numbness and headaches.    Physical Exam Updated Vital Signs BP 136/67 (BP Location: Right Arm)   Pulse (!) 58   Temp 97.8 F (36.6 C) (Oral)   Resp 18   Wt 94.5 kg   SpO2 99%   BMI 29.91 kg/m   Physical Exam Vitals and nursing note reviewed.  Constitutional:      General: He is not in acute distress.    Appearance: He is well-developed. He is not diaphoretic.  HENT:     Head: Normocephalic and atraumatic.  Eyes:     Conjunctiva/sclera: Conjunctivae normal.  Cardiovascular:      Rate and Rhythm: Normal rate and regular rhythm.     Heart sounds: Normal heart sounds. No murmur heard. No friction rub. No gallop.   Pulmonary:     Effort: Pulmonary effort is normal. No respiratory distress.     Breath sounds: Normal breath sounds. No wheezing or rales.  Chest:     Chest wall: Tenderness (right chest wall) present.  Abdominal:     General: There is no distension.     Palpations: Abdomen is soft.     Tenderness: There is no abdominal tenderness. There is no guarding.  Musculoskeletal:     Cervical back: Normal range of motion.  Skin:    General: Skin is warm and dry.  Neurological:     Mental Status: He is alert and oriented to person, place, and time.     GCS: GCS eye subscore is 4. GCS verbal subscore is 5. GCS motor subscore is 6.     Cranial Nerves: Cranial nerves are intact. No cranial nerve deficit, dysarthria or facial asymmetry.     Sensory: Sensation is intact. No sensory deficit.     Motor: Tremor present. No weakness.     ED Results / Procedures / Treatments   Labs (all labs ordered are listed, but only abnormal results are displayed) Labs Reviewed - No data to display  EKG None  Radiology DG Chest 2 View  Result Date: 01/18/2021 CLINICAL DATA:  86 year old male status post fall 4 days ago. On Coumadin. Cough. EXAM: CHEST - 2 VIEW COMPARISON:  Portable chest 04/27/2020 and earlier. FINDINGS: Lung volumes and mediastinal contours remain within normal limits. Visualized tracheal air column is within normal limits. No pneumothorax, pulmonary edema, pleural effusion or confluent pulmonary opacity. Negative visible bowel gas. Right upper quadrant cholecystectomy clips. No acute osseous abnormality identified. IMPRESSION: No acute cardiopulmonary abnormality or acute traumatic injury identified. Electronically Signed   By: Herminio Heads.D.  On: 01/18/2021 09:59    Procedures Procedures   Medications Ordered in ED Medications - No data to display  ED  Course  I have reviewed the triage vital signs and the nursing notes.  Pertinent labs & imaging results that were available during my care of the patient were reviewed by me and considered in my medical decision making (see chart for details).    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  Given fall occurred 5 days ago without head trauma, no headache, no signs of head trauma or neurologic abnormalities nausea nor vomiting discussed with patient and have low suspicion for intracranial bleed and feel it is reasonable to forego imaging.  Denies injuries other than to right chest and superficial lacerations which appear clean and healing appropriately.  Despite age do not feel CT imaging of chest will change acute course of care at this time given duration since injury.  CXR completed showing no acute abnormalities, possible occult rib fracture or rib contusion.  Given spirometer and medications for pain control including flexeril, lidocaine, and after review in [place] drug database, 5 tablets of oxycodone for breakthrough pain. Patient discharged in stable condition with understanding of reasons to return.     Final Clinical Impression(s) / ED Diagnoses Final diagnoses:  Fall, initial encounter  Rib pain on right side  Contusion of rib on right side, initial encounter    Rx / DC Orders ED Discharge Orders         Ordered    lidocaine (LIDODERM) 5 %  Every 24 hours        01/18/21 1014    cyclobenzaprine (FLEXERIL) 10 MG tablet  2 times daily PRN        01/18/21 1014    oxyCODONE (ROXICODONE) 5 MG immediate release tablet  Every 4 hours PRN        01/18/21 1014           Gareth Morgan, MD 01/18/21 1018

## 2021-01-18 NOTE — ED Triage Notes (Signed)
Pt fell on Monday, injured right ribs.  No head injury, no LOC.  Pt has very small laceration to left arm.  Pt is on coumadin.

## 2021-01-18 NOTE — Discharge Instructions (Signed)
Take Tylenol 1000 mg 4 times a day for 1 week. This is the maximum dose of Tylenol (acetaminophen) you can  take from all sources. Please check other over-the-counter medications and prescriptions to ensure you are not taking other medications that contain acetaminophen.  Take oxycodone as needed for breakthrough pain.  This medication can be addicting, sedating and cause constipation.

## 2021-01-24 ENCOUNTER — Ambulatory Visit: Payer: Medicare Other | Admitting: Family Medicine

## 2021-01-26 NOTE — Progress Notes (Addendum)
[facility] [place], [place], [place] [address] [phone]  Date:  01/31/2021   Name:  Patrick Hatfield   DOB:  [birth year]   MRN:  [id number]  PCP:  Darreld Mclean, MD    Chief Complaint: Hypertension (6 month follow up/), Hyperlipidemia, and Other (Rib pain, right side, negative xray)   History of Present Illness:  Patrick Hatfield is a 86 y.o. very pleasant male patient who presents with the following:  Here today for follow-up visit-history of factor V Leiden, DVT, mesenteric vein thrombosis currently on Coumadin, essential tremor, hypertension, hyperlipidemia Most recent visit with myself was in April-at that time he had noted some digestive changes and has lost a few pounds.  However, he thought weight loss was due to dietary changes  He was also seen in the ER on June 3 after a fall.  He was walking back inside from his patio with both hands well, he tripped and fell -He was evaluated and found to be okay, released to home No major injury noted- however he continues to have some right rib pain which he injured in the fall.  He has pain only if he coughs hard or rolls on his right side at night-he does note this is getting better He did not take any rx meds from the ER  He is using lidocaine patches  At the ER, he was found to have regained the weight he had lost previously  Wt Readings from Last 3 Encounters:  01/31/21 204 lb (92.5 kg)  01/18/21 208 lb 7 oz (94.5 kg)  11/19/20 200 lb (90.7 kg)   COVID-19 booster- he plans to do the 4th dose  Can offer to update PSA today if he would like  Diazepam as needed Gabapentin Linzess Losartan/HCTZ Propranolol Simvastatin Coumadin  Lab Results  Component Value Date   INR 2.2 01/15/2021   INR 1.9 (A) 11/27/2020   INR 1.0 (A) 11/08/2020   PROTIME 22.8 (H) 07/11/2013   PROTIME 30.0 (H) 07/13/2012   PROTIME 16.8 (H) 05/19/2011    Past Medical History:   Diagnosis Date   Anxiety    Arthritis    KNEES,ANKLES,HANDS   Asthma    AS A TEENAGER   Barrett's esophagus    Benign essential tremor    Clotting disorder (HCC)    FACTOR 5   Colitis in 20's   Colon polyps    Congenital deficiency of other clotting factors    factor 5   Depression 2006   drug overdose   Diverticulosis    DVT (deep venous thrombosis) ([place]) 1991 & 2010   X 2 in context Factor 5 def   Esophageal reflux    Factor 5 Leiden mutation, heterozygous ([place])    Gilbert's syndrome    H/O ischemic bowel disease 2010   while off warfarin    Herpes zoster 2010   facial   Hiatal hernia    Hx of adenomatous colonic polyps 12/14/2014   Hyperlipidemia    Hypertension    Insomnia    Osteoporosis    Personal history of colonic polyps 05/28/2007   adenomatous   Proteinuria age 43   Thrombosis of mesenteric vein ([facility]) 56/43/3295   08/8839   Umbilical hernia     Past Surgical History:  Procedure Laterality Date   CHOLECYSTECTOMY  05/16/2011    Dr Marlou Starks   COLONOSCOPY     Dr Sharlett Iles  CYSTOSCOPY     Neg   EYE SURGERY Bilateral 10/16/2017   secondary catarct surgery.   KNEE ARTHROSCOPY Bilateral 2008, 2012   Dr.Aplington bilateral   TONSILLECTOMY     TRANSURETHRAL RESECTION OF PROSTATE      BPH;Dr Dahlstedt    Social History   Tobacco Use   Smoking status: Former    Packs/day: 2.00    Years: 40.00    Pack years: 80.00    Types: Cigarettes    Quit date: 08/18/1994    Years since quitting: 26.4   Smokeless tobacco: Never   Tobacco comments:    smoked 1956-1996, up to 2 ppd  Vaping Use   Vaping Use: Never used  Substance Use Topics   Alcohol use: Not Currently   Drug use: No    Family History  Problem Relation Age of Onset   Pancreatic cancer Father 66   Ulcers Father    Stroke Father 50   Diabetes Father    Coronary artery disease Mother    Osteoporosis Mother    HIV Brother    Multiple sclerosis Sister    Heart attack Paternal Grandfather         early 54s   Heart attack Paternal Uncle        2 uncles in early 1s   Aortic aneurysm Brother    Colon cancer Neg Hx    Esophageal cancer Neg Hx    Stomach cancer Neg Hx     Allergies  Allergen Reactions   Amlodipine Besy-Benazepril Hcl     REACTION: swelling of feet.  Probably  from Amlodipine component)   Lipitor [Atorvastatin Calcium]     ? Reaction, ? Swelling=- update 7/20.  Pt recalls his legs hurt and perhaps swelled slightly - JC    Medication list has been reviewed and updated.  Current Outpatient Medications on File Prior to Visit  Medication Sig Dispense Refill   acetaminophen (TYLENOL) 325 MG tablet Per bottle as needed     Al Hyd-Mg Tr-Alg Ac-Sod Bicarb (GAVISCON-2 PO) Take by mouth.     cholecalciferol (VITAMIN D) 1000 UNITS tablet Take 1,000 Units by mouth daily.     COVID-19 mRNA vaccine, Pfizer, 30 MCG/0.3ML injection INJECT AS DIRECTED .3 mL 0   diazepam (VALIUM) 5 MG tablet TAKE 1/2 TO 1 TABLET BY MOUTH AT BEDTIME AS NEEDED FOR SLEEP 30 tablet 3   Fluticasone Propionate (FLONASE NA) Place into the nose as needed.     folic acid (FOLVITE) 932 MCG tablet Take 400 mcg by mouth daily.     gabapentin (NEURONTIN) 300 MG capsule TAKE 1 OR 2 CAPSULES BY MOUTH AT BEDTIME AS NEEDED 180 capsule 1   lidocaine (LIDODERM) 5 % Place 1 patch onto the skin daily. Remove & Discard patch within 12 hours or as directed by MD 30 patch 0   LINZESS 145 MCG CAPS capsule TAKE 1 CAPSULE BY MOUTH DAILY BEFORE BREAKFAST. 30 capsule 0   losartan-hydrochlorothiazide (HYZAAR) 100-12.5 MG tablet TAKE 1 TABLET BY MOUTH EVERY DAY 90 tablet 2   Omega-3 Fatty Acids (FISH OIL) 1200 MG CAPS Take 2,400 mg by mouth daily.     propranolol ER (INDERAL LA) 80 MG 24 hr capsule Take 1 capsule (80 mg total) by mouth daily. 90 capsule 3   simvastatin (ZOCOR) 10 MG tablet Take 1 tablet (10 mg total) by mouth at bedtime. 90 tablet 3   warfarin (COUMADIN) 5 MG tablet TAKE AS DIRECTED BY ANTICOAGULATION  CLINIC 120  tablet 3   No current facility-administered medications on file prior to visit.    Review of Systems:  As per HPI- otherwise negative.   Physical Examination: Vitals:   01/31/21 1058  BP: 122/80  Pulse: 77  Resp: 16  Temp: 97.8 F (36.6 C)  SpO2: 98%   Vitals:   01/31/21 1058  Weight: 204 lb (92.5 kg)  Height: 5\' 10"  (1.778 m)   Body mass index is 29.27 kg/m. Ideal Body Weight: Weight in (lb) to have BMI = 25: 173.9  GEN: no acute distress. Overweight, looks well  HEENT: Atraumatic, Normocephalic.  Ears and Nose: No external deformity. CV: RRR, No M/G/R. No JVD. No thrill. No extra heart sounds. PULM: CTA B, no wheezes, crackles, rhonchi. No retractions. No resp. distress. No accessory muscle use. ABD: S, NT, ND, +BS. No rebound. No HSM. EXTR: No c/c/e PSYCH: Normally interactive. Conversant.  Right ribs not tender at this time   Assessment and Plan: Essential hypertension - Plan: Basic metabolic panel  Dyslipidemia  Factor 5 Leiden mutation, heterozygous ([place]) - Plan: CBC  Screening for prostate cancer - Plan: PSA  Fall in home, subsequent encounter  Following up today.  Blood pressures under good control, continue current medication Follow-up on CBC today PSA pending Lipid profile is up-to-date Encouraged COVID-19 fourth dose He had a trip and fall, no serious injury.  I offered to repeat films today in case there is an occult rib fracture.  He declines for now, he feels that he is improving  Plan to recheck in 6 months This visit occurred during the SARS-CoV-2 public health emergency.  Safety protocols were in place, including screening questions prior to the visit, additional usage of staff PPE, and extensive cleaning of exam room while observing appropriate contact time as indicated for disinfecting solutions.   Signed Lamar Blinks, MD   Received his labs as below, message to patient  Results for orders placed or performed in visit  on 01/31/21  PSA  Result Value Ref Range   PSA 2.41 0.10 - 4.00 ng/mL  Basic metabolic panel  Result Value Ref Range   Sodium 138 135 - 145 mEq/L   Potassium 3.9 3.5 - 5.1 mEq/L   Chloride 102 96 - 112 mEq/L   CO2 27 19 - 32 mEq/L   Glucose, Bld 108 (H) 70 - 99 mg/dL   BUN 16 6 - 23 mg/dL   Creatinine, Ser 0.88 0.40 - 1.50 mg/dL   GFR 79.08 >60.00 mL/min   Calcium 9.8 8.4 - 10.5 mg/dL  CBC  Result Value Ref Range   WBC 7.3 4.0 - 10.5 K/uL   RBC 4.90 4.22 - 5.81 Mil/uL   Platelets 135.0 (L) 150.0 - 400.0 K/uL   Hemoglobin 15.7 13.0 - 17.0 g/dL   HCT 46.8 39.0 - 52.0 %   MCV 95.5 78.0 - 100.0 fl   MCHC 33.7 30.0 - 36.0 g/dL   RDW 14.3 11.5 - 15.5 %

## 2021-01-28 DIAGNOSIS — H527 Unspecified disorder of refraction: Secondary | ICD-10-CM | POA: Diagnosis not present

## 2021-01-31 ENCOUNTER — Encounter: Payer: Self-pay | Admitting: Family Medicine

## 2021-01-31 ENCOUNTER — Ambulatory Visit (INDEPENDENT_AMBULATORY_CARE_PROVIDER_SITE_OTHER): Payer: Medicare Other | Admitting: Family Medicine

## 2021-01-31 ENCOUNTER — Other Ambulatory Visit: Payer: Self-pay

## 2021-01-31 VITALS — BP 122/80 | HR 77 | Temp 97.8°F | Resp 16 | Ht 70.0 in | Wt 204.0 lb

## 2021-01-31 DIAGNOSIS — Y92009 Unspecified place in unspecified non-institutional (private) residence as the place of occurrence of the external cause: Secondary | ICD-10-CM | POA: Diagnosis not present

## 2021-01-31 DIAGNOSIS — E785 Hyperlipidemia, unspecified: Secondary | ICD-10-CM | POA: Diagnosis not present

## 2021-01-31 DIAGNOSIS — W19XXXD Unspecified fall, subsequent encounter: Secondary | ICD-10-CM

## 2021-01-31 DIAGNOSIS — Z125 Encounter for screening for malignant neoplasm of prostate: Secondary | ICD-10-CM | POA: Diagnosis not present

## 2021-01-31 DIAGNOSIS — D6851 Activated protein C resistance: Secondary | ICD-10-CM

## 2021-01-31 DIAGNOSIS — I10 Essential (primary) hypertension: Secondary | ICD-10-CM | POA: Diagnosis not present

## 2021-01-31 LAB — CBC
HCT: 46.8 % (ref 39.0–52.0)
Hemoglobin: 15.7 g/dL (ref 13.0–17.0)
MCHC: 33.7 g/dL (ref 30.0–36.0)
MCV: 95.5 fl (ref 78.0–100.0)
Platelets: 135 10*3/uL — ABNORMAL LOW (ref 150.0–400.0)
RBC: 4.9 Mil/uL (ref 4.22–5.81)
RDW: 14.3 % (ref 11.5–15.5)
WBC: 7.3 10*3/uL (ref 4.0–10.5)

## 2021-01-31 LAB — BASIC METABOLIC PANEL
BUN: 16 mg/dL (ref 6–23)
CO2: 27 mEq/L (ref 19–32)
Calcium: 9.8 mg/dL (ref 8.4–10.5)
Chloride: 102 mEq/L (ref 96–112)
Creatinine, Ser: 0.88 mg/dL (ref 0.40–1.50)
GFR: 79.08 mL/min (ref >60.00)
Glucose, Bld: 108 mg/dL — ABNORMAL HIGH (ref 70–99)
Potassium: 3.9 mEq/L (ref 3.5–5.1)
Sodium: 138 mEq/L (ref 135–145)

## 2021-01-31 LAB — PSA: PSA: 2.41 ng/mL (ref 0.10–4.00)

## 2021-01-31 NOTE — Patient Instructions (Signed)
Good to see you again today!    Get 4th dose of covid soon I will be in touch with your labs Assuming all is well please see me in 6 months

## 2021-02-01 ENCOUNTER — Other Ambulatory Visit: Payer: Self-pay | Admitting: Nurse Practitioner

## 2021-02-26 ENCOUNTER — Ambulatory Visit (INDEPENDENT_AMBULATORY_CARE_PROVIDER_SITE_OTHER): Payer: Medicare Other | Admitting: General Practice

## 2021-02-26 ENCOUNTER — Other Ambulatory Visit: Payer: Self-pay

## 2021-02-26 DIAGNOSIS — Z86718 Personal history of other venous thrombosis and embolism: Secondary | ICD-10-CM | POA: Diagnosis not present

## 2021-02-26 DIAGNOSIS — Z7901 Long term (current) use of anticoagulants: Secondary | ICD-10-CM | POA: Diagnosis not present

## 2021-02-26 LAB — POCT INR: INR: 2 (ref 2.0–3.0)

## 2021-02-26 NOTE — Patient Instructions (Addendum)
Pre visit review using our clinic review tool, if applicable. No additional management support is needed unless otherwise documented below in the visit note.  Continue to take 1/2 tablet all days except 1 tablet on Tuesday and Saturdays.  Re-check in 6 weeks.

## 2021-02-26 NOTE — Progress Notes (Signed)
Medical screening examination/treatment/procedure(s) were performed by non-physician practitioner and as supervising physician I was immediately available for consultation/collaboration. I agree with above. Rayven Vidya Bamford, MD   

## 2021-03-06 ENCOUNTER — Telehealth: Payer: Self-pay | Admitting: Pharmacist

## 2021-03-06 NOTE — Chronic Care Management (AMB) (Signed)
Chronic Care Management Pharmacy Assistant   Name: Patrick Hatfield  MRN: [id number] DOB: Apr 10, 1935  Reason for Encounter: General Adherence Call  Recent office visits:  01/31/21- Lamar Blinks, MD- seen for 6 month follow up of hypertension, labs ordered, no medication changes, follow up 6 months  11/19/20- Lamar Blinks, MD- seen for follow up abdominal pain, no medication changes, no follow up documented 11/07/20-  Patient message- Dr. Lorelei Pont prescribed colchicine 0.6 mg take two pills by mouth, 1 pill an hour later  Recent consult visits:  11/09/20- Orders Only (Gastro)- Linzess 145 mcg daily before breakfast  Hospital visits:  Medication Reconciliation was completed by comparing discharge summary, patient's EMR and Pharmacy list, and upon discussion with patient.  Same day discharge from [facility] Emergency Department on 01/18/21 due to fall  New?Medications Started at [facility] Discharge:?? Lidocaine 5%  Cyclobenzaprine 10 mg twice daily as needed Oxycodone 5 mg every 4 hours prn   All other medications remain the same after Hospital Discharge  Medications: Outpatient Encounter Medications as of 03/06/2021  Medication Sig Note   acetaminophen (TYLENOL) 325 MG tablet Per bottle as needed 03/30/2020: As needed   Al Hyd-Mg Tr-Alg Ac-Sod Bicarb (GAVISCON-2 PO) Take by mouth. 03/30/2020: As needed   cholecalciferol (VITAMIN D) 1000 UNITS tablet Take 1,000 Units by mouth daily.    COVID-19 mRNA vaccine, Pfizer, 30 MCG/0.3ML injection INJECT AS DIRECTED    diazepam (VALIUM) 5 MG tablet TAKE 1/2 TO 1 TABLET BY MOUTH AT BEDTIME AS NEEDED FOR SLEEP    Fluticasone Propionate (FLONASE NA) Place into the nose as needed. 8/78/6767: As needed   folic acid (FOLVITE) 209 MCG tablet Take 400 mcg by mouth daily.    gabapentin (NEURONTIN) 300 MG capsule TAKE 1 OR 2 CAPSULES BY MOUTH AT BEDTIME AS NEEDED    lidocaine (LIDODERM) 5 % Place 1 patch onto the skin daily. Remove & Discard  patch within 12 hours or as directed by MD    LINZESS 145 MCG CAPS capsule TAKE 1 CAPSULE BY MOUTH EVERY DAY BEFORE BREAKFAST    losartan-hydrochlorothiazide (HYZAAR) 100-12.5 MG tablet TAKE 1 TABLET BY MOUTH EVERY DAY    Omega-3 Fatty Acids (FISH OIL) 1200 MG CAPS Take 2,400 mg by mouth daily.    propranolol ER (INDERAL LA) 80 MG 24 hr capsule Take 1 capsule (80 mg total) by mouth daily.    simvastatin (ZOCOR) 10 MG tablet Take 1 tablet (10 mg total) by mouth at bedtime.    warfarin (COUMADIN) 5 MG tablet TAKE AS DIRECTED BY ANTICOAGULATION CLINIC    No facility-administered encounter medications on file as of 03/06/2021.   Have you had any problems recently with your health? Patient stated he has not had any recent problems with his health   Have you had any problems with your pharmacy? Patient stated he currently receives a 30 day supply of Linzess for $45 which is a bit high for him  What issues or side effects are you having with your medications? Patient stated that he experiences very loose stools for the entire day while taking Linzess. He has stopped taking Linzess daily to combat this, but stated that when he doesn't take it his stools are irregular. He would like advice on what if any changes so be made to his Linzess.  What would you like me to pass along to [employer] ,CPP for them to help you with?  Patient would like help managing side effects and cost for Linzess  What can we do to take care of you better? Patient did not have any suggestions   Star Rating Drugs: Losartan- HCTZ 100-12.5 mg- 90 DS last filled 12/25/20 Simvastatin 10 mg- 90 DS last filled 01/29/21  Wilford Sports CPA, CMA

## 2021-03-07 NOTE — Telephone Encounter (Signed)
Patient can try taking Linzess every other day or as needed for constipation.  Patient has follow up with pharmacist already scheduled for 04/04/2021 but please contact sooner if loose stools / constipation not improving.

## 2021-03-08 NOTE — Telephone Encounter (Cosign Needed)
I spoke with Mr. Patrick Hatfield and gave him the recommendations made by CPP. He informed me that he has tried taking Linzess every other day and it causes a hindrance on his activity. He now takes Linzess 2-3 times weekly which gives him ok results. He would like to discuss possibly decreasing dosage at next CPP follow up  so he may take this daily   Wilford Sports CPA,CMA

## 2021-04-04 ENCOUNTER — Ambulatory Visit (INDEPENDENT_AMBULATORY_CARE_PROVIDER_SITE_OTHER): Payer: Medicare Other | Admitting: Pharmacist

## 2021-04-04 ENCOUNTER — Telehealth: Payer: Self-pay | Admitting: Nurse Practitioner

## 2021-04-04 DIAGNOSIS — E785 Hyperlipidemia, unspecified: Secondary | ICD-10-CM

## 2021-04-04 DIAGNOSIS — I10 Essential (primary) hypertension: Secondary | ICD-10-CM | POA: Diagnosis not present

## 2021-04-04 DIAGNOSIS — K5909 Other constipation: Secondary | ICD-10-CM

## 2021-04-04 DIAGNOSIS — D6851 Activated protein C resistance: Secondary | ICD-10-CM

## 2021-04-04 NOTE — Chronic Care Management (AMB) (Signed)
Chronic Care Management Pharmacy Note  04/07/2021 Name:  ARIEZ NEILAN MRN:  [id number] DOB:  08/22/1934  Subjective: MASYN ROSTRO is an 86 y.o. year old male who is a primary patient of Copland, Gay Filler, MD.  The CCM team was consulted for assistance with disease management and care coordination needs.    Engaged with patient by telephone for follow up visit in response to provider referral for pharmacy case management and/or care coordination services.   Consent to Services:  The patient was given information about Chronic Care Management services, agreed to services, and gave verbal consent prior to initiation of services.  Please see initial visit note for detailed documentation.   Patient Care Team: Copland, Gay Filler, MD as PCP - General (Family Medicine) Latanya Maudlin, MD as Consulting Physician (Orthopedic Surgery) Gatha Mayer, MD as Consulting Physician (Gastroenterology) Marshell Garfinkel, MD as Consulting Physician (Pulmonary Disease) Cherre Robins, PharmD (Pharmacist)  Recent office visits: 01/31/21- Lamar Blinks, MD- seen for 6 month follow up of hypertension, labs ordered, no medication changes, follow up 6 months  11/19/20- Lamar Blinks, MD- seen for follow up abdominal pain, no medication changes, no follow up documented   Recent consult visit: 02/26/2021 - Anticoagulation clinic Luciana Axe, RN) Anticoga diagnosis: Factor V deficiency.  INR was 2.0. Continued warfarin 43m on Tuesdays; 2.518mall other days. F/U 6 weeks.  11/09/20- Orders Only (Gastro)- Linzess 145 mcg daily before breakfast  Hospital visits: [facility]18[facility]2021 [facility]  Cyclobenzaprine 10 mg twice daily as needed Oxycodone 5 mg every 4 hours prn  All other medications remain the same after Hospital Discharge  Objective:  Lab Results  Component Value Date   CREATININE 0.88 01/31/2021   CREATININE  0.86 07/26/2020   CREATININE 0.73 12/05/2019    Lab Results  Component Value Date   HGBA1C 5.2 03/02/2019   Last diabetic Eye exam: No results found for: HMDIABEYEEXA  Last diabetic Foot exam: No results found for: HMDIABFOOTEX      Component Value Date/Time   CHOL 176 07/26/2020 1129   TRIG 267.0 (H) 07/26/2020 1129   HDL 36.10 (L) 07/26/2020 1129   CHOLHDL 5 07/26/2020 1129   VLDL 53.4 (H) 07/26/2020 1129   LDLCALC 164 (H) 03/02/2019 1049   LDLDIRECT 96.0 07/26/2020 1129    Hepatic Function Latest Ref Rng & Units 07/26/2020 12/05/2019 11/22/2019  Total Protein 6.0 - 8.3 g/dL 7.6 7.3 8.0  Albumin 3.5 - 5.2 g/dL 4.3 3.7 3.7  AST 0 - 37 U/L _0 ALT 0 - 53 U/L _1 Alk Phosphatase 39 - 117 U/L 49 64 53  Total Bilirubin 0.2 - 1.2 mg/dL 2.3(H) 1.5(H) 2.7(H)  Bilirubin, Direct 0.0 - 0.3 mg/dL - - -    Lab Results  Component Value Date/Time   TSH 1.54 03/02/2019 10:49 AM   TSH 1.63 02/08/2014 10:12 AM    CBC Latest Ref Rng & Units 01/31/2021 07/26/2020 12/05/2019  WBC 4.0 - 10.5 K/uL 7.3 6.4 10.9(H)  Hemoglobin 13.0 - 17.0 g/dL 15.7 15.1 15.0  Hematocrit 39.0 - 52.0 % 46.8 44.7 44.9  Platelets 150.0 - 400.0 K/uL 135.0(L) 124.0(L) 197.0    No results found for: VD25OH  Clinical ASCVD: No  The ASCVD Risk score (GMikey BussingC Jr., et al., 2013) failed to calculate for the following reasons:   The 2013 ASCVD risk score is only valid for  ages 48 to 29     Social History   Tobacco Use  Smoking Status Former   Packs/day: 2.00   Years: 40.00   Pack years: 80.00   Types: Cigarettes   Quit date: 08/18/1994   Years since quitting: 26.6  Smokeless Tobacco Never  Tobacco Comments   smoked 1956-1996, up to 2 ppd   BP Readings from Last 3 Encounters:  01/31/21 122/80  01/18/21 136/67  11/19/20 124/68   Pulse Readings from Last 3 Encounters:  01/31/21 77  01/18/21 (!) 58  11/19/20 70   Wt Readings from Last 3 Encounters:  01/31/21 204 lb (92.5 kg)  01/18/21 208 lb  7 oz (94.5 kg)  11/19/20 200 lb (90.7 kg)    Assessment: Review of patient past medical history, allergies, medications, health status, including review of consultants reports, laboratory and other test data, was performed as part of comprehensive evaluation and provision of chronic care management services.   SDOH:  (Social Determinants of Health) assessments and interventions performed:  SDOH Interventions    Flowsheet Row Most Recent Value  SDOH Interventions   Financial Strain Interventions Intervention Not Indicated       CCM Care Plan  Allergies  Allergen Reactions   Amlodipine Besy-Benazepril Hcl     REACTION: swelling of feet.  Probably  from Amlodipine component)   Lipitor [Atorvastatin Calcium]     ? Reaction, ? Swelling=- update 7/20.  Pt recalls his legs hurt and perhaps swelled slightly - JC    Medications Reviewed Today     Reviewed by Cherre Robins, PharmD (Pharmacist) on 04/04/21 at 1329  Med List Status: <None>   Medication Order Taking? Sig Documenting Provider Last Dose Status Informant  acetaminophen (TYLENOL) 325 MG tablet [id number] Yes Per bottle as needed [provider] Taking Active            Med Note [name]   Fri Mar 30, 2020 11:11 AM) As needed  Al Hyd-Mg Tr-Alg Ac-Sod Bicarb (GAVISCON-2 PO) [id number] Yes Take 0.5-1 tablets by mouth as needed. [provider] Taking Active            Med Note [name]   Fri Mar 30, 2020 11:11 AM) As needed  cholecalciferol (VITAMIN D) 1000 UNITS tablet [id number] Yes Take 1,000 Units by mouth daily. [provider] Taking Active   diazepam (VALIUM) 5 MG tablet [id number] Yes TAKE 1/2 TO 1 TABLET BY MOUTH AT BEDTIME AS NEEDED FOR SLEEP Copland, Gay Filler, MD Taking Active   Fluticasone Propionate (FLONASE NA) [id number] Yes Place into the nose as needed. [provider] Taking Active            Med Note [name]   Fri Mar 30, 2020 11:13 AM) As needed  folic acid  (FOLVITE) 762 MCG tablet [id number] Yes Take 400 mcg by mouth daily. [provider] Taking Active   gabapentin (NEURONTIN) 300 MG capsule [id number] No TAKE 1 OR 2 CAPSULES BY MOUTH AT BEDTIME AS NEEDED  Patient not taking: Reported on 04/04/2021   Copland, Gay Filler, MD Not Taking Active   LINZESS 145 MCG CAPS capsule [id number] Yes TAKE 1 CAPSULE BY MOUTH EVERY DAY BEFORE BREAKFAST Noralyn Pick, NP Taking Active   losartan-hydrochlorothiazide (HYZAAR) 100-12.5 MG tablet [id number] Yes TAKE 1 TABLET BY MOUTH EVERY DAY Copland, Gay Filler, MD Taking Active   Omega-3 Fatty Acids (FISH OIL) 1200 MG CAPS [id number] Yes Take 2,400 mg by mouth daily. [provider] Taking  Active   propranolol ER (INDERAL LA) 80 MG 24 hr capsule [id number] Yes Take 1 capsule (80 mg total) by mouth daily. Copland, Gay Filler, MD Taking Active   simvastatin (ZOCOR) 10 MG tablet [id number] Yes Take 1 tablet (10 mg total) by mouth at bedtime. Copland, Gay Filler, MD Taking Active   warfarin (COUMADIN) 5 MG tablet [id number] Yes TAKE AS DIRECTED BY ANTICOAGULATION CLINIC Copland, Gay Filler, MD Taking Active             Patient Active Problem List   Diagnosis Date Noted   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   Thrombocytopenia, unspecified ([place]) 01/05/2013   Rosanna Randy syndrome 01/05/2013   Unspecified adverse effect of unspecified drug, medicinal and biological substance 01/09/2012   Barrett's esophagus 03/24/2011   Factor 5 Leiden mutation, heterozygous ([place]) 03/21/2011   Essential tremor 04/24/2009   HYPERLIPIDEMIA 10/03/2008   Essential hypertension 10/03/2008   GERD 10/03/2008   Fasting  hyperglycemia 10/03/2008   DVT, HX OF 10/03/2008    Immunization History  Administered Date(s) Administered   DT (Pediatric) 02/16/2015   Fluad Quad(high Dose 65+) 05/10/2019, 05/17/2020   Influenza Split 05/03/2012   Influenza Whole 07/24/2010   Influenza, High Dose Seasonal PF 05/20/2013, 05/09/2016, 05/15/2017, 04/20/2018   Influenza,inj,Quad PF,6+ Mos 05/03/2014, 05/16/2015   PFIZER(Purple Top)SARS-COV-2 Vaccination 08/27/2019, 09/17/2019, 07/31/2020   Pneumococcal Conjugate-13 09/05/2015   Pneumococcal Polysaccharide-23 09/26/2016   Td 03/03/2015   Tdap 03/08/2015   Zoster Recombinat (Shingrix) 02/22/2018, 10/05/2018    Conditions to be addressed/monitored: HTN, HLD, and tremor; chronic constipation; GERD with Barrett's esophagus; chronic anticoagulation due to Factor V Leiden Deficiency  Care Plan : General Pharmacy (Adult)  Updates made by Cherre Robins, PHARMD since 04/07/2021 12:00 AM     Problem: Hypertension, Hyperlipidemia, Factor 5 Leiden, Hx of Barrett's Esophagus/Hx of C.diff/GERD,   Priority: High  Onset Date: 10/02/2020     Goal: Patient-Specific Goal   Start Date: 10/02/2020  Expected End Date: 04/01/2021  Recent Progress: On track  Priority: High  Note:   Current Barriers:  Unable to achieve control of lipids  Unable to control constipation; Sometimes feels that Linzess works too well (leading to loose stools)  Pharmacist Clinical Goal(s):  Over the next 180 days, patient will achieve adherence to monitoring guidelines and medication adherence to achieve therapeutic efficacy achieve control of lipids  as evidenced by updated lipid panel through collaboration with PharmD and provider.  Coordinate with gastroenterology office to lower dose of Linzess or find alternative.   Interventions: 1:1 collaboration with Copland, Gay Filler, MD regarding development and update of comprehensive plan of care as evidenced by provider attestation and  co-signature Inter-disciplinary care team collaboration (see longitudinal plan of care) Comprehensive medication review performed; medication list updated in electronic medical record  Hypertension (BP goal <140/90) Controlled Current treatment: Losartan/hctz 100/12.49m daily AM Propranolol LA 835mdaily (for tremors) Medications previously tried: amlodipine-benazepril (swelling in feet)  Current home readings: 120 - 130 / 75-80 Current exercise habits: walking daily for about 20 minutes Denies hypotensive/hypertensive symptoms. He did have a fall 01/18/2021 but reports it was due to tripping on door frame and not due to dizziness. Interventions:  Reviewed BP goals and benefits  of medications for prevention of heart attack, stroke and kidney damage; Continue to monitor BP at home weekly, document, and provide log at future appointments Recommended to continue current medication Continue to exercise with goal of 150 minutes per week as able.  Hyperlipidemia: (LDL goal < 100) Controlled; Last LDL was 96 (07/26/2020) Tg were slightly elevated Current treatment: Simvastatin 1m at bedtime Fish Oil 12045m- 2 capsules = 240068maily Medications previously tried: atorvastatin (swelling)  Discontinued alcohol use 2 years ago Current exercise habits: see above Interventions:  Reviewed cholesterol goals;  Benefits of statin for ASCVD risk reduction; Recommended to continue current medication  Factor V Leiden Mutation (Goal: maintain INR 2.0 to 3.0) Managed by anticoag clinic on [place] was therapeutic.  Current treatment  Warfarin 5mg39mily as directed (5mg 47mTuesdays and  2.5mg a44mother days) Medications previously tried:none noted Interventions: (addresed at previous visits)  No changes to warfarin regimen  Recommended to continue current medication  Chronic Constipation: Not controlled Current therapy:  Linzess 145mcg 81my OTC stool softener as needed Managed  by Dr KennedyBerniece Papient report he has not taken Linzess in 4 days due to "working too well" but if he does not take it can cause him to feel "too full / constipated" In last phone message, the pharmacy assistant / CMA has mentioned that patient had concerns about cost of Linzess. Per patient cost is $47/month which is tier 3. He states he can afford this amount.  Interventions:  There is a lower dose of Linzess 72mcg p43mnt could try. Messaged GI office to see if they could approve Linzess lower dose 72mcg an76m they might have 1 or 2 weeks of samples patient could try. (Recommendation accepted by GI - samples provided by GI office to patient)   Patient Goals/Self-Care Activities Over the next 180 days, patient will:  - take medications as prescribed check blood pressure weekly, document, and provide at future appointments target a minimum of 150 minutes of moderate intensity exercise weekly  Follow Up Plan: The care management team will reach out to the patient again over the next 7 to 14 days; Clinical pharmacist will follow up in 6 months, unless needed sooner.        Medication Assistance:  No needed  Patient's preferred pharmacy is:  [id number] [id number] [phone]:  Patient agrees to Care Plan and Follow-up.  Plan: Telephone follow up appointment with care management team member scheduled for:  30 days by pharmacy team CMA and 6 months with pharmacist unless needed sooner.   Shantoria Ellwood EckaCherre Robinslinical Pharmacist [place] [facility]

## 2021-04-04 NOTE — Telephone Encounter (Signed)
Tammy from Dr. Janett Billow Copland office (PCP)requesting if patient can try samples of lower dose '75mg'$  of Linzess. Pt informed PCP provider the linzess 175 is too strong.. Plz advise  thanks

## 2021-04-05 NOTE — Telephone Encounter (Signed)
Patrick Hatfield please advise if ok to lower dose.

## 2021-04-07 NOTE — Patient Instructions (Signed)
Visit Information  PATIENT GOALS:  Goals Addressed             This Visit's Progress    Chronic Care Management Pharmacy Care Plan   On track    CARE PLAN ENTRY (see longitudinal plan of care for additional care plan information)  Current Barriers:  Chronic Disease Management support, education, and care coordination needs related to Hypertension, Hyperlipidemia, Factor 5 Leiden, Hx of Barrett's Esophagus/Hx of C.diff/GERD, BPH   Hypertension BP Readings from Last 3 Encounters:  01/31/21 122/80  01/18/21 136/67  11/19/20 124/68  Pharmacist Clinical Goal(s): Over the next 90 days, patient will work with PharmD and providers to maintain BP goal <140/90 Current regimen:  Losartan/hctz 100/12.'5mg'$  daily AM Propranolol SR '80mg'$  daily (for tremors) Patient self care activities - Over the next 90 days, patient will: Check BP daily, document, and provide at future appointments Ensure daily salt intake < 2300 mg/day  Hyperlipidemia Lab Results  Component Value Date/Time   LDLCALC 164 (H) 03/02/2019 10:49 AM   LDLDIRECT 96.0 07/26/2020 11:29 AM  Pharmacist Clinical Goal(s): Over the next 90 days, patient will work with PharmD and providers to maintain LDL goal < 100, achieve TRIG goal <150 Current regimen:  Simvastatin '10mg'$  daily at bedtime Omega 3 Fatty Acid '1200mg'$  #2 daily AM Interventions: Discussed diet and exercise Patient self care activities - Over the next 90 days, patient will: Maintain cholesterol medication regimen.   Factor V Leiden Mutation (Goal: maintain INR 2.0 to 3.0) Pharmacist Clinical Goal(s) Over the next 180 days, patient will work with PharmD and providers to maintain therapeutic anticoagulation Current regimen:  Warfarin '5mg'$  daily as directed ('5mg'$  on Tuesdays and  '25mg'$  all other days) Patient self care activities - Over the next 180 days, patient will: Continue current medication Continue to follow up with coumadin clinic for regular INR  checks  Chronic Constipation: Pharmacist Clinical Goal(s) Over the next 30 days, patient will work with PharmD and providers to improve constipation while preventing loose bowel movements. Current regimen:  Linzess 17mg daily OTC stool softener as needed Interventions: There is a lower dose of Linzess 741m patient could try. Messaged GI office to see if they could approve Linzess lower dose 726mand if they might have 1 or 2 weeks of samples patient could try.  (Recommendation accepted by GI - samples provided by GI office to patient) Patient self care activities - Over the next 30 days, patient will: Start lower dose of Linzess 44m33maily   Medication management Pharmacist Clinical Goal(s): Over the next 90 days, patient will work with PharmD and providers to maintain optimal medication adherence [facility] Interventions Comprehensive medication review performed. Continue current medication management strategy Patient self care activities - Over the next 90 days, patient will: Focus on medication adherence by filling and taking medications appropriately  Take medications as prescribed Report any questions or concerns to PharmD and/or provider(s)  Please see past updates related to this goal by clicking on the "Past Updates" button in the selected goal          Patient verbalizes understanding of instructions provided today and agrees to view in MyChSouthportTelephone follow up appointment with care management team member scheduled for: 6 months  TammCherre RobinsarmD Clinical Pharmacist [facility]

## 2021-04-09 ENCOUNTER — Ambulatory Visit (INDEPENDENT_AMBULATORY_CARE_PROVIDER_SITE_OTHER): Payer: Medicare Other

## 2021-04-09 ENCOUNTER — Other Ambulatory Visit: Payer: Self-pay

## 2021-04-09 DIAGNOSIS — Z7901 Long term (current) use of anticoagulants: Secondary | ICD-10-CM | POA: Diagnosis not present

## 2021-04-09 LAB — POCT INR: INR: 2.5 (ref 2.0–3.0)

## 2021-04-09 NOTE — Patient Instructions (Addendum)
Pre visit review using our clinic review tool, if applicable. No additional management support is needed unless otherwise documented below in the visit note.  Continue to take 1/2 tablet all days except 1 tablet on Tuesday and Saturdays.  Re-check in 6 weeks.

## 2021-04-09 NOTE — Progress Notes (Signed)
Medical screening examination/treatment/procedure(s) were performed by non-physician practitioner and as supervising physician I was immediately available for consultation/collaboration. I agree with above. Tarique Essie Gehret, MD   

## 2021-04-19 ENCOUNTER — Ambulatory Visit (INDEPENDENT_AMBULATORY_CARE_PROVIDER_SITE_OTHER): Payer: Medicare Other | Admitting: Pharmacist

## 2021-04-19 ENCOUNTER — Telehealth: Payer: Self-pay | Admitting: Nurse Practitioner

## 2021-04-19 DIAGNOSIS — I10 Essential (primary) hypertension: Secondary | ICD-10-CM

## 2021-04-19 DIAGNOSIS — K5909 Other constipation: Secondary | ICD-10-CM

## 2021-04-19 MED ORDER — LINACLOTIDE 72 MCG PO CAPS: 72.0000 ug | ORAL_CAPSULE | Freq: Every day | ORAL | 1 refills | Status: DC

## 2021-04-19 NOTE — Telephone Encounter (Signed)
RX sent

## 2021-04-19 NOTE — Telephone Encounter (Signed)
04/19/2021 12:46 PM EDT by Irwin Brakeman I 04/19/2021 12:46 PM EDT by Irwin Brakeman I IncomingTurnage, Patrick Easterly "[name]" [phone] (Mobile) - Communicated - Needs Linzess '72mg'$  refill sent to [place] please

## 2021-04-19 NOTE — Chronic Care Management (AMB) (Signed)
Chronic Care Management Pharmacy Note  04/19/2021 Name:  Patrick Hatfield MRN:  [id number] DOB:  10/31/1934  Summary:  Patient reports Linzess 58mg daily working better with less loose BMs than 1434m strength. Coordinated new Rx from GI office. Mailed patient assistance application in case reaches coverage gap.  Subjective: Patrick Hatfield an [age].o. year old male who is a primary patient of Copland, Patrick Hatfield.  The CCM team was consulted for assistance with disease management and care coordination needs.    Engaged with patient by telephone for follow up visit in response to provider referral for pharmacy case management and/or care coordination services.   Consent to Services:  The patient was given information about Chronic Care Management services, agreed to services, and gave verbal consent prior to initiation of services.  Please see initial visit note for detailed documentation.   Patient Care Team: Copland, Patrick Hatfield as PCP - General (Family Medicine) Patrick Hatfield as Consulting Physician (Orthopedic Surgery) Patrick Hatfield as Consulting Physician (Gastroenterology) Patrick Hatfield as Consulting Physician (Pulmonary Disease) Patrick Hatfield (Pharmacist)  Recent office visits: 01/31/21- Patrick Hatfield- seen for 6 month follow up of hypertension, labs ordered, no medication changes, follow up 6 months  11/19/20- Patrick Hatfield- seen for follow up abdominal pain, no medication changes, no follow up documented   Recent consult visit: 04/09/2021 - Anticoagulation clinic. ([facility]) INR was 2.5. No med changes. Continued warfarin 96m30mn Tuesdays; 2.96mg21ml other days. F/U 6 weeks. 04/04/2021 - GI Phone call  - lowered Linzess dose to 72mc37mily - give 2 weeks of samples.  02/26/2021 - Anticoagulation clinic ([facility] diagnosis: Factor V deficiency.  INR was 2.0. Continued warfarin 96mg o33muesdays; 2.96mg al42mther days. F/U 6  weeks.  11/09/20- Orders Only (Gastro)- Linzess 145 mcg daily before breakfast  Hospital visits: [facility]/18/2021 - [facility] Lidocaine 5%  Cyclobenzaprine 10 mg twice daily as needed Oxycodone 5 mg every 4 hours prn  All other medications remain the same after Hospital Discharge  Objective:  Lab Results  Component Value Date   CREATININE 0.88 01/31/2021   CREATININE 0.86 07/26/2020   CREATININE 0.73 12/05/2019    Lab Results  Component Value Date   HGBA1C 5.2 03/02/2019   Last diabetic Eye exam: No results found for: HMDIABEYEEXA  Last diabetic Foot exam: No results found for: HMDIABFOOTEX      Component Value Date/Time   CHOL 176 07/26/2020 1129   TRIG 267.0 (H) 07/26/2020 1129   HDL 36.10 (L) 07/26/2020 1129   CHOLHDL 5 07/26/2020 1129   VLDL 53.4 (H) 07/26/2020 1129   LDLCALC 164 (H) 03/02/2019 1049   LDLDIRECT 96.0 07/26/2020 1129    Hepatic Function Latest Ref Rng & Units 07/26/2020 12/05/2019 11/22/2019  Total Protein 6.0 - 8.3 g/dL 7.6 7.3 8.0  Albumin 3.5 - 5.2 g/dL 4.3 3.7 3.7  AST 0 - 37 U/L _0 ALT 0 - 53 U/L _1 Alk Phosphatase 39 - 117 U/L 49 64 53  Total Bilirubin 0.2 - 1.2 mg/dL 2.3(H) 1.5(H) 2.7(H)  Bilirubin, Direct 0.0 - 0.3 mg/dL - - -    Lab Results  Component Value Date/Time   TSH 1.54 03/02/2019 10:49 AM   TSH 1.63 02/08/2014 10:12 AM    CBC Latest Ref Rng & Units 01/31/2021 07/26/2020 12/05/2019  WBC 4.0 - 10.5 K/uL 7.3 6.4 10.9(H)  Hemoglobin 13.0 - 17.0 g/dL 15.7 15.1 15.0  Hematocrit 39.0 - 52.0 % 46.8 44.7 44.9  Platelets 150.0 - 400.0 K/uL 135.0(L) 124.0(L) 197.0    No results found for: VD25OH  Clinical ASCVD: No  The ASCVD Risk score Mikey Bussing DC Jr., et al., 2013) failed to calculate for the following reasons:   The 2013 ASCVD risk score is only valid for ages 43 to 38     Social History   Tobacco Use  Smoking Status Former   Packs/day: 2.00    Years: 40.00   Pack years: 80.00   Types: Cigarettes   Quit date: 08/18/1994   Years since quitting: 26.6  Smokeless Tobacco Never  Tobacco Comments   smoked 1956-1996, up to 2 ppd   BP Readings from Last 3 Encounters:  01/31/21 122/80  01/18/21 136/67  11/19/20 124/68   Pulse Readings from Last 3 Encounters:  01/31/21 77  01/18/21 (!) 58  11/19/20 70   Wt Readings from Last 3 Encounters:  01/31/21 204 lb (92.5 kg)  01/18/21 208 lb 7 oz (94.5 kg)  11/19/20 200 lb (90.7 kg)    Assessment: Review of patient past medical history, allergies, medications, health status, including review of consultants reports, laboratory and other test data, was performed as part of comprehensive evaluation and provision of chronic care management services.   SDOH:  (Social Determinants of Health) assessments and interventions performed:     CCM Care Plan  Allergies  Allergen Reactions   Amlodipine Besy-Benazepril Hcl     REACTION: swelling of feet.  Probably  from Amlodipine component)   Lipitor [Atorvastatin Calcium]     ? Reaction, ? Swelling=- update 7/20.  Pt recalls his legs hurt and perhaps swelled slightly - JC    Medications Reviewed Today     Reviewed by Cherre Robins, PharmD (Pharmacist) on 04/19/21 at 1252  Med List Status: <None>   Medication Order Taking? Sig Documenting Provider Last Dose Status Informant  acetaminophen (TYLENOL) 325 MG tablet [id number] Yes Per bottle as needed [provider] Taking Active            Med Note [name]   Fri Mar 30, 2020 11:11 AM) As needed  Al Hyd-Mg Tr-Alg Ac-Sod Bicarb (GAVISCON-2 PO) [id number] Yes Take 0.5-1 tablets by mouth as needed. [provider] Taking Active            Med Note [name]   Fri Mar 30, 2020 11:11 AM) As needed  cholecalciferol (VITAMIN D) 1000 UNITS tablet [id number] Yes Take 1,000 Units by mouth daily. [provider] Taking Active   diazepam (VALIUM) 5 MG tablet [id number]  Yes TAKE 1/2 TO 1 TABLET BY MOUTH AT BEDTIME AS NEEDED FOR SLEEP Copland, Gay Filler, MD Taking Active   famotidine (PEPCID) 10 MG tablet [id number] Yes Take 10 mg by mouth 2 (two) times daily as needed for heartburn or indigestion. [provider] Taking Active   Fluticasone Propionate (FLONASE NA) [id number] Yes Place into the nose as needed. [provider] Taking Active            Med Note [name]   Fri Mar 30, 2020 11:13 AM) As needed  folic acid (FOLVITE) 102 MCG tablet [id number] Yes Take 400 mcg by mouth daily. [provider] Taking Active   gabapentin (NEURONTIN) 300 MG capsule [id number]  TAKE 1 OR 2 CAPSULES BY MOUTH AT BEDTIME AS NEEDED  Patient not  taking: Reported on 04/04/2021   Copland, Gay Filler, MD  Active   linaclotide Rolan Lipa) 72 MCG capsule [id number] Yes Take 72 mcg by mouth daily before breakfast. [provider] Taking Active   losartan-hydrochlorothiazide (HYZAAR) 100-12.5 MG tablet [id number] Yes TAKE 1 TABLET BY MOUTH EVERY DAY Copland, Gay Filler, MD Taking Active   Omega-3 Fatty Acids (FISH OIL) 1200 MG CAPS [id number] Yes Take 2,400 mg by mouth daily. [provider] Taking Active   propranolol ER (INDERAL LA) 80 MG 24 hr capsule [id number] Yes Take 1 capsule (80 mg total) by mouth daily. Copland, Gay Filler, MD Taking Active   simvastatin (ZOCOR) 10 MG tablet [id number] Yes Take 1 tablet (10 mg total) by mouth at bedtime. Copland, Gay Filler, MD Taking Active   warfarin (COUMADIN) 5 MG tablet [id number] Yes TAKE AS DIRECTED BY ANTICOAGULATION CLINIC Copland, Gay Filler, MD Taking Active             Patient Active Problem List   Diagnosis Date Noted   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   Thrombocytopenia, unspecified ([place]) 01/05/2013   Rosanna Randy syndrome 01/05/2013   Unspecified adverse effect of unspecified drug, medicinal and biological substance 01/09/2012   Barrett's esophagus 03/24/2011   Factor 5 Leiden mutation, heterozygous ([place]) 03/21/2011   Essential tremor 04/24/2009   HYPERLIPIDEMIA 10/03/2008   Essential hypertension 10/03/2008   GERD 10/03/2008   Fasting hyperglycemia 10/03/2008   DVT, HX OF 10/03/2008    Immunization History  Administered Date(s) Administered   DT (Pediatric) 02/16/2015   Fluad Quad(high Dose 65+) 05/10/2019, 05/17/2020   Influenza Split 05/03/2012   Influenza Whole 07/24/2010   Influenza, High Dose Seasonal PF 05/20/2013, 05/09/2016, 05/15/2017, 04/20/2018   Influenza,inj,Quad PF,6+ Mos 05/03/2014, 05/16/2015   PFIZER(Purple Top)SARS-COV-2 Vaccination 08/27/2019, 09/17/2019, 07/31/2020   Pneumococcal Conjugate-13 09/05/2015   Pneumococcal Polysaccharide-23 09/26/2016   Td 03/03/2015   Tdap 03/08/2015   Zoster Recombinat (Shingrix) 02/22/2018, 10/05/2018    Conditions to be addressed/monitored: HTN, HLD, and tremor; chronic constipation; GERD with Barrett's esophagus; chronic anticoagulation due to Factor V Leiden Deficiency  Care Plan : General Pharmacy (Adult)  Updates made by Cherre Robins, PHARMD since 04/19/2021 12:00 AM     Problem: Hypertension, Hyperlipidemia, Factor 5 Leiden, Hx of Barrett's Esophagus/Hx of C.diff/GERD,   Priority: High  Onset Date: 10/02/2020     Goal: Patient-Specific Goal   Start Date: 10/02/2020  Expected End Date: 04/01/2021  Recent Progress: On track  Priority: High  Note:   Current Barriers:  Unable to achieve control of lipids  Unable to control constipation; Sometimes feels that Linzess works too well (leading to loose stools) - improved with lower dose of Linzess  Pharmacist Clinical Goal(s):  Over the next 180 days,  patient will achieve adherence to monitoring guidelines and medication adherence to achieve therapeutic efficacy achieve control of lipids  as evidenced by updated lipid panel through collaboration with PharmD and provider.  Coordinate with gastroenterology office to lower dose of Linzess or find alternative.   Interventions: 1:1 collaboration with Copland, Gay Filler, MD regarding development and update of comprehensive plan of care as evidenced by provider attestation and co-signature Inter-disciplinary care team collaboration (see longitudinal plan of care)  Comprehensive medication review performed; medication list updated in electronic medical record  Hypertension (BP goal <140/90) Controlled Current treatment: Losartan/hctz 100/12.11m daily AM Propranolol LA 883mdaily (for tremors) Medications previously tried: amlodipine-benazepril (swelling in feet)  Current home readings: 120 - 130 / 75-80 Current exercise habits: walking daily for about 20 minutes Denies hypotensive/hypertensive symptoms. He did have a fall 01/18/2021 but reports it was due to tripping on door frame and not due to dizziness. Interventions: (addressed at previous visit)  Reviewed BP goals and benefits of medications for prevention of heart attack, stroke and kidney damage; Continue to monitor BP at home weekly, document, and provide log at future appointments Recommended to continue current medication Continue to exercise with goal of 150 minutes per week as able.  Hyperlipidemia: (LDL goal < 100) Controlled; Last LDL was 96 (07/26/2020) Tg were slightly elevated Current treatment: Simvastatin 1087mt bedtime Fish Oil 1200m58m2 capsules = 2400mg40mly Medications previously tried: atorvastatin (swelling)  Discontinued alcohol use 2 years ago Current exercise habits: see above Interventions: (addressed at previous visit)  Reviewed cholesterol goals;  Benefits of statin for ASCVD risk reduction; Recommended  to continue current medication  Factor V Leiden Mutation (Goal: maintain INR 2.0 to 3.0) Managed by anticoag clinic on [facility] INR was therapeutic.  Current treatment  Warfarin 5mg d33my as directed (5mg on71mesdays and  2.5mg all32mher days) Medications previously tried:none noted Interventions: (addresed at previous visits)  No changes to warfarin regimen  Recommended to continue current medication  Chronic Constipation: Improved with lower dose of Linzess Reports having more consistent, firm but not loose stools Current therapy:  Linzess 72 mcg daily (dose lowered 03/2021) OTC stool softener as needed Managed by Dr Kennedy-Berniece Papr medications tried: Linzess 145mcg - 7mking too well" / had loose stools In last phone message, the pharmacy assistant / CMA has mentioned that patient had concerns about cost of Linzess. Per patient cost is $47/month which is tier 3. He states he can afford this amount.  Interventions:  Coordinated with GI office to get presription for Linzess 72mcg dai50mailed patient assistance application for Linzess   Patient Goals/Self-Care Activities Over the next 180 days, patient will:  - take medications as prescribed check blood pressure weekly, document, and provide at future appointments target a minimum of 150 minutes of moderate intensity exercise weekly  Follow Up Plan: Clinical pharmacist will follow up in 6 months, unless needed sooner.        Medication Assistance: Mailed patient application for Linzess PAP  Patient's preferred pharmacy is:  [id number] [phone]:  Patient agrees to Care Plan and Follow-up.  Plan: Telephone follow up appointment with care management team  member scheduled for:  30 days by pharmacy team CMA and 6 months with pharmacist unless needed sooner.   Ketrick Matney EckarCherre Robinsinical Pharmacist [facility]

## 2021-04-19 NOTE — Patient Instructions (Signed)
Visit Information  PATIENT GOALS:  Goals Addressed             This Visit's Progress    Chronic Care Management Pharmacy Care Plan   On track    CARE PLAN ENTRY (see longitudinal plan of care for additional care plan information)  Current Barriers:  Chronic Disease Management support, education, and care coordination needs related to Hypertension, Hyperlipidemia, Factor 5 Leiden, Hx of Barrett's Esophagus/Hx of C.diff/GERD, BPH   Hypertension BP Readings from Last 3 Encounters:  01/31/21 122/80  01/18/21 136/67  11/19/20 124/68  Pharmacist Clinical Goal(s): Over the next 90 days, patient will work with PharmD and providers to maintain BP goal <140/90 Current regimen:  Losartan/hctz 100/12.'5mg'$  daily AM Propranolol SR '80mg'$  daily (for tremors) Patient self care activities - Over the next 90 days, patient will: Check BP daily, document, and provide at future appointments Ensure daily salt intake < 2300 mg/day  Hyperlipidemia Lab Results  Component Value Date/Time   LDLCALC 164 (H) 03/02/2019 10:49 AM   LDLDIRECT 96.0 07/26/2020 11:29 AM  Pharmacist Clinical Goal(s): Over the next 90 days, patient will work with PharmD and providers to maintain LDL goal < 100, achieve TRIG goal <150 Current regimen:  Simvastatin '10mg'$  daily at bedtime Omega 3 Fatty Acid '1200mg'$  #2 daily AM Interventions: Discussed diet and exercise Patient self care activities - Over the next 90 days, patient will: Maintain cholesterol medication regimen.   Factor V Leiden Mutation (Goal: maintain INR 2.0 to 3.0) Pharmacist Clinical Goal(s) Over the next 180 days, patient will work with PharmD and providers to maintain therapeutic anticoagulation Current regimen:  Warfarin '5mg'$  daily as directed ('5mg'$  on Tuesdays and  '25mg'$  all other days) Patient self care activities - Over the next 180 days, patient will: Continue current medication Continue to follow up with coumadin clinic for regular INR  checks  Chronic Constipation: Pharmacist Clinical Goal(s) Over the next 30 days, patient will work with PharmD and providers to improve constipation while preventing loose bowel movements. Current regimen:  Linzess 72 mcg daily OTC stool softener as needed Interventions: Coordinated with GI to get update prescription for lower dose of Linzess [id number] Mailed application for patient assistance Patient self care activities - Over the next 30 days, patient will: Continue lower dose of Linzess 28mg daily   Medication management Pharmacist Clinical Goal(s): Over the next 90 days, patient will work with PharmD and providers to maintain optimal medication adherence [facility] Interventions Comprehensive medication review performed. Continue current medication management strategy Patient self care activities - Over the next 90 days, patient will: Focus on medication adherence by filling and taking medications appropriately  Take medications as prescribed Report any questions or concerns to PharmD and/or provider(s)  Please see past updates related to this goal by clicking on the "Past Updates" button in the selected goal          The patient verbalized understanding of instructions, educational materials, and care plan provided today and declined offer to receive copy of patient instructions, educational materials, and care plan.   Telephone follow up appointment with care management team member scheduled for: 5 months  TCherre Robins PharmD Clinical Pharmacist [facility]

## 2021-04-28 ENCOUNTER — Ambulatory Visit: Payer: Medicare Other

## 2021-05-03 ENCOUNTER — Other Ambulatory Visit: Payer: Self-pay | Admitting: Family Medicine

## 2021-05-03 DIAGNOSIS — G250 Essential tremor: Secondary | ICD-10-CM

## 2021-05-15 ENCOUNTER — Ambulatory Visit (INDEPENDENT_AMBULATORY_CARE_PROVIDER_SITE_OTHER): Payer: Medicare Other

## 2021-05-15 VITALS — Ht 70.0 in | Wt 210.0 lb

## 2021-05-15 DIAGNOSIS — Z0000 Encounter for general adult medical examination without abnormal findings: Secondary | ICD-10-CM | POA: Diagnosis not present

## 2021-05-15 NOTE — Patient Instructions (Signed)
Patrick Hatfield , Thank you for taking time to complete your Medicare Wellness Visit. I appreciate your ongoing commitment to your health goals. Please review the following plan we discussed and let me know if I can assist you in the future.   Screening recommendations/referrals: Colonoscopy: No longer required Recommended yearly ophthalmology/optometry visit for glaucoma screening and checkup Recommended yearly dental visit for hygiene and checkup  Vaccinations: Influenza vaccine: Due-May obtain vaccine at our office or your local pharmacy. Pneumococcal vaccine: Up to date Tdap vaccine: Up to date-Due-03/07/2025 Shingles vaccine: Completed vaccines   Covid-19: Booster available at the pharmacy.  Advanced directives: Please bring a copy for your chart  Conditions/risks identified: See problem list  Next appointment: Follow up in one year for your annual wellness visit. 05/19/2022 @ 2:20  Preventive Care 84 Years and Older, Male Preventive care refers to lifestyle choices and visits with your health care provider that can promote health and wellness. What does preventive care include? A yearly physical exam. This is also called an annual well check. Dental exams once or twice a year. Routine eye exams. Ask your health care provider how often you should have your eyes checked. Personal lifestyle choices, including: Daily care of your teeth and gums. Regular physical activity. Eating a healthy diet. Avoiding tobacco and drug use. Limiting alcohol use. Practicing safe sex. Taking low doses of aspirin every day. Taking vitamin and mineral supplements as recommended by your health care provider. What happens during an annual well check? The services and screenings done by your health care provider during your annual well check will depend on your age, overall health, lifestyle risk factors, and family history of disease. Counseling  Your health care provider may ask you questions about  your: Alcohol use. Tobacco use. Drug use. Emotional well-being. Home and relationship well-being. Sexual activity. Eating habits. History of falls. Memory and ability to understand (cognition). Work and work Statistician. Screening  You may have the following tests or measurements: Height, weight, and BMI. Blood pressure. Lipid and cholesterol levels. These may be checked every 5 years, or more frequently if you are over 84 years old. Skin check. Lung cancer screening. You may have this screening every year starting at age 84 if you have a 30-pack-year history of smoking and currently smoke or have quit within the past 15 years. Fecal occult blood test (FOBT) of the stool. You may have this test every year starting at age 84. Flexible sigmoidoscopy or colonoscopy. You may have a sigmoidoscopy every 5 years or a colonoscopy every 10 years starting at age 84. Prostate cancer screening. Recommendations will vary depending on your family history and other risks. Hepatitis C blood test. Hepatitis B blood test. Sexually transmitted disease (STD) testing. Diabetes screening. This is done by checking your blood sugar (glucose) after you have not eaten for a while (fasting). You may have this done every 1-3 years. Abdominal aortic aneurysm (AAA) screening. You may need this if you are a current or former smoker. Osteoporosis. You may be screened starting at age 84 if you are at high risk. Talk with your health care provider about your test results, treatment options, and if necessary, the need for more tests. Vaccines  Your health care provider may recommend certain vaccines, such as: Influenza vaccine. This is recommended every year. Tetanus, diphtheria, and acellular pertussis (Tdap, Td) vaccine. You may need a Td booster every 10 years. Zoster vaccine. You may need this after age 84. Pneumococcal 13-valent conjugate (PCV13) vaccine. One  dose is recommended after age 84. Pneumococcal  polysaccharide (PPSV23) vaccine. One dose is recommended after age 84. Talk to your health care provider about which screenings and vaccines you need and how often you need them. This information is not intended to replace advice given to you by your health care provider. Make sure you discuss any questions you have with your health care provider. Document Released: 08/31/2015 Document Revised: 04/23/2016 Document Reviewed: 06/05/2015 Elsevier Interactive Patient Education  2017 [employer] Prevention in the Home Falls can cause injuries. They can happen to people of all ages. There are many things you can do to make your home safe and to help prevent falls. What can I do on the outside of my home? Regularly fix the edges of walkways and driveways and fix any cracks. Remove anything that might make you trip as you walk through a door, such as a raised step or threshold. Trim any bushes or trees on the path to your home. Use bright outdoor lighting. Clear any walking paths of anything that might make someone trip, such as rocks or tools. Regularly check to see if handrails are loose or broken. Make sure that both sides of any steps have handrails. Any raised decks and porches should have guardrails on the edges. Have any leaves, snow, or ice cleared regularly. Use sand or salt on walking paths during winter. Clean up any spills in your garage right away. This includes oil or grease spills. What can I do in the bathroom? Use night lights. Install grab bars by the toilet and in the tub and shower. Do not use towel bars as grab bars. Use non-skid mats or decals in the tub or shower. If you need to sit down in the shower, use a plastic, non-slip stool. Keep the floor dry. Clean up any water that spills on the floor as soon as it happens. Remove soap buildup in the tub or shower regularly. Attach bath mats securely with double-sided non-slip rug tape. Do not have throw rugs and other  things on the floor that can make you trip. What can I do in the bedroom? Use night lights. Make sure that you have a light by your bed that is easy to reach. Do not use any sheets or blankets that are too big for your bed. They should not hang down onto the floor. Have a firm chair that has side arms. You can use this for support while you get dressed. Do not have throw rugs and other things on the floor that can make you trip. What can I do in the kitchen? Clean up any spills right away. Avoid walking on wet floors. Keep items that you use a lot in easy-to-reach places. If you need to reach something above you, use a strong step stool that has a grab bar. Keep electrical cords out of the way. Do not use floor polish or wax that makes floors slippery. If you must use wax, use non-skid floor wax. Do not have throw rugs and other things on the floor that can make you trip. What can I do with my stairs? Do not leave any items on the stairs. Make sure that there are handrails on both sides of the stairs and use them. Fix handrails that are broken or loose. Make sure that handrails are as long as the stairways. Check any carpeting to make sure that it is firmly attached to the stairs. Fix any carpet that is loose or worn.  Avoid having throw rugs at the top or bottom of the stairs. If you do have throw rugs, attach them to the floor with carpet tape. Make sure that you have a light switch at the top of the stairs and the bottom of the stairs. If you do not have them, ask someone to add them for you. What else can I do to help prevent falls? Wear shoes that: Do not have high heels. Have rubber bottoms. Are comfortable and fit you well. Are closed at the toe. Do not wear sandals. If you use a stepladder: Make sure that it is fully opened. Do not climb a closed stepladder. Make sure that both sides of the stepladder are locked into place. Ask someone to hold it for you, if possible. Clearly  mark and make sure that you can see: Any grab bars or handrails. First and last steps. Where the edge of each step is. Use tools that help you move around (mobility aids) if they are needed. These include: Canes. Walkers. Scooters. Crutches. Turn on the lights when you go into a dark area. Replace any light bulbs as soon as they burn out. Set up your furniture so you have a clear path. Avoid moving your furniture around. If any of your floors are uneven, fix them. If there are any pets around you, be aware of where they are. Review your medicines with your doctor. Some medicines can make you feel dizzy. This can increase your chance of falling. Ask your doctor what other things that you can do to help prevent falls. This information is not intended to replace advice given to you by your health care provider. Make sure you discuss any questions you have with your health care provider. Document Released: 05/31/2009 Document Revised: 01/10/2016 Document Reviewed: 09/08/2014 Elsevier Interactive Patient Education  2017 [employer].

## 2021-05-15 NOTE — Progress Notes (Signed)
Subjective:   Patrick Hatfield is a 86 y.o. male who presents for Medicare Annual/Subsequent preventive examination.  I connected with Patrick Hatfield today by telephone and verified that I am speaking with the correct person using two identifiers. Location patient: home Location provider: work Persons participating in the virtual visit: patient, Marine scientist.    I discussed the limitations, risks, security and privacy concerns of performing an evaluation and management service by telephone and the availability of in person appointments. I also discussed with the patient that there may be a patient responsible charge related to this service. The patient expressed understanding and verbally consented to this telephonic visit.    Interactive audio and video telecommunications were attempted between this provider and patient, however failed, due to patient having technical difficulties OR patient did not have access to video capability.  We continued and completed visit with audio only.  Some vital signs may be absent or patient reported.   Time Spent with patient on telephone encounter: 40 minutes   Review of Systems     Cardiac Risk Factors include: advanced age (>12men, >57 women);male gender;dyslipidemia;hypertension;obesity (BMI >30kg/m2)     Objective:    Today's Vitals   05/15/21 1422  Weight: 210 lb (95.3 kg)  Height: 5\' 10"  (1.778 m)   Body mass index is 30.13 kg/m.  Advanced Directives 05/15/2021 01/18/2021 01/18/2021 04/27/2020 04/27/2020 11/22/2019 12/14/2018  Does Patient Have a Medical Advance Directive? Yes Yes No Yes No Yes Yes  Type of Paramedic of [place];Living will Living will - Patrick Hatfield;Living will - - [place];Living will  Does patient want to make changes to medical advance directive? - No - Patient declined - - - No - Patient declined No - Patient declined  Copy of Patrick Hatfield in Chart? No - copy  requested - - - - - No - copy requested  Would patient like information on creating a medical advance directive? - No - Patient declined - - - - -    Current Medications (verified) Outpatient Encounter Medications as of 05/15/2021  Medication Sig   acetaminophen (TYLENOL) 325 MG tablet Per bottle as needed   Al Hyd-Mg Tr-Alg Ac-Sod Bicarb (GAVISCON-2 PO) Take 0.5-1 tablets by mouth as needed.   cholecalciferol (VITAMIN D) 1000 UNITS tablet Take 1,000 Units by mouth daily.   diazepam (VALIUM) 5 MG tablet TAKE 1/2 TO 1 TABLET BY MOUTH AT BEDTIME AS NEEDED FOR SLEEP   famotidine (PEPCID) 10 MG tablet Take 10 mg by mouth 2 (two) times daily as needed for heartburn or indigestion.   Fluticasone Propionate (FLONASE NA) Place into the nose as needed.   folic acid (FOLVITE) 683 MCG tablet Take 400 mcg by mouth daily.   linaclotide (LINZESS) 72 MCG capsule Take 1 capsule (72 mcg total) by mouth daily before breakfast. Due for office visit.   losartan-hydrochlorothiazide (HYZAAR) 100-12.5 MG tablet TAKE 1 TABLET BY MOUTH EVERY DAY   Omega-3 Fatty Acids (FISH OIL) 1200 MG CAPS Take 2,400 mg by mouth daily.   propranolol ER (INDERAL LA) 80 MG 24 hr capsule TAKE 1 CAPSULE BY MOUTH EVERY DAY   simvastatin (ZOCOR) 10 MG tablet Take 1 tablet (10 mg total) by mouth at bedtime.   warfarin (COUMADIN) 5 MG tablet TAKE AS DIRECTED BY ANTICOAGULATION CLINIC   [DISCONTINUED] gabapentin (NEURONTIN) 300 MG capsule TAKE 1 OR 2 CAPSULES BY MOUTH AT BEDTIME AS NEEDED (Patient not taking: Reported on 04/04/2021)  No facility-administered encounter medications on file as of 05/15/2021.    Allergies (verified) Amlodipine besy-benazepril hcl and Lipitor [atorvastatin calcium]   History: Past Medical History:  Diagnosis Date   Anxiety    Arthritis    KNEES,ANKLES,HANDS   Asthma    AS A TEENAGER   Barrett's esophagus    Benign essential tremor    Clotting disorder (HCC)    FACTOR 5   Colitis in 20's   Colon  polyps    Congenital deficiency of other clotting factors    factor 5   Depression 2006   drug overdose   Diverticulosis    DVT (deep venous thrombosis) ([place] [facility]) 1991 & 2010   X 2 in context Factor 5 def   Esophageal reflux    Factor 5 Leiden mutation, heterozygous ([place])    Gilbert's syndrome    H/O ischemic bowel disease 2010   while off warfarin    Herpes zoster 2010   facial   Hiatal hernia    Hx of adenomatous colonic polyps 12/14/2014   Hyperlipidemia    Hypertension    Insomnia    Osteoporosis    Personal history of colonic polyps 05/28/2007   adenomatous   Proteinuria age 57   Thrombosis of mesenteric vein ([place]) 52/77/8242   10/5359   Umbilical hernia    Past Surgical History:  Procedure Laterality Date   CHOLECYSTECTOMY  05/16/2011    Dr Marlou Starks   COLONOSCOPY     Dr Sharlett Iles   CYSTOSCOPY     Neg   EYE SURGERY Bilateral 10/16/2017   secondary catarct surgery.   KNEE ARTHROSCOPY Bilateral 2008, 2012   Dr.Aplington bilateral   TONSILLECTOMY     TRANSURETHRAL RESECTION OF PROSTATE      BPH;Dr Dahlstedt   Family History  Problem Relation Age of Onset   Pancreatic cancer Father 64   Ulcers Father    Stroke Father 72   Diabetes Father    Coronary artery disease Mother    Osteoporosis Mother    HIV Brother    Multiple sclerosis Sister    Heart attack Paternal Grandfather        early 27s   Heart attack Paternal Uncle        2 uncles in early 84s   Aortic aneurysm Brother    Colon cancer Neg Hx    Esophageal cancer Neg Hx    Stomach cancer Neg Hx    Social History   Socioeconomic History   Marital status: Married    Spouse name: Not on file   Number of children: 4   Years of education: Not on file   Highest education level: Not on file  Occupational History   Occupation: Retired    Fish farm manager: RETIRED  Tobacco Use   Smoking status: Former    Packs/day: 2.00    Years: 40.00    Pack years: 80.00    Types: Cigarettes    Quit date: 08/18/1994    Years  since quitting: 26.7   Smokeless tobacco: Never   Tobacco comments:    smoked 1956-1996, up to 2 ppd  Vaping Use   Vaping Use: Never used  Substance and Sexual Activity   Alcohol use: Yes    Alcohol/week: 3.0 standard drinks    Types: 3 Cans of beer per week   Drug use: No   Sexual activity: Not Currently    Birth control/protection: None    Comment: Married  Other Topics Concern   Not on file  Social History Narrative   Married, lives with spouse   Married x56 years   4 daughters   4 grandchildren   Retired - Armed forces operational officer, sales/technology   Social Determinants of Radio broadcast assistant Strain: Low Risk    Difficulty of Paying Living Expenses: Not hard at all  Food Insecurity: Not on file  Transportation Needs: Not on file  Physical Activity: Not on file  Stress: Not on file  Social Connections: Not on file    Tobacco Counseling Counseling given: Not Answered Tobacco comments: smoked 1956-1996, up to 2 ppd   Clinical Intake:  Pre-visit preparation completed: Yes  Pain : No/denies pain     BMI - recorded: 30.13 Nutritional Status: BMI > 30  Obese Nutritional Risks: None Diabetes: No  How often do you need to have someone help you when you read instructions, pamphlets, or other written materials from your doctor or pharmacy?: 1 - Never  Diabetic?No  Interpreter Needed?: No  Information entered by :: Caroleen Hamman LPN   Activities of Daily Living In your present state of health, do you have any difficulty performing the following activities: 05/15/2021  Hearing? Y  Comment mild  Vision? N  Difficulty concentrating or making decisions? N  Walking or climbing stairs? N  Dressing or bathing? N  Doing errands, shopping? N  Preparing Food and eating ? N  Using the Toilet? N  In the past six months, have you accidently leaked urine? N  Do you have problems with loss of bowel control? N  Managing your Medications? N  Managing your Finances? N   Housekeeping or managing your Housekeeping? N  Some recent data might be hidden    Patient Care Team: Copland, Gay Filler, MD as PCP - General (Family Medicine) Latanya Maudlin, MD as Consulting Physician (Orthopedic Surgery) Gatha Mayer, MD as Consulting Physician (Gastroenterology) Marshell Garfinkel, MD as Consulting Physician (Pulmonary Disease) Cherre Robins, PharmD (Pharmacist)  Indicate any recent Medical Services you may have received from other than [facility] providers in the past year (date may be approximate).     Assessment:   This is a routine wellness examination for Ayaan.  Hearing/Vision screen Hearing Screening - Comments:: C/O some hearing loss Vision Screening - Comments:: Last eye exam-04/2020-Dr. Daisy Floro wears glasses  Dietary issues and exercise activities discussed: Current Exercise Habits: Home exercise routine, Type of exercise: walking, Time (Minutes): 15, Frequency (Times/Week): 7, Weekly Exercise (Minutes/Week): 105, Intensity: Mild, Exercise limited by: None identified   Goals Addressed             This Visit's Progress    Maintain healthy active lifestyle.    On track      Depression Screen PHQ 2/9 Scores 05/15/2021 01/31/2021 07/26/2020 12/14/2018 12/03/2017 09/26/2016 01/17/2016  PHQ - 2 Score 0 0 0 0 0 0 0    Fall Risk Fall Risk  05/15/2021 04/04/2021 01/31/2021 07/26/2020 12/14/2018  Falls in the past year? 1 1 1  0 0  Number falls in past yr: 0 0 0 0 -  Injury with Fall? 1 1 1  0 -  Risk for fall due to : History of fall(s) History of fall(s) - - -  Follow up Falls prevention discussed Falls evaluation completed;Falls prevention discussed - - -    FALL RISK PREVENTION PERTAINING TO THE HOME:  Any stairs in or around the home? No  Home free of loose throw rugs in walkways, pet beds, electrical cords, etc? Yes  Adequate lighting in  your home to reduce risk of falls? Yes   ASSISTIVE DEVICES UTILIZED TO PREVENT FALLS:  Life alert? No  Use of a  cane, walker or w/c? No  Grab bars in the bathroom? No  Shower chair or bench in shower? No  Elevated toilet seat or a handicapped toilet? No   TIMED UP AND GO:  Was the test performed? No . Phone visit   Cognitive Function:Normal cognitive status assessed by this Nurse Health Advisor. No abnormalities found.   MMSE - Mini Mental State Exam 09/26/2016  Orientation to time 4  Orientation to Place 5  Registration 3  Attention/ Calculation 5  Recall 3  Language- name 2 objects 2  Language- repeat 1  Language- follow 3 step command 3  Language- read & follow direction 1  Write a sentence 1  Copy design 1  Total score 29        Immunizations Immunization History  Administered Date(s) Administered   DT (Pediatric) 02/16/2015   Fluad Quad(high Dose 65+) 05/10/2019, 05/17/2020   Influenza Split 05/03/2012   Influenza Whole 07/24/2010   Influenza, High Dose Seasonal PF 05/20/2013, 05/09/2016, 05/15/2017, 04/20/2018   Influenza,inj,Quad PF,6+ Mos 05/03/2014, 05/16/2015   PFIZER(Purple Top)SARS-COV-2 Vaccination 08/27/2019, 09/17/2019, 07/31/2020   Pneumococcal Conjugate-13 09/05/2015   Pneumococcal Polysaccharide-23 09/26/2016   Td 03/03/2015   Tdap 03/08/2015   Zoster Recombinat (Shingrix) 02/22/2018, 10/05/2018    TDAP status: Up to date  Flu Vaccine status: Due, Education has been provided regarding the importance of this vaccine. Advised may receive this vaccine at local pharmacy or Health Dept. Aware to provide a copy of the vaccination record if obtained from local pharmacy or Health Dept. Verbalized acceptance and understanding.  Pneumococcal vaccine status: Up to date  Covid-19 vaccine status: Information provided on how to obtain vaccines. Booster due  Qualifies for Shingles Vaccine? No   Zostavax completed No   Shingrix Completed?: Yes  Screening Tests Health Maintenance  Topic Date Due   COVID-19 Vaccine (4 - Booster for Pfizer series) 11/29/2020    INFLUENZA VACCINE  03/18/2021   TETANUS/TDAP  03/07/2025   Zoster Vaccines- Shingrix  Completed   HPV VACCINES  Aged Out    Health Maintenance  Health Maintenance Due  Topic Date Due   COVID-19 Vaccine (4 - Booster for Pfizer series) 11/29/2020   INFLUENZA VACCINE  03/18/2021    Colorectal cancer screening: No longer required.   Lung Cancer Screening: (Low Dose CT Chest recommended if Age 31-80 years, 30 pack-year currently smoking OR have quit w/in 15years.) does not qualify.    Additional Screening:  Hepatitis C Screening: does not qualify  Vision Screening: Recommended annual ophthalmology exams for early detection of glaucoma and other disorders of the eye. Is the patient up to date with their annual eye exam?  Yes  Who is the provider or what is the name of the office in which the patient attends annual eye exams? Dr. Ellie Lunch   Dental Screening: Recommended annual dental exams for proper oral hygiene  Community Resource Referral / Chronic Care Management: CRR required this visit?  No   CCM required this visit?  No      Plan:     I have personally reviewed and noted the following in the patient's chart:   Medical and social history Use of alcohol, tobacco or illicit drugs  Current medications and supplements including opioid prescriptions. Patient is not currently taking opioid prescriptions. Functional ability and status Nutritional status Physical activity Advanced directives  List of other physicians Hospitalizations, surgeries, and ER visits in previous 12 months Vitals Screenings to include cognitive, depression, and falls Referrals and appointments  In addition, I have reviewed and discussed with patient certain preventive protocols, quality metrics, and best practice recommendations. A written personalized care plan for preventive services as well as general preventive health recommendations were provided to patient.   Due to this being a telephonic  visit, the after visit summary with patients personalized plan was offered to patient via mail or my-chart. Patient would like to access on my-chart.   Marta Antu, LPN   8/37/2902  Nurse Health Advisor  Nurse Notes: None

## 2021-05-21 ENCOUNTER — Ambulatory Visit (INDEPENDENT_AMBULATORY_CARE_PROVIDER_SITE_OTHER): Payer: Medicare Other

## 2021-05-21 ENCOUNTER — Other Ambulatory Visit: Payer: Self-pay

## 2021-05-21 DIAGNOSIS — Z7901 Long term (current) use of anticoagulants: Secondary | ICD-10-CM | POA: Diagnosis not present

## 2021-05-21 LAB — POCT INR: INR: 2.3 (ref 2.0–3.0)

## 2021-05-21 NOTE — Progress Notes (Signed)
Medical screening examination/treatment/procedure(s) were performed by non-physician practitioner and as supervising physician I was immediately available for consultation/collaboration. I agree with above. Pharrell Milt Coye, MD   

## 2021-05-21 NOTE — Patient Instructions (Addendum)
Pre visit review using our clinic review tool, if applicable. No additional management support is needed unless otherwise documented below in the visit note.  Continue to take 1/2 tablet all days except 1 tablet on Tuesday and Saturdays.  Re-check in 6 weeks.

## 2021-05-23 ENCOUNTER — Other Ambulatory Visit (HOSPITAL_BASED_OUTPATIENT_CLINIC_OR_DEPARTMENT_OTHER): Payer: Self-pay

## 2021-05-23 MED ORDER — INFLUENZA VAC A&B SA ADJ QUAD 0.5 ML IM PRSY
PREFILLED_SYRINGE | INTRAMUSCULAR | 0 refills | Status: DC
Start: 2021-05-23 — End: 2021-10-07
  Filled 2021-05-23: qty 0.5, 1d supply, fill #0

## 2021-06-03 ENCOUNTER — Telehealth: Payer: Self-pay | Admitting: Pharmacist

## 2021-06-03 NOTE — Chronic Care Management (AMB) (Unsigned)
    Chronic Care Management Pharmacy Assistant   Name: Patrick Hatfield  MRN: [id number] DOB: [birth year]  Reason for Encounter: Disease State General  Recent office visits:  05/21/21 [facility] An RN- pt was seen for INR. Follow up 11/15.  Recent consult visits:  None Noted  Hospital visits:  {Hospital DC Yes/No:[id number]}  Medications: Outpatient Encounter Medications as of 06/03/2021  Medication Sig Note   acetaminophen (TYLENOL) 325 MG tablet Per bottle as needed 03/30/2020: As needed   Al Hyd-Mg Tr-Alg Ac-Sod Bicarb (GAVISCON-2 PO) Take 0.5-1 tablets by mouth as needed. 03/30/2020: As needed   cholecalciferol (VITAMIN D) 1000 UNITS tablet Take 1,000 Units by mouth daily.    diazepam (VALIUM) 5 MG tablet TAKE 1/2 TO 1 TABLET BY MOUTH AT BEDTIME AS NEEDED FOR SLEEP    famotidine (PEPCID) 10 MG tablet Take 10 mg by mouth 2 (two) times daily as needed for heartburn or indigestion.    Fluticasone Propionate (FLONASE NA) Place into the nose as needed. 3/88/8280: As needed   folic acid (FOLVITE) 034 MCG tablet Take 400 mcg by mouth daily.    influenza vaccine adjuvanted (FLUAD) 0.5 ML injection Inject into the muscle.    linaclotide (LINZESS) 72 MCG capsule Take 1 capsule (72 mcg total) by mouth daily before breakfast. Due for office visit.    losartan-hydrochlorothiazide (HYZAAR) 100-12.5 MG tablet TAKE 1 TABLET BY MOUTH EVERY DAY    Omega-3 Fatty Acids (FISH OIL) 1200 MG CAPS Take 2,400 mg by mouth daily.    propranolol ER (INDERAL LA) 80 MG 24 hr capsule TAKE 1 CAPSULE BY MOUTH EVERY DAY    simvastatin (ZOCOR) 10 MG tablet Take 1 tablet (10 mg total) by mouth at bedtime.    warfarin (COUMADIN) 5 MG tablet TAKE AS DIRECTED BY ANTICOAGULATION CLINIC    No facility-administered encounter medications on file as of 06/03/2021.    Have you had any problems recently with your health?   Have you had any problems with your pharmacy?   What issues or side effects are you  having with your medications?   What would you like me to pass along to Edison Nasuti Potts,CPP for them to help you with?    What can we do to take care of you better?   Star Rating Drugs: losartan-hydrochlorothiazide (HYZAAR) 100-12.5 MG tablet last fill  simvastatin (ZOCOR) 10 MG tablet last fill  linaclotide (LINZESS) 72 MCG capsule last fill 04/19/21 30 DS  [place] Clinical Pharmacist Assistant [phone]

## 2021-06-10 ENCOUNTER — Telehealth: Payer: Self-pay | Admitting: Pharmacist

## 2021-06-10 NOTE — Progress Notes (Unsigned)
    Chronic Care Management Pharmacy Assistant   Name: Patrick Hatfield  MRN: [id number] DOB: 1935-06-24   Reason for Encounter: Disease State   Conditions to be addressed/monitored: General   Recent office visits:  None ID  Recent consult visits:  None ID  Hospital visits:  None since last coordination call  Medications: Outpatient Encounter Medications as of 06/03/2021  Medication Sig Note   acetaminophen (TYLENOL) 325 MG tablet Per bottle as needed 03/30/2020: As needed   Al Hyd-Mg Tr-Alg Ac-Sod Bicarb (GAVISCON-2 PO) Take 0.5-1 tablets by mouth as needed. 03/30/2020: As needed   cholecalciferol (VITAMIN D) 1000 UNITS tablet Take 1,000 Units by mouth daily.    diazepam (VALIUM) 5 MG tablet TAKE 1/2 TO 1 TABLET BY MOUTH AT BEDTIME AS NEEDED FOR SLEEP    famotidine (PEPCID) 10 MG tablet Take 10 mg by mouth 2 (two) times daily as needed for heartburn or indigestion.    Fluticasone Propionate (FLONASE NA) Place into the nose as needed. 11/23/6806: As needed   folic acid (FOLVITE) 811 MCG tablet Take 400 mcg by mouth daily.    influenza vaccine adjuvanted (FLUAD) 0.5 ML injection Inject into the muscle.    linaclotide (LINZESS) 72 MCG capsule Take 1 capsule (72 mcg total) by mouth daily before breakfast. Due for office visit.    losartan-hydrochlorothiazide (HYZAAR) 100-12.5 MG tablet TAKE 1 TABLET BY MOUTH EVERY DAY    Omega-3 Fatty Acids (FISH OIL) 1200 MG CAPS Take 2,400 mg by mouth daily.    propranolol ER (INDERAL LA) 80 MG 24 hr capsule TAKE 1 CAPSULE BY MOUTH EVERY DAY    simvastatin (ZOCOR) 10 MG tablet Take 1 tablet (10 mg total) by mouth at bedtime.    warfarin (COUMADIN) 5 MG tablet TAKE AS DIRECTED BY ANTICOAGULATION CLINIC    No facility-administered encounter medications on file as of 06/03/2021.   Have you had any problems recently with your health? Patient states that he does not have any new health issues.  Have you had any problems with your pharmacy?Patient  states that he is not having any problems with getting his medications or the cost of  medications from the pharmacy  What issues or side effects are you having with your medications? Patient states that he does not have any side effects from medications  What would you like me to pass along to Tammy Eckard,CPP for them to help you with? Patient states that he is only taking Linzess once a week or sometimes one every week and a half  What can we do to take care of you better? Patient states, no not at this time but appreciate the call  Care Gaps: Colonoscopy-NA Diabetic Foot Exam-NA Ophthalmology-NA Dexa Scan -NA  Annual Well Visit - NA Micro albumin-NA Hemoglobin A1c- NA  Star Rating Drugs: Losartan-hctz last fill 03/25/21 90 ds Simvastatin 10 mg last fill 04/29/21 ds  Ethelene Hal Clinical Pharmacist Assistant [phone]

## 2021-06-10 NOTE — Chronic Care Management (AMB) (Signed)
    Chronic Care Management Pharmacy Assistant   Name: Patrick Hatfield  MRN: [id number] DOB: 16-Aug-1935   Reason for Encounter: Disease State   Conditions to be addressed/monitored: General   Recent office visits:  None ID  Recent consult visits:  None ID  Hospital visits:  None in previous 6 months  Medications: Outpatient Encounter Medications as of 06/10/2021  Medication Sig Note   acetaminophen (TYLENOL) 325 MG tablet Per bottle as needed 03/30/2020: As needed   Al Hyd-Mg Tr-Alg Ac-Sod Bicarb (GAVISCON-2 PO) Take 0.5-1 tablets by mouth as needed. 03/30/2020: As needed   cholecalciferol (VITAMIN D) 1000 UNITS tablet Take 1,000 Units by mouth daily.    diazepam (VALIUM) 5 MG tablet TAKE 1/2 TO 1 TABLET BY MOUTH AT BEDTIME AS NEEDED FOR SLEEP    famotidine (PEPCID) 10 MG tablet Take 10 mg by mouth 2 (two) times daily as needed for heartburn or indigestion.    Fluticasone Propionate (FLONASE NA) Place into the nose as needed. 02/14/4764: As needed   folic acid (FOLVITE) 465 MCG tablet Take 400 mcg by mouth daily.    influenza vaccine adjuvanted (FLUAD) 0.5 ML injection Inject into the muscle.    linaclotide (LINZESS) 72 MCG capsule Take 1 capsule (72 mcg total) by mouth daily before breakfast. Due for office visit.    losartan-hydrochlorothiazide (HYZAAR) 100-12.5 MG tablet TAKE 1 TABLET BY MOUTH EVERY DAY    Omega-3 Fatty Acids (FISH OIL) 1200 MG CAPS Take 2,400 mg by mouth daily.    propranolol ER (INDERAL LA) 80 MG 24 hr capsule TAKE 1 CAPSULE BY MOUTH EVERY DAY    simvastatin (ZOCOR) 10 MG tablet Take 1 tablet (10 mg total) by mouth at bedtime.    warfarin (COUMADIN) 5 MG tablet TAKE AS DIRECTED BY ANTICOAGULATION CLINIC    No facility-administered encounter medications on file as of 06/10/2021.   Have you had any problems recently with your health? Patient states that he does not have any new health issues.   Have you had any problems with your pharmacy?Patient states  that he is not having any problems with getting his medications or the cost of  medications from the pharmacy   What issues or side effects are you having with your medications? Patient states that he does not have any side effects from medications   What would you like me to pass along to Tammy Eckard,CPP for them to help you with? Patient states that he is only taking Linzess once a week or sometimes one every week and a half   What can we do to take care of you better? Patient states, no not at this time but appreciate the call   Care Gaps: Colonoscopy-NA Diabetic Foot Exam-NA Ophthalmology-NA Dexa Scan -NA  Annual Well Visit - NA Micro albumin-NA Hemoglobin A1c- NA  Star Rating Drugs: Losartan-hctz last fill 03/25/21 90 ds Simvastatin 10 mg last fill 04/29/21 ds  Ethelene Hal Clinical Pharmacist Assistant [phone]

## 2021-06-22 ENCOUNTER — Other Ambulatory Visit: Payer: Self-pay | Admitting: Family Medicine

## 2021-06-22 DIAGNOSIS — I10 Essential (primary) hypertension: Secondary | ICD-10-CM

## 2021-06-24 ENCOUNTER — Encounter: Payer: Self-pay | Admitting: Family Medicine

## 2021-06-24 ENCOUNTER — Other Ambulatory Visit: Payer: Self-pay | Admitting: Family Medicine

## 2021-06-24 DIAGNOSIS — M62838 Other muscle spasm: Secondary | ICD-10-CM

## 2021-06-24 NOTE — Telephone Encounter (Signed)
Requesting: Valium  Contract:  UDS: Last OV: 01/21/2021 Next OV: 07/24/2021 Last Refill: 12/25/2020, #30--3 RF Database:   Please advise

## 2021-07-02 ENCOUNTER — Ambulatory Visit (INDEPENDENT_AMBULATORY_CARE_PROVIDER_SITE_OTHER): Payer: Medicare Other

## 2021-07-02 ENCOUNTER — Other Ambulatory Visit: Payer: Self-pay

## 2021-07-02 DIAGNOSIS — Z7901 Long term (current) use of anticoagulants: Secondary | ICD-10-CM

## 2021-07-02 LAB — POCT INR: INR: 2.3 (ref 2.0–3.0)

## 2021-07-02 NOTE — Patient Instructions (Addendum)
Pre visit review using our clinic review tool, if applicable. No additional management support is needed unless otherwise documented below in the visit note.  Continue to take 1/2 tablet all days except 1 tablet on Tuesday and Saturdays.  Recheck in 8 weeks 

## 2021-07-02 NOTE — Progress Notes (Signed)
Continue to take 1/2 tablet all days except 1 tablet on Tuesday and Saturdays.  Recheck in 8 weeks per pt request. 

## 2021-07-02 NOTE — Progress Notes (Signed)
Patient ID: Patrick Hatfield, male   DOB: [birth year], 86 y.o.   MRN: [id number]  Medical screening examination/treatment/procedure(s) were performed by non-physician practitioner and as supervising physician I was immediately available for consultation/collaboration.  I agree with above. Cathlean Cower, MD

## 2021-07-20 NOTE — Progress Notes (Addendum)
[facility] at [facility] [place], [place], [place] [address] [phone]  Date:  07/24/2021   Name:  Patrick Hatfield   DOB:  Jan 13, 1935   MRN:  [id number]  PCP:  Darreld Mclean, MD    Chief Complaint: 6 month follow up (Concerns/ questions: stye on L eye- seems to be resolving/Flu shot: received 2-3 weeks ago/)   History of Present Illness:  Patrick Hatfield is a 86 y.o. very pleasant male patient who presents with the following:  Pt seen today for a periodic follow-up visit Last seen by myself in June- history of factor V Leiden, DVT, mesenteric vein thrombosis currently on Coumadin, essential tremor, hypertension, hyperlipidemia Coumadin continues to be very stable, he is only seen every 8 weeks now for INR check  He has noted a stye on his left eye- he tried some hot compresses with tea bags which have helped, stye seems to be resolving He first noted the stye 3 weeks ago Vision is normal  Covid booster Flu vaccine- done   Lab Results  Component Value Date   PSA 2.41 01/31/2021   PSA 1.83 01/23/2020   PSA 2.02 03/02/2019   Need to recheck PSA today to follow-up on increased as above He is working on weight loss- he is pleased with his progress so far He also notes a long term firm cyst on the back of his neck- never painful or otherwise bothersome  His wife is also noticed skin lesions on his trunk, would like me to check these for him today  Patient Active Problem List   Diagnosis Date Noted   [name] diarrhea 12/28/2019   Hearing loss 01/26/2018   Long term (current) use of anticoagulants 05/15/2017   Encounter for therapeutic drug monitoring 12/05/2016   Insomnia 09/05/2015   Diarrhea 09/05/2015   Hx of adenomatous colonic polyps 12/14/2014   Hematuria, gross 02/03/2014   Thrombosis of mesenteric vein ([place]) 07/12/2013   Thrombocytopenia, unspecified ([place]) 01/05/2013   Rosanna Randy syndrome 01/05/2013    Unspecified adverse effect of unspecified drug, medicinal and biological substance 01/09/2012   Barrett's esophagus 03/24/2011   Factor 5 Leiden mutation, heterozygous ([place]) 03/21/2011   Essential tremor 04/24/2009   HYPERLIPIDEMIA 10/03/2008   Essential hypertension 10/03/2008   GERD 10/03/2008   Fasting hyperglycemia 10/03/2008   DVT, HX OF 10/03/2008    Past Medical History:  Diagnosis Date   Anxiety    Arthritis    KNEES,ANKLES,HANDS   Asthma    AS A TEENAGER   Barrett's esophagus    Benign essential tremor    Clotting disorder ([place])    FACTOR 5   Colitis in 20's   Colon polyps    Congenital deficiency of other clotting factors    factor 5   Depression 2006   drug overdose   Diverticulosis    DVT (deep venous thrombosis) ([place]) 1991 & 2010   X 2 in context Factor 5 def   Esophageal reflux    Factor 5 Leiden mutation, heterozygous ([place])    Gilbert's syndrome    H/O ischemic bowel disease 2010   while off warfarin    Herpes zoster 2010   facial   Hiatal hernia    Hx of adenomatous colonic polyps 12/14/2014   Hyperlipidemia    Hypertension    Insomnia    Osteoporosis    Personal history of colonic polyps 05/28/2007   adenomatous   Proteinuria age  16   Thrombosis of mesenteric vein ([place]) 82/70/7867   12/4490   Umbilical hernia     Past Surgical History:  Procedure Laterality Date   CHOLECYSTECTOMY  05/16/2011    Dr Marlou Starks   COLONOSCOPY     Dr Sharlett Iles   CYSTOSCOPY     Neg   EYE SURGERY Bilateral 10/16/2017   secondary catarct surgery.   KNEE ARTHROSCOPY Bilateral 2008, 2012   Dr.Aplington bilateral   TONSILLECTOMY     TRANSURETHRAL RESECTION OF PROSTATE      B[facility];Dr Dahlstedt    Social History   Tobacco Use   Smoking status: Former    Packs/day: 2.00    Years: 40.00    Pack years: 80.00    Types: Cigarettes    Quit date: 08/18/1994    Years since quitting: 26.9   Smokeless tobacco: Never   Tobacco comments:    smoked 1956-1996, up to 2 ppd   Vaping Use   Vaping Use: Never used  Substance Use Topics   Alcohol use: Yes    Alcohol/week: 3.0 standard drinks    Types: 3 Cans of beer per week   Drug use: No    Family History  Problem Relation Age of Onset   Pancreatic cancer Father 22   Ulcers Father    Stroke Father 71   Diabetes Father    Coronary artery disease Mother    Osteoporosis Mother    HIV Brother    Multiple sclerosis Sister    Heart attack Paternal Grandfather        early 46s   Heart attack Paternal Uncle        2 uncles in early 25s   Aortic aneurysm Brother    Colon cancer Neg Hx    Esophageal cancer Neg Hx    Stomach cancer Neg Hx     Allergies  Allergen Reactions   Amlodipine Besy-Benazepril Hcl     REACTION: swelling of feet.  Probably  from Amlodipine component)   Lipitor [Atorvastatin Calcium]     ? Reaction, ? Swelling=- update 7/20.  Pt recalls his legs hurt and perhaps swelled slightly - JC    Medication list has been reviewed and updated.  Current Outpatient Medications on File Prior to Visit  Medication Sig Dispense Refill   acetaminophen (TYLENOL) 325 MG tablet Per bottle as needed     Al Hyd-Mg Tr-Alg Ac-Sod Bicarb (GAVISCON-2 PO) Take 0.5-1 tablets by mouth as needed.     cholecalciferol (VITAMIN D) 1000 UNITS tablet Take 1,000 Units by mouth daily.     diazepam (VALIUM) 5 MG tablet TAKE 1/2 TO 1 TABLET BY MOUTH AT BEDTIME AS NEEDED FOR SLEEP 30 tablet 0   famotidine (PEPCID) 10 MG tablet Take 10 mg by mouth 2 (two) times daily as needed for heartburn or indigestion.     Fluticasone Propionate (FLONASE NA) Place into the nose as needed.     folic acid (FOLVITE) 010 MCG tablet Take 400 mcg by mouth daily.     influenza vaccine adjuvanted (FLUAD) 0.5 ML injection Inject into the muscle. 0.5 mL 0   linaclotide (LINZESS) 72 MCG capsule Take 1 capsule (72 mcg total) by mouth daily before breakfast. Due for office visit. 30 capsule 1   losartan-hydrochlorothiazide (HYZAAR) 100-12.5 MG  tablet TAKE 1 TABLET BY MOUTH EVERY DAY 90 tablet 2   Omega-3 Fatty Acids (FISH OIL) 1200 MG CAPS Take 2,400 mg by mouth daily.     propranolol ER (INDERAL LA)  80 MG 24 hr capsule TAKE 1 CAPSULE BY MOUTH EVERY DAY 90 capsule 3   simvastatin (ZOCOR) 10 MG tablet Take 1 tablet (10 mg total) by mouth at bedtime. 90 tablet 3   warfarin (COUMADIN) 5 MG tablet TAKE AS DIRECTED BY ANTICOAGULATION CLINIC 120 tablet 3   No current facility-administered medications on file prior to visit.    Review of Systems:  As per HPI- otherwise negative.    Physical Examination: Vitals:   07/24/21 1042  BP: 124/80  Pulse: 72  Resp: 18  Temp: 97.8 F (36.6 C)  SpO2: 96%   Vitals:   07/24/21 1042  Weight: 202 lb 3.2 oz (91.7 kg)  Height: 5\' 10"  (1.778 m)   Body mass index is 29.01 kg/m. Ideal Body Weight: Weight in (lb) to have BMI = 25: 173.9  GEN: no acute distress.  Obese, looks well Baseline tremor present HEENT: Atraumatic, Normocephalic.  Ears and Nose: No external deformity. CV: RRR, No M/G/R. No JVD. No thrill. No extra heart sounds. PULM: CTA B, no wheezes, crackles, rhonchi. No retractions. No resp. distress. No accessory muscle use. EXTR: No c/c/e PSYCH: Normally interactive. Conversant.  Resolving stye on left lower lid. Back of his neck displays a noninfected, nontender sebaceous cyst He has multiple seborrheic keratoses on his trunk and back Wt Readings from Last 3 Encounters:  07/24/21 202 lb 3.2 oz (91.7 kg)  05/15/21 210 lb (95.3 kg)  01/31/21 204 lb (92.5 kg)    Assessment and Plan: Essential hypertension - Plan: Comprehensive metabolic panel, CBC  Increased prostate specific antigen (PSA) velocity - Plan: PSA  Hordeolum externum of left lower eyelid  Sebaceous cyst  Seborrheic keratosis  Seen today for follow-up.  Blood pressure under good control, continue losartan/HCTZ and propranolol Stye is resolving.  I asked patient to let me know if not completely  healed in the next couple of weeks Counseled the sebaceous cyst on his neck can be left alone unless it causes pain or seems to become infected Seborrheic keratoses also a benign skin lesion, reassured Follow-up on PSA today  Signed Lamar Blinks, MD  12/8 Received labs as below, message to patient  Results for orders placed or performed in visit on 07/24/21  PSA  Result Value Ref Range   PSA 2.56 0.10 - 4.00 ng/mL  Comprehensive metabolic panel  Result Value Ref Range   Sodium 136 135 - 145 mEq/L   Potassium 4.2 3.5 - 5.1 mEq/L   Chloride 98 96 - 112 mEq/L   CO2 32 19 - 32 mEq/L   Glucose, Bld 152 (H) 70 - 99 mg/dL   BUN 17 6 - 23 mg/dL   Creatinine, Ser 0.87 0.40 - 1.50 mg/dL   Total Bilirubin 2.8 (H) 0.2 - 1.2 mg/dL   Alkaline Phosphatase 64 39 - 117 U/L   AST 29 0 - 37 U/L   ALT 36 0 - 53 U/L   Total Protein 7.7 6.0 - 8.3 g/dL   Albumin 4.4 3.5 - 5.2 g/dL   GFR 79.08 >60.00 mL/min   Calcium 9.7 8.4 - 10.5 mg/dL  CBC  Result Value Ref Range   WBC 6.3 4.0 - 10.5 K/uL   RBC 4.90 4.22 - 5.81 Mil/uL   Platelets 134.0 (L) 150.0 - 400.0 K/uL   Hemoglobin 15.7 13.0 - 17.0 g/dL   HCT 46.6 39.0 - 52.0 %   MCV 95.2 78.0 - 100.0 fl   MCHC 33.8 30.0 - 36.0 g/dL   RDW  14.0 11.5 - 15.5 %     

## 2021-07-20 NOTE — Patient Instructions (Addendum)
Good to see you again today!  Your BP looks fine- continue current medication I will be in touch with your labs asap Assuming all is well please see me in 6 months You have a few benign skin lesions- nothing that I am concerned about however  Please let me know if the stye does not completely resolve soon  I would also recommend getting the latest covid booster at your pharmacy asap

## 2021-07-24 ENCOUNTER — Ambulatory Visit (INDEPENDENT_AMBULATORY_CARE_PROVIDER_SITE_OTHER): Payer: Medicare Other | Admitting: Family Medicine

## 2021-07-24 VITALS — BP 124/80 | HR 72 | Temp 97.8°F | Resp 18 | Ht 70.0 in | Wt 202.2 lb

## 2021-07-24 DIAGNOSIS — I10 Essential (primary) hypertension: Secondary | ICD-10-CM

## 2021-07-24 DIAGNOSIS — R972 Elevated prostate specific antigen [PSA]: Secondary | ICD-10-CM | POA: Diagnosis not present

## 2021-07-24 DIAGNOSIS — H00015 Hordeolum externum left lower eyelid: Secondary | ICD-10-CM

## 2021-07-24 DIAGNOSIS — L723 Sebaceous cyst: Secondary | ICD-10-CM | POA: Diagnosis not present

## 2021-07-24 DIAGNOSIS — L821 Other seborrheic keratosis: Secondary | ICD-10-CM | POA: Diagnosis not present

## 2021-07-24 LAB — COMPREHENSIVE METABOLIC PANEL
ALT: 36 U/L (ref 0–53)
AST: 29 U/L (ref 0–37)
Albumin: 4.4 g/dL (ref 3.5–5.2)
Alkaline Phosphatase: 64 U/L (ref 39–117)
BUN: 17 mg/dL (ref 6–23)
CO2: 32 mEq/L (ref 19–32)
Calcium: 9.7 mg/dL (ref 8.4–10.5)
Chloride: 98 mEq/L (ref 96–112)
Creatinine, Ser: 0.87 mg/dL (ref 0.40–1.50)
GFR: 79.08 mL/min (ref >60.00)
Glucose, Bld: 152 mg/dL — ABNORMAL HIGH (ref 70–99)
Potassium: 4.2 mEq/L (ref 3.5–5.1)
Sodium: 136 mEq/L (ref 135–145)
Total Bilirubin: 2.8 mg/dL — ABNORMAL HIGH (ref 0.2–1.2)
Total Protein: 7.7 g/dL (ref 6.0–8.3)

## 2021-07-24 LAB — CBC
HCT: 46.6 % (ref 39.0–52.0)
Hemoglobin: 15.7 g/dL (ref 13.0–17.0)
MCHC: 33.8 g/dL (ref 30.0–36.0)
MCV: 95.2 fl (ref 78.0–100.0)
Platelets: 134 10*3/uL — ABNORMAL LOW (ref 150.0–400.0)
RBC: 4.9 Mil/uL (ref 4.22–5.81)
RDW: 14 % (ref 11.5–15.5)
WBC: 6.3 10*3/uL (ref 4.0–10.5)

## 2021-07-24 LAB — PSA: PSA: 2.56 ng/mL (ref 0.10–4.00)

## 2021-07-25 ENCOUNTER — Encounter: Payer: Self-pay | Admitting: Family Medicine

## 2021-07-25 DIAGNOSIS — R972 Elevated prostate specific antigen [PSA]: Secondary | ICD-10-CM

## 2021-07-27 ENCOUNTER — Other Ambulatory Visit: Payer: Self-pay | Admitting: Family Medicine

## 2021-07-27 DIAGNOSIS — E785 Hyperlipidemia, unspecified: Secondary | ICD-10-CM

## 2021-08-01 ENCOUNTER — Other Ambulatory Visit: Payer: Self-pay | Admitting: Family Medicine

## 2021-08-13 ENCOUNTER — Other Ambulatory Visit: Payer: Self-pay | Admitting: Family

## 2021-08-13 ENCOUNTER — Encounter: Payer: Self-pay | Admitting: Family Medicine

## 2021-08-13 MED ORDER — IPRATROPIUM BROMIDE 0.03 % NA SOLN: 2.0000 | Freq: Two times a day (BID) | NASAL | 0 refills | Status: DC

## 2021-08-15 ENCOUNTER — Telehealth (INDEPENDENT_AMBULATORY_CARE_PROVIDER_SITE_OTHER): Payer: Medicare Other | Admitting: Family Medicine

## 2021-08-15 VITALS — BP 130/77 | HR 71 | Ht 70.0 in

## 2021-08-15 DIAGNOSIS — I10 Essential (primary) hypertension: Secondary | ICD-10-CM | POA: Diagnosis not present

## 2021-08-15 DIAGNOSIS — E785 Hyperlipidemia, unspecified: Secondary | ICD-10-CM

## 2021-08-15 DIAGNOSIS — U071 COVID-19: Secondary | ICD-10-CM

## 2021-08-15 MED ORDER — MOLNUPIRAVIR 200 MG PO CAPS: 4.0000 | ORAL_CAPSULE | Freq: Two times a day (BID) | ORAL | 0 refills | Status: AC

## 2021-08-15 NOTE — Progress Notes (Signed)
° °  Patrick Hatfield is a 86 y.o. male who presents today for a virtual office visit.  Assessment/Plan:  New/Acute Problems: COVID No red flag signs or symptoms.  Is at increased risk for complication due to age and other comorbidities.  We discussed treatment options.  We will start [facility].  Encouraged good hydration.  He can continue over-the-counter meds.  Discussed reasons to return to care and seek emergent care.  Chronic Problems Addressed Today: Essential Hypertension /dyslipidemia Well-controlled on current regimen of simvastatin 40 mg daily and propranolol 80 mg daily, losartan-HCTZ 100-12.5mg  once daily though increases risk for complication from MIWOE-32.  We will avoid paxlovid due to medication interatctions.     Subjective:  HPI:  Patient here with covid.  Symptoms started 4 days ago. Initial test for covid was negative but tested positive yesterday. Has had a few family members with similar symptoms. Symptoms include congestion, cough, rhinorrhe, malaise, and fatigue. No shortness of breath.  Symptoms have worsened slightly last couple of days.       Objective/Observations  Physical Exam: Gen: NAD, resting comfortably Pulm: Normal work of breathing Neuro: Grossly normal, moves all extremities Psych: Normal affect and thought content  Virtual Visit via Video   I connected with Patrick Hatfield on 08/15/21 at  3:40 PM EST by a video enabled telemedicine application and verified that I am speaking with the correct person using two identifiers. The limitations of evaluation and management by telemedicine and the availability of in person appointments were discussed. The patient expressed understanding and agreed to proceed.   Patient location: Home Provider location: [place] participating in the virtual visit: Myself and Patient     Algis Greenhouse. Jerline Pain, MD 08/15/2021 12:02 PM

## 2021-08-19 ENCOUNTER — Encounter: Payer: Self-pay | Admitting: Family Medicine

## 2021-08-27 ENCOUNTER — Other Ambulatory Visit: Payer: Self-pay

## 2021-08-27 ENCOUNTER — Ambulatory Visit (INDEPENDENT_AMBULATORY_CARE_PROVIDER_SITE_OTHER): Payer: Medicare Other

## 2021-08-27 DIAGNOSIS — Z7901 Long term (current) use of anticoagulants: Secondary | ICD-10-CM

## 2021-08-27 LAB — POCT INR: INR: 3.2 — AB (ref 2.0–3.0)

## 2021-08-27 NOTE — Patient Instructions (Addendum)
Pre visit review using our clinic review tool, if applicable. No additional management support is needed unless otherwise documented below in the visit note.  Only take 1/2 tablet today and then continue to take 1/2 tablet all days except 1 tablet on Tuesday and Saturdays.  Re-check in 8 weeks

## 2021-08-27 NOTE — Progress Notes (Signed)
Only take 1/2 tablet today and then continue to take 1/2 tablet all days except 1 tablet on Tuesday and Saturdays.  Re-check in 8 weeks

## 2021-08-27 NOTE — Progress Notes (Signed)
Patient ID: Patrick Hatfield, male   DOB: [birth year], 87 y.o.   MRN: [id number]  Medical screening examination/treatment/procedure(s) were performed by non-physician practitioner and as supervising physician I was immediately available for consultation/collaboration.  I agree with above. Cathlean Cower, MD

## 2021-09-01 ENCOUNTER — Other Ambulatory Visit: Payer: Self-pay | Admitting: Family Medicine

## 2021-09-01 DIAGNOSIS — M62838 Other muscle spasm: Secondary | ICD-10-CM

## 2021-09-04 ENCOUNTER — Other Ambulatory Visit: Payer: Self-pay | Admitting: Family

## 2021-09-05 ENCOUNTER — Encounter: Payer: Self-pay | Admitting: Family Medicine

## 2021-09-06 DIAGNOSIS — R350 Frequency of micturition: Secondary | ICD-10-CM | POA: Diagnosis not present

## 2021-09-06 DIAGNOSIS — R3915 Urgency of urination: Secondary | ICD-10-CM | POA: Diagnosis not present

## 2021-10-07 ENCOUNTER — Ambulatory Visit (INDEPENDENT_AMBULATORY_CARE_PROVIDER_SITE_OTHER): Payer: Medicare Other | Admitting: Pharmacist

## 2021-10-07 DIAGNOSIS — I10 Essential (primary) hypertension: Secondary | ICD-10-CM

## 2021-10-07 DIAGNOSIS — E785 Hyperlipidemia, unspecified: Secondary | ICD-10-CM

## 2021-10-07 DIAGNOSIS — D6851 Activated protein C resistance: Secondary | ICD-10-CM

## 2021-10-07 NOTE — Patient Instructions (Signed)
Mr Sobotka, It was a pleasure speaking with you today.  I have attached a summary of our visit today and information about your health goals.    Patient Goals/Self-Care Activities Over the next 180 days, patient will:  take medications as prescribed check blood pressure weekly, document, and provide at future appointments target a minimum of 150 minutes of moderate intensity exercise weekly   If you have any questions or concerns, please feel free to contact me either at the phone number below or with a MyChart message.   Keep up the good work!  Cherre Robins, PharmD Clinical Pharmacist [facility] [phone] (direct line)  [phone] (main office number)   CARE PLAN ENTRY (see longitudinal plan of care for additional care plan information)  Current Barriers:  Chronic Disease Management support, education, and care coordination needs related to Hypertension, Hyperlipidemia, Factor 5 Leiden, Hx of Barrett's Esophagus/Hx of C.diff/GERD, BPH   Hypertension BP Readings from Last 3 Encounters:  08/15/21 130/77  07/24/21 124/80  01/31/21 122/80   Pharmacist Clinical Goal(s): Over the next 90 days, patient will work with PharmD and providers to maintain BP goal <140/90 Current regimen:  Losartan/hydrochlorothiazide 100/12.5mg  daily in the morning Propranolol LA/SR 80mg  daily (for tremors) Patient self care activities - Over the next 90 days, patient will: Check blood pressure weekly, document, and provide at future appointments Ensure daily salt intake < 2300 mg/day  Hyperlipidemia Lab Results  Component Value Date/Time   LDLCALC 164 (H) 03/02/2019 10:49 AM   LDLDIRECT 96.0 07/26/2020 11:29 AM   Pharmacist Clinical Goal(s): Over the next 90 days, patient will work with PharmD and providers to maintain LDL goal < 100, achieve TRIG goal <150 Current regimen:  Simvastatin 10mg  daily at bedtime Krill Oil 500mg  daily   Interventions: Discussed diet and exercise Patient self care activities - Over the next 90 days, patient will: Maintain cholesterol medication regimen.  Continue to walk daily with goal of at least 150 minutes of exercise per week  Factor V Leiden Mutation (Goal: maintain INR 2.0 to 3.0) Pharmacist Clinical Goal(s) Over the next 180 days, patient will work with PharmD and providers to maintain therapeutic anticoagulation Current regimen:  Warfarin 5mg  daily as directed (5mg  on Tuesdays and Satrudays and  25mg  all other days) Patient self care activities - Over the next 180 days, patient will: Continue current medication Continue to follow up with coumadin clinic for regular INR checks   Medication management Pharmacist Clinical Goal(s): Over the next 90 days, patient will work with PharmD and providers to maintain optimal medication adherence Current [facility] Comprehensive medication review performed. Continue current medication management strategy Patient self care activities - Over the next 90 days, patient will: Focus on medication adherence by filling and taking medications appropriately  Take medications as prescribed Report any questions or concerns to PharmD and/or provider(s)   Patient Goals/Self-Care Activities Over the next 180 days, patient will:  take medications as prescribed check blood pressure weekly, document, and provide at future appointments target a minimum of 150 minutes of moderate intensity exercise weekly  Patient verbalizes understanding of instructions and care plan provided today and agrees to view in [place]. Active MyChart status confirmed with patient.

## 2021-10-07 NOTE — Chronic Care Management (AMB) (Signed)
Chronic Care Management Pharmacy Note  10/07/2021 Name:  Patrick Hatfield MRN:  [id number] DOB:  1934/10/23  Summary:  Patient reports that his pharmacy has gotten about 3 months ahead on refills for losartan hydrochlorothiazide. He is taking daily but has several on hand.  Blood pressure has been well controlled.  Patient refill history shows good adherence for maintenance medications.  He did request that Linzess, Atrovent nasal spray be removed from his medication list as he is no longer taking either of these.   Subjective: Patrick Hatfield is an 87 y.o. year old male who is a primary patient of Copland, Gay Filler, MD.  The CCM team was consulted for assistance with disease management and care coordination needs.    Engaged with patient by telephone for follow up visit in response to provider referral for pharmacy case management and/or care coordination services.   Consent to Services:  The patient was given information about Chronic Care Management services, agreed to services, and gave verbal consent prior to initiation of services.  Please see initial visit note for detailed documentation.   Patient Care Team: Copland, Gay Filler, MD as PCP - General (Family Medicine) Latanya Maudlin, MD as Consulting Physician (Orthopedic Surgery) Gatha Mayer, MD as Consulting Physician (Gastroenterology) Marshell Garfinkel, MD as Consulting Physician (Pulmonary Disease) Cherre Robins, RPH-CPP (Pharmacist)  Recent office visits: 08/15/2021 - [facility] (Dr Jerline Pain) Video Visit. COVID positive. Prescribed molnupiravir 851m twice a day 07/24/2021 - [facility]am [facility] (Dr CLorelei Pont Six month f/u. No med changes noted. Lab normal except slight increase in PSA. Referred to urology for evaluation.    Recent consult visit: 08/27/2021 [facility] INR is 3.2. Take 2.537mwarfarin today, then restart 2.5m33maily except 5mg16m Tuesdays and Saturdays. 07/02/2021 - Anticoag Visit - INR was 2.3 (81.9% TTR) no  change in warfarin dose 04/09/2021 - Anticoagulation clinic. ([facility] INR was 2.5. No med changes. Continued warfarin 5mg 43mTuesdays; 2.5mg a12mother days. F/U 6 weeks. 04/04/2021 - GI Phone call  - lowered Linzess dose to 72mcg 65my - give 2 weeks of samples.    Hospital visits: None in the last 6 months  Objective:  Lab Results  Component Value Date   CREATININE 0.87 07/24/2021   CREATININE 0.88 01/31/2021   CREATININE 0.86 07/26/2020    Lab Results  Component Value Date   HGBA1C 5.2 03/02/2019   Last diabetic Eye exam: No results found for: HMDIABEYEEXA  Last diabetic Foot exam: No results found for: HMDIABFOOTEX      Component Value Date/Time   CHOL 176 07/26/2020 1129   TRIG 267.0 (H) 07/26/2020 1129   HDL 36.10 (L) 07/26/2020 1129   CHOLHDL 5 07/26/2020 1129   VLDL 53.4 (H) 07/26/2020 1129   LDLCALC 164 (H) 03/02/2019 1049   LDLDIRECT 96.0 07/26/2020 1129    Hepatic Function Latest Ref Rng & Units 07/24/2021 07/26/2020 12/05/2019  Total Protein 6.0 - 8.3 g/dL 7.7 7.6 7.3  Albumin 3.5 - 5.2 g/dL 4.4 4.3 3.7  AST 0 - 37 U/L '29 25 20  ' ALT 0 - 53 U/L 36 27 17  Alk Phosphatase 39 - 117 U/L 64 49 64  Total Bilirubin 0.2 - 1.2 mg/dL 2.8(H) 2.3(H) 1.5(H)  Bilirubin, Direct 0.0 - 0.3 mg/dL - - -    Lab Results  Component Value Date/Time   TSH 1.54 03/02/2019 10:49 AM   TSH 1.63 02/08/2014 10:12 AM    CBC Latest Ref Rng & Units 07/24/2021  01/31/2021 07/26/2020  WBC 4.0 - 10.5 K/uL 6.3 7.3 6.4  Hemoglobin 13.0 - 17.0 g/dL 15.7 15.7 15.1  Hematocrit 39.0 - 52.0 % 46.6 46.8 44.7  Platelets 150.0 - 400.0 K/uL 134.0(L) 135.0(L) 124.0(L)    No results found for: VD25OH  Clinical ASCVD: No  The ASCVD Risk score (Arnett DK, et al., 2019) failed to calculate for the following reasons:   The 2019 ASCVD risk score is only valid for ages 93 to 20     Social History   Tobacco Use  Smoking Status Former   Packs/day: 2.00   Years: 40.00   Pack years: 80.00    Types: Cigarettes   Quit date: 08/18/1994   Years since quitting: 27.1  Smokeless Tobacco Never  Tobacco Comments   smoked 1956-1996, up to 2 ppd   BP Readings from Last 3 Encounters:  08/15/21 130/77  07/24/21 124/80  01/31/21 122/80   Pulse Readings from Last 3 Encounters:  08/15/21 71  07/24/21 72  01/31/21 77   Wt Readings from Last 3 Encounters:  07/24/21 202 lb 3.2 oz (91.7 kg)  05/15/21 210 lb (95.3 kg)  01/31/21 204 lb (92.5 kg)    Assessment: Review of patient past medical history, allergies, medications, health status, including review of consultants reports, laboratory and other test data, was performed as part of comprehensive evaluation and provision of chronic care management services.   SDOH:  (Social Determinants of Health) assessments and interventions performed:  SDOH Interventions    Flowsheet Row Most Recent Value  SDOH Interventions   Financial Strain Interventions Intervention Not Indicated  Physical Activity Interventions Intervention Not Indicated  Transportation Interventions Intervention Not Indicated        CCM Care Plan  Allergies  Allergen Reactions   Amlodipine Besy-Benazepril Hcl     REACTION: swelling of feet.  Probably  from Amlodipine component)   Lipitor [Atorvastatin Calcium]     ? Reaction, ? Swelling=- update 7/20.  Pt recalls his legs hurt and perhaps swelled slightly - JC    Medications Reviewed Today     Reviewed by Cherre Robins, RPH-CPP (Pharmacist) on 10/07/21 at 1320  Med List Status: <None>   Medication Order Taking? Sig Documenting Provider Last Dose Status Informant  acetaminophen (TYLENOL) 500 MG tablet [id number] Yes Take 1,000 mg by mouth 2 (two) times daily as needed for moderate pain, fever or headache. [provider] Taking Active            Med Note Antony Contras, Jenene Slicker Oct 07, 2021  1:18 PM)    Al Hyd-Mg Tr-Alg Ac-Sod Bicarb (GAVISCON-2 PO) [id number] Yes Take 0.5-1 tablets by mouth as needed.  [provider] Taking Active            Med Note Antony Contras, Jenene Slicker Oct 07, 2021  1:18 PM)    cholecalciferol (VITAMIN D) 1000 UNITS tablet [id number] Yes Take 1,000 Units by mouth daily. [provider] Taking Active   diazepam (VALIUM) 5 MG tablet [id number] Yes TAKE 1/2 TO 1 TABLET BY MOUTH AT BEDTIME AS NEEDED FOR SLEEP Copland, Gay Filler, MD Taking Active   famotidine (PEPCID) 10 MG tablet [id number] Yes Take 10 mg by mouth 2 (two) times daily as needed for heartburn or indigestion. [provider] Taking Active   Fluticasone Propionate (FLONASE NA) [id number] Yes Place into the nose as needed. [provider] Taking Active            Med  Note [name]   Fri Mar 30, 2020 11:13 AM) As needed  folic acid (FOLVITE) 502 MCG tablet [id number] Yes Take 400 mcg by mouth daily. [provider] Taking Active   Krill Oil 500 MG CAPS [id number] Yes Take 500 mg by mouth daily. [provider] Taking Active   losartan-hydrochlorothiazide (HYZAAR) 100-12.5 MG tablet [id number] Yes TAKE 1 TABLET BY MOUTH EVERY DAY Copland, Gay Filler, MD Taking Active   propranolol ER (INDERAL LA) 80 MG 24 hr capsule [id number] Yes TAKE 1 CAPSULE BY MOUTH EVERY DAY Copland, Gay Filler, MD Taking Active   simvastatin (ZOCOR) 10 MG tablet [id number] Yes TAKE 1 TABLET BY MOUTH EVERYDAY AT BEDTIME Copland, Gay Filler, MD Taking Active   warfarin (COUMADIN) 5 MG tablet [id number] Yes TAKE AS DIRECTED BY ANTICOAGULATION CLINIC Copland, Gay Filler, MD Taking Active             Patient Active Problem List   Diagnosis Date Noted   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    Thrombocytopenia, unspecified ([place]) 01/05/2013   Gilbert syndrome 01/05/2013   Unspecified adverse effect of unspecified drug, medicinal and biological substance 01/09/2012   Barrett's esophagus 03/24/2011   Factor 5 Leiden mutation, heterozygous ([place]) 03/21/2011   Essential tremor 04/24/2009   Hyperlipidemia 10/03/2008   Essential hypertension 10/03/2008   GERD 10/03/2008   Fasting hyperglycemia 10/03/2008   DVT, HX OF 10/03/2008    Immunization History  Administered Date(s) Administered   DT (Pediatric) 02/16/2015   Fluad Quad(high Dose 65+) 05/10/2019, 05/17/2020   Influenza Split 05/03/2012   Influenza Whole 07/24/2010   Influenza, High Dose Seasonal PF 05/20/2013, 05/09/2016, 05/15/2017, 04/20/2018   Influenza,inj,Quad PF,6+ Mos 05/03/2014, 05/16/2015   Influenza-Unspecified 06/24/2021   PFIZER(Purple Top)SARS-COV-2 Vaccination 08/27/2019, 09/17/2019, 07/31/2020   Pneumococcal Conjugate-13 09/05/2015   Pneumococcal Polysaccharide-23 09/26/2016   Td 03/03/2015   Tdap 03/08/2015   Zoster Recombinat (Shingrix) 02/22/2018, 10/05/2018    Conditions to be addressed/monitored: HTN, HLD, and tremor; chronic constipation; GERD with Barrett's esophagus; chronic anticoagulation due to Factor V Leiden Deficiency  Care Plan : General Pharmacy (Adult)  Updates made by Cherre Robins, RPH-CPP since 10/07/2021 12:00 AM     Problem: Hypertension, Hyperlipidemia, Factor 5 Leiden, Hx of Barrett's Esophagus/Hx of C.diff/GERD,   Priority: High  Onset Date: 10/02/2020     Long-Range Goal: Provide education, support and care coordination for medication therapy and chronic conditions   Start Date: 10/02/2020  Expected End Date: 04/01/2021  This Visit's Progress: On track  Recent Progress: On track  Priority: High  Note:   Current Barriers:  Unable to achieve control of lipids  Several medications on med list that he has stopped (mostly as needed medications)  Pharmacist Clinical Goal(s):   Over the next 180 days, patient will achieve adherence to monitoring guidelines and medication adherence to achieve therapeutic efficacy achieve control of lipids  as evidenced by updated lipid panel through collaboration with PharmD and provider.  Coordinate with gastroenterology office to lower dose of Linzess or find alternative.   Interventions: 1:1 collaboration with Copland, Gay Filler, MD regarding development and update of comprehensive plan of care as evidenced by provider attestation and co-signature Inter-disciplinary care team collaboration (see  longitudinal plan of care) Comprehensive medication review performed; medication list updated in electronic medical record  Hypertension (BP goal <140/90) Controlled Current treatment: Losartan/hydrochlorothiazide  100/12.56m daily AM Propranolol LA 846mdaily (for tremors) Medications previously tried: amlodipine-benazepril (swelling in feet)  Current home readings: 116 to 130 / 70 to 81 Current exercise habits: walking daily for about 20 minutes Denies hypotensive/hypertensive symptoms. He did have a fall 01/18/2021 but reports it was due to tripping on door frame and not due to dizziness. Interventions:  Reviewed blood pressure  goals and benefits of medications for prevention of heart attack, stroke and kidney damage; Continue to monitor blood pressure at home weekly, document, and provide log at future appointments Recommended to continue current medication Continue to exercise with goal of 150 minutes per week as able.  Hyperlipidemia: (LDL goal < 100) Controlled; Last LDL was 96 (07/26/2020) Tg were slightly elevated Current treatment: Simvastatin 1047mt bedtime Krill Oil 500m62mily Medications previously tried: atorvastatin (swelling)  Discontinued alcohol use 2 years ago Current exercise habits: see above Interventions:  Reviewed cholesterol goals Benefits of statin for ASCVD risk reduction; Recommended to continue  current medication Consider rechecking lipids with next labs  Factor V Leiden Mutation (Goal: maintain INR 2.0 to 3.0) Managed by anticoag clinic on [address] INR was slightly elevated at 3.2 Checks usually every 6 to 8 weeks Current treatment  Warfarin 5mg 33mly as directed (5mg o14muesdays and Saturdays and  2.5mg al77mther days) Medications previously tried:none noted Interventions:   Recommended to continue current anticoagulation medication.  Continue to follow up with anticoagulation clinic  Chronic Constipation: Improved per patient. He has stopped Linzess. States that is helped "too much" caused diarrhea Current therapy:  OTC stool softener as needed Managed by Dr KennedyBerniece Paper medications tried: Linzess 145mcg a55m2mcg - 55mking too well" / had loose stools.  Interventions:  Patient doing well - continue over-the-counter stool softener as needed   Patient Goals/Self-Care Activities Over the next 180 days, patient will:  take medications as prescribed check blood pressure weekly, document, and provide at future appointments target a minimum of 150 minutes of moderate intensity exercise weekly  Follow Up Plan: Clinical pharmacist will follow up in 6 months, unless needed sooner.        Medication Assistance: None needed, patient endorses he is able to afford current medication regimen.   Patient's preferred pharmacy is:  [id number] [phone]:  Patient agrees to Care Plan and Follow-up.  Plan: Telephone follow up appointment with care management team member scheduled for:   6 months with pharmacist unless needed sooner.   Maryclaire Stoecker EckaCherre Robinslinical Pharmacist [place] [facility]

## 2021-10-11 ENCOUNTER — Other Ambulatory Visit: Payer: Self-pay | Admitting: Family Medicine

## 2021-10-12 ENCOUNTER — Encounter: Payer: Self-pay | Admitting: Family Medicine

## 2021-10-13 NOTE — Progress Notes (Signed)
Therapist, music at [facility] ?[address], [address] ?[place], [place] [address] ?336 [phone] ?Fax [phone] ? ?Date:  10/16/2021  ? ?Name:  Patrick Hatfield   DOB:  03-Oct-1934   MRN:  [id number] ? ?PCP:  Patrick Mclean, MD  ? ? ?Chief Complaint: Cyst (Pt c/o a cyst on the back of the neck x years but is becoming more uncomfortable and becoming larger. ) ? ? ?History of Present Illness: ? ?Patrick Hatfield is a 87 y.o. very pleasant male patient who presents with the following: ? 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 ?Most recent visit with myself was in December for routine follow-up-time he was noted to have a sebaceous cyst on his neck.  At this time patient notes the cyst has become tender and enlarged, here today for evaluation ? ?He is otherwise feeling well, recently celebrated his 87th birthday.  He is accompanied today by his wife Patrick Hatfield ? ?Discussed possible incision and drainage of sebaceous cyst.  I advised patient that the cyst may come back after treatment.  He would like to proceed ? ? ? ? ?Patient Active Problem List  ? Diagnosis Date Noted  ? [name] diarrhea 12/28/2019  ? Hearing loss 01/26/2018  ? Long term (current) use of anticoagulants 05/15/2017  ? Encounter for therapeutic drug monitoring 12/05/2016  ? Insomnia 09/05/2015  ? Diarrhea 09/05/2015  ? Hx of adenomatous colonic polyps 12/14/2014  ? Hematuria, gross 02/03/2014  ? Thrombosis of mesenteric vein (HCC) 07/12/2013  ? Thrombocytopenia, unspecified ([place]) 01/05/2013  ? Rosanna Randy syndrome 01/05/2013  ? Unspecified adverse effect of unspecified drug, medicinal and biological substance 01/09/2012  ? Barrett's esophagus 03/24/2011  ? Factor 5 Leiden mutation, heterozygous ([place]) 03/21/2011  ? Essential tremor 04/24/2009  ? Hyperlipidemia 10/03/2008  ? Essential  hypertension 10/03/2008  ? GERD 10/03/2008  ? Fasting hyperglycemia 10/03/2008  ? DVT, HX OF 10/03/2008  ? ? ?Past Medical History:  ?Diagnosis Date  ? Anxiety   ? Arthritis   ? KNEES,ANKLES,HANDS  ? Asthma   ? AS A TEENAGER  ? Barrett's esophagus   ? Benign essential tremor   ? Clotting disorder ([place])   ? FACTOR 5  ? Colitis in 20's  ? Colon polyps   ? Congenital deficiency of other clotting factors   ? factor 5  ? Depression 2006  ? drug overdose  ? Diverticulosis   ? DVT (deep venous thrombosis) ([place]) 1991 & 2010  ? X 2 in context Factor 5 def  ? Esophageal reflux   ? Factor 5 Leiden mutation, heterozygous ([place])   ? Gilbert's syndrome   ? H/O ischemic bowel disease 2010  ? while off warfarin   ? Herpes zoster 2010  ? facial  ? Hiatal hernia   ? Hx of adenomatous colonic polyps 12/14/2014  ? Hyperlipidemia   ? Hypertension   ? Insomnia   ? Osteoporosis   ? Personal history of colonic polyps 05/28/2007  ? adenomatous  ? Proteinuria age 61  ? Thrombosis of mesenteric vein ([place]) 07/12/2013  ? 03/2009  ? Umbilical hernia   ? ? ?Past Surgical History:  ?Procedure Laterality Date  ? CHOLECYSTECTOMY  05/16/2011  ?  Dr Marlou Starks  ? COLONOSCOPY    ? Dr Sharlett Iles  ? CYSTOSCOPY    ? Neg  ? EYE SURGERY  Bilateral 10/16/2017  ? secondary catarct surgery.  ? KNEE ARTHROSCOPY Bilateral 2008, 2012  ? Dr.Aplington bilateral  ? TONSILLECTOMY    ? TRANSURETHRAL RESECTION OF PROSTATE    ?  BPH;Dr Dahlstedt  ? ? ?Social History  ? ?Tobacco Use  ? Smoking status: Former  ?  Packs/day: 2.00  ?  Years: 40.00  ?  Pack years: 80.00  ?  Types: Cigarettes  ?  Quit date: 08/18/1994  ?  Years since quitting: 27.1  ? Smokeless tobacco: Never  ? Tobacco comments:  ?  smoked 1956-1996, up to 2 ppd  ?Vaping Use  ? Vaping Use: Never used  ?Substance Use Topics  ? Alcohol use: Yes  ?  Alcohol/week: 3.0 standard drinks  ?  Types: 3 Cans of beer per week  ? Drug use: No  ? ? ?Family History  ?Problem Relation Age of Onset  ? Pancreatic cancer Father 33  ? Ulcers  Father   ? Stroke Father 86  ? Diabetes Father   ? Coronary artery disease Mother   ? Osteoporosis Mother   ? HIV Brother   ? Multiple sclerosis Sister   ? Heart attack Paternal Grandfather   ?     early 41s  ? Heart attack Paternal Uncle   ?     2 uncles in early 69s  ? Aortic aneurysm Brother   ? Colon cancer Neg Hx   ? Esophageal cancer Neg Hx   ? Stomach cancer Neg Hx   ? ? ?Allergies  ?Allergen Reactions  ? Amlodipine Besy-Benazepril Hcl   ?  REACTION: swelling of feet.  Probably  from Amlodipine component)  ? Lipitor [Atorvastatin Calcium]   ?  ? Reaction, ? Swelling=- update 7/20.  Pt recalls his legs hurt and perhaps swelled slightly - JC  ? ? ?Medication list has been reviewed and updated. ? ?Current Outpatient Medications on File Prior to Visit  ?Medication Sig Dispense Refill  ? acetaminophen (TYLENOL) 500 MG tablet Take 1,000 mg by mouth 2 (two) times daily as needed for moderate pain, fever or headache.    ? Al Hyd-Mg Tr-Alg Ac-Sod Bicarb (GAVISCON-2 PO) Take 0.5-1 tablets by mouth as needed.    ? cholecalciferol (VITAMIN D) 1000 UNITS tablet Take 1,000 Units by mouth daily.    ? diazepam (VALIUM) 5 MG tablet TAKE 1/2 TO 1 TABLET BY MOUTH AT BEDTIME AS NEEDED FOR SLEEP 30 tablet 2  ? famotidine (PEPCID) 10 MG tablet Take 10 mg by mouth 2 (two) times daily as needed for heartburn or indigestion.    ? Fluticasone Propionate (FLONASE NA) Place into the nose as needed.    ? folic acid (FOLVITE) 128 MCG tablet Take 400 mcg by mouth daily.    ? Krill Oil 500 MG CAPS Take 500 mg by mouth daily.    ? losartan-hydrochlorothiazide (HYZAAR) 100-12.5 MG tablet TAKE 1 TABLET BY MOUTH EVERY DAY 90 tablet 2  ? propranolol ER (INDERAL LA) 80 MG 24 hr capsule TAKE 1 CAPSULE BY MOUTH EVERY DAY 90 capsule 3  ? simvastatin (ZOCOR) 10 MG tablet TAKE 1 TABLET BY MOUTH EVERYDAY AT BEDTIME 90 tablet 3  ? warfarin (COUMADIN) 5 MG tablet TAKE AS DIRECTED BY ANTICOAGULATION CLINIC 360 tablet 1  ? ?No current facility-administered  medications on file prior to visit.  ? ? ?Review of Systems: ? ?As per HPI- otherwise negative. ? ? ?Physical Examination: ?Vitals:  ? 10/16/21 1132  ?BP: 112/72  ?Pulse: 65  ?Resp:  18  ?Temp: 97.8 ?F (36.6 ?C)  ?SpO2: 95%  ? ?Vitals:  ? 10/16/21 1132  ?Weight: 211 lb (95.7 kg)  ?Height: 5\' 10"  (1.778 m)  ? ?Body mass index is 30.28 kg/m?. ?Ideal Body Weight: Weight in (lb) to have BMI = 25: 173.9 ? ?GEN: no acute distress.  Looks well, his normal self ?HEENT: Atraumatic, Normocephalic.  ?Ears and Nose: No external deformity. ?CV: RRR, No M/G/R. No JVD. No thrill. No extra heart sounds. ?PULM: CTA B, no wheezes, crackles, rhonchi. No retractions. No resp. distress. No accessory muscle use ?EXTR: No c/c/e ?PSYCH: Normally interactive. Conversant.  ?There is a sebaceous cyst on the right posterior aspect of his neck.  It is approximately 3 cm in diameter with a central pore ? ?Verbal consent obtained.  Anesthetized skin overlying pore with approximately 2 cc of 1% lidocaine with epi ?11 blade used to incise over the centralized pore, sebaceous material expressed ?Wound noted to be hemostatic after treatment ?Place Band-Aid ?Patient tolerated well ?Estimated blood loss 3 mL ?Assessment and Plan: ?Sebaceous cyst ? ?Patient seen today with a sebaceous cyst ?I&D as above ?Discussed wound care verbally and gave written instructions ?They will let me know if any sign of infection ?Advised to watch for any bleeding once epinephrine wears off.  Recommend firm pressure for 5 to 10 minutes should bleeding occur.  They will let me know if any concerns ? ?Signed ?Lamar Blinks, MD ? ?

## 2021-10-15 DIAGNOSIS — I10 Essential (primary) hypertension: Secondary | ICD-10-CM | POA: Diagnosis not present

## 2021-10-15 DIAGNOSIS — E785 Hyperlipidemia, unspecified: Secondary | ICD-10-CM

## 2021-10-16 ENCOUNTER — Ambulatory Visit (INDEPENDENT_AMBULATORY_CARE_PROVIDER_SITE_OTHER): Payer: Medicare Other | Admitting: Family Medicine

## 2021-10-16 VITALS — BP 112/72 | HR 65 | Temp 97.8°F | Resp 18 | Ht 70.0 in | Wt 211.0 lb

## 2021-10-16 DIAGNOSIS — L723 Sebaceous cyst: Secondary | ICD-10-CM

## 2021-10-16 NOTE — Patient Instructions (Signed)
Good to see you today!   ?We drained a sebaceous cyst on your neck today ?Keep it clean and dry today- starting tomorrow shower and normal, then pat dry and gently squeeze to see if any more material will come out.  Then cover with band- aid.  Keep this up until healed over ? ?If any concern of about infection- red, heat, swelling, more pain- please contact me asap  ?

## 2021-10-22 ENCOUNTER — Other Ambulatory Visit: Payer: Self-pay

## 2021-10-22 ENCOUNTER — Ambulatory Visit (INDEPENDENT_AMBULATORY_CARE_PROVIDER_SITE_OTHER): Payer: Medicare Other

## 2021-10-22 DIAGNOSIS — Z7901 Long term (current) use of anticoagulants: Secondary | ICD-10-CM | POA: Diagnosis not present

## 2021-10-22 LAB — POCT INR: INR: 2.3 (ref 2.0–3.0)

## 2021-10-22 NOTE — Patient Instructions (Addendum)
Pre visit review using our clinic review tool, if applicable. No additional management support is needed unless otherwise documented below in the visit note.  Continue to take 1/2 tablet all days except 1 tablet on Tuesday and Saturdays.  Recheck in 8 weeks per pt request. 

## 2021-10-22 NOTE — Progress Notes (Signed)
Continue to take 1/2 tablet all days except 1 tablet on Tuesday and Saturdays.  Recheck in 8 weeks per pt request. 

## 2021-10-22 NOTE — Progress Notes (Signed)
Patient ID: Patrick Hatfield, male   DOB: April 11, 1935, 87 y.o.   MRN: [id number] ? ?Medical screening examination/treatment/procedure(s) were performed by non-physician practitioner and as supervising physician I was immediately available for consultation/collaboration.  I agree with above. Cathlean Cower, MD ? ?

## 2021-10-28 ENCOUNTER — Encounter: Payer: Self-pay | Admitting: Family Medicine

## 2021-10-31 DIAGNOSIS — M25552 Pain in left hip: Secondary | ICD-10-CM | POA: Diagnosis not present

## 2021-10-31 DIAGNOSIS — M7918 Myalgia, other site: Secondary | ICD-10-CM | POA: Diagnosis not present

## 2021-10-31 NOTE — Progress Notes (Signed)
Therapist, music at [facility] ?[address], [address] ?[place], [place] [address] ?336 [phone] ?Fax [phone] ? ?Date:  11/04/2021  ? ?Name:  Patrick Hatfield   DOB:  [birth year]   MRN:  [id number] ? ?PCP:  Darreld Mclean, MD  ? ? ?Chief Complaint: sebaceous cyst  (Pt was seen on 10/16/21 for a Sebaceous cyst. He feels like it is infected and needs to be drained again. ) ? ? ?History of Present Illness: ? ?Patrick Hatfield is a 87 y.o. very pleasant male patient who presents with the following: ? ?Pt seen today to recheck sebaceous cyst which we I and D on 3/1-  ?He notes some re- accumulation of sebaceous material so he came back in.  He has tried warm compresses and squeezing the area himself but has not gotten any drainage.  He otherwise feels well, no fever or chills ? ?I offered him to see a surgeon, he would like me to attempt I&D at least once more ? ?Patient Active Problem List  ? Diagnosis Date Noted  ? [name] diarrhea 12/28/2019  ? Hearing loss 01/26/2018  ? Long term (current) use of anticoagulants 05/15/2017  ? Encounter for therapeutic drug monitoring 12/05/2016  ? Insomnia 09/05/2015  ? Diarrhea 09/05/2015  ? Hx of adenomatous colonic polyps 12/14/2014  ? Hematuria, gross 02/03/2014  ? Thrombosis of mesenteric vein (HCC) 07/12/2013  ? Thrombocytopenia, unspecified ([place]) 01/05/2013  ? Rosanna Randy syndrome 01/05/2013  ? Unspecified adverse effect of unspecified drug, medicinal and biological substance 01/09/2012  ? Barrett's esophagus 03/24/2011  ? Factor 5 Leiden mutation, heterozygous ([place]) 03/21/2011  ? Essential tremor 04/24/2009  ? Hyperlipidemia 10/03/2008  ? Essential hypertension 10/03/2008  ? GERD 10/03/2008  ? Fasting hyperglycemia 10/03/2008  ? DVT, HX OF 10/03/2008  ? ? ?Past Medical History:  ?Diagnosis Date  ? Anxiety   ? Arthritis   ? KNEES,ANKLES,HANDS  ? Asthma   ? AS A TEENAGER  ? Barrett's esophagus   ? Benign essential tremor   ? Clotting disorder ([place])   ? FACTOR  5  ? Colitis in 20's  ? Colon polyps   ? Congenital deficiency of other clotting factors   ? factor 5  ? Depression 2006  ? drug overdose  ? Diverticulosis   ? DVT (deep venous thrombosis) ([place]) 1991 & 2010  ? X 2 in context Factor 5 def  ? Esophageal reflux   ? Factor 5 Leiden mutation, heterozygous ([place])   ? Gilbert's syndrome   ? H/O ischemic bowel disease 2010  ? while off warfarin   ? Herpes zoster 2010  ? facial  ? Hiatal hernia   ? Hx of adenomatous colonic polyps 12/14/2014  ? Hyperlipidemia   ? Hypertension   ? Insomnia   ? Osteoporosis   ? Personal history of colonic polyps 05/28/2007  ? adenomatous  ? Proteinuria age 89  ? Thrombosis of mesenteric vein ([place]) 07/12/2013  ? 03/2009  ? Umbilical hernia   ? ? ?Past Surgical History:  ?Procedure Laterality Date  ? CHOLECYSTECTOMY  05/16/2011  ?  Dr Marlou Starks  ? COLONOSCOPY    ? Dr Sharlett Iles  ? CYSTOSCOPY    ? Neg  ? EYE SURGERY Bilateral 10/16/2017  ? secondary catarct surgery.  ? KNEE ARTHROSCOPY Bilateral 2008, 2012  ? Dr.Aplington bilateral  ? TONSILLECTOMY    ? TRANSURETHRAL RESECTION OF PROSTATE    ?  BPH;Dr Dahlstedt  ? ? ?Social History  ? ?  Tobacco Use  ? Smoking status: Former  ?  Packs/day: 2.00  ?  Years: 40.00  ?  Pack years: 80.00  ?  Types: Cigarettes  ?  Quit date: 08/18/1994  ?  Years since quitting: 27.2  ? Smokeless tobacco: Never  ? Tobacco comments:  ?  smoked 1956-1996, up to 2 ppd  ?Vaping Use  ? Vaping Use: Never used  ?Substance Use Topics  ? Alcohol use: Yes  ?  Alcohol/week: 3.0 standard drinks  ?  Types: 3 Cans of beer per week  ? Drug use: No  ? ? ?Family History  ?Problem Relation Age of Onset  ? Pancreatic cancer Father 7  ? Ulcers Father   ? Stroke Father 12  ? Diabetes Father   ? Coronary artery disease Mother   ? Osteoporosis Mother   ? HIV Brother   ? Multiple sclerosis Sister   ? Heart attack Paternal Grandfather   ?     early 10s  ? Heart attack Paternal Uncle   ?     2 uncles in early 35s  ? Aortic aneurysm Brother   ? Colon cancer  Neg Hx   ? Esophageal cancer Neg Hx   ? Stomach cancer Neg Hx   ? ? ?Allergies  ?Allergen Reactions  ? Amlodipine Besy-Benazepril Hcl   ?  REACTION: swelling of feet.  Probably  from Amlodipine component)  ? Lipitor [Atorvastatin Calcium]   ?  ? Reaction, ? Swelling=- update 7/20.  Pt recalls his legs hurt and perhaps swelled slightly - JC  ? ? ?Medication list has been reviewed and updated. ? ?Current Outpatient Medications on File Prior to Visit  ?Medication Sig Dispense Refill  ? acetaminophen (TYLENOL) 500 MG tablet Take 1,000 mg by mouth 2 (two) times daily as needed for moderate pain, fever or headache.    ? Al Hyd-Mg Tr-Alg Ac-Sod Bicarb (GAVISCON-2 PO) Take 0.5-1 tablets by mouth as needed.    ? cholecalciferol (VITAMIN D) 1000 UNITS tablet Take 1,000 Units by mouth daily.    ? diazepam (VALIUM) 5 MG tablet TAKE 1/2 TO 1 TABLET BY MOUTH AT BEDTIME AS NEEDED FOR SLEEP 30 tablet 2  ? famotidine (PEPCID) 10 MG tablet Take 10 mg by mouth 2 (two) times daily as needed for heartburn or indigestion.    ? Fluticasone Propionate (FLONASE NA) Place into the nose as needed.    ? folic acid (FOLVITE) 440 MCG tablet Take 400 mcg by mouth daily.    ? Krill Oil 500 MG CAPS Take 500 mg by mouth daily.    ? losartan-hydrochlorothiazide (HYZAAR) 100-12.5 MG tablet TAKE 1 TABLET BY MOUTH EVERY DAY 90 tablet 2  ? predniSONE (DELTASONE) 5 MG tablet Take by mouth.    ? propranolol ER (INDERAL LA) 80 MG 24 hr capsule TAKE 1 CAPSULE BY MOUTH EVERY DAY 90 capsule 3  ? simvastatin (ZOCOR) 10 MG tablet TAKE 1 TABLET BY MOUTH EVERYDAY AT BEDTIME 90 tablet 3  ? warfarin (COUMADIN) 5 MG tablet TAKE AS DIRECTED BY ANTICOAGULATION CLINIC 360 tablet 1  ? ?No current facility-administered medications on file prior to visit.  ? ? ?Review of Systems: ? ?As per HPI- otherwise negative. ? ? ?Physical Examination: ?Vitals:  ? 11/04/21 1123  ?BP: 110/60  ?Pulse: 67  ?Resp: 18  ?Temp: 97.8 ?F (36.6 ?C)  ?SpO2: 93%  ? ?Vitals:  ? 11/04/21 1123   ?Weight: 205 lb (93 kg)  ?Height: '5\' 10"'$  (1.778 m)  ? ?  Body mass index is 29.41 kg/m?. ?Ideal Body Weight: Weight in (lb) to have BMI = 25: 173.9 ?GEN: No acute distress; alert,appropriate. ?PULM: Breathing comfortably in no respiratory distress ?PSYCH: Normally interactive.  ?There is reaccumulation of sebaceous cyst on the nape of his neck.  It is draining from a different location this time.  No redness or heat is appreciated ? ?Verbal consent obtained.  Area prepped with Betadine, alcohol, and achieved anesthesia with approximately 1 cc of 1% lidocaine with epinephrine.  11 blade used to make a three-quarter centimeter incision.  I was able to express quite a bit more sebaceous material this time and also what seems to be the cyst wall. ?Patient tolerated well with no immediate complications.  Estimated blood loss less than 5 mL.  Hemostasis confirmed before patient went home ? ? ?Assessment and Plan: ?Sebaceous cyst - Plan: cephALEXin (KEFLEX) 500 MG capsule ? ?I&D of sebaceous cyst as above.  I did provide a 5-day prescription for Keflex to help prevent any infection.  I asked patient to keep me posted about his progress and he agrees to do so.  Discussed wound management and also gave written instructions ? ?Signed ?Lamar Blinks, MD ? ?

## 2021-11-04 ENCOUNTER — Ambulatory Visit (INDEPENDENT_AMBULATORY_CARE_PROVIDER_SITE_OTHER): Payer: Medicare Other | Admitting: Family Medicine

## 2021-11-04 VITALS — BP 110/60 | HR 67 | Temp 97.8°F | Resp 18 | Ht 70.0 in | Wt 205.0 lb

## 2021-11-04 DIAGNOSIS — L723 Sebaceous cyst: Secondary | ICD-10-CM

## 2021-11-04 MED ORDER — CEPHALEXIN 500 MG PO CAPS: 500.0000 mg | ORAL_CAPSULE | Freq: Three times a day (TID) | ORAL | 0 refills | Status: DC

## 2021-11-04 NOTE — Patient Instructions (Signed)
It was good to see you again today, we got quite a bit more material from your cyst so I hope it will not come back ?If it is bleeding at all, apply pressure for 10 to 15 minutes.  You might also try an ice pack on the back of her neck.  If any excessive bleeding please contact me ? ?Keep area bandaged today, tomorrow you can shower as usual and agree bandaged.  Keep covered until it seems adequately healed to be left open ? ?We will use Keflex 3 times a day for 5 days for any infection ?

## 2021-12-17 ENCOUNTER — Ambulatory Visit (INDEPENDENT_AMBULATORY_CARE_PROVIDER_SITE_OTHER): Payer: Medicare Other

## 2021-12-17 DIAGNOSIS — Z7901 Long term (current) use of anticoagulants: Secondary | ICD-10-CM

## 2021-12-17 LAB — POCT INR: INR: 2.1 (ref 2.0–3.0)

## 2021-12-17 NOTE — Progress Notes (Signed)
Continue to take 1/2 tablet all days except 1 tablet on Tuesday and Saturdays.  Recheck in 8 weeks per pt request. 

## 2021-12-17 NOTE — Patient Instructions (Addendum)
Pre visit review using our clinic review tool, if applicable. No additional management support is needed unless otherwise documented below in the visit note.  Continue to take 1/2 tablet all days except 1 tablet on Tuesday and Saturdays.  Recheck in 8 weeks per pt request. 

## 2022-01-19 NOTE — Patient Instructions (Incomplete)
Good to see you again today- please see me in about 6 months assuming all is well  You are doing great! Keep up the good work

## 2022-01-19 NOTE — Progress Notes (Addendum)
[facility] [place], [place], [place] [address] [phone]  Date:  01/22/2022   Name:  Patrick Hatfield   DOB:  [birth year]   MRN:  [id number]  PCP:  Darreld Mclean, MD    Chief Complaint: 6 month follow up (Concerns/ questions: none/)   History of Present Illness:  Patrick Hatfield is a 87 y.o. very pleasant male patient who presents with the following:  Pt seen today for recheck visit -  history of factor V Leiden, DVT, mesenteric vein thrombosis currently on Coumadin, essential tremor, hypertension, hyperlipidemia He goes to anticoagulation clinic He notes he has gained a bit of weight, his dog has been sick so they are walking less He likes to walk for exercise and he tries to be generally active They have 4 daughters and 4 grands -   Wt Readings from Last 3 Encounters:  01/22/22 212 lb 6.4 oz (96.3 kg)  11/04/21 205 lb (93 kg)  10/16/21 211 lb (95.7 kg)   Last seen by myself in March for repeat I&D of a sebaceous cyst on his neck - it stayed gone this time   Covid booster needed- suggested to him today  Labs done in December   He is seen by urology at [facility] for PSA- he would like me to check this for him today  He was last seen 6-12 months ago; he is not quite sure  Lab Results  Component Value Date   PSA 2.56 07/24/2021   PSA 2.41 01/31/2021   PSA 1.83 01/23/2020   He quit smoking in the 90s He drinks very little alcohol  He is on propranolol la for his tremor   Overall [name] feels that he is doing very well for his age.  He is careful to avoid falls.  He notes he is slowing down a bit but feels this is normal for age   Patient Active Problem List   Diagnosis Date Noted   [name] diarrhea 12/28/2019   Hearing loss 01/26/2018   Long term (current) use of anticoagulants 05/15/2017   Encounter for therapeutic drug monitoring 12/05/2016   Insomnia 09/05/2015   Diarrhea 09/05/2015   Hx  of adenomatous colonic polyps 12/14/2014   Hematuria, gross 02/03/2014   Thrombosis of mesenteric vein ([place]) 07/12/2013   Thrombocytopenia, unspecified ([place]) 01/05/2013   Rosanna Randy syndrome 01/05/2013   Unspecified adverse effect of unspecified drug, medicinal and biological substance 01/09/2012   Barrett's esophagus 03/24/2011   Factor 5 Leiden mutation, heterozygous ([place]) 03/21/2011   Essential tremor 04/24/2009   Hyperlipidemia 10/03/2008   Essential hypertension 10/03/2008   GERD 10/03/2008   Fasting hyperglycemia 10/03/2008   DVT, HX OF 10/03/2008    Past Medical History:  Diagnosis Date   Anxiety    Arthritis    KNEES,ANKLES,HANDS   Asthma    AS A TEENAGER   Barrett's esophagus    Benign essential tremor    Clotting disorder ([place])    FACTOR 5   Colitis in 20's   Colon polyps    Congenital deficiency of other clotting factors    factor 5   Depression 2006   drug overdose   Diverticulosis    DVT (deep venous thrombosis) ([place]) 1991 & 2010   X 2 in context Factor 5 def   Esophageal reflux    Factor 5 Leiden mutation, heterozygous ([place])    Gilbert's syndrome    H/O ischemic bowel  disease 2010   while off warfarin    Herpes zoster 2010   facial   Hiatal hernia    Hx of adenomatous colonic polyps 12/14/2014   Hyperlipidemia    Hypertension    Insomnia    Osteoporosis    Personal history of colonic polyps 05/28/2007   adenomatous   Proteinuria age 87   Thrombosis of mesenteric vein ([place]) 03/50/0938   08/8297   Umbilical hernia     Past Surgical History:  Procedure Laterality Date   CHOLECYSTECTOMY  05/16/2011    Dr Marlou Starks   COLONOSCOPY     Dr Sharlett Iles   CYSTOSCOPY     Neg   EYE SURGERY Bilateral 10/16/2017   secondary catarct surgery.   KNEE ARTHROSCOPY Bilateral 2008, 2012   Dr.Aplington bilateral   TONSILLECTOMY     TRANSURETHRAL RESECTION OF PROSTATE      [facility];Dr Dahlstedt    Social History   Tobacco Use   Smoking status: Former    Packs/day:  2.00    Years: 40.00    Pack years: 80.00    Types: Cigarettes    Quit date: 08/18/1994    Years since quitting: 27.4   Smokeless tobacco: Never   Tobacco comments:    smoked 1956-1996, up to 2 ppd  Vaping Use   Vaping Use: Never used  Substance Use Topics   Alcohol use: Yes    Alcohol/week: 3.0 standard drinks    Types: 3 Cans of beer per week   Drug use: No    Family History  Problem Relation Age of Onset   Pancreatic cancer Father 74   Ulcers Father    Stroke Father 58   Diabetes Father    Coronary artery disease Mother    Osteoporosis Mother    HIV Brother    Multiple sclerosis Sister    Heart attack Paternal Grandfather        early 35s   Heart attack Paternal Uncle        2 uncles in early 3s   Aortic aneurysm Brother    Colon cancer Neg Hx    Esophageal cancer Neg Hx    Stomach cancer Neg Hx     Allergies  Allergen Reactions   Amlodipine Besy-Benazepril Hcl     REACTION: swelling of feet.  Probably  from Amlodipine component)   Lipitor [Atorvastatin Calcium]     ? Reaction, ? Swelling=- update 7/20.  Pt recalls his legs hurt and perhaps swelled slightly - JC    Medication list has been reviewed and updated.  Current Outpatient Medications on File Prior to Visit  Medication Sig Dispense Refill   acetaminophen (TYLENOL) 500 MG tablet Take 1,000 mg by mouth 2 (two) times daily as needed for moderate pain, fever or headache.     Al Hyd-Mg Tr-Alg Ac-Sod Bicarb (GAVISCON-2 PO) Take 0.5-1 tablets by mouth as needed.     cholecalciferol (VITAMIN D) 1000 UNITS tablet Take 1,000 Units by mouth daily.     diazepam (VALIUM) 5 MG tablet TAKE 1/2 TO 1 TABLET BY MOUTH AT BEDTIME AS NEEDED FOR SLEEP 30 tablet 2   famotidine (PEPCID) 10 MG tablet Take 10 mg by mouth 2 (two) times daily as needed for heartburn or indigestion.     Fluticasone Propionate (FLONASE NA) Place into the nose as needed.     folic acid (FOLVITE) 371 MCG tablet Take 400 mcg by mouth daily.      Krill Oil 500 MG CAPS Take 500  mg by mouth daily.     losartan-hydrochlorothiazide (HYZAAR) 100-12.5 MG tablet TAKE 1 TABLET BY MOUTH EVERY DAY 90 tablet 2   propranolol ER (INDERAL LA) 80 MG 24 hr capsule TAKE 1 CAPSULE BY MOUTH EVERY DAY 90 capsule 3   simvastatin (ZOCOR) 10 MG tablet TAKE 1 TABLET BY MOUTH EVERYDAY AT BEDTIME 90 tablet 3   warfarin (COUMADIN) 5 MG tablet TAKE AS DIRECTED BY ANTICOAGULATION CLINIC 360 tablet 1   No current facility-administered medications on file prior to visit.    Review of Systems:  As per HPI- otherwise negative.   Physical Examination: Vitals:   01/22/22 1028  BP: 118/64  Pulse: 91  Resp: 18  Temp: 97.6 F (36.4 C)  SpO2: 95%   Vitals:   01/22/22 1028  Weight: 212 lb 6.4 oz (96.3 kg)  Height: '5\' 10"'$  (1.778 m)   Body mass index is 30.48 kg/m. Ideal Body Weight: Weight in (lb) to have BMI = 25: 173.9  GEN: no acute distress.  Mildly obese, looks well Mild head tremor is noted HEENT: Atraumatic, Normocephalic.  Ears and Nose: No external deformity. CV: RRR, No M/G/R. No JVD. No thrill. No extra heart sounds. PULM: CTA B, no wheezes, crackles, rhonchi. No retractions. No resp. distress. No accessory muscle use. ABD: S, NT, ND,. No rebound. No HSM. EXTR: No c/c/e PSYCH: Normally interactive. Conversant.    Assessment and Plan: Essential hypertension - Plan: Basic metabolic panel, CBC  Hyperlipidemia, unspecified hyperlipidemia type  Hyperbilirubinemia - Plan: Bilirubin, fractionated(tot/dir/indir)  Screening for prostate cancer - Plan: PSA, Medicare ( [place] only)  Essential tremor  Following up today.  Blood pressure under good control, continue medication Essential tremor treated with beta-blocker, symptoms minimal Follow-up on elevated bilirubin today, Guilbert syndrome suspected Assuming all is well recheck 6 months  Signed Lamar Blinks, MD  Addendum 6/8, received labs as below.  Message to  patient  Results for orders placed or performed in visit on 70/78/67  Basic metabolic panel  Result Value Ref Range   Sodium 135 135 - 145 mEq/L   Potassium 4.2 3.5 - 5.1 mEq/L   Chloride 98 96 - 112 mEq/L   CO2 28 19 - 32 mEq/L   Glucose, Bld 97 70 - 99 mg/dL   BUN 15 6 - 23 mg/dL   Creatinine, Ser 0.81 0.40 - 1.50 mg/dL   GFR 80.53 >60.00 mL/min   Calcium 9.6 8.4 - 10.5 mg/dL  CBC  Result Value Ref Range   WBC 6.7 4.0 - 10.5 K/uL   RBC 4.73 4.22 - 5.81 Mil/uL   Platelets 127.0 (L) 150.0 - 400.0 K/uL   Hemoglobin 15.2 13.0 - 17.0 g/dL   HCT 45.1 39.0 - 52.0 %   MCV 95.3 78.0 - 100.0 fl   MCHC 33.8 30.0 - 36.0 g/dL   RDW 13.9 11.5 - 15.5 %  PSA, Medicare ( [place] only)  Result Value Ref Range   PSA 2.57 0.10 - 4.00 ng/ml  Extra Specimen  Result Value Ref Range   Extra tube recieved     Specimen type recieved [employer]

## 2022-01-22 ENCOUNTER — Ambulatory Visit (INDEPENDENT_AMBULATORY_CARE_PROVIDER_SITE_OTHER): Payer: Medicare Other | Admitting: Family Medicine

## 2022-01-22 VITALS — BP 118/64 | HR 91 | Temp 97.6°F | Resp 18 | Ht 70.0 in | Wt 212.4 lb

## 2022-01-22 DIAGNOSIS — Z125 Encounter for screening for malignant neoplasm of prostate: Secondary | ICD-10-CM

## 2022-01-22 DIAGNOSIS — E785 Hyperlipidemia, unspecified: Secondary | ICD-10-CM | POA: Diagnosis not present

## 2022-01-22 DIAGNOSIS — I10 Essential (primary) hypertension: Secondary | ICD-10-CM

## 2022-01-22 DIAGNOSIS — G250 Essential tremor: Secondary | ICD-10-CM

## 2022-01-22 LAB — PSA, MEDICARE: PSA: 2.57 ng/ml (ref 0.10–4.00)

## 2022-01-22 LAB — BASIC METABOLIC PANEL
BUN: 15 mg/dL (ref 6–23)
CO2: 28 mEq/L (ref 19–32)
Calcium: 9.6 mg/dL (ref 8.4–10.5)
Chloride: 98 mEq/L (ref 96–112)
Creatinine, Ser: 0.81 mg/dL (ref 0.40–1.50)
GFR: 80.53 mL/min (ref >60.00)
Glucose, Bld: 97 mg/dL (ref 70–99)
Potassium: 4.2 mEq/L (ref 3.5–5.1)
Sodium: 135 mEq/L (ref 135–145)

## 2022-01-22 LAB — CBC
HCT: 45.1 % (ref 39.0–52.0)
Hemoglobin: 15.2 g/dL (ref 13.0–17.0)
MCHC: 33.8 g/dL (ref 30.0–36.0)
MCV: 95.3 fl (ref 78.0–100.0)
Platelets: 127 10*3/uL — ABNORMAL LOW (ref 150.0–400.0)
RBC: 4.73 Mil/uL (ref 4.22–5.81)
RDW: 13.9 % (ref 11.5–15.5)
WBC: 6.7 10*3/uL (ref 4.0–10.5)

## 2022-01-23 ENCOUNTER — Encounter: Payer: Self-pay | Admitting: Family Medicine

## 2022-01-24 LAB — EXTRA SPECIMEN

## 2022-01-24 LAB — BILIRUBIN, FRACTIONATED(TOT/DIR/INDIR)
Bilirubin, Direct: 0.4 mg/dL — ABNORMAL HIGH (ref 0.0–0.2)
Indirect Bilirubin: 2.6 mg/dL (calc) — ABNORMAL HIGH (ref 0.2–1.2)
Total Bilirubin: 3 mg/dL — ABNORMAL HIGH (ref 0.2–1.2)

## 2022-01-25 ENCOUNTER — Encounter: Payer: Self-pay | Admitting: Family Medicine

## 2022-02-11 ENCOUNTER — Ambulatory Visit (INDEPENDENT_AMBULATORY_CARE_PROVIDER_SITE_OTHER): Payer: Medicare Other

## 2022-02-11 DIAGNOSIS — Z7901 Long term (current) use of anticoagulants: Secondary | ICD-10-CM | POA: Diagnosis not present

## 2022-02-11 LAB — POCT INR: INR: 2.7 (ref 2.0–3.0)

## 2022-02-25 ENCOUNTER — Encounter: Payer: Self-pay | Admitting: Family Medicine

## 2022-03-10 ENCOUNTER — Encounter: Payer: Self-pay | Admitting: Family Medicine

## 2022-03-24 ENCOUNTER — Other Ambulatory Visit: Payer: Self-pay | Admitting: Family Medicine

## 2022-03-24 DIAGNOSIS — I10 Essential (primary) hypertension: Secondary | ICD-10-CM

## 2022-04-07 ENCOUNTER — Ambulatory Visit (INDEPENDENT_AMBULATORY_CARE_PROVIDER_SITE_OTHER): Payer: Medicare Other | Admitting: Pharmacist

## 2022-04-07 DIAGNOSIS — E785 Hyperlipidemia, unspecified: Secondary | ICD-10-CM

## 2022-04-07 DIAGNOSIS — I10 Essential (primary) hypertension: Secondary | ICD-10-CM

## 2022-04-07 DIAGNOSIS — K219 Gastro-esophageal reflux disease without esophagitis: Secondary | ICD-10-CM

## 2022-04-07 NOTE — Chronic Care Management (AMB) (Signed)
Chronic Care Management Pharmacy Note  04/07/2022 Name:  Patrick Hatfield MRN:  [id number] DOB:  Apr 25, 1935  Summary:  Patient reports that his pharmacy has gotten about 3 months ahead on refills for losartan hydrochlorothiazide. He is taking daily but has several on hand. He has refilled on time in 2023.  Blood pressure has been well controlled.  Updated medication list - added over-the-counter vitamin B12. Consider checking serum B12 at next labs.   Subjective: Patrick Hatfield is an 87 y.o. year old male who is a primary patient of Copland, Gay Filler, MD.  The CCM team was consulted for assistance with disease management and care coordination needs.    Engaged with patient by telephone for follow up visit in response to provider referral for pharmacy case management and/or care coordination services.   Consent to Services:  The patient was given information about Chronic Care Management services, agreed to services, and gave verbal consent prior to initiation of services.  Please see initial visit note for detailed documentation.   Patient Care Team: Copland, Gay Filler, MD as PCP - General (Family Medicine) Latanya Maudlin, MD as Consulting Physician (Orthopedic Surgery) Gatha Mayer, MD as Consulting Physician (Gastroenterology) Marshell Garfinkel, MD as Consulting Physician (Pulmonary Disease)  Recent office visits: 01/22/2022 Fam Med (Dr Lorelei Pont) 6 month follow up. Labs checked. No medication changes noted.  11/04/2021 - Fam Med (Dr Lorelei Pont) Seen for sebaceous cyst.  I and D of sebaceous cyst on neck completed. Prescribed 5 days of cephalexin 521m twice a day.   Recent consult visit: 02/11/2022 - INR was 2.7; Taking warfarin 2.5137mdaily except 37m61mues / Sat.  12/17/2021 -INR was 2.1; Continue current dose of 2.37mg42mily except 37mg 14ms / Sat.  10/22/2021 - INR was 2.3;  Continue current dose of 2.37mg d31my except 37mg Tu21m/ Sat. 08/27/2021 - [facility] 3.2. Take  2.37mg war737min today, then restart 2.37mg dail72mxcept 37mg on Tu52mays and Saturdays.  Hospital visits: None in the last 6 months  Objective:  Lab Results  Component Value Date   CREATININE 0.81 01/22/2022   CREATININE 0.87 07/24/2021   CREATININE 0.88 01/31/2021    Lab Results  Component Value Date   HGBA1C 5.2 03/02/2019   Last diabetic Eye exam: No results found for: "HMDIABEYEEXA"  Last diabetic Foot exam: No results found for: "HMDIABFOOTEX"      Component Value Date/Time   CHOL 176 07/26/2020 1129   TRIG 267.0 (H) 07/26/2020 1129   HDL 36.10 (L) 07/26/2020 1129   CHOLHDL 5 07/26/2020 1129   VLDL 53.4 (H) 07/26/2020 1129   LDLCALC 164 (H) 03/02/2019 1049   LDLDIRECT 96.0 07/26/2020 1129       Latest Ref Rng & Units 01/22/2022   10:59 AM 07/24/2021   11:04 AM 07/26/2020   11:29 AM  Hepatic Function  Total Protein 6.0 - 8.3 g/dL  7.7  7.6   Albumin 3.5 - 5.2 g/dL  4.4  4.3   AST 0 - 37 U/L  29  25   ALT 0 - 53 U/L  36  27   Alk Phosphatase 39 - 117 U/L  64  49   Total Bilirubin 0.2 - 1.2 mg/dL 3.0  2.8  2.3   Bilirubin, Direct 0.0 - 0.2 mg/dL 0.4       Lab Results  Component Value Date/Time   TSH 1.54 03/02/2019 10:49 AM   TSH 1.63 02/08/2014 10:12 AM  Latest Ref Rng & Units 01/22/2022   10:59 AM 07/24/2021   11:04 AM 01/31/2021   11:24 AM  CBC  WBC 4.0 - 10.5 K/uL 6.7  6.3  7.3   Hemoglobin 13.0 - 17.0 g/dL 15.2  15.7  15.7   Hematocrit 39.0 - 52.0 % 45.1  46.6  46.8   Platelets 150.0 - 400.0 K/uL 127.0  134.0  135.0     No results found for: "VD25OH"  Clinical ASCVD: No  The ASCVD Risk score (Arnett DK, et al., 2019) failed to calculate for the following reasons:   The 2019 ASCVD risk score is only valid for ages 58 to 24     Social History   Tobacco Use  Smoking Status Former   Packs/day: 2.00   Years: 40.00   Total pack years: 80.00   Types: Cigarettes   Quit date: 08/18/1994   Years since quitting: 27.6  Smokeless Tobacco Never   Tobacco Comments   smoked 1956-1996, up to 2 ppd   BP Readings from Last 3 Encounters:  01/22/22 118/64  11/04/21 110/60  10/16/21 112/72   Pulse Readings from Last 3 Encounters:  01/22/22 91  11/04/21 67  10/16/21 65   Wt Readings from Last 3 Encounters:  01/22/22 212 lb 6.4 oz (96.3 kg)  11/04/21 205 lb (93 kg)  10/16/21 211 lb (95.7 kg)    Assessment: Review of patient past medical history, allergies, medications, health status, including review of consultants reports, laboratory and other test data, was performed as part of comprehensive evaluation and provision of chronic care management services.   SDOH:  (Social Determinants of Health) assessments and interventions performed:  SDOH Interventions    Flowsheet Row Most Recent Value  SDOH Interventions   Food Insecurity Interventions Intervention Not Indicated  Housing Interventions Intervention Not Indicated  Physical Activity Interventions Intervention Not Indicated  Social Connections Interventions Intervention Not Indicated         CCM Care Plan  Allergies  Allergen Reactions   Amlodipine Besy-Benazepril Hcl     REACTION: swelling of feet.  Probably  from Amlodipine component)   Lipitor [Atorvastatin Calcium]     ? Reaction, ? Swelling=- update 7/20.  Pt recalls his legs hurt and perhaps swelled slightly - JC    Medications Reviewed Today     Reviewed by Cherre Robins, RPH-CPP (Pharmacist) on 04/07/22 at 1328  Med List Status: <None>   Medication Order Taking? Sig Documenting Provider Last Dose Status Informant  acetaminophen (TYLENOL) 500 MG tablet [id number] Yes Take 1,000 mg by mouth 2 (two) times daily as needed for moderate pain, fever or headache. [provider] Taking Active            Med Note Antony Contras, Jenene Slicker Oct 07, 2021  1:18 PM)    cholecalciferol (VITAMIN D) 1000 UNITS tablet [id number] Yes Take 1,000 Units by mouth daily. [provider] Taking Active    cyanocobalamin (VITAMIN B12) 1000 MCG tablet [id number] Yes Take 1,000 mcg by mouth daily. [provider] Taking Active   diazepam (VALIUM) 5 MG tablet [id number] Yes TAKE 1/2 TO 1 TABLET BY MOUTH AT BEDTIME AS NEEDED FOR SLEEP Copland, Gay Filler, MD Taking Active   famotidine (PEPCID) 10 MG tablet [id number] Yes Take 10 mg by mouth 2 (two) times daily as needed for heartburn or indigestion. [provider] Taking Active   Fluticasone Propionate (FLONASE NA) [id number] Yes Place into the nose as needed. [provider] Taking  Active            Med Note [name]   Fri Mar 30, 2020 11:13 AM) As needed  folic acid (FOLVITE) 646 MCG tablet [id number] Yes Take 400 mcg by mouth daily. [provider] Taking Active   Krill Oil 500 MG CAPS [id number] Yes Take 1,000 mg by mouth daily. [provider] Taking Active   losartan-hydrochlorothiazide (HYZAAR) 100-12.5 MG tablet [id number] Yes TAKE 1 TABLET BY MOUTH EVERY DAY Copland, Gay Filler, MD Taking Active   propranolol ER (INDERAL LA) 80 MG 24 hr capsule [id number] Yes TAKE 1 CAPSULE BY MOUTH EVERY DAY Copland, Gay Filler, MD Taking Active   simvastatin (ZOCOR) 10 MG tablet [id number] Yes TAKE 1 TABLET BY MOUTH EVERYDAY AT BEDTIME Copland, Gay Filler, MD Taking Active   warfarin (COUMADIN) 5 MG tablet [id number] Yes TAKE AS DIRECTED BY ANTICOAGULATION CLINIC Copland, Gay Filler, MD Taking Active             Patient Active Problem List   Diagnosis Date Noted   [name] diarrhea 12/28/2019   Hearing loss 01/26/2018   Long term (current) use of anticoagulants 05/15/2017   Encounter for therapeutic drug monitoring 12/05/2016   Insomnia 09/05/2015   Diarrhea 09/05/2015   Hx of adenomatous colonic polyps 12/14/2014   Hematuria, gross 02/03/2014   Thrombosis of mesenteric vein ([place]) 07/12/2013   Thrombocytopenia, unspecified ([place]) 01/05/2013   Gilbert syndrome 01/05/2013   Unspecified adverse effect of  unspecified drug, medicinal and biological substance 01/09/2012   Barrett's esophagus 03/24/2011   Factor 5 Leiden mutation, heterozygous ([place]) 03/21/2011   Essential tremor 04/24/2009   Hyperlipidemia 10/03/2008   Essential hypertension 10/03/2008   GERD 10/03/2008   Fasting hyperglycemia 10/03/2008   DVT, HX OF 10/03/2008    Immunization History  Administered Date(s) Administered   DT (Pediatric) 02/16/2015   Fluad Quad(high Dose 65+) 05/10/2019, 05/17/2020   Influenza Split 05/03/2012   Influenza Whole 07/24/2010   Influenza, High Dose Seasonal PF 05/20/2013, 05/09/2016, 05/15/2017, 04/20/2018   Influenza,inj,Quad PF,6+ Mos 05/03/2014, 05/16/2015   Influenza-Unspecified 06/24/2021   PFIZER(Purple Top)SARS-COV-2 Vaccination 08/27/2019, 09/17/2019, 07/31/2020   Pneumococcal Conjugate-13 09/05/2015   Pneumococcal Polysaccharide-23 09/26/2016   Td 03/03/2015   Tdap 03/08/2015   Zoster Recombinat (Shingrix) 02/22/2018, 10/05/2018    Conditions to be addressed/monitored: HTN, HLD, and tremor; chronic constipation; GERD with Barrett's esophagus; chronic anticoagulation due to Factor V Leiden Deficiency  Care Plan : General Pharmacy (Adult)  Updates made by Cherre Robins, RPH-CPP since 04/07/2022 12:00 AM     Problem: Hypertension, Hyperlipidemia, Factor 5 Leiden, Hx of Barrett's Esophagus/Hx of C.diff/GERD,   Priority: High  Onset Date: 10/02/2020     Long-Range Goal: Provide education, support and care coordination for medication therapy and chronic conditions Completed 04/07/2022  Start Date: 10/02/2020  Expected End Date: 04/01/2021  This Visit's Progress: On track  Recent Progress: On track  Priority: High  Note:   Current Barriers:  Unable to achieve control of lipids  Several medications on med list that he has stopped (mostly as needed medications)  Pharmacist Clinical Goal(s):  Over the next 180 days, patient will achieve adherence to monitoring guidelines and  medication adherence to achieve therapeutic efficacy achieve control of lipids  as evidenced by updated lipid panel through collaboration with PharmD and provider.  Coordinate with gastroenterology office to lower dose of Linzess or find alternative.   Interventions: 1:1 collaboration with Copland, Gay Filler, MD regarding development and update of comprehensive plan  of care as evidenced by provider attestation and co-signature Inter-disciplinary care team collaboration (see longitudinal plan of care) Comprehensive medication review performed; medication list updated in electronic medical record  Hypertension (BP goal <140/90) Controlled Current treatment: Losartan/hydrochlorothiazide  100/12.41m daily AM Propranolol LA 841mdaily (for tremors) Medications previously tried: amlodipine-benazepril (swelling in feet)  Current home readings: 116 to 130 / 70 to 81 Current exercise habits: walking daily for about 20 minutes Denies hypotensive/hypertensive symptoms. He did have a fall 01/18/2021 but reports it was due to tripping on door frame and not due to dizziness. Interventions:  Reviewed blood pressure  goals and benefits of medications for prevention of heart attack, stroke and kidney damage; Continue to monitor blood pressure at home weekly, document, and provide log at future appointments Recommended to continue current medication Continue to exercise with goal of 150 minutes per week as able.  Hyperlipidemia: (LDL goal < 100) Controlled; Last LDL was 96 (07/26/2020) Tg were slightly elevated Current treatment: Simvastatin 109mt bedtime Krill Oil 500m17mily Medications previously tried: atorvastatin (swelling)  Discontinued alcohol use 2 years ago Current exercise habits: see above Interventions:  Reviewed cholesterol goals Benefits of statin for ASCVD risk reduction; Recommended to continue current medication Consider rechecking lipids with next labs  Factor V Leiden  Mutation (Goal: maintain INR 2.0 to 3.0) Managed by anticoag clinic [place] INR was slightly elevated at 3.2 Checks usually every 6 to 8 weeks Current treatment  Warfarin 5mg 63mly as directed (5mg o42muesdays and Saturdays and  2.5mg al69mther days) Medications previously tried:none noted Interventions:   Recommended to continue current anticoagulation medication.  Continue to follow up with anticoagulation clinic  Chronic Constipation: Improved per patient. He has stopped Linzess. States that is helped "too much" caused diarrhea Current therapy:  OTC stool softener as needed Managed by Dr KennedyBerniece Paper medications tried: Linzess 145mcg a44m2mcg - 48mking too well" / had loose stools.  Interventions:  Patient doing well - continue over-the-counter stool softener as needed   Patient Goals/Self-Care Activities Over the next 180 days, patient will:  take medications as prescribed check blood pressure weekly, document, and provide at future appointments target a minimum of 150 minutes of moderate intensity exercise weekly  Follow Up Plan: patient ha met all medication related goal. No follow up needed at this time.          Medication Assistance: None needed, patient endorses he is able to afford current medication regimen.   Patient's preferred pharmacy is:  [id number] [id number] [phone] [id number] [id number]:  Patient agrees to Care Plan and Follow-up.  Plan: No further follow up required: patient has med medication related goals.   [place] EckaCherre Robinslinical Pharmacist [place] [facility]

## 2022-04-07 NOTE — Patient Instructions (Signed)
Mr. Pressly,  It was a pleasure speaking with you today.  Below is a summary of your health goals and summary of our recent visit. You can also view your updated Chronic Care Management Care plan through your MyChart account.   Patient Goals/Self-Care Activities take medications as prescribed check blood pressure weekly, document, and provide at future appointments target a minimum of 150 minutes of moderate intensity exercise weekly   As always if you have any questions or concerns especially regarding medications, please feel free to contact me either at the phone number below or with a MyChart message.   Keep up the good work!  Cherre Robins, PharmD Clinical Pharmacist [place] [phone] (direct line)  [phone] (main office number)   Patient verbalizes understanding of instructions and care plan provided today and agrees to view in [place]. Active MyChart status and patient understanding of how to access instructions and care plan via MyChart confirmed with patient.

## 2022-04-08 ENCOUNTER — Ambulatory Visit (INDEPENDENT_AMBULATORY_CARE_PROVIDER_SITE_OTHER): Payer: Medicare Other

## 2022-04-08 DIAGNOSIS — Z7901 Long term (current) use of anticoagulants: Secondary | ICD-10-CM | POA: Diagnosis not present

## 2022-04-08 LAB — POCT INR: INR: 2.3 (ref 2.0–3.0)

## 2022-04-08 NOTE — Patient Instructions (Addendum)
Pre visit review using our clinic review tool, if applicable. No additional management support is needed unless otherwise documented below in the visit note.  Continue to take 1/2 tablet all days except 1 tablet on Tuesday and Saturdays.  Recheck in 8 weeks 

## 2022-04-08 NOTE — Progress Notes (Signed)
Continue to take 1/2 tablet all days except 1 tablet on Tuesday and Saturdays.  Recheck in 8 weeks per pt request. 

## 2022-04-16 DIAGNOSIS — H905 Unspecified sensorineural hearing loss: Secondary | ICD-10-CM | POA: Diagnosis not present

## 2022-04-17 DIAGNOSIS — I10 Essential (primary) hypertension: Secondary | ICD-10-CM

## 2022-04-17 DIAGNOSIS — E785 Hyperlipidemia, unspecified: Secondary | ICD-10-CM | POA: Diagnosis not present

## 2022-05-04 ENCOUNTER — Other Ambulatory Visit: Payer: Self-pay | Admitting: Family Medicine

## 2022-05-04 DIAGNOSIS — M62838 Other muscle spasm: Secondary | ICD-10-CM

## 2022-05-05 NOTE — Telephone Encounter (Signed)
Requesting: diazepam '5mg'$  Contract: None UDS: None Last Visit: 01/22/22 Next Visit:  07/28/22 Last Refill: 09/02/2021  # 30 x 2RF  Please Advise

## 2022-05-15 ENCOUNTER — Ambulatory Visit (INDEPENDENT_AMBULATORY_CARE_PROVIDER_SITE_OTHER): Payer: Medicare Other | Admitting: *Deleted

## 2022-05-15 DIAGNOSIS — Z0000 Encounter for general adult medical examination without abnormal findings: Secondary | ICD-10-CM | POA: Diagnosis not present

## 2022-05-15 NOTE — Patient Instructions (Signed)
Patrick Hatfield , Thank you for taking time to come for your Medicare Wellness Visit. I appreciate your ongoing commitment to your health goals. Please review the following plan we discussed and let me know if I can assist you in the future.   These are the goals we discussed:  Goals      Maintain healthy active lifestyle.      Manage My Medicine     Timeframe:  Long-Range Goal Priority:  Medium Start Date:       10/02/20                      Expected End Date:      04/01/21                 Follow Up Date 01/15/21   - call for medicine refill 2 or 3 days before it runs out - keep a list of all the medicines I take; vitamins and herbals too - use a pillbox to sort medicine    Why is this important?   These steps will help you keep on track with your medicines.   Notes: Keep up the good work maintaining your health!        This is a list of the screening recommended for you and due dates:  Health Maintenance  Topic Date Due   COVID-19 Vaccine (4 - Pfizer risk series) 09/25/2020   Flu Shot  03/18/2022   Tetanus Vaccine  03/07/2025   Pneumonia Vaccine  Completed   Zoster (Shingles) Vaccine  Completed   HPV Vaccine  Aged Out     Next appointment: Follow up in one year for your annual wellness visit.   Preventive Care 69 Years and Older, Male Preventive care refers to lifestyle choices and visits with your health care provider that can promote health and wellness. What does preventive care include? A yearly physical exam. This is also called an annual well check. Dental exams once or twice a year. Routine eye exams. Ask your health care provider how often you should have your eyes checked. Personal lifestyle choices, including: Daily care of your teeth and gums. Regular physical activity. Eating a healthy diet. Avoiding tobacco and drug use. Limiting alcohol use. Practicing safe sex. Taking low doses of aspirin every day. Taking vitamin and mineral supplements as  recommended by your health care provider. What happens during an annual well check? The services and screenings done by your health care provider during your annual well check will depend on your age, overall health, lifestyle risk factors, and family history of disease. Counseling  Your health care provider may ask you questions about your: Alcohol use. Tobacco use. Drug use. Emotional well-being. Home and relationship well-being. Sexual activity. Eating habits. History of falls. Memory and ability to understand (cognition). Work and work Statistician. Screening  You may have the following tests or measurements: Height, weight, and BMI. Blood pressure. Lipid and cholesterol levels. These may be checked every 5 years, or more frequently if you are over 79 years old. Skin check. Lung cancer screening. You may have this screening every year starting at age 63 if you have a 30-pack-year history of smoking and currently smoke or have quit within the past 15 years. Fecal occult blood test (FOBT) of the stool. You may have this test every year starting at age 22. Flexible sigmoidoscopy or colonoscopy. You may have a sigmoidoscopy every 5 years or a colonoscopy every 10 years starting at age 92.  Prostate cancer screening. Recommendations will vary depending on your family history and other risks. Hepatitis C blood test. Hepatitis B blood test. Sexually transmitted disease (STD) testing. Diabetes screening. This is done by checking your blood sugar (glucose) after you have not eaten for a while (fasting). You may have this done every 1-3 years. Abdominal aortic aneurysm (AAA) screening. You may need this if you are a current or former smoker. Osteoporosis. You may be screened starting at age 89 if you are at high risk. Talk with your health care provider about your test results, treatment options, and if necessary, the need for more tests. Vaccines  Your health care provider may recommend  certain vaccines, such as: Influenza vaccine. This is recommended every year. Tetanus, diphtheria, and acellular pertussis (Tdap, Td) vaccine. You may need a Td booster every 10 years. Zoster vaccine. You may need this after age 35. Pneumococcal 13-valent conjugate (PCV13) vaccine. One dose is recommended after age 53. Pneumococcal polysaccharide (PPSV23) vaccine. One dose is recommended after age 46. Talk to your health care provider about which screenings and vaccines you need and how often you need them. This information is not intended to replace advice given to you by your health care provider. Make sure you discuss any questions you have with your health care provider. Document Released: 08/31/2015 Document Revised: 04/23/2016 Document Reviewed: 06/05/2015 Elsevier Interactive Patient Education  2017 [place] Prevention in the Home Falls can cause injuries. They can happen to people of all ages. There are many things you can do to make your home safe and to help prevent falls. What can I do on the outside of my home? Regularly fix the edges of walkways and driveways and fix any cracks. Remove anything that might make you trip as you walk through a door, such as a raised step or threshold. Trim any bushes or trees on the path to your home. Use bright outdoor lighting. Clear any walking paths of anything that might make someone trip, such as rocks or tools. Regularly check to see if handrails are loose or broken. Make sure that both sides of any steps have handrails. Any raised decks and porches should have guardrails on the edges. Have any leaves, snow, or ice cleared regularly. Use sand or salt on walking paths during winter. Clean up any spills in your garage right away. This includes oil or grease spills. What can I do in the bathroom? Use night lights. Install grab bars by the toilet and in the tub and shower. Do not use towel bars as grab bars. Use non-skid mats or  decals in the tub or shower. If you need to sit down in the shower, use a plastic, non-slip stool. Keep the floor dry. Clean up any water that spills on the floor as soon as it happens. Remove soap buildup in the tub or shower regularly. Attach bath mats securely with double-sided non-slip rug tape. Do not have throw rugs and other things on the floor that can make you trip. What can I do in the bedroom? Use night lights. Make sure that you have a light by your bed that is easy to reach. Do not use any sheets or blankets that are too big for your bed. They should not hang down onto the floor. Have a firm chair that has side arms. You can use this for support while you get dressed. Do not have throw rugs and other things on the floor that can make you trip.  What can I do in the kitchen? Clean up any spills right away. Avoid walking on wet floors. Keep items that you use a lot in easy-to-reach places. If you need to reach something above you, use a strong step stool that has a grab bar. Keep electrical cords out of the way. Do not use floor polish or wax that makes floors slippery. If you must use wax, use non-skid floor wax. Do not have throw rugs and other things on the floor that can make you trip. What can I do with my stairs? Do not leave any items on the stairs. Make sure that there are handrails on both sides of the stairs and use them. Fix handrails that are broken or loose. Make sure that handrails are as long as the stairways. Check any carpeting to make sure that it is firmly attached to the stairs. Fix any carpet that is loose or worn. Avoid having throw rugs at the top or bottom of the stairs. If you do have throw rugs, attach them to the floor with carpet tape. Make sure that you have a light switch at the top of the stairs and the bottom of the stairs. If you do not have them, ask someone to add them for you. What else can I do to help prevent falls? Wear shoes that: Do not  have high heels. Have rubber bottoms. Are comfortable and fit you well. Are closed at the toe. Do not wear sandals. If you use a stepladder: Make sure that it is fully opened. Do not climb a closed stepladder. Make sure that both sides of the stepladder are locked into place. Ask someone to hold it for you, if possible. Clearly mark and make sure that you can see: Any grab bars or handrails. First and last steps. Where the edge of each step is. Use tools that help you move around (mobility aids) if they are needed. These include: Canes. Walkers. Scooters. Crutches. Turn on the lights when you go into a dark area. Replace any light bulbs as soon as they burn out. Set up your furniture so you have a clear path. Avoid moving your furniture around. If any of your floors are uneven, fix them. If there are any pets around you, be aware of where they are. Review your medicines with your doctor. Some medicines can make you feel dizzy. This can increase your chance of falling. Ask your doctor what other things that you can do to help prevent falls. This information is not intended to replace advice given to you by your health care provider. Make sure you discuss any questions you have with your health care provider. Document Released: 05/31/2009 Document Revised: 01/10/2016 Document Reviewed: 09/08/2014 Elsevier Interactive Patient Education  2017 [employer].

## 2022-05-15 NOTE — Progress Notes (Signed)
Subjective:   Patrick Hatfield is a 87 y.o. male who presents for Medicare Annual/Subsequent preventive examination.  I connected with  Patrick Hatfield on 05/15/22 by a audio enabled telemedicine application and verified that I am speaking with the correct person using two identifiers.  Patient Location: Home  Provider Location: Office/Clinic  I discussed the limitations of evaluation and management by telemedicine. The patient expressed understanding and agreed to proceed.   Review of Systems    Defer to PCP Cardiac Risk Factors include: advanced age (>15mn, >>57women);male gender;dyslipidemia;hypertension     Objective:    There were no vitals filed for this visit. There is no height or weight on file to calculate BMI.     05/15/2022    3:02 PM 05/15/2021    2:28 PM 01/18/2021    9:01 AM 01/18/2021    9:00 AM 04/27/2020    9:54 AM 04/27/2020    9:18 AM 11/22/2019    9:09 AM  Advanced Directives  Does Patient Have a Medical Advance Directive? Yes Yes Yes No Yes No Yes  Type of [place]amed[place]of [place] will [place] Center[place] will Living will  [place] will    Does patient want to make changes to medical advance directive? No - Patient declined  No - Patient declined    No - Patient declined  Copy of [place] Chart? No - copy requested No - copy requested       Would patient like information on creating a medical advance directive?   No - Patient declined        Current Medications (verified) Outpatient Encounter Medications as of 05/15/2022  Medication Sig   acetaminophen (TYLENOL) 500 MG tablet Take 1,000 mg by mouth 2 (two) times daily as needed for moderate pain, fever or headache.   cholecalciferol (VITAMIN D) 1000 UNITS tablet Take 1,000 Units by mouth daily.   diazepam (VALIUM) 5 MG tablet TAKE 1/2 TO 1 TABLET BY MOUTH AT BEDTIME AS NEEDED FOR SLEEP   famotidine (PEPCID) 10 MG  tablet Take 10 mg by mouth 2 (two) times daily as needed for heartburn or indigestion.   Fluticasone Propionate (FLONASE NA) Place into the nose as needed.   folic acid (FOLVITE) 4973MCG tablet Take 400 mcg by mouth daily.   Krill Oil 500 MG CAPS Take 1,000 mg by mouth daily.   losartan-hydrochlorothiazide (HYZAAR) 100-12.5 MG tablet TAKE 1 TABLET BY MOUTH EVERY DAY   propranolol ER (INDERAL LA) 80 MG 24 hr capsule TAKE 1 CAPSULE BY MOUTH EVERY DAY   simvastatin (ZOCOR) 10 MG tablet TAKE 1 TABLET BY MOUTH EVERYDAY AT BEDTIME   warfarin (COUMADIN) 5 MG tablet TAKE AS DIRECTED BY ANTICOAGULATION CLINIC   [DISCONTINUED] cyanocobalamin (VITAMIN B12) 1000 MCG tablet Take 1,000 mcg by mouth daily.   No facility-administered encounter medications on file as of 05/15/2022.    Allergies (verified) Amlodipine besy-benazepril hcl and Lipitor [atorvastatin calcium]   History: Past Medical History:  Diagnosis Date   Anxiety    Arthritis    KNEES,ANKLES,HANDS   Asthma    AS A TEENAGER   Barrett's esophagus    Benign essential tremor    Clotting disorder (HCC)    FACTOR 5   Colitis in 20's   Colon polyps    Congenital deficiency of other clotting factors    factor 5   Depression 2006   drug overdose   Diverticulosis  DVT (deep venous thrombosis) ([place]) 1991 & 2010   X 2 in context Factor 5 def   Esophageal reflux    Factor 5 Leiden mutation, heterozygous ([place])    Gilbert's syndrome    H/O ischemic bowel disease 2010   while off warfarin    Herpes zoster 2010   facial   Hiatal hernia    Hx of adenomatous colonic polyps 12/14/2014   Hyperlipidemia    Hypertension    Insomnia    Osteoporosis    Personal history of colonic polyps 05/28/2007   adenomatous   Proteinuria age 49   Thrombosis of mesenteric vein ([place]) 34/74/2595   01/3874   Umbilical hernia    Past Surgical History:  Procedure Laterality Date   CHOLECYSTECTOMY  05/16/2011    Dr Patrick Hatfield   COLONOSCOPY     Dr Patrick Hatfield    CYSTOSCOPY     Neg   EYE SURGERY Bilateral 10/16/2017   secondary catarct surgery.   KNEE ARTHROSCOPY Bilateral 2008, 2012   Dr.[place] bilateral   TONSILLECTOMY     TRANSURETHRAL RESECTION OF PROSTATE      BPH;Dr Patrick Hatfield   Family History  Problem Relation Age of Onset   Pancreatic cancer Father 70   Ulcers Father    Stroke Father 71   Diabetes Father    Coronary artery disease Mother    Osteoporosis Mother    HIV Brother    Multiple sclerosis Sister    Heart attack Paternal Grandfather        early 47s   Heart attack Paternal Uncle        2 uncles in early 1s   Aortic aneurysm Brother    Colon cancer Neg Hx    Esophageal cancer Neg Hx    Stomach cancer Neg Hx    Social History   Socioeconomic History   Marital status: Married    Spouse name: Not on file   Number of children: 4   Years of education: Not on file   Highest education level: Not on file  Occupational History   Occupation: Retired    Fish farm manager: RETIRED  Tobacco Use   Smoking status: Former    Packs/day: 2.00    Years: 40.00    Total pack years: 80.00    Types: Cigarettes    Quit date: 08/18/1994    Years since quitting: 27.7   Smokeless tobacco: Never   Tobacco comments:    smoked 1956-1996, up to 2 ppd  Vaping Use   Vaping Use: Never used  Substance and Sexual Activity   Alcohol use: Yes    Alcohol/week: 3.0 standard drinks of alcohol    Types: 3 Cans of beer per week   Drug use: No   Sexual activity: Not Currently    Birth control/protection: None    Comment: Married  Other Topics Concern   Not on file  Social History Narrative   Married, lives with spouse   Married x56 years   4 daughters   4 grandchildren   Retired - Armed forces operational officer, sales/technology   Social Determinants of [place] Strain: [place]  (10/07/2021)   Overall Financial Resource Strain ([place])    Difficulty of Paying Living Expenses: Not hard at all  Food Insecurity: No Food Insecurity  (04/07/2022)   Hunger Vital Sign    Worried About Running Out of Food in the Last Year: Never true    [place] in the Last Year: Never true  Transportation Needs: No Transportation Needs (10/07/2021)   PRAPARE - Hydrologist (Medical): No    Lack of Transportation (Non-Medical): No  Physical Activity: Insufficiently Active (04/07/2022)   Exercise Vital Sign    Days of Exercise per Week: 7 days    Minutes of Exercise per Session: 20 min  Stress: Not on file  Social Connections: Socially Integrated (04/07/2022)   Social Connection and Isolation Panel [NHANES]    Frequency of Communication with Friends and Family: More than three times a week    Frequency of Social Gatherings with Friends and Family: Twice a week    Attends Religious Services: More than 4 times per year    Active Member of [employer] or Organizations: Yes    Attends Archivist Meetings: 1 to 4 times per year    Marital Status: Married    Tobacco Counseling Counseling given: Not Answered Tobacco comments: smoked 1956-1996, up to 2 ppd   Clinical Intake:  Pre-visit preparation completed: Yes  Pain : No/denies pain     Diabetes: No  How often do you need to have someone help you when you read instructions, pamphlets, or other written materials from your doctor or pharmacy?: 1 - Never  Diabetic? No  Activities of Daily Living    05/15/2022    3:04 PM  In your present state of health, do you have any difficulty performing the following activities:  Hearing? 1  Comment wears hearing aids  Vision? 0  Difficulty concentrating or making decisions? 0  Walking or climbing stairs? 0  Dressing or bathing? 0  Doing errands, shopping? 0  Preparing Food and eating ? N  Using the Toilet? N  In the past six months, have you accidently leaked urine? Y  Comment occasionally  Do you have problems with loss of bowel control? N  Managing your Medications? N  Managing your Finances?  N  Housekeeping or managing your Housekeeping? N    Patient Care Team: Copland, Gay Filler, MD as PCP - General (Family Medicine) Latanya Maudlin, MD as Consulting Physician (Orthopedic Surgery) Gatha Mayer, MD as Consulting Physician (Gastroenterology) Marshell Garfinkel, MD as Consulting Physician (Pulmonary Disease)  Indicate any recent Medical Services you may have received from other than [facility] providers in the past year (date may be approximate).     Assessment:   This is a routine wellness examination for Patrick Hatfield.  Hearing/Vision screen No results found.  Dietary issues and exercise activities discussed: Current Exercise Habits: Home exercise routine, Type of exercise: walking, Time (Minutes): 30, Frequency (Times/Week): 7, Weekly Exercise (Minutes/Week): 210, Intensity: Mild, Exercise limited by: None identified   Goals Addressed   None    Depression Screen    05/15/2022    3:03 PM 01/22/2022   10:36 AM 07/24/2021   10:45 AM 05/15/2021    2:33 PM 01/31/2021    1:31 PM 07/26/2020   11:06 AM 12/14/2018   10:19 AM  PHQ 2/9 Scores  PHQ - 2 Score 0 0 0 0 0 0 0    Fall Risk    05/15/2022    3:03 PM 01/22/2022   10:36 AM 07/24/2021   10:45 AM 05/15/2021    2:31 PM 04/04/2021    1:32 PM  [place] in the past year? 0 0 0 1 1  Number falls in past yr: 0 0 0 0 0  Injury with Fall? 0 0 0 1 1  Risk for fall due  to : No Fall Risks   History of fall(s) History of fall(s)  Follow up Falls evaluation completed   Falls prevention discussed Falls evaluation completed;Falls prevention discussed    FALL RISK PREVENTION PERTAINING TO THE HOME:  Any stairs in or around the home? No  If so, are there any without handrails?  No stairs Home free of loose throw rugs in walkways, pet beds, electrical cords, etc? Yes  Adequate lighting in your home to reduce risk of falls? Yes   ASSISTIVE DEVICES UTILIZED TO PREVENT FALLS:  Life alert? No  Use of a cane, walker or w/c? No  Grab  bars in the bathroom? Yes  Shower chair or bench in shower? Yes  Elevated toilet seat or a handicapped toilet? No   TIMED UP AND GO:  Was the test performed? No . Audio visit   Cognitive Function:    09/26/2016   11:05 AM  MMSE - Mini Mental State Exam  Orientation to time 4  Orientation to Place 5  Registration 3  Attention/ Calculation 5  Recall 3  Language- name 2 objects 2  Language- repeat 1  Language- follow 3 step command 3  Language- read & follow direction 1  Write a sentence 1  Copy design 1  Total score 29        05/15/2022    3:17 PM  6CIT Screen  What Year? 0 points  What month? 0 points  What time? 0 points  Count back from 20 0 points  Months in reverse 0 points  Repeat phrase 0 points  Total Score 0 points    Immunizations Immunization History  Administered Date(s) Administered   DT (Pediatric) 02/16/2015   Fluad Quad(high Dose 65+) 05/10/2019, 05/17/2020   Influenza Split 05/03/2012   Influenza Whole 07/24/2010   Influenza, High Dose Seasonal PF 05/20/2013, 05/09/2016, 05/15/2017, 04/20/2018   Influenza,inj,Quad PF,6+ Mos 05/03/2014, 05/16/2015   Influenza-Unspecified 06/24/2021   PFIZER(Purple Top)SARS-COV-2 Vaccination 08/27/2019, 09/17/2019, 07/31/2020   Pneumococcal Conjugate-13 09/05/2015   Pneumococcal Polysaccharide-23 09/26/2016   Td 03/03/2015   Tdap 03/08/2015   Zoster Recombinat (Shingrix) 02/22/2018, 10/05/2018    TDAP status: Up to date  Flu Vaccine status: Due, Education has been provided regarding the importance of this vaccine. Advised may receive this vaccine at local pharmacy or Health Dept. Aware to provide a copy of the vaccination record if obtained from local pharmacy or Health Dept. Verbalized acceptance and understanding.  Pneumococcal vaccine status: Up to date  Covid-19 vaccine status: Information provided on how to obtain vaccines.   Qualifies for Shingles Vaccine? Yes   Zostavax completed No   Shingrix  Completed?: Yes  Screening Tests Health Maintenance  Topic Date Due   COVID-19 Vaccine (4 - Pfizer risk series) 09/25/2020   INFLUENZA VACCINE  03/18/2022   TETANUS/TDAP  03/07/2025   Pneumonia Vaccine 6+ Years old  Completed   Zoster Vaccines- Shingrix  Completed   HPV VACCINES  Aged Out    Health Maintenance  Health Maintenance Due  Topic Date Due   COVID-19 Vaccine (4 - Pfizer risk series) 09/25/2020   INFLUENZA VACCINE  03/18/2022    Colorectal cancer screening: No longer required.   Lung Cancer Screening: (Low Dose CT Chest recommended if Age 48-80 years, 30 pack-year currently smoking OR have quit w/in 15years.) does not qualify.   Lung Cancer Screening Referral: N/a  Additional Screening:  Hepatitis C Screening: does not qualify; Completed N/a  Vision Screening: Recommended annual ophthalmology exams for early  detection of glaucoma and other disorders of the eye. Is the patient up to date with their annual eye exam?  Yes  Who is the provider or what is the name of the office in which the patient attends annual eye exams? Used [facility] this year because he couldn't get in with his regular eye doctor If pt is not established with a provider, would they like to be referred to a provider to establish care? No .   Dental Screening: Recommended annual dental exams for proper oral hygiene  Community Resource Referral / Chronic Care Management: CRR required this visit?  No   CCM required this visit?  No      Plan:     I have personally reviewed and noted the following in the patient's chart:   Medical and social history Use of alcohol, tobacco or illicit drugs  Current medications and supplements including opioid prescriptions. Patient is not currently taking opioid prescriptions. Functional ability and status Nutritional status Physical activity Advanced directives List of other physicians Hospitalizations, surgeries, and ER visits in previous 12  months Vitals Screenings to include cognitive, depression, and falls Referrals and appointments  In addition, I have reviewed and discussed with patient certain preventive protocols, quality metrics, and best practice recommendations. A written personalized care plan for preventive services as well as general preventive health recommendations were provided to patient.   Due to this being a telephonic visit, the after visit summary with patients personalized plan was offered to patient via mail or my-chart. Patient would like to access on my-chart.  Beatris Ship, [place]   05/15/2022   Nurse Notes: None

## 2022-05-19 ENCOUNTER — Ambulatory Visit: Payer: Medicare Other

## 2022-06-03 ENCOUNTER — Ambulatory Visit (INDEPENDENT_AMBULATORY_CARE_PROVIDER_SITE_OTHER): Payer: Medicare Other

## 2022-06-03 DIAGNOSIS — Z86718 Personal history of other venous thrombosis and embolism: Secondary | ICD-10-CM | POA: Diagnosis not present

## 2022-06-03 DIAGNOSIS — Z7901 Long term (current) use of anticoagulants: Secondary | ICD-10-CM | POA: Diagnosis not present

## 2022-06-03 DIAGNOSIS — Z23 Encounter for immunization: Secondary | ICD-10-CM

## 2022-06-03 LAB — POCT INR: INR: 2.5 (ref 2.0–3.0)

## 2022-06-03 NOTE — Patient Instructions (Signed)
Continue to take 1/2 tablet all days except 1 tablet on Tuesday and Saturdays.  Recheck in 8 weeks.

## 2022-06-03 NOTE — Progress Notes (Signed)
Continue to take 1/2 tablet all days except 1 tablet on Tuesday and Saturdays.  Recheck in 8 weeks per pt request. 

## 2022-06-16 ENCOUNTER — Other Ambulatory Visit: Payer: Self-pay | Admitting: Family Medicine

## 2022-06-16 DIAGNOSIS — G250 Essential tremor: Secondary | ICD-10-CM

## 2022-06-22 ENCOUNTER — Encounter: Payer: Self-pay | Admitting: Family Medicine

## 2022-07-11 ENCOUNTER — Other Ambulatory Visit: Payer: Self-pay | Admitting: Family Medicine

## 2022-07-11 DIAGNOSIS — E785 Hyperlipidemia, unspecified: Secondary | ICD-10-CM

## 2022-07-13 ENCOUNTER — Encounter: Payer: Self-pay | Admitting: Family Medicine

## 2022-07-14 MED ORDER — IPRATROPIUM BROMIDE 0.03 % NA SOLN: 2.0000 | Freq: Three times a day (TID) | NASAL | 6 refills | Status: DC

## 2022-07-24 ENCOUNTER — Encounter: Payer: Self-pay | Admitting: Family Medicine

## 2022-07-24 NOTE — Patient Instructions (Addendum)
It was good to see you again today, happy holidays!  Please see me about 6 months assuming all is well Recommend the latest COVID booster if not done already, consider dose of RSV  For reflux- let's do Prilosec/ omeprazole daily for 30 days, can continue longer if needed Carafate for 10 days I will be in touch with your H pylori report- if you are positive we will treat this ulcer causing bacteria!   Happiest of holidays to you and your family

## 2022-07-24 NOTE — Progress Notes (Addendum)
[place] at [facility] [address], [place], [place] [address] [phone]  Date:  07/28/2022   Name:  Patrick Hatfield   DOB:  1934-09-17   MRN:  [id number]  PCP:  Darreld Mclean, MD    Chief Complaint: 6 month follow up (Concerns/ questions: 1. Acid reflux daily x 1 month taking Famotidine. 2. Weakness in bilateral LE especially in the AM. /)   History of Present Illness:  Patrick Hatfield is a 87 y.o. very pleasant male patient who presents with the following:  Patient seen today for 80-monthfollow-up Most recent visit with myself was in June of this year history of factor V Leiden, DVT, mesenteric vein thrombosis currently on Coumadin, essential tremor, hypertension, hyperlipidemia He goes to anticoagulation clinic- no abnormal bleeding or bruising He has 4 daughters, 4 grandchildren- the youngest are in college right now  His wife MJoycelyn Schmid He follows up with urology for PSA monitoring  He has noted reflux symptoms over the years.  However over the last month if not longer he has had more persistent/ daily symptoms He is taking famotidine daily recently.  He is omeprazole previously but switched to H2 blocker due to concern about long-term side effects It may wake him up at night  No particular foods seem to cause symptoms No CP or SOB, no sx with exertion-exercise tolerance is unchanged  BP is on the low side- he denies any lightheadedness or dizziness His legs may feel weak when he first gets up in the am but his goes away after about 20 minutes when he gets up and moving.  No pain or numbness  No claudication sx  He generally walks around the block each am Their dog did pass away; this was his walking partner.  At their age they do not really plan to get another dog  Diazepam as needed sleep Losartan HCTZ Propranolol ER 80 mg daily Simvastatin 10 Coumadin Patient Active Problem List   Diagnosis Date Noted    [name] diarrhea 12/28/2019   Hearing loss 01/26/2018   Long term (current) use of anticoagulants 05/15/2017   Encounter for therapeutic drug monitoring 12/05/2016   Insomnia 09/05/2015   Diarrhea 09/05/2015   Hx of adenomatous colonic polyps 12/14/2014   Hematuria, gross 02/03/2014   Thrombosis of [place]enteric vein ([place] 07/12/2013   Thrombocytopenia, unspecified ([facility] 01/05/2013   [place] Randysyndrome 01/05/2013   Unspecified adverse effect of unspecified drug, medicinal and biological substance 01/09/2012   Barrett's esophagus 03/24/2011   Factor 5 Leiden mutation, heterozygous ([place] 03/21/2011   Essential tremor 04/24/2009   Hyperlipidemia 10/03/2008   Essential hypertension 10/03/2008   GERD 10/03/2008   Fasting hyperglycemia 10/03/2008   DVT, HX OF 10/03/2008    Past Medical History:  Diagnosis Date   Anxiety    Arthritis    KNEES,ANKLES,HANDS   Asthma    AS A TEENAGER   Barrett's esophagus    Benign essential tremor    Clotting disorder ([place]    FACTOR 5   Colitis in 20's   Colon polyps    Congenital deficiency of other clotting factors    factor 5   Depression 2006   drug overdose   Diverticulosis    DVT (deep venous thrombosis) ([place] 1991 & 2010   X 2 in context Factor 5 def   Esophageal reflux    Factor 5 Leiden mutation, heterozygous ([place]    Gilbert's syndrome  H/O ischemic bowel disease 2010   while off warfarin    Herpes zoster 2010   facial   Hiatal hernia    Hx of adenomatous colonic polyps 12/14/2014   Hyperlipidemia    Hypertension    Insomnia    Osteoporosis    Personal history of colonic polyps 05/28/2007   adenomatous   Proteinuria age 89   Thrombosis of mesenteric vein ([place]) 09/98/3382   12/537   Umbilical hernia     Past Surgical History:  Procedure Laterality Date   CHOLECYSTECTOMY  05/16/2011    Dr Marlou Starks   COLONOSCOPY     Dr Sharlett Iles   CYSTOSCOPY     Neg   EYE SURGERY Bilateral 10/16/2017   secondary catarct surgery.    KNEE ARTHROSCOPY Bilateral 2008, 2012   Dr.Aplington bilateral   TONSILLECTOMY     TRANSURETHRAL RESECTION OF PROSTATE      BPH;Dr Dahlstedt    Social History   Tobacco Use   Smoking status: Former    Packs/day: 2.00    Years: 40.00    Total pack years: 80.00    Types: Cigarettes    Quit date: 08/18/1994    Years since quitting: 27.9   Smokeless tobacco: Never   Tobacco comments:    smoked 1956-1996, up to 2 ppd  Vaping Use   Vaping Use: Never used  Substance Use Topics   Alcohol use: Yes    Alcohol/week: 3.0 standard drinks of alcohol    Types: 3 Cans of beer per week   Drug use: No    Family History  Problem Relation Age of Onset   Pancreatic cancer Father 50   Ulcers Father    Stroke Father 67   Diabetes Father    Coronary artery disease Mother    Osteoporosis Mother    HIV Brother    Multiple sclerosis Sister    Heart attack Paternal Grandfather        early 32s   Heart attack Paternal Uncle        2 uncles in early 79s   Aortic aneurysm Brother    Colon cancer Neg Hx    Esophageal cancer Neg Hx    Stomach cancer Neg Hx     Allergies  Allergen Reactions   Amlodipine Besy-Benazepril Hcl     REACTION: swelling of feet.  Probably  from Amlodipine component)   Lipitor [Atorvastatin Calcium]     ? Reaction, ? Swelling=- update 7/20.  Pt recalls his legs hurt and perhaps swelled slightly - JC    Medication list has been reviewed and updated.  Current Outpatient Medications on File Prior to Visit  Medication Sig Dispense Refill   acetaminophen (TYLENOL) 500 MG tablet Take 1,000 mg by mouth 2 (two) times daily as needed for moderate pain, fever or headache.     cholecalciferol (VITAMIN D) 1000 UNITS tablet Take 1,000 Units by mouth daily.     diazepam (VALIUM) 5 MG tablet TAKE 1/2 TO 1 TABLET BY MOUTH AT BEDTIME AS NEEDED FOR SLEEP 30 tablet 1   famotidine (PEPCID) 10 MG tablet Take 10 mg by mouth 2 (two) times daily as needed for heartburn or indigestion.      Fluticasone Propionate (FLONASE NA) Place into the nose as needed.     folic acid (FOLVITE) 767 MCG tablet Take 400 mcg by mouth daily.     ipratropium (ATROVENT) 0.03 % nasal spray Place 2 sprays into both nostrils 3 (three) times daily. Use as needed  for nasal drip 30 mL 6   Krill Oil 500 MG CAPS Take 1,000 mg by mouth daily.     losartan-hydrochlorothiazide (HYZAAR) 100-12.5 MG tablet TAKE 1 TABLET BY MOUTH EVERY DAY 90 tablet 2   propranolol ER (INDERAL LA) 80 MG 24 hr capsule TAKE 1 CAPSULE BY MOUTH EVERY DAY 90 capsule 3   simvastatin (ZOCOR) 10 MG tablet TAKE 1 TABLET BY MOUTH EVERYDAY AT BEDTIME 90 tablet 3   warfarin (COUMADIN) 5 MG tablet TAKE AS DIRECTED BY ANTICOAGULATION CLINIC 360 tablet 1   No current facility-administered medications on file prior to visit.    Review of Systems:  As per HPI- otherwise negative.   Physical Examination: Vitals:   07/28/22 1058  BP: 118/70  Pulse: 75  Resp: 18  Temp: 97.8 F (36.6 C)  SpO2: 97%   Vitals:   07/28/22 1058  Weight: 212 lb 12.8 oz (96.5 kg)  Height: '5\' 10"'$  (1.778 m)   Body mass index is 30.53 kg/m. Ideal Body Weight: Weight in (lb) to have BMI = 25: 173.9  GEN: no acute distress.  Mildly obese, looks well HEENT: Atraumatic, Normocephalic.  Ears and Nose: No external deformity. CV: RRR, No M/G/R. No JVD. No thrill. No extra heart sounds. PULM: CTA B, no wheezes, crackles, rhonchi. No retractions. No resp. distress. No accessory muscle use. ABD: S, NT, ND, +BS. No rebound. No HSM.  Belly is benign EXTR: No c/c/e PSYCH: Normally interactive. Conversant.  Normal strength and deep tendon reflex of both lower extremities  Assessment and Plan: Hyperlipidemia, unspecified hyperlipidemia type - Plan: Lipid panel  Essential hypertension - Plan: CBC, Comprehensive metabolic panel  Gastroesophageal reflux disease, unspecified whether esophagitis present - Plan: H. pylori breath test, omeprazole (PRILOSEC) 40 MG  capsule, sucralfate (CARAFATE) 1 g tablet  Factor 5 Leiden mutation, heterozygous ([place]) - Plan: POCT INR  Essential tremor  Results for orders placed or performed in visit on 07/28/22  POCT INR  Result Value Ref Range   INR     POC INR 2.6    Checked INR for patient today, he will follow-up with his Coumadin clinic provider-goal range 2-3 Lab work pending as above Blood pressure well-controlled Increasing reflux symptoms, will use Prilosec daily for 30 days and Carafate for 10 days.  Check H. Pylori Will plan further follow- up pending labs. Plan for 88-monthfollow-up assuming all is well  Signed JLamar Blinks MD  Received labs as below, message to patient  Results for orders placed or performed in visit on 07/28/22  CBC  Result Value Ref Range   WBC 8.6 4.0 - 10.5 K/uL   RBC 4.90 4.22 - 5.81 Mil/uL   Platelets 155.0 150.0 - 400.0 K/uL   Hemoglobin 16.0 13.0 - 17.0 g/dL   HCT 46.4 39.0 - 52.0 %   MCV 94.8 78.0 - 100.0 fl   MCHC 34.6 30.0 - 36.0 g/dL   RDW 14.3 11.5 - 15.5 %  Comprehensive metabolic panel  Result Value Ref Range   Sodium 136 135 - 145 mEq/L   Potassium 4.3 3.5 - 5.1 mEq/L   Chloride 97 96 - 112 mEq/L   CO2 31 19 - 32 mEq/L   Glucose, Bld 109 (H) 70 - 99 mg/dL   BUN 16 6 - 23 mg/dL   Creatinine, Ser 0.82 0.40 - 1.50 mg/dL   Total Bilirubin 2.8 (H) 0.2 - 1.2 mg/dL   Alkaline Phosphatase 75 39 - 117 U/L   AST 27 0 -  37 U/L   ALT 33 0 - 53 U/L   Total Protein 7.9 6.0 - 8.3 g/dL   Albumin 4.6 3.5 - 5.2 g/dL   GFR 79.94 >60.00 mL/min   Calcium 9.9 8.4 - 10.5 mg/dL  Lipid panel  Result Value Ref Range   Cholesterol 177 0 - 200 mg/dL   Triglycerides 252.0 (H) 0.0 - 149.0 mg/dL   HDL 41.80 >39.00 mg/dL   VLDL 50.4 (H) 0.0 - 40.0 mg/dL   Total CHOL/HDL Ratio 4    NonHDL 135.64   LDL cholesterol, direct  Result Value Ref Range   Direct LDL 100.0 mg/dL  POCT INR  Result Value Ref Range   INR     POC INR 2.6

## 2022-07-28 ENCOUNTER — Ambulatory Visit (INDEPENDENT_AMBULATORY_CARE_PROVIDER_SITE_OTHER): Payer: Medicare Other | Admitting: Family Medicine

## 2022-07-28 ENCOUNTER — Encounter: Payer: Self-pay | Admitting: Family Medicine

## 2022-07-28 ENCOUNTER — Ambulatory Visit (INDEPENDENT_AMBULATORY_CARE_PROVIDER_SITE_OTHER): Payer: Medicare Other

## 2022-07-28 VITALS — BP 118/70 | HR 75 | Temp 97.8°F | Resp 18 | Ht 70.0 in | Wt 212.8 lb

## 2022-07-28 DIAGNOSIS — E785 Hyperlipidemia, unspecified: Secondary | ICD-10-CM | POA: Diagnosis not present

## 2022-07-28 DIAGNOSIS — D6851 Activated protein C resistance: Secondary | ICD-10-CM

## 2022-07-28 DIAGNOSIS — K219 Gastro-esophageal reflux disease without esophagitis: Secondary | ICD-10-CM | POA: Diagnosis not present

## 2022-07-28 DIAGNOSIS — I10 Essential (primary) hypertension: Secondary | ICD-10-CM

## 2022-07-28 DIAGNOSIS — G250 Essential tremor: Secondary | ICD-10-CM

## 2022-07-28 LAB — COMPREHENSIVE METABOLIC PANEL
ALT: 33 U/L (ref 0–53)
AST: 27 U/L (ref 0–37)
Albumin: 4.6 g/dL (ref 3.5–5.2)
Alkaline Phosphatase: 75 U/L (ref 39–117)
BUN: 16 mg/dL (ref 6–23)
CO2: 31 mEq/L (ref 19–32)
Calcium: 9.9 mg/dL (ref 8.4–10.5)
Chloride: 97 mEq/L (ref 96–112)
Creatinine, Ser: 0.82 mg/dL (ref 0.40–1.50)
GFR: 79.94 mL/min (ref >60.00)
Glucose, Bld: 109 mg/dL — ABNORMAL HIGH (ref 70–99)
Potassium: 4.3 mEq/L (ref 3.5–5.1)
Sodium: 136 mEq/L (ref 135–145)
Total Bilirubin: 2.8 mg/dL — ABNORMAL HIGH (ref 0.2–1.2)
Total Protein: 7.9 g/dL (ref 6.0–8.3)

## 2022-07-28 LAB — CBC
HCT: 46.4 % (ref 39.0–52.0)
Hemoglobin: 16 g/dL (ref 13.0–17.0)
MCHC: 34.6 g/dL (ref 30.0–36.0)
MCV: 94.8 fl (ref 78.0–100.0)
Platelets: 155 10*3/uL (ref 150.0–400.0)
RBC: 4.9 Mil/uL (ref 4.22–5.81)
RDW: 14.3 % (ref 11.5–15.5)
WBC: 8.6 10*3/uL (ref 4.0–10.5)

## 2022-07-28 LAB — LDL CHOLESTEROL, DIRECT: Direct LDL: 100 mg/dL

## 2022-07-28 LAB — POCT INR
INR: 2.6 (ref 2.0–3.0)
POC INR: 2.6

## 2022-07-28 LAB — LIPID PANEL
Cholesterol: 177 mg/dL (ref 0–200)
HDL: 41.8 mg/dL (ref >39.00)
NonHDL: 135.64
Total CHOL/HDL Ratio: 4
Triglycerides: 252 mg/dL — ABNORMAL HIGH (ref 0.0–149.0)
VLDL: 50.4 mg/dL — ABNORMAL HIGH (ref 0.0–40.0)

## 2022-07-28 MED ORDER — OMEPRAZOLE 40 MG PO CPDR: 40.0000 mg | DELAYED_RELEASE_CAPSULE | Freq: Every day | ORAL | 3 refills | Status: DC

## 2022-07-28 MED ORDER — SUCRALFATE 1 G PO TABS: 1.0000 g | ORAL_TABLET | Freq: Three times a day (TID) | ORAL | 3 refills | Status: DC

## 2022-07-28 NOTE — Progress Notes (Unsigned)
Pt had PCP apt today where POCT INR was performed to save the pt from having to come in for another apt for INR check.  Continue to take 1/2 tablet all days except 1 tablet on Tuesday and Saturdays.  Recheck in 8 weeks per pt request. Contacted pt by phone and advised. Pt verbalized understanding.

## 2022-07-28 NOTE — Patient Instructions (Signed)
Pre visit review using our clinic review tool, if applicable. No additional management support is needed unless otherwise documented below in the visit note.  Continue to take 1/2 tablet all days except 1 tablet on Tuesday and Saturdays.  Recheck in 8 weeks per pt request.

## 2022-07-29 ENCOUNTER — Ambulatory Visit: Payer: Medicare Other

## 2022-08-01 ENCOUNTER — Encounter: Payer: Self-pay | Admitting: Family Medicine

## 2022-08-01 LAB — H. PYLORI BREATH TEST: H. pylori Breath Test: NOT DETECTED

## 2022-08-04 ENCOUNTER — Encounter: Payer: Self-pay | Admitting: Family Medicine

## 2022-08-04 MED ORDER — OSELTAMIVIR PHOSPHATE 75 MG PO CAPS: 75.0000 mg | ORAL_CAPSULE | Freq: Two times a day (BID) | ORAL | 0 refills | Status: DC

## 2022-08-04 NOTE — Telephone Encounter (Signed)
Called pt to discuss his sx - he was seen just about a week ago for a routine check up He sent a mychart with concern of cough and chills  He got sick about 2 days ago- started with a dry cough Last night he felt a lot worse- he noted chills but he has not run a fever He was with his grandson over the weekend at his graduation- and he was just dx with the flu Patrick Hatfield notes he feels pretty awful- achy and exhausted He has not tested for covid as of yet  We will go ahead and start Tamiflu, I asked him to pick up a COVID test and test for COVID as well  Appt for tomorrow with my partner Dr. Carollee Herter at 11:40 AM  Meds ordered this encounter  Medications   oseltamivir (TAMIFLU) 75 MG capsule    Sig: Take 1 capsule (75 mg total) by mouth 2 (two) times daily.    Dispense:  10 capsule    Refill:  0

## 2022-08-05 ENCOUNTER — Other Ambulatory Visit (HOSPITAL_BASED_OUTPATIENT_CLINIC_OR_DEPARTMENT_OTHER): Payer: Self-pay

## 2022-08-05 ENCOUNTER — Ambulatory Visit (INDEPENDENT_AMBULATORY_CARE_PROVIDER_SITE_OTHER): Payer: Medicare Other | Admitting: Family Medicine

## 2022-08-05 ENCOUNTER — Encounter: Payer: Self-pay | Admitting: Family Medicine

## 2022-08-05 VITALS — BP 138/88 | HR 68 | Temp 99.3°F | Resp 18 | Ht 70.0 in | Wt 212.2 lb

## 2022-08-05 DIAGNOSIS — R051 Acute cough: Secondary | ICD-10-CM

## 2022-08-05 DIAGNOSIS — J111 Influenza due to unidentified influenza virus with other respiratory manifestations: Secondary | ICD-10-CM | POA: Insufficient documentation

## 2022-08-05 LAB — POC COVID19 BINAXNOW: SARS Coronavirus 2 Ag: NEGATIVE

## 2022-08-05 LAB — POCT INFLUENZA A/B
Influenza A, POC: NEGATIVE
Influenza B, POC: NEGATIVE

## 2022-08-05 MED ORDER — AZITHROMYCIN 250 MG PO TABS: ORAL_TABLET | ORAL | 0 refills | Status: DC

## 2022-08-05 MED ORDER — OSELTAMIVIR PHOSPHATE 75 MG PO CAPS
75.0000 mg | ORAL_CAPSULE | Freq: Two times a day (BID) | ORAL | 0 refills | Status: DC
  Filled 2022-08-05: qty 10, 5d supply, fill #0

## 2022-08-05 NOTE — Assessment & Plan Note (Signed)
Flu test was neg but clinically looks like flu Tamiflu was sent yesterday but pharmacy did not get it Sent in again

## 2022-08-05 NOTE — Patient Instructions (Signed)

## 2022-08-05 NOTE — Progress Notes (Signed)
Subjective:   By signing my name below, I, Luna Glasgow, attest that this documentation has been prepared under the direction and in the presence of Roma Schanz, 08/05/2022.   Patient ID: Patrick Hatfield, male    DOB: Dec 09, 1934, 87 y.o.   MRN: [id number]  Chief Complaint  Patient presents with  . Cough    See phone note from Dr. Lorelei Pont. Dr. Lorelei Pont sent in Tamiflu for patient. He states he hasn't picked it up yet. Pt wants a COVID test    HPI Patient is in today for an office visit.  Cough Patient is complaining of body aches, intermittent nonproductive cough, and fatigue occurring two nights ago. He reports that he then started to experience severe coughing at night. Patient had appointment with Dr. Lorelei Pont yesterday for his cough and was prescribed Tamiflu. He explains that he was told he will get a phone call when his medication was ready to be picked up and has yet to receive a phone call. He has managed his symptoms with robitussin with relief. He denies fever and sore throat.  Health Maintenance Due  Topic Date Due  . COVID-19 Vaccine (4 - 2023-24 season) 04/18/2022    Past Medical History:  Diagnosis Date  . Anxiety   . Arthritis    KNEES,ANKLES,HANDS  . Asthma    AS A TEENAGER  . Barrett's esophagus   . Benign essential tremor   . Clotting disorder ([place])    FACTOR 5  . Colitis in 20's  . Colon polyps   . Congenital deficiency of other clotting factors    factor 5  . Depression 2006   drug overdose  . Diverticulosis   . DVT (deep venous thrombosis) ([place]) 1991 & 2010   X 2 in context Factor 5 def  . Esophageal reflux   . Factor 5 Leiden mutation, heterozygous ([place])   . Gilbert's syndrome   . H/O ischemic bowel disease 2010   while off warfarin   . Herpes zoster 2010   facial  . Hiatal hernia   . Hx of adenomatous colonic polyps 12/14/2014  . Hyperlipidemia   . Hypertension   . Insomnia   . Osteoporosis   . Personal history of colonic polyps  05/28/2007   adenomatous  . Proteinuria age 26  . Thrombosis of mesenteric vein ([place]) 07/12/2013   03/2009  . Umbilical hernia     Past Surgical History:  Procedure Laterality Date  . CHOLECYSTECTOMY  05/16/2011    Dr Marlou Starks  . COLONOSCOPY     Dr Sharlett Iles  . CYSTOSCOPY     Neg  . EYE SURGERY Bilateral 10/16/2017   secondary catarct surgery.  Marland Kitchen KNEE ARTHROSCOPY Bilateral 2008, 2012   Dr.Aplington bilateral  . TONSILLECTOMY    . TRANSURETHRAL RESECTION OF PROSTATE      BPH;Dr Dahlstedt    Family History  Problem Relation Age of Onset  . Pancreatic cancer Father 41  . Ulcers Father   . Stroke Father 17  . Diabetes Father   . Coronary artery disease Mother   . Osteoporosis Mother   . HIV Brother   . Multiple sclerosis Sister   . Heart attack Paternal Grandfather        early 55s  . Heart attack Paternal Uncle        2 uncles in early 75s  . Aortic aneurysm Brother   . Colon cancer Neg Hx   . Esophageal cancer Neg Hx   . Stomach cancer  Neg Hx     Social History   Socioeconomic History  . Marital status: Married    Spouse name: Not on file  . Number of children: 4  . Years of education: Not on file  . Highest education level: Not on file  Occupational History  . Occupation: Retired    Fish farm manager: RETIRED  Tobacco Use  . Smoking status: Former    Packs/day: 2.00    Years: 40.00    Total pack years: 80.00    Types: Cigarettes    Quit date: 08/18/1994    Years since quitting: 27.9  . Smokeless tobacco: Never  . Tobacco comments:    smoked 1956-1996, up to 2 ppd  Vaping Use  . Vaping Use: Never used  Substance and Sexual Activity  . Alcohol use: Yes    Alcohol/week: 3.0 standard drinks of alcohol    Types: 3 Cans of beer per week  . Drug use: No  . Sexual activity: Not Currently    Birth control/protection: None    Comment: Married  Other Topics Concern  . Not on file  Social History Narrative   Married, lives with spouse   Married x56 years   4  daughters   4 grandchildren   Retired - Armed forces operational officer, sales/technology   Social Determinants of Health   Financial Resource Strain: Low Risk  (10/07/2021)   Overall Financial Resource Strain ([place])   . Difficulty of Paying Living Expenses: Not hard at all  Food Insecurity: No Food Insecurity (04/07/2022)   Hunger Vital Sign   . Worried About Charity fundraiser in the Last Year: Never true   . Ran Out of Food in the Last Year: Never true  Transportation Needs: No Transportation Needs (10/07/2021)   PRAPARE - Transportation   . Lack of Transportation (Medical): No   . Lack of Transportation (Non-Medical): No  Physical Activity: Insufficiently Active (04/07/2022)   Exercise Vital Sign   . Days of Exercise per Week: 7 days   . Minutes of Exercise per Session: 20 min  Stress: Not on file  Social Connections: Socially Integrated (04/07/2022)   Social Connection and Isolation Panel [NHANES]   . Frequency of Communication with Friends and Family: More than three times a week   . Frequency of Social Gatherings with Friends and Family: Twice a week   . Attends Religious Services: More than 4 times per year   . Active Member of Clubs or Organizations: Yes   . Attends Archivist Meetings: 1 to 4 times per year   . Marital Status: Married  Human resources officer Violence: Not on file    Outpatient Medications Prior to Visit  Medication Sig Dispense Refill  . acetaminophen (TYLENOL) 500 MG tablet Take 1,000 mg by mouth 2 (two) times daily as needed for moderate pain, fever or headache.    . cholecalciferol (VITAMIN D) 1000 UNITS tablet Take 1,000 Units by mouth daily.    . diazepam (VALIUM) 5 MG tablet TAKE 1/2 TO 1 TABLET BY MOUTH AT BEDTIME AS NEEDED FOR SLEEP 30 tablet 1  . famotidine (PEPCID) 10 MG tablet Take 10 mg by mouth 2 (two) times daily as needed for heartburn or indigestion.    . Fluticasone Propionate (FLONASE NA) Place into the nose as needed.    . folic acid (FOLVITE)  517 MCG tablet Take 400 mcg by mouth daily.    Marland Kitchen ipratropium (ATROVENT) 0.03 % nasal spray Place 2 sprays into both nostrils 3 (three)  times daily. Use as needed for nasal drip 30 mL 6  . Krill Oil 500 MG CAPS Take 1,000 mg by mouth daily.    Marland Kitchen losartan-hydrochlorothiazide (HYZAAR) 100-12.5 MG tablet TAKE 1 TABLET BY MOUTH EVERY DAY 90 tablet 2  . omeprazole (PRILOSEC) 40 MG capsule Take 1 capsule (40 mg total) by mouth daily. Take for 30 days, can continue longer if needed 30 capsule 3  . oseltamivir (TAMIFLU) 75 MG capsule Take 1 capsule (75 mg total) by mouth 2 (two) times daily. 10 capsule 0  . propranolol ER (INDERAL LA) 80 MG 24 hr capsule TAKE 1 CAPSULE BY MOUTH EVERY DAY 90 capsule 3  . simvastatin (ZOCOR) 10 MG tablet TAKE 1 TABLET BY MOUTH EVERYDAY AT BEDTIME 90 tablet 3  . sucralfate (CARAFATE) 1 g tablet Take 1 tablet (1 g total) by mouth 4 (four) times daily -  with meals and at bedtime. 40 tablet 3  . warfarin (COUMADIN) 5 MG tablet TAKE AS DIRECTED BY ANTICOAGULATION CLINIC 360 tablet 1   No facility-administered medications prior to visit.    Allergies  Allergen Reactions  . Amlodipine Besy-Benazepril Hcl     REACTION: swelling of feet.  Probably  from Amlodipine component)  . Lipitor [Atorvastatin Calcium]     ? Reaction, ? Swelling=- update 7/20.  Pt recalls his legs hurt and perhaps swelled slightly - JC    Review of Systems  Constitutional:  Positive for malaise/fatigue. Negative for fever.  HENT:  Negative for congestion and sore throat.   Eyes:  Negative for blurred vision.  Respiratory:  Positive for cough. Negative for shortness of breath.   Cardiovascular:  Negative for chest pain, palpitations and leg swelling.  Gastrointestinal:  Negative for vomiting.  Musculoskeletal:  Negative for back pain.  Skin:  Negative for rash.  Neurological:  Negative for loss of consciousness and headaches.       Objective:    Physical Exam Vitals and nursing note reviewed.   Constitutional:      General: He is not in acute distress.    Appearance: Normal appearance. He is not ill-appearing.  HENT:     Head: Normocephalic and atraumatic.     Right Ear: External ear normal.     Left Ear: External ear normal.  Eyes:     Extraocular Movements: Extraocular movements intact.     Pupils: Pupils are equal, round, and reactive to light.  Cardiovascular:     Rate and Rhythm: Normal rate and regular rhythm.     Heart sounds: Normal heart sounds. No murmur heard.    No gallop.  Pulmonary:     Effort: Pulmonary effort is normal. No respiratory distress.     Breath sounds: Normal breath sounds. No wheezing or rales.  Skin:    General: Skin is warm and dry.  Neurological:     Mental Status: He is alert and oriented to person, place, and time.  Psychiatric:        Judgment: Judgment normal.    BP 138/88 (BP Location: Left Arm, Patient Position: Sitting, Cuff Size: Normal)   Pulse 68   Temp 99.3 F (37.4 C) (Oral)   Resp 18   Ht '5\' 10"'$  (1.778 m)   Wt 212 lb 3.2 oz (96.3 kg)   SpO2 95%   BMI 30.45 kg/m  Wt Readings from Last 3 Encounters:  08/05/22 212 lb 3.2 oz (96.3 kg)  07/28/22 212 lb 12.8 oz (96.5 kg)  01/22/22 212 lb 6.4 oz (  96.3 kg)       Assessment & Plan:   Problem List Items Addressed This Visit   None Visit Diagnoses     Acute cough    -  Primary   Relevant Orders   POC COVID-19      No orders of the defined types were placed in this encounter.   I, Roma Schanz, personally preformed the services described in this documentation.  All medical record entries made by the scribe were at my direction and in my presence.  I have reviewed the chart and discharge instructions (if applicable) and agree that the record reflects my personal performance and is accurate and complete. 08/05/2022.   I,Verona Buck,acting as a Education administrator for [employer], DO.,have documented all relevant documentation on the behalf of Ann Held, DO,as directed by  Ann Held, DO while in the presence of Ann Held, DO.    Luna Glasgow

## 2022-08-05 NOTE — Assessment & Plan Note (Signed)
Zithromax ---- pt will take if symptoms do not improve by end of week

## 2022-08-19 ENCOUNTER — Encounter: Payer: Self-pay | Admitting: Family Medicine

## 2022-09-23 ENCOUNTER — Ambulatory Visit (INDEPENDENT_AMBULATORY_CARE_PROVIDER_SITE_OTHER): Payer: Medicare Other

## 2022-09-23 DIAGNOSIS — Z7901 Long term (current) use of anticoagulants: Secondary | ICD-10-CM | POA: Diagnosis not present

## 2022-09-23 LAB — POCT INR: INR: 2.2 (ref 2.0–3.0)

## 2022-09-23 NOTE — Progress Notes (Cosign Needed)
Continue to take 1/2 tablet all days except 1 tablet on Tuesday and Saturdays.  Recheck in 8 weeks per pt request.

## 2022-09-23 NOTE — Patient Instructions (Addendum)
Pre visit review using our clinic review tool, if applicable. No additional management support is needed unless otherwise documented below in the visit note.  Continue to take 1/2 tablet all days except 1 tablet on Tuesday and Saturdays.  Recheck in 8 weeks

## 2022-09-24 NOTE — Addendum Note (Signed)
Addended by: Randall An A on: 09/24/2022 10:29 AM   Modules accepted: Level of Service

## 2022-10-12 ENCOUNTER — Encounter: Payer: Self-pay | Admitting: Family Medicine

## 2022-10-23 ENCOUNTER — Other Ambulatory Visit: Payer: Self-pay | Admitting: Family Medicine

## 2022-10-23 DIAGNOSIS — K219 Gastro-esophageal reflux disease without esophagitis: Secondary | ICD-10-CM

## 2022-10-28 ENCOUNTER — Other Ambulatory Visit: Payer: Self-pay | Admitting: Family Medicine

## 2022-10-28 DIAGNOSIS — M62838 Other muscle spasm: Secondary | ICD-10-CM

## 2022-11-18 ENCOUNTER — Ambulatory Visit (INDEPENDENT_AMBULATORY_CARE_PROVIDER_SITE_OTHER): Payer: Medicare Other

## 2022-11-18 DIAGNOSIS — Z7901 Long term (current) use of anticoagulants: Secondary | ICD-10-CM | POA: Diagnosis not present

## 2022-11-18 LAB — POCT INR: INR: 3 (ref 2.0–3.0)

## 2022-11-18 NOTE — Patient Instructions (Addendum)
Pre visit review using our clinic review tool, if applicable. No additional management support is needed unless otherwise documented below in the visit note. ? ?Continue to take 1/2 tablet all days except 1 tablet on Tuesday and Saturdays.  Re-check in 8 weeks per pt request. ?

## 2022-11-18 NOTE — Progress Notes (Signed)
Continue to take 1/2 tablet all days except 1 tablet on Tuesday and Saturdays.  Recheck in 8 weeks per pt request. 

## 2022-11-19 ENCOUNTER — Other Ambulatory Visit: Payer: Self-pay | Admitting: Family Medicine

## 2022-11-19 DIAGNOSIS — Z7901 Long term (current) use of anticoagulants: Secondary | ICD-10-CM

## 2022-11-19 MED ORDER — WARFARIN SODIUM 5 MG PO TABS: ORAL_TABLET | ORAL | 1 refills | Status: DC

## 2022-11-19 NOTE — Telephone Encounter (Signed)
Received error code that prescription was not sent due to an error.   Resent prescription for warfarin.

## 2022-11-19 NOTE — Addendum Note (Signed)
Addended by: Randall An A on: 11/19/2022 10:05 AM   Modules accepted: Orders

## 2022-11-19 NOTE — Telephone Encounter (Signed)
Pt is compliant with warfarin management and PCP apts.  Sent in refill of warfarin to requested pharmacy.      

## 2022-11-24 ENCOUNTER — Encounter: Payer: Self-pay | Admitting: Family Medicine

## 2022-12-24 ENCOUNTER — Encounter (INDEPENDENT_AMBULATORY_CARE_PROVIDER_SITE_OTHER): Payer: Medicare Other | Admitting: Family Medicine

## 2022-12-24 DIAGNOSIS — Z5181 Encounter for therapeutic drug level monitoring: Secondary | ICD-10-CM

## 2022-12-24 MED ORDER — DOXYCYCLINE HYCLATE 100 MG PO CAPS: 100.0000 mg | ORAL_CAPSULE | Freq: Two times a day (BID) | ORAL | 0 refills | Status: DC

## 2022-12-24 NOTE — Telephone Encounter (Signed)
Please see the MyChart message reply(ies) for my assessment and plan.  The patient gave consent for this Medical Advice Message and is aware that it may result in a bill to their insurance company as well as the possibility that this may result in a co-payment or deductible. They are an established patient, but are not seeking medical advice exclusively about a problem treated during an in person or video visit in the last 7 days. I did not recommend an in person or video visit within 7 days of my reply.  I spent a total of 10 minutes cumulative time within 7 days through MyChart messaging Ishmael Berkovich, MD  

## 2022-12-24 NOTE — Addendum Note (Signed)
Addended by: Abbe Amsterdam C on: 12/24/2022 02:30 PM   Modules accepted: Orders

## 2022-12-29 ENCOUNTER — Other Ambulatory Visit (INDEPENDENT_AMBULATORY_CARE_PROVIDER_SITE_OTHER): Payer: Medicare Other

## 2022-12-29 ENCOUNTER — Encounter: Payer: Self-pay | Admitting: Family Medicine

## 2022-12-29 DIAGNOSIS — Z5181 Encounter for therapeutic drug level monitoring: Secondary | ICD-10-CM | POA: Diagnosis not present

## 2022-12-29 LAB — PROTIME-INR
INR: 2.3 ratio — ABNORMAL HIGH (ref 0.8–1.0)
Prothrombin Time: 23.8 s — ABNORMAL HIGH (ref 9.6–13.1)

## 2023-01-12 ENCOUNTER — Encounter: Payer: Self-pay | Admitting: Family Medicine

## 2023-01-13 ENCOUNTER — Ambulatory Visit: Payer: Medicare Other

## 2023-01-13 NOTE — Progress Notes (Unsigned)
[facility] [address], [address] [place], [place] [address] [phone]  Date:  01/14/2023   Name:  Patrick Hatfield   DOB:  01-03-1935   MRN:  [id number]  PCP:  Pearline Cables, MD    Chief Complaint: continued URI sxs (Doxy on 12/24/22 100 mg BID x 10 days. Pt says he has not yet turned the corner. He still has a cough, chest congestion, runny nose that never stops, and some hoarseness. )   History of Present Illness:  Patrick Hatfield is a 88 y.o. very pleasant male patient who presents with the following:  Patient seen today with concern of acute illness- history of factor V Leiden, DVT, mesenteric vein thrombosis currently on Coumadin, essential tremor, hypertension, hyperlipidemia He goes to anticoagulation clinic- no abnormal bleeding or bruising  Most recent visit with myself was in December.  However, we have exchanged some messages more recently-he contacted me on May 8 via MyChart:  Good morning Dr. Patsy Lager. I've been troubled with bronchitis for many years, especially this time of year, and have been experiencing growing symptoms over the past week. Slight cough and deep chest congestion that's progressing. Breathing is OK, but slowly getting a little more labored. Can you prescribe something to get it under control before it gets worse. Thank you   I sent in a prescription for doxycycline and we had him come in for an INR check as he is also on Coumadin  He then contacted me again 2 days ago and we arranged this appointment:  Sorry to bother you, but my respiratory problem I thought we had resolved after the antibiotics in mid May has returned.  No fever, but cough and chest congestion with shallow breathing and general crappy feeling.  Not serious, but I know it's not going to get better.  My regular appointment is June 12, but I think I should see you prior to then if possible.  No problem any day any time.   Pt notes  his sx did mostly resolve but then seemed to come back and are not going away  His nose is "running just like a river" and he is still coughing- This makes it hard to sleep He has tried Atrovent nasal but it did not help He is using robitussin OTC for cough Cough does "not seem deep" Voice is a bit hoarse No fever or chills Occasional night sweat but this is not really new for him Appetite is ok  Patient Active Problem List   Diagnosis Date Noted   Acute cough 08/05/2022   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/2013   Thrombocytopenia, unspecified (HCC) 01/05/2013   Sullivan Lone syndrome 01/05/2013   Unspecified adverse effect of unspecified drug, medicinal and biological substance 01/09/2012   Barrett's esophagus 03/24/2011   Factor 5 Leiden mutation, heterozygous (HCC) 03/21/2011   Essential tremor 04/24/2009   Hyperlipidemia 10/03/2008   Essential hypertension 10/03/2008   GERD 10/03/2008   Fasting hyperglycemia 10/03/2008   DVT, HX OF 10/03/2008    Past Medical History:  Diagnosis Date   Anxiety    Arthritis    KNEES,ANKLES,HANDS   Asthma  AS A TEENAGER   Barrett's esophagus    Benign essential tremor    Clotting disorder (HCC)    FACTOR 5   Colitis in 20's   Colon polyps    Congenital deficiency of other clotting factors    factor 5   Depression 2006   drug overdose   Diverticulosis    DVT (deep venous thrombosis) (HCC) 1991 & 2010   X 2 in context Factor 5 def   Esophageal reflux    Factor 5 Leiden mutation, heterozygous (HCC)    Gilbert's syndrome    H/O ischemic bowel disease 2010   while off warfarin    Herpes zoster 2010   facial   Hiatal hernia    Hx of  adenomatous colonic polyps 12/14/2014   Hyperlipidemia    Hypertension    Insomnia    Osteoporosis    Personal history of colonic polyps 05/28/2007   adenomatous   Proteinuria age 89   Thrombosis of mesenteric vein (HCC) 07/12/2013   03/2009   Umbilical hernia     Past Surgical History:  Procedure Laterality Date   CHOLECYSTECTOMY  05/16/2011    Dr Carolynne Edouard   COLONOSCOPY     Dr Jarold Motto   CYSTOSCOPY     Neg   EYE SURGERY Bilateral 10/16/2017   secondary catarct surgery.   KNEE ARTHROSCOPY Bilateral 2008, 2012   Dr.Aplington bilateral   TONSILLECTOMY     TRANSURETHRAL RESECTION OF PROSTATE      BPH;Dr Dahlstedt    Social History   Tobacco Use   Smoking status: Former    Packs/day: 2.00    Years: 40.00    Additional pack years: 0.00    Total pack years: 80.00    Types: Cigarettes    Quit date: 08/18/1994    Years since quitting: 28.4   Smokeless tobacco: Never   Tobacco comments:    smoked 1956-1996, up to 2 ppd  Vaping Use   Vaping Use: Never used  Substance Use Topics   Alcohol use: Yes    Alcohol/week: 3.0 standard drinks of alcohol    Types: 3 Cans of beer per week   Drug use: No    Family History  Problem Relation Age of Onset   Pancreatic cancer Father 40   Ulcers Father    Stroke Father 58   Diabetes Father    Coronary artery disease Mother    Osteoporosis Mother    HIV Brother    Multiple sclerosis Sister    Heart attack Paternal Grandfather        early 8s   Heart attack Paternal Uncle        2 uncles in early 82s   Aortic aneurysm Brother    Colon cancer Neg Hx    Esophageal cancer Neg Hx    Stomach cancer Neg Hx     Allergies  Allergen Reactions   Amlodipine Besy-Benazepril Hcl     REACTION: swelling of feet.  Probably  from Amlodipine component)   Lipitor [Atorvastatin Calcium]     ? Reaction, ? Swelling=- update 7/20.  Pt recalls his legs hurt and perhaps swelled slightly - JC    Medication list has been reviewed and  updated.  Current Outpatient Medications on File Prior to Visit  Medication Sig Dispense Refill   acetaminophen (TYLENOL) 500 MG tablet Take 1,000 mg by mouth 2 (two) times daily as needed for moderate pain, fever or headache.     cholecalciferol (VITAMIN D) 1000 UNITS tablet  Take 1,000 Units by mouth daily.     diazepam (VALIUM) 5 MG tablet TAKE 1/2 TO 1 TABLET BY MOUTH AT BEDTIME AS NEEDED FOR SLEEP 30 tablet 1   famotidine (PEPCID) 10 MG tablet Take 10 mg by mouth 2 (two) times daily as needed for heartburn or indigestion.     Fluticasone Propionate (FLONASE NA) Place into the nose as needed.     folic acid (FOLVITE) 400 MCG tablet Take 400 mcg by mouth daily.     ipratropium (ATROVENT) 0.03 % nasal spray Place 2 sprays into both nostrils 3 (three) times daily. Use as needed for nasal drip 30 mL 6   Krill Oil 500 MG CAPS Take 1,000 mg by mouth daily.     losartan-hydrochlorothiazide (HYZAAR) 100-12.5 MG tablet TAKE 1 TABLET BY MOUTH EVERY DAY 90 tablet 2   omeprazole (PRILOSEC) 40 MG capsule TAKE 1 CAPSULE BY MOUTH EVERY DAY AS NEEDED 90 capsule 1   propranolol ER (INDERAL LA) 80 MG 24 hr capsule TAKE 1 CAPSULE BY MOUTH EVERY DAY 90 capsule 3   simvastatin (ZOCOR) 10 MG tablet TAKE 1 TABLET BY MOUTH EVERYDAY AT BEDTIME 90 tablet 3   warfarin (COUMADIN) 5 MG tablet TAKE 1/2 TABLET BY MOUTH DAILY EXCEPT TAKE 1 TABLET ON TUESDAYS AND SATURDAYS OR AS DIRECTED BY ANTICOAGULATION CLINIC 90 tablet 1   No current facility-administered medications on file prior to visit.    Review of Systems:  As per HPI- otherwise negative.   Physical Examination: Vitals:   01/14/23 0946  BP: 118/72  Pulse: 95  Resp: 18  Temp: 97.6 F (36.4 C)  SpO2: 99%   Vitals:   01/14/23 0946  Weight: 209 lb 3.2 oz (94.9 kg)  Height: 5\' 10"  (1.778 m)   Body mass index is 30.02 kg/m. Ideal Body Weight: Weight in (lb) to have BMI = 25: 173.9  GEN: no acute distress.  Mild obesity, looks well  HEENT:  Atraumatic, Normocephalic.  Oropharynx is injected with some possible mild exudate  Ears and Nose: No external deformity. CV: RRR, No M/G/R. No JVD. No thrill. No extra heart sounds. PULM: CTA B, no wheezes, crackles, rhonchi. No retractions. No resp. distress. No accessory muscle use. ABD: S, NT, ND, EXTR: No c/c/e PSYCH: Normally interactive. Conversant.   Results for orders placed or performed in visit on 01/14/23  POCT rapid strep A  Result Value Ref Range   Rapid Strep A Screen Negative Negative    Assessment and Plan: Sore throat - Plan: POCT rapid strep A  Hyperbilirubinemia - Plan: Bilirubin, fractionated(tot/dir/indir), Comp Met (CMET)  Acute cough - Plan: DG Chest 2 View, CBC, azithromycin (ZITHROMAX) 250 MG tablet, predniSONE (DELTASONE) 20 MG tablet  Patient seen today with sore throat, returned current cough and rhinorrhea.  I treated him earlier this month with doxycycline, symptoms temporarily improved but then came back.  We decided to get a chest x-ray and some lab work today I will also follow-up on isolated hyperbilirubinemia Signed Abbe Amsterdam, MD I received patient's chest x-ray, sent a MyChart message.  We decided to start on azithromycin and prednisone, scheduled in a visit for Monday to recheck INR DG Chest 2 View  Result Date: 01/14/2023 CLINICAL DATA:  Persistent cough for 2 months. EXAM: CHEST - 2 VIEW COMPARISON:  Radiographs 01/18/2021 and 11/28/2015.  CT 04/27/2020. FINDINGS: The heart size and mediastinal contours are stable. There is aortic atherosclerosis. The lungs appear unchanged with mild chronic central airway thickening. No evidence  of superimposed airspace disease, edema, pleural effusion or pneumothorax. No acute osseous findings are evident. There are mild degenerative changes in the spine. Cholecystectomy clips noted. IMPRESSION: Stable chest. No evidence of acute cardiopulmonary process. Chronic central airway thickening. Electronically  Signed   By: Carey Bullocks M.D.   On: 01/14/2023 11:01     Received labs as below, message to patient Results for orders placed or performed in visit on 01/14/23  CBC  Result Value Ref Range   WBC 8.4 4.0 - 10.5 K/uL   RBC 4.66 4.22 - 5.81 Mil/uL   Platelets 130.0 (L) 150.0 - 400.0 K/uL   Hemoglobin 15.0 13.0 - 17.0 g/dL   HCT 25.9 56.3 - 87.5 %   MCV 94.5 78.0 - 100.0 fl   MCHC 33.9 30.0 - 36.0 g/dL   RDW 64.3 32.9 - 51.8 %  Comp Met (CMET)  Result Value Ref Range   Sodium 136 135 - 145 mEq/L   Potassium 4.0 3.5 - 5.1 mEq/L   Chloride 100 96 - 112 mEq/L   CO2 29 19 - 32 mEq/L   Glucose, Bld 102 (H) 70 - 99 mg/dL   BUN 12 6 - 23 mg/dL   Creatinine, Ser 8.41 0.40 - 1.50 mg/dL   Total Bilirubin 2.6 (H) 0.2 - 1.2 mg/dL   Alkaline Phosphatase 77 39 - 117 U/L   AST 17 0 - 37 U/L   ALT 18 0 - 53 U/L   Total Protein 7.6 6.0 - 8.3 g/dL   Albumin 4.0 3.5 - 5.2 g/dL   GFR 66.06 >30.16 mL/min   Calcium 9.1 8.4 - 10.5 mg/dL  POCT rapid strep A  Result Value Ref Range   Rapid Strep A Screen Negative Negative

## 2023-01-13 NOTE — Telephone Encounter (Signed)
Pt was prescribed Doxy on 12/24/22 100 mg BID x 10 days. Doesn't look like he came in for the appointment that he was scheduled for at that time. Would you like to see him this time?

## 2023-01-14 ENCOUNTER — Encounter: Payer: Self-pay | Admitting: Family Medicine

## 2023-01-14 ENCOUNTER — Ambulatory Visit (HOSPITAL_BASED_OUTPATIENT_CLINIC_OR_DEPARTMENT_OTHER)
Admission: RE | Admit: 2023-01-14 | Discharge: 2023-01-14 | Disposition: A | Discharge status: Home / Self Care | Payer: Medicare Other | Source: Ambulatory Visit | Attending: Family Medicine | Admitting: Family Medicine

## 2023-01-14 ENCOUNTER — Ambulatory Visit (INDEPENDENT_AMBULATORY_CARE_PROVIDER_SITE_OTHER): Payer: Medicare Other | Admitting: Family Medicine

## 2023-01-14 VITALS — BP 118/72 | HR 95 | Temp 97.6°F | Resp 18 | Ht 70.0 in | Wt 209.2 lb

## 2023-01-14 DIAGNOSIS — R051 Acute cough: Secondary | ICD-10-CM | POA: Diagnosis not present

## 2023-01-14 DIAGNOSIS — R059 Cough, unspecified: Secondary | ICD-10-CM | POA: Diagnosis not present

## 2023-01-14 DIAGNOSIS — J029 Acute pharyngitis, unspecified: Secondary | ICD-10-CM | POA: Diagnosis not present

## 2023-01-14 LAB — COMPREHENSIVE METABOLIC PANEL
ALT: 18 U/L (ref 0–53)
AST: 17 U/L (ref 0–37)
Albumin: 4 g/dL (ref 3.5–5.2)
Alkaline Phosphatase: 77 U/L (ref 39–117)
BUN: 12 mg/dL (ref 6–23)
CO2: 29 mEq/L (ref 19–32)
Calcium: 9.1 mg/dL (ref 8.4–10.5)
Chloride: 100 mEq/L (ref 96–112)
Creatinine, Ser: 0.85 mg/dL (ref 0.40–1.50)
GFR: 78.82 mL/min (ref >60.00)
Glucose, Bld: 102 mg/dL — ABNORMAL HIGH (ref 70–99)
Potassium: 4 mEq/L (ref 3.5–5.1)
Sodium: 136 mEq/L (ref 135–145)
Total Bilirubin: 2.6 mg/dL — ABNORMAL HIGH (ref 0.2–1.2)
Total Protein: 7.6 g/dL (ref 6.0–8.3)

## 2023-01-14 LAB — CBC
HCT: 44.1 % (ref 39.0–52.0)
Hemoglobin: 15 g/dL (ref 13.0–17.0)
MCHC: 33.9 g/dL (ref 30.0–36.0)
MCV: 94.5 fl (ref 78.0–100.0)
Platelets: 130 10*3/uL — ABNORMAL LOW (ref 150.0–400.0)
RBC: 4.66 Mil/uL (ref 4.22–5.81)
RDW: 13.5 % (ref 11.5–15.5)
WBC: 8.4 10*3/uL (ref 4.0–10.5)

## 2023-01-14 LAB — POCT RAPID STREP A (OFFICE): Rapid Strep A Screen: NEGATIVE

## 2023-01-14 MED ORDER — AZITHROMYCIN 250 MG PO TABS
ORAL_TABLET | ORAL | 0 refills | Status: AC
Start: 2023-01-14 — End: 2023-01-19

## 2023-01-14 MED ORDER — PREDNISONE 20 MG PO TABS
ORAL_TABLET | ORAL | 0 refills | Status: DC
Start: 2023-01-14 — End: 2023-03-18

## 2023-01-14 NOTE — Telephone Encounter (Addendum)
We normally dont do NV's on Mondays. But since the pt is occupied, and is on antibiotics I will check with Selena Batten to make sure we can do this.   After speaking with Selena Batten, we are short staffed on Monday 01/19/23. But if the pt can come in for NV at 10 am, I can do the INR check. But we will have to keep our 10 am apt spot open.

## 2023-01-14 NOTE — Patient Instructions (Signed)
Please go to X-ray and I will be in touch with your results and plan asap

## 2023-01-15 ENCOUNTER — Encounter: Payer: Self-pay | Admitting: Family Medicine

## 2023-01-15 LAB — BILIRUBIN, FRACTIONATED(TOT/DIR/INDIR)
Bilirubin, Direct: 0.4 mg/dL — ABNORMAL HIGH (ref 0.0–0.2)
Indirect Bilirubin: 2.3 mg/dL (calc) — ABNORMAL HIGH (ref 0.2–1.2)
Total Bilirubin: 2.7 mg/dL — ABNORMAL HIGH (ref 0.2–1.2)

## 2023-01-16 NOTE — Progress Notes (Unsigned)
[facility] [address] [place], [place] [address] [phone]  Date:  01/19/2023   Name:  Patrick Hatfield   DOB:  1934/09/05   MRN:  [id number]  PCP:  Pearline Cables, MD    Chief Complaint: No chief complaint on file.   History of Present Illness:  Patrick Hatfield is a 88 y.o. very pleasant male patient who presents with the following:  Patient seen today for follow-up, he needs an INR check- history of factor V Leiden, DVT, mesenteric vein thrombosis currently on Coumadin, essential tremor, hypertension, hyperlipidemia.  He was seen last week with concern of persistent cough and chest congestion, we started a course of azithromycin. He is treated with Coumadin, will check INR today due to addition of antibiotic  Patient Active Problem List   Diagnosis Date Noted   Acute cough 08/05/2022   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/2013   Thrombocytopenia, unspecified (HCC) 01/05/2013   Sullivan Lone syndrome 01/05/2013   Unspecified adverse effect of unspecified drug, medicinal and biological substance 01/09/2012   Barrett's esophagus 03/24/2011   Factor 5 Leiden mutation, heterozygous (HCC) 03/21/2011   Essential tremor 04/24/2009   Hyperlipidemia 10/03/2008   Essential hypertension 10/03/2008   GERD 10/03/2008   Fasting hyperglycemia 10/03/2008   DVT, HX OF 10/03/2008    Past Medical History:  Diagnosis Date   Anxiety    Arthritis    KNEES,ANKLES,HANDS   Asthma    AS A TEENAGER   Barrett's esophagus    Benign essential tremor    Clotting disorder (HCC)    FACTOR 5   Colitis in 20's   Colon polyps    Congenital  deficiency of other clotting factors    factor 5   Depression 2006   drug overdose   Diverticulosis    DVT (deep venous thrombosis) (HCC) 1991 & 2010   X 2 in context Factor 5 def   Esophageal reflux    Factor 5 Leiden mutation, heterozygous (HCC)    Gilbert's syndrome    H/O ischemic bowel disease 2010   while off warfarin    Herpes zoster 2010   facial   Hiatal hernia    Hx of adenomatous colonic polyps 12/14/2014   Hyperlipidemia    Hypertension    Insomnia    Osteoporosis    Personal history of colonic polyps 05/28/2007   adenomatous   Proteinuria age 67   Thrombosis of mesenteric vein (HCC) 07/12/2013   03/2009   Umbilical hernia     Past Surgical History:  Procedure Laterality Date   CHOLECYSTECTOMY  05/16/2011    Dr Carolynne Edouard   COLONOSCOPY     Dr Jarold Motto   CYSTOSCOPY     Neg   EYE SURGERY Bilateral 10/16/2017   secondary catarct surgery.   KNEE ARTHROSCOPY Bilateral 2008, 2012   Dr.Aplington bilateral   TONSILLECTOMY     TRANSURETHRAL RESECTION OF PROSTATE      BPH;Dr Dahlstedt    Social History   Tobacco Use   Smoking status: Former    Packs/day: 2.00    Years: 40.00    Additional pack years: 0.00  Total pack years: 80.00    Types: Cigarettes    Quit date: 08/18/1994    Years since quitting: 28.4   Smokeless tobacco: Never   Tobacco comments:    smoked 1956-1996, up to 2 ppd  Vaping Use   Vaping Use: Never used  Substance Use Topics   Alcohol use: Yes    Alcohol/week: 3.0 standard drinks of alcohol    Types: 3 Cans of beer per week   Drug use: No    Family History  Problem Relation Age of Onset   Pancreatic cancer Father 68   Ulcers Father    Stroke Father 19   Diabetes Father    Coronary artery disease Mother    Osteoporosis Mother    HIV Brother    Multiple sclerosis Sister    Heart attack Paternal Grandfather        early 36s   Heart attack Paternal Uncle        2 uncles in early 43s   Aortic aneurysm Brother    Colon cancer Neg  Hx    Esophageal cancer Neg Hx    Stomach cancer Neg Hx     Allergies  Allergen Reactions   Amlodipine Besy-Benazepril Hcl     REACTION: swelling of feet.  Probably  from Amlodipine component)   Lipitor [Atorvastatin Calcium]     ? Reaction, ? Swelling=- update 7/20.  Pt recalls his legs hurt and perhaps swelled slightly - JC    Medication list has been reviewed and updated.  Current Outpatient Medications on File Prior to Visit  Medication Sig Dispense Refill   acetaminophen (TYLENOL) 500 MG tablet Take 1,000 mg by mouth 2 (two) times daily as needed for moderate pain, fever or headache.     azithromycin (ZITHROMAX) 250 MG tablet Take 2 tablets on day 1, then 1 tablet daily on days 2 through 5 6 tablet 0   cholecalciferol (VITAMIN D) 1000 UNITS tablet Take 1,000 Units by mouth daily.     diazepam (VALIUM) 5 MG tablet TAKE 1/2 TO 1 TABLET BY MOUTH AT BEDTIME AS NEEDED FOR SLEEP 30 tablet 1   famotidine (PEPCID) 10 MG tablet Take 10 mg by mouth 2 (two) times daily as needed for heartburn or indigestion.     Fluticasone Propionate (FLONASE NA) Place into the nose as needed.     folic acid (FOLVITE) 400 MCG tablet Take 400 mcg by mouth daily.     ipratropium (ATROVENT) 0.03 % nasal spray Place 2 sprays into both nostrils 3 (three) times daily. Use as needed for nasal drip 30 mL 6   Krill Oil 500 MG CAPS Take 1,000 mg by mouth daily.     losartan-hydrochlorothiazide (HYZAAR) 100-12.5 MG tablet TAKE 1 TABLET BY MOUTH EVERY DAY 90 tablet 2   omeprazole (PRILOSEC) 40 MG capsule TAKE 1 CAPSULE BY MOUTH EVERY DAY AS NEEDED 90 capsule 1   predniSONE (DELTASONE) 20 MG tablet Take 40 mg ny mouth daily for 3 days, then 20 mg by mouth daily for 3 days 9 tablet 0   propranolol ER (INDERAL LA) 80 MG 24 hr capsule TAKE 1 CAPSULE BY MOUTH EVERY DAY 90 capsule 3   simvastatin (ZOCOR) 10 MG tablet TAKE 1 TABLET BY MOUTH EVERYDAY AT BEDTIME 90 tablet 3   warfarin (COUMADIN) 5 MG tablet TAKE 1/2 TABLET BY  MOUTH DAILY EXCEPT TAKE 1 TABLET ON TUESDAYS AND SATURDAYS OR AS DIRECTED BY ANTICOAGULATION CLINIC 90 tablet 1   No current facility-administered  medications on file prior to visit.    Review of Systems:  As per HPI- otherwise negative.   Physical Examination: There were no vitals filed for this visit. There were no vitals filed for this visit. There is no height or weight on file to calculate BMI. Ideal Body Weight:    GEN: no acute distress. HEENT: Atraumatic, Normocephalic.  Ears and Nose: No external deformity. CV: RRR, No M/G/R. No JVD. No thrill. No extra heart sounds. PULM: CTA B, no wheezes, crackles, rhonchi. No retractions. No resp. distress. No accessory muscle use. ABD: S, NT, ND, +BS. No rebound. No HSM. EXTR: No c/c/e PSYCH: Normally interactive. Conversant.    Assessment and Plan: ***  Signed Abbe Amsterdam, MD

## 2023-01-19 ENCOUNTER — Ambulatory Visit (INDEPENDENT_AMBULATORY_CARE_PROVIDER_SITE_OTHER): Payer: Medicare Other | Admitting: Family Medicine

## 2023-01-19 ENCOUNTER — Telehealth: Payer: Self-pay

## 2023-01-19 VITALS — BP 132/80 | HR 67 | Resp 18

## 2023-01-19 DIAGNOSIS — D6851 Activated protein C resistance: Secondary | ICD-10-CM | POA: Diagnosis not present

## 2023-01-19 DIAGNOSIS — R051 Acute cough: Secondary | ICD-10-CM

## 2023-01-19 LAB — POCT INR: INR: 3.9 — AB (ref 2.0–3.0)

## 2023-01-19 MED ORDER — MONTELUKAST SODIUM 10 MG PO TABS
10.0000 mg | ORAL_TABLET | Freq: Every day | ORAL | 3 refills | Status: DC
Start: 2023-01-19 — End: 2023-04-16

## 2023-01-19 NOTE — Telephone Encounter (Signed)
Per PCP, she saw pt today and pt was seen today in OV due to being placed on abx, azithromycin, and steroids. INR was checked at OV and INR was 3.9. PCP adjusted warfarin dosing and requested recheck INR in coumadin clinic next week. Per PCP, INR is above goal, asked him to hold today's dose and decrease tomorrow's dose to 2.5 mg, then go back to normal routine.    Contacted pt and scheduled for [facility] location on 6/11. Advised if any changes to contact coumadin clinic. Pt verbalized understanding.

## 2023-01-19 NOTE — Patient Instructions (Signed)
It was good to see you again today - I am sorry you are still not feeling great!  Let's try adding Singulair 10 mg once daily for your respiratory symptoms- please let me know how this seems to work for you  Your INR is high- likely due to using steroids and antibiotics, should be a temporary effect Do NOT take coumadin today Tomorrow/ Tuesday take a 1/2 tablet/ 2.5 mg increase of 5 mg.  Then go back to normal routine I will get in touch with Carollee Herter and get you in for an INR check next week   If any unusual bleeding or bruising please contact me!

## 2023-01-20 ENCOUNTER — Ambulatory Visit: Payer: Medicare Other

## 2023-01-21 ENCOUNTER — Other Ambulatory Visit: Payer: Self-pay | Admitting: Family Medicine

## 2023-01-21 DIAGNOSIS — I10 Essential (primary) hypertension: Secondary | ICD-10-CM

## 2023-01-27 ENCOUNTER — Ambulatory Visit (INDEPENDENT_AMBULATORY_CARE_PROVIDER_SITE_OTHER): Payer: Medicare Other

## 2023-01-27 DIAGNOSIS — Z7901 Long term (current) use of anticoagulants: Secondary | ICD-10-CM | POA: Diagnosis not present

## 2023-01-27 LAB — POCT INR: INR: 2.6 (ref 2.0–3.0)

## 2023-01-27 NOTE — Patient Instructions (Addendum)
Pre visit review using our clinic review tool, if applicable. No additional management support is needed unless otherwise documented below in the visit note.  Continue to take 1/2 tablet all days except 1 tablet on Tuesday and Saturdays.  Re-check in 8 weeks. 

## 2023-01-27 NOTE — Progress Notes (Signed)
Continue to take 1/2 tablet all days except 1 tablet on Tuesday and Saturdays.  Recheck in 8 weeks per pt request.  Sent msg to PCP for update on INR.

## 2023-01-28 ENCOUNTER — Ambulatory Visit: Payer: Medicare Other | Admitting: Family Medicine

## 2023-03-01 ENCOUNTER — Encounter: Payer: Self-pay | Admitting: Family Medicine

## 2023-03-01 DIAGNOSIS — G250 Essential tremor: Secondary | ICD-10-CM

## 2023-03-03 MED ORDER — PROPRANOLOL HCL ER 120 MG PO CP24
120.0000 mg | ORAL_CAPSULE | Freq: Every day | ORAL | 3 refills | Status: DC
Start: 2023-03-03 — End: 2023-04-03

## 2023-03-03 NOTE — Addendum Note (Signed)
Addended by: Abbe Amsterdam C on: 03/03/2023 01:11 AM   Modules accepted: Orders

## 2023-03-05 ENCOUNTER — Ambulatory Visit (HOSPITAL_COMMUNITY)
Admission: RE | Admit: 2023-03-05 | Discharge: 2023-03-05 | Disposition: A | Discharge status: Home / Self Care | Payer: Medicare Other | Source: Ambulatory Visit | Attending: Nurse Practitioner | Admitting: Nurse Practitioner

## 2023-03-05 ENCOUNTER — Encounter (HOSPITAL_COMMUNITY): Payer: Self-pay

## 2023-03-05 VITALS — BP 164/79 | HR 64 | Temp 98.2°F | Resp 16

## 2023-03-05 DIAGNOSIS — R238 Other skin changes: Secondary | ICD-10-CM

## 2023-03-05 DIAGNOSIS — T148XXA Other injury of unspecified body region, initial encounter: Secondary | ICD-10-CM

## 2023-03-05 MED ORDER — MUPIROCIN 2 % EX OINT: 1.0000 | TOPICAL_OINTMENT | Freq: Two times a day (BID) | CUTANEOUS | 0 refills | Status: DC

## 2023-03-05 NOTE — ED Provider Notes (Signed)
[facility]    CSN: [id number] Arrival date & time: 03/05/23  4696      History   Chief Complaint Chief Complaint  Patient presents with   Hand Problem    My doctor, Dr. Patsy Lager, is on vacation this week.  I have a raised area on my left hand that has become somewhat painful and swelling has occured including the collection of blood underneath the skin.  I'm Factor Five and on Coumadin. - Entered by patient    HPI Patrick Hatfield is a 88 y.o. male.   Patient presents today with a swollen area to the top of his left hand that has been slightly enlarging for the past couple of weeks.  Reports only tenderness with touch.  Reports initially, he did bumped his hand on a hard object.  Takes Coumadin for history of DVT and factor V Leiden.  He reports another area is developing at the base of his thumb on the same part of his hand and he hit that area on something hard as well.  No active drainage, fever, nausea/vomiting.  No warmth or redness.  He reports his skin is a little bit purple and bruised which is somewhat normal for him since he takes Coumadin.     Past Medical History:  Diagnosis Date   Anxiety    Arthritis    KNEES,ANKLES,HANDS   Asthma    AS A TEENAGER   Barrett's esophagus    Benign essential tremor    Clotting disorder (HCC)    FACTOR 5   Colitis in 20's   Colon polyps    Congenital deficiency of other clotting factors    factor 5   Depression 2006   drug overdose   Diverticulosis    DVT (deep venous thrombosis) (HCC) 1991 & 2010   X 2 in context Factor 5 def   Esophageal reflux    Factor 5 Leiden mutation, heterozygous (HCC)    Gilbert's syndrome    H/O ischemic bowel disease 2010   while off warfarin    Herpes zoster 2010   facial   Hiatal hernia    Hx of adenomatous colonic polyps 12/14/2014   Hyperlipidemia    Hypertension    Insomnia    Osteoporosis    Personal history of colonic polyps 05/28/2007   adenomatous   Proteinuria age 13    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/2013   Thrombocytopenia, unspecified (HCC) 01/05/2013   Gilbert syndrome 01/05/2013   Unspecified adverse effect of unspecified drug, medicinal and biological substance 01/09/2012   Barrett's esophagus 03/24/2011   Factor 5 Leiden mutation, heterozygous (HCC) 03/21/2011   Essential tremor 04/24/2009   Hyperlipidemia 10/03/2008   Essential hypertension 10/03/2008   GERD 10/03/2008   Fasting hyperglycemia 10/03/2008   DVT, HX OF 10/03/2008    Past Surgical History:  Procedure Laterality Date   CHOLECYSTECTOMY  05/16/2011    Dr Carolynne Edouard   COLONOSCOPY     Dr Jarold Motto   CYSTOSCOPY  Neg   EYE SURGERY Bilateral 10/16/2017   secondary catarct surgery.   KNEE ARTHROSCOPY Bilateral 2008, 2012   Dr.Aplington bilateral   TONSILLECTOMY     TRANSURETHRAL RESECTION OF PROSTATE      [facility];Dr Dahlstedt       Home Medications    Prior to Admission medications   Medication Sig Start Date End Date Taking? Authorizing Provider  mupirocin ointment (BACTROBAN) 2 % Apply 1 Application topically 2 (two) times daily. Apply topically twice daily to clean skin 03/05/23  Yes Cathlean Marseilles A, NP  acetaminophen (TYLENOL) 500 MG tablet Take 1,000 mg by mouth 2 (two) times daily as needed for moderate pain, fever or headache.    [provider]  cholecalciferol (VITAMIN D) 1000 UNITS tablet Take 1,000 Units by mouth daily.    [provider]  diazepam (VALIUM) 5 MG tablet TAKE 1/2 TO 1  TABLET BY MOUTH AT BEDTIME AS NEEDED FOR SLEEP 10/28/22   Copland, Gwenlyn Found, MD  famotidine (PEPCID) 10 MG tablet Take 10 mg by mouth 2 (two) times daily as needed for heartburn or indigestion.    [provider]  Fluticasone Propionate (FLONASE NA) Place into the nose as needed.    [provider]  folic acid (FOLVITE) 400 MCG tablet Take 400 mcg by mouth daily.    [provider]  ipratropium (ATROVENT) 0.03 % nasal spray Place 2 sprays into both nostrils 3 (three) times daily. Use as needed for nasal drip 07/14/22   Copland, Gwenlyn Found, MD  Krill Oil 500 MG CAPS Take 1,000 mg by mouth daily.    [provider]  losartan-hydrochlorothiazide (HYZAAR) 100-12.5 MG tablet Take 1 tablet by mouth daily. 01/21/23   Copland, Gwenlyn Found, MD  montelukast (SINGULAIR) 10 MG tablet Take 1 tablet (10 mg total) by mouth at bedtime. Use as needed for runny nose and cough symptoms 01/19/23   Copland, Gwenlyn Found, MD  omeprazole (PRILOSEC) 40 MG capsule TAKE 1 CAPSULE BY MOUTH EVERY DAY AS NEEDED 10/23/22   Copland, Gwenlyn Found, MD  predniSONE (DELTASONE) 20 MG tablet Take 40 mg ny mouth daily for 3 days, then 20 mg by mouth daily for 3 days 01/14/23   Copland, Gwenlyn Found, MD  propranolol ER (INDERAL LA) 120 MG 24 hr capsule Take 1 capsule (120 mg total) by mouth daily. 03/03/23   Copland, Gwenlyn Found, MD  simvastatin (ZOCOR) 10 MG tablet TAKE 1 TABLET BY MOUTH EVERYDAY AT BEDTIME 07/12/22   Copland, Gwenlyn Found, MD  warfarin (COUMADIN) 5 MG tablet TAKE 1/2 TABLET BY MOUTH DAILY EXCEPT TAKE 1 TABLET ON TUESDAYS AND SATURDAYS OR AS DIRECTED BY ANTICOAGULATION CLINIC 11/19/22   Copland, Gwenlyn Found, MD    Family History Family History  Problem Relation Age of Onset   Pancreatic cancer Father 40   Ulcers Father    Stroke Father 22   Diabetes Father    Coronary artery disease Mother    Osteoporosis Mother    HIV Brother    Multiple sclerosis Sister    Heart attack Paternal Grandfather         early 5s   Heart attack Paternal Uncle        2 uncles in early 26s   Aortic aneurysm Brother    Colon cancer Neg Hx    Esophageal cancer Neg Hx    Stomach cancer Neg Hx     Social History Social History   Tobacco Use   Smoking status: Former  Current packs/day: 0.00    Average packs/day: 2.0 packs/day for 40.0 years (80.0 ttl pk-yrs)    Types: Cigarettes    Start date: 08/18/1954    Quit date: 08/18/1994    Years since quitting: 28.5   Smokeless tobacco: Never   Tobacco comments:    smoked 1956-1996, up to 2 ppd  Vaping Use   Vaping status: Never Used  Substance Use Topics   Alcohol use: Yes    Alcohol/week: 3.0 standard drinks of alcohol    Types: 3 Cans of beer per week   Drug use: No     Allergies   Amlodipine besy-benazepril hcl and Lipitor [atorvastatin calcium]   Review of Systems Review of Systems Per HPI  Physical Exam Triage Vital Signs ED Triage Vitals [03/05/23 1010]  Encounter Vitals Group     BP (!) 164/79     Systolic BP Percentile      Diastolic BP Percentile      Pulse Rate 64     Resp 16     Temp 98.2 F (36.8 C)     Temp Source Oral     SpO2 95 %     Weight      Height      Head Circumference      Peak Flow      Pain Score 0     Pain Loc      Pain Education      Exclude from Growth Chart    No data found.  Updated Vital Signs BP (!) 164/79 (BP Location: Left Arm)   Pulse 64   Temp 98.2 F (36.8 C) (Oral)   Resp 16   SpO2 95%   Visual Acuity Right Eye Distance:   Left Eye Distance:   Bilateral Distance:    Right Eye Near:   Left Eye Near:    Bilateral Near:     Physical Exam Vitals and nursing note reviewed.  Constitutional:      General: He is not in acute distress.    Appearance: Normal appearance. He is not toxic-appearing.  Pulmonary:     Effort: Pulmonary effort is normal. No respiratory distress.  Musculoskeletal:       Hands:     Comments: Inspection: Diffuse bruising noted to the dorsal aspect of the  left hand; there are 2 hypopigmented areas noted without surrounding erythema.  No obvious deformity or swelling.   Palpation: lesions are tender to palpation, otherwise no tenderness; no warmth; no obvious deformities palpated ROM: Full ROM to left hand Strength: 5/5 left hand Neurovascular: neurovascularly intact in distal left upper extremity   Lesions as marked  Skin:    General: Skin is warm and dry.     Capillary Refill: Capillary refill takes less than 2 seconds.     Coloration: Skin is not jaundiced or pale.     Findings: No erythema.  Neurological:     Mental Status: He is alert and oriented to person, place, and time.  Psychiatric:        Behavior: Behavior is cooperative.      UC Treatments / Results  Labs (all labs ordered are listed, but only abnormal results are displayed) Labs Reviewed - No data to display  EKG   Radiology No results found.  Procedures Procedures (including critical care time)  Medications Ordered in UC Medications - No data to display  Initial Impression / Assessment and Plan / UC Course  I have reviewed the triage vital signs and  the nursing notes.  Pertinent labs & imaging results that were available during my care of the patient were reviewed by me and considered in my medical decision making (see chart for details).   Patient is well-appearing, afebrile, not tachycardic, not tachypneic, oxygenating well on room air.  Patient is mildly hypertensive in urgent care today.  Patient reports this is what his typical blood pressure numbers run at home and his primary care provider is aware.  1. Hematoma 2. Skin irritation Differentials include small superficial hematoma, ganglion cyst, possible small amount of superficial cellulitis Treat with warm/cool compresses, mupirocin ointment twice daily Follow-up with PCP for persistent/worsening symptoms despite treatment Reassurance provided-low suspicion for DVT given location and  anticoagulated without offending cause  The patient was given the opportunity to ask questions.  All questions answered to their satisfaction.  The patient is in agreement to this plan.    Final Clinical Impressions(s) / UC Diagnoses   Final diagnoses:  Hematoma  Skin irritation     Discharge Instructions      Please start using the topical Bactroban ointment to help treat any superficial infection in your skin.  I do not believe you have a blood clot in your hand today.  Start warm or cool compresses  Follow-up with PCP if symptoms persist or worsen despite treatment    ED Prescriptions     Medication Sig Dispense Auth. Provider   mupirocin ointment (BACTROBAN) 2 % Apply 1 Application topically 2 (two) times daily. Apply topically twice daily to clean skin 22 g Valentino Nose, NP      PDMP not reviewed this encounter.   Valentino Nose, NP 03/05/23 1126

## 2023-03-05 NOTE — ED Triage Notes (Signed)
Pt states the top of his left hand is red and swollen for the past week. States he is has factor five and is on coumadin.

## 2023-03-05 NOTE — Discharge Instructions (Signed)
Please start using the topical Bactroban ointment to help treat any superficial infection in your skin.  I do not believe you have a blood clot in your hand today.  Start warm or cool compresses  Follow-up with PCP if symptoms persist or worsen despite treatment

## 2023-03-17 ENCOUNTER — Encounter: Payer: Self-pay | Admitting: Family Medicine

## 2023-03-17 DIAGNOSIS — G250 Essential tremor: Secondary | ICD-10-CM

## 2023-03-18 MED ORDER — PRIMIDONE 50 MG PO TABS
25.0000 mg | ORAL_TABLET | Freq: Every day | ORAL | 2 refills | Status: DC
Start: 2023-03-18 — End: 2023-04-21

## 2023-03-18 NOTE — Addendum Note (Signed)
Addended by: Pearline Cables on: 03/18/2023 08:43 AM   Modules accepted: Orders

## 2023-03-20 ENCOUNTER — Encounter: Payer: Self-pay | Admitting: Family Medicine

## 2023-03-20 DIAGNOSIS — G250 Essential tremor: Secondary | ICD-10-CM

## 2023-03-20 NOTE — Telephone Encounter (Signed)
Medication was sent on 03/18/23- will call to see what happened.    primidone (MYSOLINE) 50 MG tablet [[id number]]   Order Details Dose: 25 mg Route: Oral Frequency: Daily at bedtime  Dispense Quantity: 30 tablet Refills: 2        Sig: Take 0.5 tablets (25 mg total) by mouth at bedtime. For tremor       Start Date: 03/18/23 End Date: --  Written Date: 03/18/23 Expiration Date: 03/17/24     Associated Diagnoses: Essential tremor [G25.0]  Providers  Authorizing Provider: Pearline Cables, MD [address], [place] [address] Phone: [phone]   Fax: [phone] DEA #: [id number]   NPI: [id number]      Ordering User: Pearline Cables, MD      Pharmacy  [id number] [place] [address], [facility] - [phone] [place], [place] [address] Phone: [phone]  Fax: [phone]

## 2023-03-24 ENCOUNTER — Ambulatory Visit (INDEPENDENT_AMBULATORY_CARE_PROVIDER_SITE_OTHER): Payer: Medicare Other

## 2023-03-24 DIAGNOSIS — Z7901 Long term (current) use of anticoagulants: Secondary | ICD-10-CM

## 2023-03-24 LAB — POCT INR: INR: 3.6 — AB (ref 2.0–3.0)

## 2023-03-24 NOTE — Patient Instructions (Addendum)
Pre visit review using our clinic review tool, if applicable. No additional management support is needed unless otherwise documented below in the visit note.  Hold dose today and then change weekly dose to take 1/2 tablet all days except 1 tablet on Saturdays. Recheck in 2 weeks.

## 2023-03-24 NOTE — Progress Notes (Signed)
Pt started primidone 3 days ago and has been taking prilosec for the last 3 months.  Hold dose today and then change weekly dose to take 1/2 tablet all days except 1 tablet on Saturdays. Recheck in 2 weeks.

## 2023-03-29 ENCOUNTER — Encounter: Payer: Self-pay | Admitting: Family Medicine

## 2023-03-29 DIAGNOSIS — M545 Low back pain, unspecified: Secondary | ICD-10-CM

## 2023-03-30 MED ORDER — ACETAMINOPHEN-CODEINE 300-30 MG PO TABS
1.0000 | ORAL_TABLET | Freq: Three times a day (TID) | ORAL | 0 refills | Status: DC | PRN
Start: 2023-03-30 — End: 2023-06-10

## 2023-03-30 NOTE — Addendum Note (Signed)
Addended by: Abbe Amsterdam C on: 03/30/2023 10:32 AM   Modules accepted: Orders

## 2023-03-30 NOTE — Telephone Encounter (Signed)
Please see the MyChart message reply(ies) for my assessment and plan.  The patient gave consent for this Medical Advice Message and is aware that it may result in a bill to their insurance company as well as the possibility that this may result in a co-payment or deductible. They are an established patient, but are not seeking medical advice exclusively about a problem treated during an in person or video visit in the last 7 days. I did not recommend an in person or video visit within 7 days of my reply.  I spent a total of 15 she was there she should back in a second think minutes cumulative time within 7 days through MyChart messaging Abbe Amsterdam, MD

## 2023-04-02 ENCOUNTER — Other Ambulatory Visit: Payer: Self-pay | Admitting: Family Medicine

## 2023-04-02 ENCOUNTER — Encounter: Payer: Self-pay | Admitting: Family Medicine

## 2023-04-02 DIAGNOSIS — R350 Frequency of micturition: Secondary | ICD-10-CM

## 2023-04-02 NOTE — Progress Notes (Signed)
Lab Results  Component Value Date   HGBA1C 5.2 03/02/2019

## 2023-04-03 ENCOUNTER — Encounter: Payer: Self-pay | Admitting: Physician Assistant

## 2023-04-03 ENCOUNTER — Ambulatory Visit (INDEPENDENT_AMBULATORY_CARE_PROVIDER_SITE_OTHER): Payer: Medicare Other | Admitting: Physician Assistant

## 2023-04-03 VITALS — BP 130/75 | HR 77 | Temp 97.9°F | Ht 70.0 in | Wt 216.4 lb

## 2023-04-03 DIAGNOSIS — R3911 Hesitancy of micturition: Secondary | ICD-10-CM | POA: Diagnosis not present

## 2023-04-03 DIAGNOSIS — R350 Frequency of micturition: Secondary | ICD-10-CM

## 2023-04-03 DIAGNOSIS — M5459 Other low back pain: Secondary | ICD-10-CM | POA: Diagnosis not present

## 2023-04-03 DIAGNOSIS — M791 Myalgia, unspecified site: Secondary | ICD-10-CM | POA: Diagnosis not present

## 2023-04-03 LAB — POCT URINALYSIS DIP (MANUAL ENTRY)
Bilirubin, UA: NEGATIVE
Blood, UA: NEGATIVE
Glucose, UA: NEGATIVE mg/dL
Ketones, POC UA: NEGATIVE mg/dL
Leukocytes, UA: NEGATIVE
Nitrite, UA: NEGATIVE
Protein Ur, POC: NEGATIVE mg/dL
Spec Grav, UA: 1.01 (ref 1.010–1.025)
Urobilinogen, UA: 0.2 E.U./dL
pH, UA: 6 (ref 5.0–8.0)

## 2023-04-03 NOTE — Progress Notes (Signed)
Established patient visit   Patient: Patrick Hatfield   DOB: 03-Sep-1934   88 y.o. Male  MRN: [id number] Visit Date: 04/03/2023  Today's healthcare provider: Alfredia Ferguson, PA-C   Cc. Urinary frequency  Subjective    HPI  Pt reports for the last three weeks, frequent urination 15-20 times a day, multiple times an hour. Reports it takes some time for his stream to start, and it is weak. Denies hematuria, dysuria. Denies abdominal pain. Reports chronic back pain managed by ortho. Upcoming MRI. Multiple times an hour, 15-20 times a day    Medications: Outpatient Medications Prior to Visit  Medication Sig   acetaminophen (TYLENOL) 500 MG tablet Take 1,000 mg by mouth 2 (two) times daily as needed for moderate pain, fever or headache.   acetaminophen-codeine (TYLENOL #3) 300-30 MG tablet Take 1-2 tablets by mouth every 8 (eight) hours as needed for moderate pain.   cholecalciferol (VITAMIN D) 1000 UNITS tablet Take 1,000 Units by mouth daily.   diazepam (VALIUM) 5 MG tablet TAKE 1/2 TO 1 TABLET BY MOUTH AT BEDTIME AS NEEDED FOR SLEEP   Fluticasone Propionate (FLONASE NA) Place into the nose as needed.   folic acid (FOLVITE) 400 MCG tablet Take 400 mcg by mouth daily.   ipratropium (ATROVENT) 0.03 % nasal spray Place 2 sprays into both nostrils 3 (three) times daily. Use as needed for nasal drip   Krill Oil 500 MG CAPS Take 1,000 mg by mouth daily.   losartan-hydrochlorothiazide (HYZAAR) 100-12.5 MG tablet Take 1 tablet by mouth daily.   montelukast (SINGULAIR) 10 MG tablet Take 1 tablet (10 mg total) by mouth at bedtime. Use as needed for runny nose and cough symptoms   mupirocin ointment (BACTROBAN) 2 % Apply 1 Application topically 2 (two) times daily. Apply topically twice daily to clean skin   omeprazole (PRILOSEC) 40 MG capsule TAKE 1 CAPSULE BY MOUTH EVERY DAY AS NEEDED   primidone (MYSOLINE) 50 MG tablet Take 0.5 tablets (25 mg total) by mouth at bedtime. For tremor    simvastatin (ZOCOR) 10 MG tablet TAKE 1 TABLET BY MOUTH EVERYDAY AT BEDTIME   warfarin (COUMADIN) 5 MG tablet TAKE 1/2 TABLET BY MOUTH DAILY EXCEPT TAKE 1 TABLET ON TUESDAYS AND SATURDAYS OR AS DIRECTED BY ANTICOAGULATION CLINIC   [DISCONTINUED] famotidine (PEPCID) 10 MG tablet Take 10 mg by mouth 2 (two) times daily as needed for heartburn or indigestion.   [DISCONTINUED] propranolol ER (INDERAL LA) 120 MG 24 hr capsule Take 1 capsule (120 mg total) by mouth daily.   No facility-administered medications prior to visit.    Review of Systems  Constitutional:  Negative for fatigue and fever.  Respiratory:  Negative for cough and shortness of breath.   Cardiovascular:  Negative for chest pain, palpitations and leg swelling.  Genitourinary:  Positive for frequency.  Neurological:  Negative for dizziness and headaches.      Objective    BP 130/75   Pulse 77   Temp 97.9 F (36.6 C)   Ht 5\' 10"  (1.778 m)   Wt 216 lb 6.4 oz (98.2 kg)   SpO2 95%   BMI 31.05 kg/m   Physical Exam Constitutional:      General: He is awake.     Appearance: He is well-developed.  HENT:     Head: Normocephalic.  Eyes:     Conjunctiva/sclera: Conjunctivae normal.  Cardiovascular:     Rate and Rhythm: Normal rate and regular rhythm.  Heart sounds: Normal heart sounds.  Pulmonary:     Effort: Pulmonary effort is normal.     Breath sounds: Normal breath sounds.  Skin:    General: Skin is warm.  Neurological:     Mental Status: He is alert and oriented to person, place, and time.  Psychiatric:        Attention and Perception: Attention normal.        Mood and Affect: Mood normal.        Speech: Speech normal.        Behavior: Behavior is cooperative.      Results for orders placed or performed in visit on 04/03/23  POCT urinalysis dipstick  Result Value Ref Range   Color, UA yellow yellow   Clarity, UA clear clear   Glucose, UA negative negative mg/dL   Bilirubin, UA negative negative    Ketones, POC UA negative negative mg/dL   Spec Grav, UA 0.865 7.846 - 1.025   Blood, UA negative negative   pH, UA 6.0 5.0 - 8.0   Protein Ur, POC negative negative mg/dL   Urobilinogen, UA 0.2 0.2 or 1.0 E.U./dL   Nitrite, UA Negative Negative   Leukocytes, UA Negative Negative    Assessment & Plan     1. Urinary frequency 2. Urinary hesitancy UA negative. Ordering culture Advised to check psa, cmp. Will follow up with labs; if culture negative, labs stable then recommend ref to urology.  - POCT urinalysis dipstick - Urine Culture - PSA - Comp Met (CMET)  Return if symptoms worsen or fail to improve.      I, Alfredia Ferguson, PA-C have reviewed all documentation for this visit. The documentation on  04/03/23   for the exam, diagnosis, procedures, and orders are all accurate and complete.    Alfredia Ferguson, PA-C  [facility] [phone] (phone) [phone] (fax)  [facility]

## 2023-04-04 LAB — COMPREHENSIVE METABOLIC PANEL
AG Ratio: 1.4 (calc) (ref 1.0–2.5)
ALT: 34 U/L (ref 9–46)
AST: 25 U/L (ref 10–35)
Albumin: 4.5 g/dL (ref 3.6–5.1)
Alkaline phosphatase (APISO): 59 U/L (ref 35–144)
BUN: 13 mg/dL (ref 7–25)
CO2: 25 mmol/L (ref 20–32)
Calcium: 9.8 mg/dL (ref 8.6–10.3)
Chloride: 101 mmol/L (ref 98–110)
Creat: 0.96 mg/dL (ref 0.70–1.22)
Globulin: 3.2 g/dL (ref 1.9–3.7)
Glucose, Bld: 131 mg/dL — ABNORMAL HIGH (ref 65–99)
Potassium: 4 mmol/L (ref 3.5–5.3)
Sodium: 137 mmol/L (ref 135–146)
Total Bilirubin: 2.2 mg/dL — ABNORMAL HIGH (ref 0.2–1.2)
Total Protein: 7.7 g/dL (ref 6.1–8.1)

## 2023-04-04 LAB — URINE CULTURE
MICRO NUMBER:: 15341997
Result:: NO GROWTH
SPECIMEN QUALITY:: ADEQUATE

## 2023-04-04 LAB — PSA: PSA: 3.05 ng/mL (ref <=4.00)

## 2023-04-07 ENCOUNTER — Ambulatory Visit (INDEPENDENT_AMBULATORY_CARE_PROVIDER_SITE_OTHER): Payer: Medicare Other

## 2023-04-07 DIAGNOSIS — Z7901 Long term (current) use of anticoagulants: Secondary | ICD-10-CM

## 2023-04-07 LAB — POCT INR: INR: 2 (ref 2.0–3.0)

## 2023-04-07 NOTE — Progress Notes (Signed)
Pt started primidone 3 days ago and has been taking prilosec for the last 3 months.  Pt prescribed prednisone for 7 days. Continue 1/2 tablet all days except 1 tablet on Saturdays. Recheck in 4 weeks.

## 2023-04-07 NOTE — Patient Instructions (Addendum)
Pre visit review using our clinic review tool, if applicable. No additional management support is needed unless otherwise documented below in the visit note.  Continue 1/2 tablet all days except 1 tablet on Saturdays. Recheck in 4 weeks.

## 2023-04-09 NOTE — Addendum Note (Signed)
Addended by: Pearline Cables on: 04/09/2023 06:07 PM   Modules accepted: Orders

## 2023-04-16 ENCOUNTER — Other Ambulatory Visit: Payer: Self-pay | Admitting: Family Medicine

## 2023-04-16 ENCOUNTER — Ambulatory Visit: Payer: Medicare Other | Admitting: Urology

## 2023-04-16 ENCOUNTER — Encounter: Payer: Self-pay | Admitting: Urology

## 2023-04-16 VITALS — BP 133/75 | HR 97 | Ht 70.0 in | Wt 207.0 lb

## 2023-04-16 DIAGNOSIS — R399 Unspecified symptoms and signs involving the genitourinary system: Secondary | ICD-10-CM | POA: Diagnosis not present

## 2023-04-16 DIAGNOSIS — N401 Enlarged prostate with lower urinary tract symptoms: Secondary | ICD-10-CM | POA: Diagnosis not present

## 2023-04-16 DIAGNOSIS — N3281 Overactive bladder: Secondary | ICD-10-CM

## 2023-04-16 DIAGNOSIS — R350 Frequency of micturition: Secondary | ICD-10-CM | POA: Diagnosis not present

## 2023-04-16 DIAGNOSIS — R051 Acute cough: Secondary | ICD-10-CM

## 2023-04-16 LAB — BLADDER SCAN AMB NON-IMAGING

## 2023-04-16 MED ORDER — GEMTESA 75 MG PO TABS: 75.0000 mg | ORAL_TABLET | Freq: Every day | ORAL | 5 refills | Status: DC

## 2023-04-16 MED ORDER — FINASTERIDE 5 MG PO TABS: 5.0000 mg | ORAL_TABLET | Freq: Every day | ORAL | 3 refills | Status: DC

## 2023-04-16 NOTE — Progress Notes (Signed)
Assessment: 1. Benign prostatic hyperplasia with urinary frequency   2. Lower urinary tract symptoms (LUTS)   3. OAB (overactive bladder)      Plan: Today had a long discussion with the patient regarding his BPH/lower urinary tract symptoms.  Most bothersome issues are urinary frequency and urgency. We discussed medical management options.  Patient would like to begin new medical therapy. Rx: Finasteride 5 mg daily Rx: Gemtesa 75 mg daily-samples provided Rationale as well as nature of medications including potential adverse events and side effects reviewed in detail. Patient will follow-up in 5 to 6 weeks for symptom assessment  Chief Complaint: LUTS  History of Present Illness:  Patrick Hatfield is a 88 y.o. male with past medical history as listed below including need for long-term anticoagulation with Coumadin who is seen in consultation from Copland, Gwenlyn Found, MD for evaluation of LUTS.  Patient has been followed for years by Dr. Retta Diones in [place].  He performed a TURP on the patient approximately 20 years ago with excellent results until the last year or so.  Over the last year or so the patient has had bothersome urinary frequency and urgency-no UI.  He reports a good force of stream and and feels like he empties. IPSS = 15 PVR = 3 mL  Patient previously seen by Dr. Retta Diones January 2023 for a rising PSA to 2.56.  He discussed with him that PSA is typically not recommended at his age. PSA 03/2023 = 3.05  Past Medical History:  Past Medical History:  Diagnosis Date   Anxiety    Arthritis    KNEES,ANKLES,HANDS   Asthma    AS A TEENAGER   Barrett's esophagus    Benign essential tremor    Clotting disorder (HCC)    FACTOR 5   Colitis in 20's   Colon polyps    Congenital deficiency of other clotting factors    factor 5   Depression 2006   drug overdose   Diverticulosis    DVT (deep venous thrombosis) (HCC) 1991 & 2010   X 2 in context Factor 5 def    Esophageal reflux    Factor 5 Leiden mutation, heterozygous (HCC)    Gilbert's syndrome    H/O ischemic bowel disease 2010   while off warfarin    Herpes zoster 2010   facial   Hiatal hernia    Hx of adenomatous colonic polyps 12/14/2014   Hyperlipidemia    Hypertension    Insomnia    Osteoporosis    Personal history of colonic polyps 05/28/2007   adenomatous   Proteinuria age 41   Thrombosis of mesenteric vein (HCC) 07/12/2013   03/2009   Umbilical hernia     Past Surgical History:  Past Surgical History:  Procedure Laterality Date   CHOLECYSTECTOMY  05/16/2011    Dr Carolynne Edouard   COLONOSCOPY     Dr Jarold Motto   CYSTOSCOPY     Neg   EYE SURGERY Bilateral 10/16/2017   secondary catarct surgery.   KNEE ARTHROSCOPY Bilateral 2008, 2012   Dr.Aplington bilateral   TONSILLECTOMY     TRANSURETHRAL RESECTION OF PROSTATE      BPH;Dr Dahlstedt    Allergies:  Allergies  Allergen Reactions   Amlodipine Besy-Benazepril Hcl     REACTION: swelling of feet.  Probably  from Amlodipine component)   Lipitor [Atorvastatin Calcium]     ? Reaction, ? Swelling=- update 7/20.  Pt recalls his legs hurt and perhaps swelled slightly - JC  Family History:  Family History  Problem Relation Age of Onset   Pancreatic cancer Father 51   Ulcers Father    Stroke Father 9   Diabetes Father    Coronary artery disease Mother    Osteoporosis Mother    HIV Brother    Multiple sclerosis Sister    Heart attack Paternal Grandfather        early 46s   Heart attack Paternal Uncle        2 uncles in early 66s   Aortic aneurysm Brother    Colon cancer Neg Hx    Esophageal cancer Neg Hx    Stomach cancer Neg Hx     Social History:  Social History   Tobacco Use   Smoking status: Former    Current packs/day: 0.00    Average packs/day: 2.0 packs/day for 40.0 years (80.0 ttl pk-yrs)    Types: Cigarettes    Start date: 08/18/1954    Quit date: 08/18/1994    Years since quitting: 28.6   Smokeless  tobacco: Never   Tobacco comments:    smoked 1956-1996, up to 2 ppd  Vaping Use   Vaping status: Never Used  Substance Use Topics   Alcohol use: Yes    Alcohol/week: 3.0 standard drinks of alcohol    Types: 3 Cans of beer per week   Drug use: No    Review of symptoms:  Constitutional:  Negative for unexplained weight loss, night sweats, fever, chills ENT:  Negative for nose bleeds, sinus pain, painful swallowing CV:  Negative for chest pain, shortness of breath, exercise intolerance, palpitations, loss of consciousness Resp:  Negative for cough, wheezing, shortness of breath GI:  Negative for nausea, vomiting, diarrhea, bloody stools GU:  Positives noted in HPI; otherwise negative for gross hematuria, dysuria, urinary incontinence Neuro:  Negative for seizures, poor balance, limb weakness, slurred speech Psych:  Negative for lack of energy, depression, anxiety Endocrine:  Negative for polydipsia, polyuria, symptoms of hypoglycemia (dizziness, hunger, sweating) Hematologic:  Negative for anemia, purpura, petechia, prolonged or excessive bleeding, use of anticoagulants  Allergic:  Negative for difficulty breathing or choking as a result of exposure to anything; no shellfish allergy; no allergic response (rash/itch) to materials, foods  Physical exam: BP 133/75   Pulse 97   Ht 5\' 10"  (1.778 m)   Wt 207 lb (93.9 kg)   BMI 29.70 kg/m  GENERAL APPEARANCE:  Well appearing, well developed, well nourished, NAD  GU: Normal external genitalia DRE: Normal sphincter tone; prostate is approximately 40 g without evidence of nodules or induration  Results: Results for orders placed or performed in visit on 04/16/23 (from the past 24 hour(s))  BLADDER SCAN AMB NON-IMAGING   Collection Time: 04/16/23 12:00 AM  Result Value Ref Range   Scan Result 3ml

## 2023-04-20 ENCOUNTER — Other Ambulatory Visit: Payer: Self-pay | Admitting: Family Medicine

## 2023-04-20 DIAGNOSIS — M62838 Other muscle spasm: Secondary | ICD-10-CM

## 2023-04-21 ENCOUNTER — Encounter: Payer: Self-pay | Admitting: Family Medicine

## 2023-04-21 DIAGNOSIS — G250 Essential tremor: Secondary | ICD-10-CM

## 2023-04-21 DIAGNOSIS — M5459 Other low back pain: Secondary | ICD-10-CM | POA: Diagnosis not present

## 2023-04-21 DIAGNOSIS — M533 Sacrococcygeal disorders, not elsewhere classified: Secondary | ICD-10-CM | POA: Diagnosis not present

## 2023-04-21 MED ORDER — PRIMIDONE 50 MG PO TABS
100.0000 mg | ORAL_TABLET | Freq: Every day | ORAL | 2 refills | Status: DC
Start: 2023-04-21 — End: 2024-02-09

## 2023-04-21 NOTE — Addendum Note (Signed)
Addended by: Abbe Amsterdam C on: 04/21/2023 02:15 PM   Modules accepted: Orders

## 2023-04-24 LAB — URINALYSIS, ROUTINE W REFLEX MICROSCOPIC
Bilirubin, UA: NEGATIVE
Glucose, UA: NEGATIVE
Ketones, UA: NEGATIVE
Leukocytes,UA: NEGATIVE
Nitrite, UA: NEGATIVE
Protein,UA: NEGATIVE
Specific Gravity, UA: 1.02 (ref 1.005–1.030)
Urobilinogen, Ur: 0.2 mg/dL (ref 0.2–1.0)
pH, UA: 6 (ref 5.0–7.5)

## 2023-04-24 LAB — MICROSCOPIC EXAMINATION

## 2023-05-05 ENCOUNTER — Telehealth: Payer: Self-pay

## 2023-05-05 ENCOUNTER — Encounter: Payer: Self-pay | Admitting: Urology

## 2023-05-05 ENCOUNTER — Ambulatory Visit (INDEPENDENT_AMBULATORY_CARE_PROVIDER_SITE_OTHER): Payer: Medicare Other

## 2023-05-05 DIAGNOSIS — Z7901 Long term (current) use of anticoagulants: Secondary | ICD-10-CM

## 2023-05-05 DIAGNOSIS — Z23 Encounter for immunization: Secondary | ICD-10-CM

## 2023-05-05 LAB — POCT INR: INR: 2.1 (ref 2.0–3.0)

## 2023-05-05 NOTE — Patient Instructions (Addendum)
Pre visit review using our clinic review tool, if applicable. No additional management support is needed unless otherwise documented below in the visit note.  Continue 1/2 tablet all days except 1 tablet on Saturdays. Recheck in 2 weeks.

## 2023-05-05 NOTE — Telephone Encounter (Signed)
Pt in today for INR check, result was 2.1  Pt reported primidone dose increased to 100 mg every day, orthopedist placed pt on prednisone 10 mg BID, long-term. Pt missed dose last night.  Added prednisone to medication list. Due to all of these factors, will recheck INR in 2 weeks.    Added prednisone to medication list.   Pt also received high dose flu vaccine today.

## 2023-05-05 NOTE — Progress Notes (Signed)
Primidone dose increased to 100 mg every day, orthopedist placed pt on prednisone 10 mg BID. Pt missed dose last night.  Added prednisone to medication list. Due to all of these factors, will recheck INR in 2 weeks.  Pt requested a msg be sent to PCP concerning INR result today. Sent PCP msg with information.   Continue 1/2 tablet all days except 1 tablet on Saturdays. Recheck in 2 weeks.  Pt requested flu vaccine. Administered high dose flu vaccine. Pt tolerated well.

## 2023-05-07 ENCOUNTER — Encounter: Payer: Self-pay | Admitting: Family Medicine

## 2023-05-07 DIAGNOSIS — G250 Essential tremor: Secondary | ICD-10-CM

## 2023-05-07 NOTE — Addendum Note (Signed)
Addended by: Abbe Amsterdam C on: 05/07/2023 08:39 PM   Modules accepted: Orders

## 2023-05-12 ENCOUNTER — Encounter: Payer: Self-pay | Admitting: Diagnostic Neuroimaging

## 2023-05-12 ENCOUNTER — Ambulatory Visit: Payer: Medicare Other | Admitting: Diagnostic Neuroimaging

## 2023-05-12 VITALS — BP 135/77 | HR 81 | Ht 70.0 in | Wt 206.0 lb

## 2023-05-12 DIAGNOSIS — G250 Essential tremor: Secondary | ICD-10-CM

## 2023-05-12 MED ORDER — PROPRANOLOL HCL ER 60 MG PO CP24: 60.0000 mg | ORAL_CAPSULE | Freq: Every day | ORAL | 12 refills | Status: DC

## 2023-05-12 NOTE — Progress Notes (Signed)
[facility]  PATIENT: Patrick Hatfield DOB: [birth year]  REFERRING CLINICIAN: Copland, Gwenlyn Found, MD HISTORY FROM: patient  REASON FOR VISIT: new consult   HISTORICAL  CHIEF COMPLAINT:  Chief Complaint  Patient presents with   New Patient (Initial Visit)    Patient in room #6 with his wife. Patient states his tremors and balance his been getting worse.    HISTORY OF PRESENT ILLNESS:   88 year old male here for evaluation of tremors.  Symptoms started around age 73 years old.  Noticed tremors mainly in his hands and his head.  Has family history of tremor in his younger brother.  Has tried propranolol initially which slightly helped.  This was changed to primidone more recently.  Also has some issues with mild balance and gait difficulty.   REVIEW OF SYSTEMS: Full 14 system review of systems performed and negative with exception of: as per HPI.  ALLERGIES: Allergies  Allergen Reactions   Amlodipine Besy-Benazepril Hcl     REACTION: swelling of feet.  Probably  from Amlodipine component)   Lipitor [Atorvastatin Calcium]     ? Reaction, ? Swelling=- update 7/20.  Pt recalls his legs hurt and perhaps swelled slightly - JC    HOME MEDICATIONS: Outpatient Medications Prior to Visit  Medication Sig Dispense Refill   acetaminophen (TYLENOL) 500 MG tablet Take 1,000 mg by mouth 2 (two) times daily as needed for moderate pain, fever or headache.     acetaminophen-codeine (TYLENOL #3) 300-30 MG tablet Take 1-2 tablets by mouth every 8 (eight) hours as needed for moderate pain. 20 tablet 0   cholecalciferol (VITAMIN D) 1000 UNITS tablet Take 1,000 Units by mouth daily.     diazepam (VALIUM) 5 MG tablet TAKE 1/2 TO 1 TABLET BY MOUTH AT BEDTIME AS NEEDED FOR SLEEP 30 tablet 1   finasteride (PROSCAR) 5 MG tablet Take 1 tablet (5 mg total) by mouth daily. 90 tablet 3   Fluticasone Propionate (FLONASE NA) Place into the nose as needed.     folic acid (FOLVITE) 400 MCG  tablet Take 400 mcg by mouth daily.     ipratropium (ATROVENT) 0.03 % nasal spray Place 2 sprays into both nostrils 3 (three) times daily. Use as needed for nasal drip 30 mL 6   Krill Oil 500 MG CAPS Take 1,000 mg by mouth daily.     losartan-hydrochlorothiazide (HYZAAR) 100-12.5 MG tablet Take 1 tablet by mouth daily. 90 tablet 1   omeprazole (PRILOSEC) 40 MG capsule TAKE 1 CAPSULE BY MOUTH EVERY DAY AS NEEDED 90 capsule 1   predniSONE (DELTASONE) 10 MG tablet Take 10 mg by mouth 2 (two) times daily with a meal.     primidone (MYSOLINE) 50 MG tablet Take 2 tablets (100 mg total) by mouth at bedtime. For tremor 180 tablet 2   simvastatin (ZOCOR) 10 MG tablet TAKE 1 TABLET BY MOUTH EVERYDAY AT BEDTIME 90 tablet 3   Vibegron (GEMTESA) 75 MG TABS Take 1 tablet (75 mg total) by mouth daily. 30 tablet 5   warfarin (COUMADIN) 5 MG tablet TAKE 1/2 TABLET BY MOUTH DAILY EXCEPT TAKE 1 TABLET ON TUESDAYS AND SATURDAYS OR AS DIRECTED BY ANTICOAGULATION CLINIC 90 tablet 1   montelukast (SINGULAIR) 10 MG tablet TAKE 1 TABLET (10 MG TOTAL) BY MOUTH AT BEDTIME. USE AS NEEDED FOR RUNNY NOSE AND COUGH SYMPTOMS 90 tablet 1   mupirocin ointment (BACTROBAN) 2 % Apply 1 Application topically 2 (two) times daily. Apply topically twice daily to  clean skin 22 g 0   No facility-administered medications prior to visit.    PAST MEDICAL HISTORY: Past Medical History:  Diagnosis Date   Anxiety    Arthritis    KNEES,ANKLES,HANDS   Asthma    AS A TEENAGER   Barrett's esophagus    Benign essential tremor    Clotting disorder (HCC)    FACTOR 5   Colitis in 20's   Colon polyps    Congenital deficiency of other clotting factors    factor 5   Depression 2006   drug overdose   Diverticulosis    DVT (deep venous thrombosis) (HCC) 1991 & 2010   X 2 in context Factor 5 def   Esophageal reflux    Factor 5 Leiden mutation, heterozygous (HCC)    Gilbert's syndrome    H/O ischemic bowel disease 2010   while off  warfarin    Herpes zoster 2010   facial   Hiatal hernia    Hx of adenomatous colonic polyps 12/14/2014   Hyperlipidemia    Hypertension    Insomnia    Osteoporosis    Personal history of colonic polyps 05/28/2007   adenomatous   Proteinuria age 74   Thrombosis of mesenteric vein (HCC) 07/12/2013   03/2009   Umbilical hernia     PAST SURGICAL HISTORY: Past Surgical History:  Procedure Laterality Date   CHOLECYSTECTOMY  05/16/2011    Dr Carolynne Edouard   COLONOSCOPY     Dr Jarold Motto   CYSTOSCOPY     Neg   EYE SURGERY Bilateral 10/16/2017   secondary catarct surgery.   KNEE ARTHROSCOPY Bilateral 2008, 2012   Dr.Aplington bilateral   TONSILLECTOMY     TRANSURETHRAL RESECTION OF PROSTATE      BPH;Dr Dahlstedt    FAMILY HISTORY: Family History  Problem Relation Age of Onset   Pancreatic cancer Father 77   Ulcers Father    Stroke Father 51   Diabetes Father    Coronary artery disease Mother    Osteoporosis Mother    HIV Brother    Multiple sclerosis Sister    Heart attack Paternal Grandfather        early 47s   Heart attack Paternal Uncle        2 uncles in early 47s   Aortic aneurysm Brother    Colon cancer Neg Hx    Esophageal cancer Neg Hx    Stomach cancer Neg Hx     SOCIAL HISTORY: Social History   Socioeconomic History   Marital status: Married    Spouse name: Not on file   Number of children: 4   Years of education: Not on file   Highest education level: Some college, no degree  Occupational History   Occupation: Retired    Associate Professor: RETIRED  Tobacco Use   Smoking status: Former    Current packs/day: 0.00    Average packs/day: 2.0 packs/day for 40.0 years (80.0 ttl pk-yrs)    Types: Cigarettes    Start date: 08/18/1954    Quit date: 08/18/1994    Years since quitting: 28.7   Smokeless tobacco: Never   Tobacco comments:    smoked 1956-1996, up to 2 ppd  Vaping Use   Vaping status: Never Used  Substance and Sexual Activity   Alcohol use: Yes     Alcohol/week: 3.0 standard drinks of alcohol    Types: 3 Cans of beer per week   Drug use: No   Sexual activity: Not Currently  Birth control/protection: None    Comment: Married  Other Topics Concern   Not on file  Social History Narrative   Married, lives with spouse   Married x56 years   4 daughters   4 grandchildren   Retired - Psychologist, sport and exercise, sales/technology   Social Determinants of Health   Financial Resource Strain: Low Risk  (01/13/2023)   Overall Financial Resource Strain (CARDIA)    Difficulty of Paying Living Expenses: Not very hard  Food Insecurity: No Food Insecurity (01/13/2023)   Hunger Vital Sign    Worried About Running Out of Food in the Last Year: Never true    Ran Out of Food in the Last Year: Never true  Transportation Needs: No Transportation Needs (01/13/2023)   PRAPARE - Administrator, Civil Service (Medical): No    Lack of Transportation (Non-Medical): No  Physical Activity: Insufficiently Active (01/13/2023)   Exercise Vital Sign    Days of Exercise per Week: 3 days    Minutes of Exercise per Session: 30 min  Stress: No Stress Concern Present (01/13/2023)   [employer] of Occupational Health - Occupational Stress Questionnaire    Feeling of Stress : Not at all  Social Connections: Unknown (02/03/2023)   [facility]   Social Connections    How often do you feel lonely or isolated from those around you? (Adult - for ages 21 years and over): Not on file  Intimate Partner Violence: Unknown (11/22/2021)   Received from [facility]   HITS    Physically Hurt: Not on file    Insult or Talk Down To: Not on file    Threaten Physical Harm: Not on file    Scream or Curse: Not on file     PHYSICAL EXAM  GENERAL EXAM/CONSTITUTIONAL: Vitals:  Vitals:   05/12/23 1052  BP: 135/77  Pulse: 81  Weight: 206 lb (93.4 kg)  Height: 5\' 10"  (1.778 m)   Body mass index is 29.56 kg/m. Wt Readings from Last 3  Encounters:  05/12/23 206 lb (93.4 kg)  04/16/23 207 lb (93.9 kg)  04/03/23 216 lb 6.4 oz (98.2 kg)   Patient is in no distress; well developed, nourished and groomed; neck is supple  CARDIOVASCULAR: Examination of carotid arteries is normal; no carotid bruits Regular rate and rhythm, no murmurs Examination of peripheral vascular system by observation and palpation is normal  EYES: Ophthalmoscopic exam of optic discs and posterior segments is normal; no papilledema or hemorrhages No results found.  MUSCULOSKELETAL: Gait, strength, tone, movements noted in Neurologic exam below  NEUROLOGIC: MENTAL STATUS:     09/26/2016   11:05 AM  MMSE - Mini Mental State Exam  Orientation to time 4  Orientation to Place 5  Registration 3  Attention/ Calculation 5  Recall 3  Language- name 2 objects 2  Language- repeat 1  Language- follow 3 step command 3  Language- read & follow direction 1  Write a sentence 1  Copy design 1  Total score 29   awake, alert, oriented to person, place and time recent and remote memory intact normal attention and concentration language fluent, comprehension intact, naming intact fund of knowledge appropriate  CRANIAL NERVE:  2nd - no papilledema on fundoscopic exam 2nd, 3rd, 4th, 6th - pupils equal and reactive to light, visual fields full to confrontation, extraocular muscles intact, no nystagmus 5th - facial sensation symmetric 7th - facial strength symmetric 8th - hearing intact 9th - palate elevates symmetrically,  uvula midline 11th - shoulder shrug symmetric 12th - tongue protrusion midline  MOTOR:  normal bulk and tone, full strength in the BUE, BLE MILD POSTURAL AND ACTION TREMOR NO RIGIDITY; NO BRADYKINESIA  SENSORY:  normal and symmetric to light touch, temperature, vibration; DECR IN FEET / ANKLES  COORDINATION:  finger-nose-finger, fine finger movements normal  REFLEXES:  deep tendon reflexes TRACE and symmetric  GAIT/STATION:   narrow based gait     DIAGNOSTIC DATA (LABS, IMAGING, TESTING) - I reviewed patient records, labs, notes, testing and imaging myself where available.  Lab Results  Component Value Date   WBC 8.4 01/14/2023   HGB 15.0 01/14/2023   HCT 44.1 01/14/2023   MCV 94.5 01/14/2023   PLT 130.0 (L) 01/14/2023      Component Value Date/Time   NA 137 04/03/2023 1442   K 4.0 04/03/2023 1442   CL 101 04/03/2023 1442   CO2 25 04/03/2023 1442   GLUCOSE 131 (H) 04/03/2023 1442   BUN 13 04/03/2023 1442   CREATININE 0.96 04/03/2023 1442   CALCIUM 9.8 04/03/2023 1442   PROT 7.7 04/03/2023 1442   ALBUMIN 4.0 01/14/2023 1028   AST 25 04/03/2023 1442   ALT 34 04/03/2023 1442   ALKPHOS 77 01/14/2023 1028   BILITOT 2.2 (H) 04/03/2023 1442   GFRNONAA >60 11/22/2019 0921   GFRAA >60 11/22/2019 0921   Lab Results  Component Value Date   CHOL 177 07/28/2022   HDL 41.80 07/28/2022   LDLCALC 164 (H) 03/02/2019   LDLDIRECT 100.0 07/28/2022   TRIG 252.0 (H) 07/28/2022   CHOLHDL 4 07/28/2022   Lab Results  Component Value Date   HGBA1C 5.2 03/02/2019   No results found for: "VITAMINB12" Lab Results  Component Value Date   TSH 1.54 03/02/2019       ASSESSMENT AND PLAN  88 y.o. year old male here with:   Dx:  1. Essential tremor     PLAN:  ESSENTIAL TREMOR (since ~ 2000's) - restart propranolol LA 60mg  daily (may increase if needed); combination propranolol + primidone may work better than monotherapy - continue primidone 100mg  at bedtime; may consider increasing to 100mg  twice a day (caution with daytime sedation and INR levels)  Meds ordered this encounter  Medications   propranolol ER (INDERAL LA) 60 MG 24 hr capsule    Sig: Take 1 capsule (60 mg total) by mouth daily.    Dispense:  30 capsule    Refill:  12   Return in about 6 months (around 11/09/2023) for MyChart visit (15 min).    Suanne Marker, MD 05/12/2023, 11:44 AM Certified in Neurology,  Neurophysiology and Neuroimaging  [facility] [address], [address] [place], [place] [address] [phone]

## 2023-05-12 NOTE — Patient Instructions (Signed)
  ESSENTIAL TREMOR - restart propranolol LA 60mg  daily (may increase if needed) - continue primidone 100mg  at bedtime; may consider increasing to 100mg  twice a day (caution with daytime sedation and INR levels)

## 2023-05-13 ENCOUNTER — Encounter: Payer: Self-pay | Admitting: Family Medicine

## 2023-05-15 ENCOUNTER — Other Ambulatory Visit: Payer: Self-pay | Admitting: Family Medicine

## 2023-05-15 DIAGNOSIS — Z7901 Long term (current) use of anticoagulants: Secondary | ICD-10-CM

## 2023-05-15 NOTE — Telephone Encounter (Signed)
Pt is compliant with warfarin management and PCP apts.  Sent in refill of warfarin to requested pharmacy.      

## 2023-05-19 ENCOUNTER — Ambulatory Visit: Payer: Medicare Other | Admitting: Urology

## 2023-05-20 ENCOUNTER — Encounter: Payer: Self-pay | Admitting: Family Medicine

## 2023-05-21 ENCOUNTER — Ambulatory Visit (INDEPENDENT_AMBULATORY_CARE_PROVIDER_SITE_OTHER): Payer: Medicare Other

## 2023-05-21 DIAGNOSIS — Z0000 Encounter for general adult medical examination without abnormal findings: Secondary | ICD-10-CM | POA: Diagnosis not present

## 2023-05-21 NOTE — Patient Instructions (Addendum)
Patrick Hatfield , Thank you for taking time to come for your Medicare Wellness Visit. I appreciate your ongoing commitment to your health goals. Please review the following plan we discussed and let me know if I can assist you in the future.   Referrals/Orders/Follow-Ups/Clinician Recommendations: none  This is a list of the screening recommended for you and due dates:  Health Maintenance  Topic Date Due   COVID-19 Vaccine (4 - 2023-24 season) 04/19/2023   Medicare Annual Wellness Visit  05/20/2024   DTaP/Tdap/Td vaccine (4 - Td or Tdap) 03/07/2025   Pneumonia Vaccine  Completed   Flu Shot  Completed   Zoster (Shingles) Vaccine  Completed   HPV Vaccine  Aged Out    Advanced directives: (ACP Link)Information on Advanced Care Planning can be found at [facility] Advance Health Care Directives Advance Health Care Directives ([email or web address]   Next Medicare Annual Wellness Visit scheduled for next year: Yes   05/24/24 @ 11:00 am by video

## 2023-05-21 NOTE — Progress Notes (Signed)
Subjective:   Patrick Hatfield is a 88 y.o. male who presents for Medicare Annual/Subsequent preventive examination.  Visit Complete: Virtual  I connected with  Patrick Hatfield on 05/21/23 by a audio enabled telemedicine application and verified that I am speaking with the correct person using two identifiers.  Patient Location: Home  Provider Location: Office/Clinic  I discussed the limitations of evaluation and management by telemedicine. The patient expressed understanding and agreed to proceed.  Because this visit was a virtual/telehealth visit, some criteria may be missing or patient reported. Any vitals not documented were not able to be obtained and vitals that have been documented are patient reported.   Cardiac Risk Factors include: advanced age (>39men, >31 women);dyslipidemia;male gender;hypertension;sedentary lifestyle     Objective:    There were no vitals filed for this visit. There is no height or weight on file to calculate BMI.     05/21/2023   11:33 AM 05/15/2022    3:02 PM 05/15/2021    2:28 PM 01/18/2021    9:01 AM 01/18/2021    9:00 AM 04/27/2020    9:54 AM 04/27/2020    9:18 AM  Advanced Directives  Does Patient Have a Medical Advance Directive? No Yes Yes Yes No Yes No  Type of Special educational needs teacher of [place];Living will Healthcare Power of [place];Living will Living will  Healthcare Power of [place];Living will   Does patient want to make changes to medical advance directive?  No - Patient declined  No - Patient declined     Copy of Healthcare Power of Attorney in Chart?  No - copy requested No - copy requested      Would patient like information on creating a medical advance directive? No - Patient declined   No - Patient declined       Current Medications (verified) Outpatient Encounter Medications as of 05/21/2023  Medication Sig   acetaminophen (TYLENOL) 500 MG tablet Take 1,000 mg by mouth 2 (two) times daily as needed for moderate  pain, fever or headache.   cholecalciferol (VITAMIN D) 1000 UNITS tablet Take 1,000 Units by mouth daily.   diazepam (VALIUM) 5 MG tablet TAKE 1/2 TO 1 TABLET BY MOUTH AT BEDTIME AS NEEDED FOR SLEEP   finasteride (PROSCAR) 5 MG tablet Take 1 tablet (5 mg total) by mouth daily.   folic acid (FOLVITE) 400 MCG tablet Take 400 mcg by mouth daily.   ipratropium (ATROVENT) 0.03 % nasal spray Place 2 sprays into both nostrils 3 (three) times daily. Use as needed for nasal drip   losartan-hydrochlorothiazide (HYZAAR) 100-12.5 MG tablet Take 1 tablet by mouth daily.   omeprazole (PRILOSEC) 40 MG capsule TAKE 1 CAPSULE BY MOUTH EVERY DAY AS NEEDED   predniSONE (DELTASONE) 10 MG tablet Take 10 mg by mouth 2 (two) times daily with a meal.   primidone (MYSOLINE) 50 MG tablet Take 2 tablets (100 mg total) by mouth at bedtime. For tremor   propranolol ER (INDERAL LA) 60 MG 24 hr capsule Take 1 capsule (60 mg total) by mouth daily.   simvastatin (ZOCOR) 10 MG tablet TAKE 1 TABLET BY MOUTH EVERYDAY AT BEDTIME   Vibegron (GEMTESA) 75 MG TABS Take 1 tablet (75 mg total) by mouth daily.   warfarin (COUMADIN) 5 MG tablet TAKE 1/2 TABLET BY MOUTH DAILY EXCEPT TAKE 1 TABLET ON SATURDAYS OR AS DIRECTED BY ANTICOAGULATION CLINIC   acetaminophen-codeine (TYLENOL #3) 300-30 MG tablet Take 1-2 tablets by mouth every 8 (eight) hours  as needed for moderate pain. (Patient not taking: Reported on 05/21/2023)   Fluticasone Propionate (FLONASE NA) Place into the nose as needed. (Patient not taking: Reported on 05/21/2023)   Krill Oil 500 MG CAPS Take 1,000 mg by mouth daily. (Patient not taking: Reported on 05/21/2023)   No facility-administered encounter medications on file as of 05/21/2023.    Allergies (verified) Amlodipine besy-benazepril hcl and Lipitor [atorvastatin calcium]   History: Past Medical History:  Diagnosis Date   Anxiety    Arthritis    KNEES,ANKLES,HANDS   Asthma    AS A TEENAGER   Barrett's esophagus     Benign essential tremor    Clotting disorder (HCC)    FACTOR 5   Colitis in 20's   Colon polyps    Congenital deficiency of other clotting factors    factor 5   Depression 2006   drug overdose   Diverticulosis    DVT (deep venous thrombosis) (HCC) 1991 & 2010   X 2 in context Factor 5 def   Esophageal reflux    Factor 5 Leiden mutation, heterozygous (HCC)    Gilbert's syndrome    H/O ischemic bowel disease 2010   while off warfarin    Herpes zoster 2010   facial   Hiatal hernia    Hx of adenomatous colonic polyps 12/14/2014   Hyperlipidemia    Hypertension    Insomnia    Osteoporosis    Personal history of colonic polyps 05/28/2007   adenomatous   Proteinuria age 42   Thrombosis of mesenteric vein (HCC) 07/12/2013   03/2009   Umbilical hernia    Past Surgical History:  Procedure Laterality Date   CHOLECYSTECTOMY  05/16/2011    Dr Carolynne Edouard   COLONOSCOPY     Dr Jarold Motto   CYSTOSCOPY     Neg   EYE SURGERY Bilateral 10/16/2017   secondary catarct surgery.   KNEE ARTHROSCOPY Bilateral 2008, 2012   Dr.Aplington bilateral   TONSILLECTOMY     TRANSURETHRAL RESECTION OF PROSTATE      BPH;Dr Dahlstedt   Family History  Problem Relation Age of Onset   Pancreatic cancer Father 27   Ulcers Father    Stroke Father 92   Diabetes Father    Coronary artery disease Mother    Osteoporosis Mother    HIV Brother    Multiple sclerosis Sister    Heart attack Paternal Grandfather        early 56s   Heart attack Paternal Uncle        2 uncles in early 73s   Aortic aneurysm Brother    Colon cancer Neg Hx    Esophageal cancer Neg Hx    Stomach cancer Neg Hx    Social History   Socioeconomic History   Marital status: Married    Spouse name: Not on file   Number of children: 4   Years of education: Not on file   Highest education level: Some college, no degree  Occupational History   Occupation: Retired    Associate Professor: RETIRED  Tobacco Use   Smoking status: Former     Current packs/day: 0.00    Average packs/day: 2.0 packs/day for 40.0 years (80.0 ttl pk-yrs)    Types: Cigarettes    Start date: 08/18/1954    Quit date: 08/18/1994    Years since quitting: 28.7   Smokeless tobacco: Never   Tobacco comments:    smoked 1956-1996, up to 2 ppd  Vaping Use   Vaping status:  Never Used  Substance and Sexual Activity   Alcohol use: Yes    Alcohol/week: 3.0 standard drinks of alcohol    Types: 3 Cans of beer per week   Drug use: No   Sexual activity: Not Currently    Birth control/protection: None    Comment: Married  Other Topics Concern   Not on file  Social History Narrative   Married, lives with spouse   Married x56 years   4 daughters   4 grandchildren   Retired - Psychologist, sport and exercise, sales/technology   Social Determinants of Health   Financial Resource Strain: Low Risk  (05/21/2023)   Overall Financial Resource Strain (CARDIA)    Difficulty of Paying Living Expenses: Not hard at all  Food Insecurity: No Food Insecurity (05/21/2023)   Hunger Vital Sign    Worried About Running Out of Food in the Last Year: Never true    Ran Out of Food in the Last Year: Never true  Transportation Needs: No Transportation Needs (05/21/2023)   PRAPARE - Administrator, Civil Service (Medical): No    Lack of Transportation (Non-Medical): No  Physical Activity: Insufficiently Active (05/21/2023)   Exercise Vital Sign    Days of Exercise per Week: 2 days    Minutes of Exercise per Session: 20 min  Stress: No Stress Concern Present (05/21/2023)   [employer] of Occupational Health - Occupational Stress Questionnaire    Feeling of Stress : Only a little  Social Connections: Moderately Integrated (05/21/2023)   Social Connection and Isolation Panel [NHANES]    Frequency of Communication with Friends and Family: More than three times a week    Frequency of Social Gatherings with Friends and Family: Three times a week    Attends Religious Services: More than  4 times per year    Active Member of Clubs or Organizations: No    Attends Banker Meetings: Never    Marital Status: Married    Tobacco Counseling Counseling given: Not Answered Tobacco comments: smoked 1956-1996, up to 2 ppd   Clinical Intake:  Pre-visit preparation completed: Yes  Pain : No/denies pain     Nutritional Status: BMI 25 -29 Overweight Nutritional Risks: None Diabetes: No  How often do you need to have someone help you when you read instructions, pamphlets, or other written materials from your doctor or pharmacy?: 1 - Never  Interpreter Needed?: No  Information entered by :: Kennedy Bucker, LPN   Activities of Daily Living    05/21/2023   11:34 AM 05/20/2023    2:36 PM  In your present state of health, do you have any difficulty performing the following activities:  Hearing? 1 0  Vision? 0 0  Difficulty concentrating or making decisions? 0 0  Walking or climbing stairs? 0 0  Dressing or bathing? 0 0  Doing errands, shopping? 0 0  Preparing Food and eating ? N N  Using the Toilet? N N  In the past six months, have you accidently leaked urine? N N  Do you have problems with loss of bowel control? N N  Managing your Medications? N N  Managing your Finances? N N  Housekeeping or managing your Housekeeping? N N    Patient Care Team: Copland, Gwenlyn Found, MD as PCP - General (Family Medicine) Ranee Gosselin, MD as Consulting Physician (Orthopedic Surgery) Iva Boop, MD as Consulting Physician (Gastroenterology) Chilton Greathouse, MD as Consulting Physician (Pulmonary Disease)  Indicate any recent Medical Services you may  have received from other than [facility] providers in the past year (date may be approximate).     Assessment:   This is a routine wellness examination for Karon.  Hearing/Vision screen Hearing Screening - Comments:: Wears aids Vision Screening - Comments:: Wears glasses- MD in [place]   Goals Addressed              This Visit's Progress    DIET - EAT MORE FRUITS AND VEGETABLES         Depression Screen    05/21/2023   11:32 AM 07/28/2022   11:07 AM 05/15/2022    3:03 PM 01/22/2022   10:36 AM 07/24/2021   10:45 AM 05/15/2021    2:33 PM 01/31/2021    1:31 PM  PHQ 2/9 Scores  PHQ - 2 Score 0 0 0 0 0 0 0  PHQ- 9 Score 0          Fall Risk    05/21/2023   11:34 AM 05/20/2023    2:36 PM 07/28/2022   11:07 AM 05/15/2022    3:03 PM 01/22/2022   10:36 AM  Fall Risk   Falls in the past year? 1 1 0 0 0  Number falls in past yr: 0 0 0 0 0  Injury with Fall? 0 1 0 0 0  Risk for fall due to : History of fall(s)  No Fall Risks No Fall Risks   Follow up Falls evaluation completed;Falls prevention discussed  Falls evaluation completed Falls evaluation completed     MEDICARE RISK AT HOME: Medicare Risk at Home Any stairs in or around the home?: No If so, are there any without handrails?: No Home free of loose throw rugs in walkways, pet beds, electrical cords, etc?: Yes Adequate lighting in your home to reduce risk of falls?: Yes Life alert?: No Use of a cane, walker or w/c?: No Grab bars in the bathroom?: Yes Shower chair or bench in shower?: Yes Elevated toilet seat or a handicapped toilet?: No  TIMED UP AND GO:  Was the test performed?  No    Cognitive Function:    09/26/2016   11:05 AM  MMSE - Mini Mental State Exam  Orientation to time 4  Orientation to Place 5  Registration 3  Attention/ Calculation 5  Recall 3  Language- name 2 objects 2  Language- repeat 1  Language- follow 3 step command 3  Language- read & follow direction 1  Write a sentence 1  Copy design 1  Total score 29        05/21/2023   11:35 AM 05/15/2022    3:17 PM  6CIT Screen  What Year? 0 points 0 points  What month? 0 points 0 points  What time? 0 points 0 points  Count back from 20 0 points 0 points  Months in reverse 0 points 0 points  Repeat phrase 0 points 0 points  Total Score 0 points 0 points     Immunizations Immunization History  Administered Date(s) Administered   DT (Pediatric) 02/16/2015   Fluad Quad(high Dose 65+) 05/10/2019, 05/17/2020, 06/03/2022   Fluad Trivalent(High Dose 65+) 05/05/2023   Influenza Split 05/03/2012   Influenza Whole 07/24/2010   Influenza, High Dose Seasonal PF 05/20/2013, 05/09/2016, 05/15/2017, 04/20/2018   Influenza,inj,Quad PF,6+ Mos 05/03/2014, 05/16/2015   Influenza-Unspecified 06/24/2021   PFIZER(Purple Top)SARS-COV-2 Vaccination 08/27/2019, 09/17/2019, 07/31/2020   Pneumococcal Conjugate-13 09/05/2015   Pneumococcal Polysaccharide-23 09/26/2016   Respiratory Syncytial Virus Vaccine,Recomb Aduvanted(Arexvy) 06/24/2022   Td 03/03/2015  Tdap 03/08/2015   Zoster Recombinant(Shingrix) 02/22/2018, 10/05/2018    TDAP status: Up to date  Flu Vaccine status: Up to date  Pneumococcal vaccine status: Up to date  Covid-19 vaccine status: Completed vaccines  Qualifies for Shingles Vaccine? Yes   Zostavax completed No   Shingrix Completed?: Yes  Screening Tests Health Maintenance  Topic Date Due   COVID-19 Vaccine (4 - 2023-24 season) 04/19/2023   Medicare Annual Wellness (AWV)  05/20/2024   DTaP/Tdap/Td (4 - Td or Tdap) 03/07/2025   Pneumonia Vaccine 30+ Years old  Completed   INFLUENZA VACCINE  Completed   Zoster Vaccines- Shingrix  Completed   HPV VACCINES  Aged Out    Health Maintenance  Health Maintenance Due  Topic Date Due   COVID-19 Vaccine (4 - 2023-24 season) 04/19/2023    Colorectal cancer screening: No longer required.   Lung Cancer Screening: (Low Dose CT Chest recommended if Age 80-80 years, 20 pack-year currently smoking OR have quit w/in 15years.) does not qualify.    Additional Screening:  Hepatitis C Screening: does not qualify; Completed no  Vision Screening: Recommended annual ophthalmology exams for early detection of glaucoma and other disorders of the eye. Is the patient up to date with their  annual eye exam?  Yes  Who is the provider or what is the name of the office in which the patient attends annual eye exams? [facility] in [place] If pt is not established with a provider, would they like to be referred to a provider to establish care? No .   Dental Screening: Recommended annual dental exams for proper oral hygiene   Community Resource Referral / Chronic Care Management: CRR required this visit?  No   CCM required this visit?  No     Plan:     I have personally reviewed and noted the following in the patient's chart:   Medical and social history Use of alcohol, tobacco or illicit drugs  Current medications and supplements including opioid prescriptions. Patient is currently taking opioid prescriptions. Information provided to patient regarding non-opioid alternatives. Patient advised to discuss non-opioid treatment plan with their provider. Functional ability and status Nutritional status Physical activity Advanced directives List of other physicians Hospitalizations, surgeries, and ER visits in previous 12 months Vitals Screenings to include cognitive, depression, and falls Referrals and appointments  In addition, I have reviewed and discussed with patient certain preventive protocols, quality metrics, and best practice recommendations. A written personalized care plan for preventive services as well as general preventive health recommendations were provided to patient.     Hal Hope, LPN   16/08/958   After Visit Summary: (MyChart) Due to this being a telephonic visit, the after visit summary with patients personalized plan was offered to patient via MyChart   Nurse Notes: none

## 2023-05-22 ENCOUNTER — Ambulatory Visit (INDEPENDENT_AMBULATORY_CARE_PROVIDER_SITE_OTHER): Payer: Medicare Other

## 2023-05-22 DIAGNOSIS — Z7901 Long term (current) use of anticoagulants: Secondary | ICD-10-CM

## 2023-05-22 LAB — POCT INR: INR: 1.5 — AB (ref 2.0–3.0)

## 2023-05-22 NOTE — Patient Instructions (Addendum)
Pre visit review using our clinic review tool, if applicable. No additional management support is needed unless otherwise documented below in the visit note.  Increase dose today to take 1 tablet and then change weekly dose to take 1/2 tablet daily except take 1 tablet on Tuesdays and Saturdays. Recheck in 2 weeks.

## 2023-05-22 NOTE — Progress Notes (Signed)
Primidone dose increased to 100 mg every day. Pt decrease fish oil to once daily. Fish oil increases INR and with the decrease in fish oil dosing it reduced INR when he reduced that dose.  Increase dose today to take 1 tablet and then change weekly dose to take 1/2 tablet daily except take 1 tablet on Tuesdays and Saturdays. Recheck in 2 weeks.

## 2023-05-26 ENCOUNTER — Encounter: Payer: Self-pay | Admitting: Family Medicine

## 2023-05-27 ENCOUNTER — Encounter: Payer: Self-pay | Admitting: Family Medicine

## 2023-05-30 ENCOUNTER — Other Ambulatory Visit: Payer: Self-pay | Admitting: Family Medicine

## 2023-05-30 DIAGNOSIS — G250 Essential tremor: Secondary | ICD-10-CM

## 2023-06-02 ENCOUNTER — Ambulatory Visit (INDEPENDENT_AMBULATORY_CARE_PROVIDER_SITE_OTHER): Payer: Medicare Other

## 2023-06-02 DIAGNOSIS — Z7901 Long term (current) use of anticoagulants: Secondary | ICD-10-CM

## 2023-06-02 LAB — POCT INR: INR: 2.3 (ref 2.0–3.0)

## 2023-06-02 NOTE — Progress Notes (Signed)
Primidone dose increased to 100 mg every day. Pt decrease fish oil to once daily. Fish oil increases INR and with the decrease in fish oil dosing it reduced INR when he reduced that dose. Pt reports he has also been advised to stop prednisone and took last dose yesterday.  Due to recent d/c of prednisone will recheck INR in 2 weeks.  Continue 1/2 tablet daily except take 1 tablet on Tuesdays and Saturdays. Recheck in 2 week.

## 2023-06-02 NOTE — Patient Instructions (Addendum)
Pre visit review using our clinic review tool, if applicable. No additional management support is needed unless otherwise documented below in the visit note.  Continue 1/2 tablet daily except take 1 tablet on Tuesdays and Saturdays. Recheck in 2 week.

## 2023-06-03 ENCOUNTER — Telehealth: Payer: Self-pay

## 2023-06-03 NOTE — Telephone Encounter (Signed)
Please see the message that the pt sent via "appointment pool:"   Patient comments: Dr. Patsy Lager we discussed the deep bruise that occurred over a month ago and I understand it takes time to heal, however it has gotten progressively more painful.  For my peace of mind if nothing else I believe it needs to be examined.  I requested next week because Claris Che and I are leaving for a long beach weekend tomorrow.  Thank you for caring.  Rosanne Ashing

## 2023-06-09 NOTE — Progress Notes (Unsigned)
[facility] [address] [place], [place] [address] [phone]  Date:  06/10/2023   Name:  Patrick Hatfield   DOB:  Aug 28, 1934   MRN:  [id number]  PCP:  Pearline Cables, MD    Chief Complaint: No chief complaint on file.   History of Present Illness:  Patrick Hatfield is a 88 y.o. very pleasant male patient who presents with the following:  Our most recent visit together was in June of this year, although we have exchanged a few messages in the interim regarding tremor-  history of factor V Leiden, DVT, mesenteric vein thrombosis currently on Coumadin, essential tremor, hypertension, hyperlipidemia.  Patient seen today with concern of a possible injury that occurred when he fell in early [place] became concerned as he has continued to hurt.  He sent the following MyChart message on 10/9: Dr. Patsy Lager I'm sorry to impose on you once again so soon, but I have an issue I believe you should be aware of. I had a fall around September 1 or 2. It was not because of balance, but caught my foot on the leg of a chair in our master bedroom and tumbled. I grabbed the post on the end of the bed, it broke and spun me around and I fell on the broken post on my right side and landed on my butt. I went to my orthopedeist just to check things out . He xrayed and only found a bruised coccyx bone, no back injuries. I endured that for about a month and I'm over the coccyx now, but where I hit my side there was a significant bruise which is gone, but it still hurts occassionally. When I roll over in bed especially there is semi sharp pain. Should you check it out?   He was out of town for a while and made an appointment to be seen today  He is also seen neurology in the interim, visit with Dr. Marjory Lies in September They had him restart propranolol LA and continue primidone at bedtime Patient Active Problem List   Diagnosis Date Noted    Acute cough 08/05/2022   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/2013   Thrombocytopenia, unspecified (HCC) 01/05/2013   Sullivan Lone syndrome 01/05/2013   Unspecified adverse effect of unspecified drug, medicinal and biological substance 01/09/2012   Barrett's esophagus 03/24/2011   Factor 5 Leiden mutation, heterozygous (HCC) 03/21/2011   Essential tremor 04/24/2009   Hyperlipidemia 10/03/2008   Essential hypertension 10/03/2008   GERD 10/03/2008   Fasting hyperglycemia 10/03/2008   DVT, HX OF 10/03/2008    Past Medical History:  Diagnosis Date   Anxiety    Arthritis    KNEES,ANKLES,HANDS   Asthma    AS A TEENAGER   Barrett's esophagus    Benign essential tremor    Clotting disorder (HCC)    FACTOR 5   Colitis in 20's   Colon polyps    Congenital deficiency of other clotting factors    factor 5   Depression 2006   drug overdose   Diverticulosis    DVT (deep venous thrombosis) (HCC) 1991 & 2010   X 2  in context Factor 5 def   Esophageal reflux    Factor 5 Leiden mutation, heterozygous (HCC)    Gilbert's syndrome    H/O ischemic bowel disease 2010   while off warfarin    Herpes zoster 2010   facial   Hiatal hernia    Hx of adenomatous colonic polyps 12/14/2014   Hyperlipidemia    Hypertension    Insomnia    Osteoporosis    Personal history of colonic polyps 05/28/2007   adenomatous   Proteinuria age 58   Thrombosis of mesenteric vein (HCC) 07/12/2013   03/2009   Umbilical hernia     Past Surgical History:  Procedure Laterality Date   CHOLECYSTECTOMY  05/16/2011    Dr Carolynne Edouard   COLONOSCOPY     Dr Jarold Motto   CYSTOSCOPY     Neg   EYE SURGERY Bilateral 10/16/2017    secondary catarct surgery.   KNEE ARTHROSCOPY Bilateral 2008, 2012   Dr.Aplington bilateral   TONSILLECTOMY     TRANSURETHRAL RESECTION OF PROSTATE      BPH;Dr Dahlstedt    Social History   Tobacco Use   Smoking status: Former    Current packs/day: 0.00    Average packs/day: 2.0 packs/day for 40.0 years (80.0 ttl pk-yrs)    Types: Cigarettes    Start date: 08/18/1954    Quit date: 08/18/1994    Years since quitting: 28.8   Smokeless tobacco: Never   Tobacco comments:    smoked 1956-1996, up to 2 ppd  Vaping Use   Vaping status: Never Used  Substance Use Topics   Alcohol use: Yes    Alcohol/week: 3.0 standard drinks of alcohol    Types: 3 Cans of beer per week   Drug use: No    Family History  Problem Relation Age of Onset   Pancreatic cancer Father 73   Ulcers Father    Stroke Father 55   Diabetes Father    Coronary artery disease Mother    Osteoporosis Mother    HIV Brother    Multiple sclerosis Sister    Heart attack Paternal Grandfather        early 61s   Heart attack Paternal Uncle        2 uncles in early 80s   Aortic aneurysm Brother    Colon cancer Neg Hx    Esophageal cancer Neg Hx    Stomach cancer Neg Hx     Allergies  Allergen Reactions   Amlodipine Besy-Benazepril Hcl     REACTION: swelling of feet.  Probably  from Amlodipine component)   Lipitor [Atorvastatin Calcium]     ? Reaction, ? Swelling=- update 7/20.  Pt recalls his legs hurt and perhaps swelled slightly - JC    Medication list has been reviewed and updated.  Current Outpatient Medications on File Prior to Visit  Medication Sig Dispense Refill   acetaminophen (TYLENOL) 500 MG tablet Take 1,000 mg by mouth 2 (two) times daily as needed for moderate pain, fever or headache.     acetaminophen-codeine (TYLENOL #3) 300-30 MG tablet Take 1-2 tablets by mouth every 8 (eight) hours as needed for moderate pain. (Patient not taking: Reported on 05/21/2023) 20 tablet 0   cholecalciferol (VITAMIN  D) 1000 UNITS tablet Take 1,000 Units by mouth daily.     diazepam (VALIUM) 5 MG tablet TAKE 1/2 TO 1 TABLET BY MOUTH AT BEDTIME AS NEEDED FOR SLEEP 30 tablet 1   finasteride (PROSCAR) 5 MG tablet Take 1 tablet (  5 mg total) by mouth daily. 90 tablet 3   Fluticasone Propionate (FLONASE NA) Place into the nose as needed. (Patient not taking: Reported on 05/21/2023)     folic acid (FOLVITE) 400 MCG tablet Take 400 mcg by mouth daily.     ipratropium (ATROVENT) 0.03 % nasal spray Place 2 sprays into both nostrils 3 (three) times daily. Use as needed for nasal drip 30 mL 6   Krill Oil 500 MG CAPS Take 1,000 mg by mouth daily. (Patient not taking: Reported on 05/21/2023)     losartan-hydrochlorothiazide (HYZAAR) 100-12.5 MG tablet Take 1 tablet by mouth daily. 90 tablet 1   omeprazole (PRILOSEC) 40 MG capsule TAKE 1 CAPSULE BY MOUTH EVERY DAY AS NEEDED 90 capsule 1   predniSONE (DELTASONE) 10 MG tablet Take 10 mg by mouth 2 (two) times daily with a meal.     primidone (MYSOLINE) 50 MG tablet Take 2 tablets (100 mg total) by mouth at bedtime. For tremor 180 tablet 2   propranolol ER (INDERAL LA) 60 MG 24 hr capsule Take 1 capsule (60 mg total) by mouth daily. 30 capsule 12   simvastatin (ZOCOR) 10 MG tablet TAKE 1 TABLET BY MOUTH EVERYDAY AT BEDTIME 90 tablet 3   Vibegron (GEMTESA) 75 MG TABS Take 1 tablet (75 mg total) by mouth daily. 30 tablet 5   warfarin (COUMADIN) 5 MG tablet TAKE 1/2 TABLET BY MOUTH DAILY EXCEPT TAKE 1 TABLET ON SATURDAYS OR AS DIRECTED BY ANTICOAGULATION CLINIC 90 tablet 1   No current facility-administered medications on file prior to visit.    Review of Systems:  ***  Physical Examination: There were no vitals filed for this visit. There were no vitals filed for this visit. There is no height or weight on file to calculate BMI. Ideal Body Weight:    ***  Assessment and Plan: ***  Signed Abbe Amsterdam, MD

## 2023-06-09 NOTE — Patient Instructions (Incomplete)
It was great to see you today as always.  Recommend COVID booster if not done yet this fall  I will be in touch with your blood test results If your abdominal pain should return we can set you up for a CT- just let me know We will send you home with the kit to collect a stool sample should the diarrhea return.  Let me know in this case as well- we may decide to treat you for E coli if the diarrhea comes back!

## 2023-06-10 ENCOUNTER — Encounter: Payer: Self-pay | Admitting: Family Medicine

## 2023-06-10 ENCOUNTER — Ambulatory Visit (INDEPENDENT_AMBULATORY_CARE_PROVIDER_SITE_OTHER): Payer: Medicare Other | Admitting: Family Medicine

## 2023-06-10 VITALS — BP 112/60 | HR 72 | Temp 97.8°F | Resp 18 | Ht 70.0 in | Wt 203.4 lb

## 2023-06-10 DIAGNOSIS — I10 Essential (primary) hypertension: Secondary | ICD-10-CM | POA: Diagnosis not present

## 2023-06-10 DIAGNOSIS — G250 Essential tremor: Secondary | ICD-10-CM

## 2023-06-10 DIAGNOSIS — R197 Diarrhea, unspecified: Secondary | ICD-10-CM | POA: Diagnosis not present

## 2023-06-10 DIAGNOSIS — R1032 Left lower quadrant pain: Secondary | ICD-10-CM | POA: Diagnosis not present

## 2023-06-10 LAB — COMPREHENSIVE METABOLIC PANEL
ALT: 33 U/L (ref 0–53)
AST: 29 U/L (ref 0–37)
Albumin: 4.2 g/dL (ref 3.5–5.2)
Alkaline Phosphatase: 65 U/L (ref 39–117)
BUN: 8 mg/dL (ref 6–23)
CO2: 28 meq/L (ref 19–32)
Calcium: 9.1 mg/dL (ref 8.4–10.5)
Chloride: 96 meq/L (ref 96–112)
Creatinine, Ser: 0.82 mg/dL (ref 0.40–1.50)
GFR: 79.46 mL/min (ref >60.00)
Glucose, Bld: 125 mg/dL — ABNORMAL HIGH (ref 70–99)
Potassium: 3.4 meq/L — ABNORMAL LOW (ref 3.5–5.1)
Sodium: 133 meq/L — ABNORMAL LOW (ref 135–145)
Total Bilirubin: 2 mg/dL — ABNORMAL HIGH (ref 0.2–1.2)
Total Protein: 7 g/dL (ref 6.0–8.3)

## 2023-06-10 LAB — CBC
HCT: 40.2 % (ref 39.0–52.0)
Hemoglobin: 13.6 g/dL (ref 13.0–17.0)
MCHC: 33.8 g/dL (ref 30.0–36.0)
MCV: 94.7 fL (ref 78.0–100.0)
Platelets: 168 10*3/uL (ref 150.0–400.0)
RBC: 4.25 Mil/uL (ref 4.22–5.81)
RDW: 14.9 % (ref 11.5–15.5)
WBC: 6.9 10*3/uL (ref 4.0–10.5)

## 2023-06-11 ENCOUNTER — Other Ambulatory Visit: Payer: Self-pay | Admitting: Family Medicine

## 2023-06-11 ENCOUNTER — Encounter: Payer: Self-pay | Admitting: Family Medicine

## 2023-06-11 DIAGNOSIS — M545 Low back pain, unspecified: Secondary | ICD-10-CM

## 2023-06-11 MED ORDER — ACETAMINOPHEN-CODEINE 300-30 MG PO TABS
1.0000 | ORAL_TABLET | Freq: Three times a day (TID) | ORAL | 0 refills | Status: DC | PRN
Start: 2023-06-11 — End: 2023-09-24

## 2023-06-12 ENCOUNTER — Encounter: Payer: Self-pay | Admitting: Family Medicine

## 2023-06-16 ENCOUNTER — Ambulatory Visit (INDEPENDENT_AMBULATORY_CARE_PROVIDER_SITE_OTHER): Payer: Medicare Other

## 2023-06-16 DIAGNOSIS — Z7901 Long term (current) use of anticoagulants: Secondary | ICD-10-CM | POA: Diagnosis not present

## 2023-06-16 LAB — POCT INR: INR: 1.9 — AB (ref 2.0–3.0)

## 2023-06-16 NOTE — Progress Notes (Addendum)
Pt missed dose last week. Increase dose today to take 1 1/2 tablets and then continue 1/2 tablet daily except take 1 tablet on Tuesdays and Saturdays. Recheck in 4 week.

## 2023-06-16 NOTE — Patient Instructions (Addendum)
Pre visit review using our clinic review tool, if applicable. No additional management support is needed unless otherwise documented below in the visit note.  Increase dose today to take 1 1/2 tablets and then continue 1/2 tablet daily except take 1 tablet on Tuesdays and Saturdays. Recheck in 4 week.

## 2023-06-23 DIAGNOSIS — Z961 Presence of intraocular lens: Secondary | ICD-10-CM | POA: Diagnosis not present

## 2023-06-23 DIAGNOSIS — H524 Presbyopia: Secondary | ICD-10-CM | POA: Diagnosis not present

## 2023-06-23 DIAGNOSIS — H04123 Dry eye syndrome of bilateral lacrimal glands: Secondary | ICD-10-CM | POA: Diagnosis not present

## 2023-07-01 ENCOUNTER — Encounter: Payer: Self-pay | Admitting: Family Medicine

## 2023-07-01 DIAGNOSIS — R2241 Localized swelling, mass and lump, right lower limb: Secondary | ICD-10-CM

## 2023-07-01 DIAGNOSIS — I10 Essential (primary) hypertension: Secondary | ICD-10-CM

## 2023-07-01 MED ORDER — HYDROCHLOROTHIAZIDE 12.5 MG PO CAPS: 12.5000 mg | ORAL_CAPSULE | Freq: Every day | ORAL | 3 refills | Status: DC

## 2023-07-03 ENCOUNTER — Other Ambulatory Visit (INDEPENDENT_AMBULATORY_CARE_PROVIDER_SITE_OTHER): Payer: Medicare Other

## 2023-07-03 ENCOUNTER — Encounter: Payer: Self-pay | Admitting: Family Medicine

## 2023-07-03 ENCOUNTER — Ambulatory Visit (HOSPITAL_BASED_OUTPATIENT_CLINIC_OR_DEPARTMENT_OTHER)
Admission: RE | Admit: 2023-07-03 | Discharge: 2023-07-03 | Disposition: A | Discharge status: Home / Self Care | Payer: Medicare Other | Source: Ambulatory Visit | Attending: Family Medicine | Admitting: Family Medicine

## 2023-07-03 DIAGNOSIS — R0989 Other specified symptoms and signs involving the circulatory and respiratory systems: Secondary | ICD-10-CM | POA: Diagnosis not present

## 2023-07-03 DIAGNOSIS — M7989 Other specified soft tissue disorders: Secondary | ICD-10-CM | POA: Diagnosis not present

## 2023-07-03 DIAGNOSIS — I10 Essential (primary) hypertension: Secondary | ICD-10-CM

## 2023-07-03 DIAGNOSIS — R2241 Localized swelling, mass and lump, right lower limb: Secondary | ICD-10-CM | POA: Diagnosis not present

## 2023-07-03 DIAGNOSIS — R197 Diarrhea, unspecified: Secondary | ICD-10-CM | POA: Diagnosis not present

## 2023-07-03 LAB — BASIC METABOLIC PANEL
BUN: 8 mg/dL (ref 6–23)
CO2: 30 meq/L (ref 19–32)
Calcium: 9.3 mg/dL (ref 8.4–10.5)
Chloride: 100 meq/L (ref 96–112)
Creatinine, Ser: 0.79 mg/dL (ref 0.40–1.50)
GFR: 80.32 mL/min (ref >60.00)
Glucose, Bld: 109 mg/dL — ABNORMAL HIGH (ref 70–99)
Potassium: 3.8 meq/L (ref 3.5–5.1)
Sodium: 138 meq/L (ref 135–145)

## 2023-07-03 NOTE — Progress Notes (Signed)
Specimen drop off and labs completed.

## 2023-07-04 ENCOUNTER — Encounter: Payer: Self-pay | Admitting: Family Medicine

## 2023-07-04 ENCOUNTER — Other Ambulatory Visit: Payer: Self-pay | Admitting: Family Medicine

## 2023-07-04 DIAGNOSIS — G250 Essential tremor: Secondary | ICD-10-CM

## 2023-07-04 LAB — CLOSTRIDIUM DIFFICILE BY PCR: Toxigenic C. Difficile by PCR: NEGATIVE

## 2023-07-08 ENCOUNTER — Encounter: Payer: Self-pay | Admitting: Family Medicine

## 2023-07-08 LAB — STOOL CULTURE: E coli, Shiga toxin Assay: NEGATIVE

## 2023-07-14 ENCOUNTER — Ambulatory Visit (INDEPENDENT_AMBULATORY_CARE_PROVIDER_SITE_OTHER): Payer: Medicare Other

## 2023-07-14 DIAGNOSIS — Z7901 Long term (current) use of anticoagulants: Secondary | ICD-10-CM | POA: Diagnosis not present

## 2023-07-14 LAB — POCT INR: INR: 1.5 — AB (ref 2.0–3.0)

## 2023-07-14 NOTE — Patient Instructions (Addendum)
Pre visit review using our clinic review tool, if applicable. No additional management support is needed unless otherwise documented below in the visit note.  Increase dose today to take 1 1/2 tablets and increase dose tomorrow to take 1 tablet and then change weekly dose to take  1/2 tablet daily except take 1 tablet on Tuesdays, Thursdays and Saturdays. Recheck in 2 week.

## 2023-07-14 NOTE — Progress Notes (Addendum)
Increase dose today to take 1 1/2 tablets and increase dose tomorrow to take 1 tablet and then change weekly dose to take  1/2 tablet daily except take 1 tablet on Tuesdays, Thursdays and Saturdays. Recheck in 2 week.  Medical screening examination/treatment/procedure(s) were performed by non-physician practitioner and as supervising physician I was immediately available for consultation/collaboration.  I agree with above. Jacinta Shoe, MD

## 2023-07-19 NOTE — Progress Notes (Unsigned)
[facility] [address] [place], [place] [address] [phone]  Date:  07/22/2023   Name:  Patrick Hatfield   DOB:  03-29-1935   MRN:  [id number]  PCP:  Pearline Cables, MD    Chief Complaint: No chief complaint on file.   History of Present Illness:  Patrick Hatfield is a 88 y.o. very pleasant male patient who presents with the following:  Patient seen today for follow-up- history of factor V Leiden, DVT, mesenteric vein thrombosis currently on Coumadin, essential tremor, hypertension, hyperlipidemia.  Most recent visit with myself was in October when he was doing some diarrhea as well as a recent fall and bruising of his rib area We did end up doing some stool cultures at that time which were negative  Patient Active Problem List   Diagnosis Date Noted   Acute cough 08/05/2022   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/2013   Thrombocytopenia, unspecified (HCC) 01/05/2013   Sullivan Lone syndrome 01/05/2013   Unspecified adverse effect of unspecified drug, medicinal and biological substance 01/09/2012   Barrett's esophagus 03/24/2011   Factor 5 Leiden mutation, heterozygous (HCC) 03/21/2011   Essential tremor 04/24/2009   Hyperlipidemia 10/03/2008   Essential hypertension 10/03/2008   GERD 10/03/2008   Fasting hyperglycemia 10/03/2008   DVT, HX OF 10/03/2008    Past Medical History:  Diagnosis Date   Anxiety    Arthritis    KNEES,ANKLES,HANDS   Asthma    AS A TEENAGER   Barrett's esophagus    Benign essential tremor    Clotting disorder (HCC)    FACTOR 5   Colitis in 20's   Colon polyps    Congenital deficiency  of other clotting factors    factor 5   Depression 2006   drug overdose   Diverticulosis    DVT (deep venous thrombosis) (HCC) 1991 & 2010   X 2 in context Factor 5 def   Esophageal reflux    Factor 5 Leiden mutation, heterozygous (HCC)    Gilbert's syndrome    H/O ischemic bowel disease 2010   while off warfarin    Herpes zoster 2010   facial   Hiatal hernia    Hx of adenomatous colonic polyps 12/14/2014   Hyperlipidemia    Hypertension    Insomnia    Osteoporosis    Personal history of colonic polyps 05/28/2007   adenomatous   Proteinuria age 82   Thrombosis of mesenteric vein (HCC) 07/12/2013   03/2009   Umbilical hernia     Past Surgical History:  Procedure Laterality Date   CHOLECYSTECTOMY  05/16/2011    Dr Carolynne Edouard   COLONOSCOPY     Dr Jarold Motto   CYSTOSCOPY     Neg   EYE SURGERY Bilateral 10/16/2017   secondary catarct surgery.   KNEE ARTHROSCOPY Bilateral 2008, 2012   Dr.Aplington bilateral   TONSILLECTOMY     TRANSURETHRAL RESECTION OF PROSTATE      BPH;Dr Dahlstedt    Social History   Tobacco Use   Smoking status: Former    Current packs/day: 0.00    Average packs/day: 2.0 packs/day for 40.0 years (80.0 ttl  pk-yrs)    Types: Cigarettes    Start date: 08/18/1954    Quit date: 08/18/1994    Years since quitting: 28.9   Smokeless tobacco: Never   Tobacco comments:    smoked 1956-1996, up to 2 ppd  Vaping Use   Vaping status: Never Used  Substance Use Topics   Alcohol use: Yes    Alcohol/week: 3.0 standard drinks of alcohol    Types: 3 Cans of beer per week   Drug use: No    Family History  Problem Relation Age of Onset   Pancreatic cancer Father 62   Ulcers Father    Stroke Father 51   Diabetes Father    Coronary artery disease Mother    Osteoporosis Mother    HIV Brother    Multiple sclerosis Sister    Heart attack Paternal Grandfather        early 38s   Heart attack Paternal Uncle        2 uncles in early 33s   Aortic aneurysm Brother     Colon cancer Neg Hx    Esophageal cancer Neg Hx    Stomach cancer Neg Hx     Allergies  Allergen Reactions   Amlodipine Besy-Benazepril Hcl     REACTION: swelling of feet.  Probably  from Amlodipine component)   Lipitor [Atorvastatin Calcium]     ? Reaction, ? Swelling=- update 7/20.  Pt recalls his legs hurt and perhaps swelled slightly - JC    Medication list has been reviewed and updated.  Current Outpatient Medications on File Prior to Visit  Medication Sig Dispense Refill   acetaminophen (TYLENOL) 500 MG tablet Take 1,000 mg by mouth 2 (two) times daily as needed for moderate pain, fever or headache.     acetaminophen-codeine (TYLENOL #3) 300-30 MG tablet Take 1-2 tablets by mouth every 8 (eight) hours as needed for moderate pain (pain score 4-6). 30 tablet 0   cholecalciferol (VITAMIN D) 1000 UNITS tablet Take 1,000 Units by mouth daily.     diazepam (VALIUM) 5 MG tablet TAKE 1/2 TO 1 TABLET BY MOUTH AT BEDTIME AS NEEDED FOR SLEEP 30 tablet 1   finasteride (PROSCAR) 5 MG tablet Take 1 tablet (5 mg total) by mouth daily. 90 tablet 3   folic acid (FOLVITE) 400 MCG tablet Take 400 mcg by mouth daily.     hydrochlorothiazide (MICROZIDE) 12.5 MG capsule Take 1 capsule (12.5 mg total) by mouth daily. 30 capsule 3   ipratropium (ATROVENT) 0.03 % nasal spray Place 2 sprays into both nostrils 3 (three) times daily. Use as needed for nasal drip 30 mL 6   losartan-hydrochlorothiazide (HYZAAR) 100-12.5 MG tablet Take 1 tablet by mouth daily. 90 tablet 1   Omega-3 Fatty Acids (FISH OIL PO) Take 1,400 mg by mouth daily.     omeprazole (PRILOSEC) 40 MG capsule TAKE 1 CAPSULE BY MOUTH EVERY DAY AS NEEDED 90 capsule 1   primidone (MYSOLINE) 50 MG tablet Take 2 tablets (100 mg total) by mouth at bedtime. For tremor 180 tablet 2   propranolol ER (INDERAL LA) 60 MG 24 hr capsule Take 1 capsule (60 mg total) by mouth daily. 30 capsule 12   simvastatin (ZOCOR) 10 MG tablet TAKE 1 TABLET BY MOUTH  EVERYDAY AT BEDTIME 90 tablet 3   warfarin (COUMADIN) 5 MG tablet TAKE 1/2 TABLET BY MOUTH DAILY EXCEPT TAKE 1 TABLET ON SATURDAYS OR AS DIRECTED BY ANTICOAGULATION CLINIC 90 tablet 1   No current facility-administered  medications on file prior to visit.    Review of Systems:  As per HPI- otherwise negative.   Physical Examination: There were no vitals filed for this visit. There were no vitals filed for this visit. There is no height or weight on file to calculate BMI. Ideal Body Weight:    GEN: no acute distress. HEENT: Atraumatic, Normocephalic.  Ears and Nose: No external deformity. CV: RRR, No M/G/R. No JVD. No thrill. No extra heart sounds. PULM: CTA B, no wheezes, crackles, rhonchi. No retractions. No resp. distress. No accessory muscle use. ABD: S, NT, ND, +BS. No rebound. No HSM. EXTR: No c/c/e PSYCH: Normally interactive. Conversant.    Assessment and Plan: ***  Signed Abbe Amsterdam, MD

## 2023-07-22 ENCOUNTER — Ambulatory Visit (HOSPITAL_BASED_OUTPATIENT_CLINIC_OR_DEPARTMENT_OTHER)
Admission: RE | Admit: 2023-07-22 | Discharge: 2023-07-22 | Disposition: A | Discharge status: Home / Self Care | Payer: Medicare Other | Source: Ambulatory Visit | Attending: Family Medicine | Admitting: Family Medicine

## 2023-07-22 ENCOUNTER — Ambulatory Visit: Payer: Self-pay

## 2023-07-22 ENCOUNTER — Encounter: Payer: Self-pay | Admitting: Family Medicine

## 2023-07-22 ENCOUNTER — Ambulatory Visit (INDEPENDENT_AMBULATORY_CARE_PROVIDER_SITE_OTHER): Payer: Medicare Other | Admitting: Family Medicine

## 2023-07-22 VITALS — BP 130/72 | HR 84 | Temp 97.6°F | Resp 18 | Ht 70.0 in | Wt 203.2 lb

## 2023-07-22 DIAGNOSIS — Z79899 Other long term (current) drug therapy: Secondary | ICD-10-CM

## 2023-07-22 DIAGNOSIS — Z5181 Encounter for therapeutic drug level monitoring: Secondary | ICD-10-CM

## 2023-07-22 DIAGNOSIS — Z7901 Long term (current) use of anticoagulants: Secondary | ICD-10-CM | POA: Diagnosis not present

## 2023-07-22 DIAGNOSIS — E876 Hypokalemia: Secondary | ICD-10-CM

## 2023-07-22 DIAGNOSIS — R6883 Chills (without fever): Secondary | ICD-10-CM

## 2023-07-22 DIAGNOSIS — R29898 Other symptoms and signs involving the musculoskeletal system: Secondary | ICD-10-CM | POA: Diagnosis not present

## 2023-07-22 LAB — BASIC METABOLIC PANEL
BUN: 13 mg/dL (ref 6–23)
CO2: 30 meq/L (ref 19–32)
Calcium: 9.2 mg/dL (ref 8.4–10.5)
Chloride: 102 meq/L (ref 96–112)
Creatinine, Ser: 0.82 mg/dL (ref 0.40–1.50)
GFR: 79.39 mL/min (ref >60.00)
Glucose, Bld: 98 mg/dL (ref 70–99)
Potassium: 3.2 meq/L — ABNORMAL LOW (ref 3.5–5.1)
Sodium: 140 meq/L (ref 135–145)

## 2023-07-22 LAB — CBC
HCT: 42.7 % (ref 39.0–52.0)
Hemoglobin: 14.4 g/dL (ref 13.0–17.0)
MCHC: 33.9 g/dL (ref 30.0–36.0)
MCV: 96.5 fL (ref 78.0–100.0)
Platelets: 176 10*3/uL (ref 150.0–400.0)
RBC: 4.42 Mil/uL (ref 4.22–5.81)
RDW: 13.4 % (ref 11.5–15.5)
WBC: 6.2 10*3/uL (ref 4.0–10.5)

## 2023-07-22 LAB — PROTIME-INR
INR: 2.9 {ratio} — ABNORMAL HIGH (ref 0.8–1.0)
Prothrombin Time: 28.9 s — ABNORMAL HIGH (ref 9.6–13.1)

## 2023-07-22 MED ORDER — POTASSIUM CHLORIDE CRYS ER 20 MEQ PO TBCR: 20.0000 meq | EXTENDED_RELEASE_TABLET | ORAL | 2 refills | Status: DC

## 2023-07-22 NOTE — Progress Notes (Signed)
Pt had apt with PCP today and lab INR was drawn.  Continue 1/2 tablet daily except take 1 tablet on Tuesdays, Thursdays and Saturdays. Recheck in 2 week. Contacted pt by phone and advised of result, dosing and rescheduled next coumadin clinic apt for 12/20. Pt verbalized understanding.

## 2023-07-22 NOTE — Addendum Note (Signed)
Addended by: Abbe Amsterdam C on: 07/22/2023 10:11 AM   Modules accepted: Orders

## 2023-07-22 NOTE — Patient Instructions (Addendum)
Pre visit review using our clinic review tool, if applicable. No additional management support is needed unless otherwise documented below in the visit note.  Continue 1/2 tablet daily except take 1 tablet on Tuesdays, Thursdays and Saturdays. Recheck in 2 week.

## 2023-07-22 NOTE — Addendum Note (Signed)
Addended by: Mervin Kung A on: 07/22/2023 10:54 AM   Modules accepted: Orders

## 2023-07-22 NOTE — Patient Instructions (Addendum)
It was great to see you again today- I will be in touch with your urine culture and your chest x-ray, and your labs asap Please let me know if any more episodes of chills or other concerns come up

## 2023-07-23 LAB — URINE CULTURE
MICRO NUMBER:: 15807784
Result:: NO GROWTH
SPECIMEN QUALITY:: ADEQUATE

## 2023-07-24 ENCOUNTER — Encounter: Payer: Self-pay | Admitting: Family Medicine

## 2023-07-24 NOTE — Telephone Encounter (Signed)
Message sent to Dr Patsy Lager on the Correct pts chart.

## 2023-07-28 ENCOUNTER — Encounter: Payer: Self-pay | Admitting: Family Medicine

## 2023-07-31 ENCOUNTER — Ambulatory Visit: Payer: Medicare Other

## 2023-08-07 ENCOUNTER — Ambulatory Visit (INDEPENDENT_AMBULATORY_CARE_PROVIDER_SITE_OTHER): Payer: Medicare Other

## 2023-08-07 ENCOUNTER — Other Ambulatory Visit (INDEPENDENT_AMBULATORY_CARE_PROVIDER_SITE_OTHER): Payer: Medicare Other

## 2023-08-07 DIAGNOSIS — Z5181 Encounter for therapeutic drug level monitoring: Secondary | ICD-10-CM

## 2023-08-07 DIAGNOSIS — E876 Hypokalemia: Secondary | ICD-10-CM | POA: Diagnosis not present

## 2023-08-07 DIAGNOSIS — R350 Frequency of micturition: Secondary | ICD-10-CM

## 2023-08-07 DIAGNOSIS — Z7901 Long term (current) use of anticoagulants: Secondary | ICD-10-CM

## 2023-08-07 LAB — BASIC METABOLIC PANEL
BUN: 11 mg/dL (ref 6–23)
CO2: 28 meq/L (ref 19–32)
Calcium: 9 mg/dL (ref 8.4–10.5)
Chloride: 102 meq/L (ref 96–112)
Creatinine, Ser: 0.65 mg/dL (ref 0.40–1.50)
GFR: 85.14 mL/min (ref >60.00)
Glucose, Bld: 87 mg/dL (ref 70–99)
Potassium: 3.5 meq/L (ref 3.5–5.1)
Sodium: 139 meq/L (ref 135–145)

## 2023-08-07 LAB — URINALYSIS, MICROSCOPIC ONLY

## 2023-08-07 LAB — POCT INR: INR: 2.2 (ref 2.0–3.0)

## 2023-08-07 NOTE — Progress Notes (Signed)
Continue 1/2 tablet daily except take 1 tablet on Tuesdays, Thursdays and Saturdays. Recheck in 4 week.

## 2023-08-07 NOTE — Patient Instructions (Addendum)
Pre visit review using our clinic review tool, if applicable. No additional management support is needed unless otherwise documented below in the visit note.  Continue 1/2 tablet daily except take 1 tablet on Tuesdays, Thursdays and Saturdays. Recheck in 4 week.

## 2023-08-08 ENCOUNTER — Other Ambulatory Visit: Payer: Self-pay | Admitting: Family Medicine

## 2023-08-08 DIAGNOSIS — K219 Gastro-esophageal reflux disease without esophagitis: Secondary | ICD-10-CM

## 2023-08-08 LAB — URINE CULTURE: Result:: NO GROWTH

## 2023-08-12 ENCOUNTER — Other Ambulatory Visit: Payer: Self-pay | Admitting: Family Medicine

## 2023-08-12 DIAGNOSIS — E785 Hyperlipidemia, unspecified: Secondary | ICD-10-CM

## 2023-08-18 ENCOUNTER — Other Ambulatory Visit: Payer: Self-pay | Admitting: Family Medicine

## 2023-08-18 ENCOUNTER — Encounter: Payer: Self-pay | Admitting: Family Medicine

## 2023-08-18 DIAGNOSIS — I10 Essential (primary) hypertension: Secondary | ICD-10-CM

## 2023-08-19 HISTORY — PX: OTHER SURGICAL HISTORY: SHX169

## 2023-08-21 ENCOUNTER — Encounter: Payer: Self-pay | Admitting: Family Medicine

## 2023-08-24 ENCOUNTER — Ambulatory Visit: Payer: Medicare Other | Admitting: Diagnostic Neuroimaging

## 2023-08-27 ENCOUNTER — Other Ambulatory Visit: Payer: Self-pay | Admitting: Family Medicine

## 2023-09-04 ENCOUNTER — Ambulatory Visit (INDEPENDENT_AMBULATORY_CARE_PROVIDER_SITE_OTHER): Payer: Medicare Other

## 2023-09-04 DIAGNOSIS — Z7901 Long term (current) use of anticoagulants: Secondary | ICD-10-CM

## 2023-09-04 LAB — POCT INR: INR: 2.1 (ref 2.0–3.0)

## 2023-09-04 NOTE — Progress Notes (Signed)
 Continue 1/2 tablet daily except take 1 tablet on Tuesdays, Thursdays and Saturdays. Recheck in 4 week.

## 2023-09-04 NOTE — Patient Instructions (Addendum)
 Pre visit review using our clinic review tool, if applicable. No additional management support is needed unless otherwise documented below in the visit note.  Continue 1/2 tablet daily except take 1 tablet on Tuesdays, Thursdays and Saturdays. Recheck in 4 week.

## 2023-09-10 ENCOUNTER — Encounter: Payer: Self-pay | Admitting: Family Medicine

## 2023-09-19 ENCOUNTER — Other Ambulatory Visit: Payer: Self-pay | Admitting: Family Medicine

## 2023-09-19 DIAGNOSIS — M62838 Other muscle spasm: Secondary | ICD-10-CM

## 2023-09-21 ENCOUNTER — Other Ambulatory Visit: Payer: Self-pay | Admitting: Family Medicine

## 2023-09-22 ENCOUNTER — Other Ambulatory Visit: Payer: Self-pay | Admitting: Family Medicine

## 2023-09-22 ENCOUNTER — Encounter: Payer: Self-pay | Admitting: Family Medicine

## 2023-09-22 DIAGNOSIS — I10 Essential (primary) hypertension: Secondary | ICD-10-CM

## 2023-09-23 NOTE — Progress Notes (Signed)
 [facility] [address] [place], [place] [address] [phone]  Date:  09/24/2023   Name:  Patrick Hatfield   DOB:  12/28/1934   MRN:  [id number]  PCP:  Watt Harlene BROCKS, MD    Chief Complaint: Pain (Pt states he's had pain in the bilateral rib cage x 2 weeks. No cough, no fever, no other sxs. )   History of Present Illness:  Patrick Hatfield is a 89 y.o. very pleasant male patient who presents with the following:  Patient seen today with concern of pain in his ribs/sides- history of factor V Leiden, DVT, mesenteric vein thrombosis currently on Coumadin , essential tremor, hypertension, hyperlipidemia.  Most recent visit with myself was 2 months ago when Patrick Hatfield was feeling ill with chills  He reached out to me yesterday as follows and we made this appointment Dr. Watt I have been experiencing low to dull moderate pain in my rib cage on both sides for at least ten days. I thought it may be gas or just something temporary, but it isn't going away. It feels like someone has punched me in the ribs on both sides, although I haven't had an accident of any kind. There is swelling on both sides just underneath the ribs I had not noticed before. I'm not having any additional symptoms with it like nausea, fever, etc. It hasn't kept me awake at night and it isn't constant. It's not going away so I thought maybe you should take a look at it or give me advice. 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   Unspecified adverse effect of unspecified drug, medicinal and biological substance 01/09/2012   Barrett's esophagus 03/24/2011   Factor 5 Leiden mutation, heterozygous (HCC) 03/21/2011   Essential tremor 04/24/2009   Hyperlipidemia 10/03/2008   Essential hypertension 10/03/2008   GERD 10/03/2008   Fasting hyperglycemia 10/03/2008   DVT, HX OF 10/03/2008    Past Medical History:  Diagnosis Date   Anxiety    Arthritis    KNEES,ANKLES,HANDS   Asthma    AS A TEENAGER   Barrett's esophagus    Benign essential tremor    Clotting disorder (HCC)    FACTOR 5   Colitis in 20's   Colon polyps    Congenital deficiency of other clotting factors    factor 5  Depression 2006   drug overdose   Diverticulosis    DVT (deep venous thrombosis) (HCC) 1991 & 2010   X 2 in context Factor 5 def   Esophageal reflux    Factor 5 Leiden mutation, heterozygous (HCC)    Gilbert's syndrome    H/O ischemic bowel disease 2010   while off warfarin    Herpes zoster 2010   facial   Hiatal hernia    Hx of adenomatous colonic polyps 12/14/2014   Hyperlipidemia    Hypertension    Insomnia    Osteoporosis    Personal history of colonic polyps 05/28/2007   adenomatous   Proteinuria age 51   Thrombosis of mesenteric vein (HCC) 07/12/2013   03/2009   Umbilical hernia     Past Surgical History:  Procedure  Laterality Date   CHOLECYSTECTOMY  05/16/2011    Dr Curvin   COLONOSCOPY     Dr Jakie   CYSTOSCOPY     Neg   EYE SURGERY Bilateral 10/16/2017   secondary catarct surgery.   KNEE ARTHROSCOPY Bilateral 2008, 2012   Dr.Aplington bilateral   TONSILLECTOMY     TRANSURETHRAL RESECTION OF PROSTATE      BPH;Dr Dahlstedt    Social History   Tobacco Use   Smoking status: Former    Current packs/day: 0.00    Average packs/day: 2.0 packs/day for 40.0 years (80.0 ttl pk-yrs)    Types: Cigarettes    Start date: 08/18/1954    Quit date: 08/18/1994    Years since quitting: 29.1   Smokeless tobacco: Never   Tobacco comments:    smoked 1956-1996, up to 2 ppd  Vaping Use   Vaping status: Never Used  Substance Use Topics   Alcohol use: Yes    Alcohol/week: 3.0 standard drinks of alcohol    Types: 3 Cans of beer per week   Drug use: No    Family History  Problem Relation Age of Onset   Pancreatic cancer Father 93   Ulcers Father    Stroke Father 24   Diabetes Father    Coronary artery disease Mother    Osteoporosis Mother    HIV Brother    Multiple sclerosis Sister    Heart attack Paternal Grandfather        early 30s   Heart attack Paternal Uncle        2 uncles in early 11s   Aortic aneurysm Brother    Colon cancer Neg Hx    Esophageal cancer Neg Hx    Stomach cancer Neg Hx     Allergies  Allergen Reactions   Amlodipine Besy-Benazepril Hcl     REACTION: swelling of feet.  Probably  from Amlodipine component)   Lipitor [Atorvastatin Calcium]     ? Reaction, ? Swelling=- update 7/20.  Pt recalls his legs hurt and perhaps swelled slightly - JC    Medication list has been reviewed and updated.  Current Outpatient Medications on File Prior to Visit  Medication Sig Dispense Refill   acetaminophen  (TYLENOL ) 500 MG tablet Take 1,000 mg by mouth 2 (two) times daily as needed for moderate pain, fever or headache.     cholecalciferol (VITAMIN D) 1000 UNITS tablet Take 1,000  Units by mouth daily.     diazepam  (VALIUM ) 5 MG tablet TAKE 1/2 TO 1 TABLET BY MOUTH AT BEDTIME AS NEEDED FOR SLEEP 30 tablet 1   folic acid (FOLVITE) 400 MCG tablet Take 400 mcg by mouth daily.  hydrochlorothiazide  (MICROZIDE ) 12.5 MG capsule Take 1 capsule (12.5 mg total) by mouth daily. 90 capsule 1   losartan -hydrochlorothiazide  (HYZAAR) 100-12.5 MG tablet Take 1 tablet by mouth daily. 90 tablet 1   Omega-3 Fatty Acids (FISH OIL PO) Take 1,400 mg by mouth daily.     omeprazole  (PRILOSEC) 40 MG capsule Take 1 capsule (40 mg total) by mouth daily as needed. 90 capsule 1   potassium chloride  SA (KLOR-CON  M) 20 MEQ tablet Take 1 tablet (20 mEq total) by mouth every other day. 30 tablet 2   primidone  (MYSOLINE ) 50 MG tablet Take 2 tablets (100 mg total) by mouth at bedtime. For tremor 180 tablet 2   propranolol  ER (INDERAL  LA) 60 MG 24 hr capsule Take 1 capsule (60 mg total) by mouth daily. 30 capsule 12   simvastatin  (ZOCOR ) 10 MG tablet TAKE 1 TABLET BY MOUTH EVERYDAY AT BEDTIME 90 tablet 0   warfarin (COUMADIN ) 5 MG tablet TAKE 1/2 TABLET BY MOUTH DAILY EXCEPT TAKE 1 TABLET ON SATURDAYS OR AS DIRECTED BY ANTICOAGULATION CLINIC 90 tablet 1   No current facility-administered medications on file prior to visit.    Review of Systems:  As per HPI- otherwise negative.   Physical Examination: Vitals:   09/24/23 0955  BP: 132/80  Pulse: 67  Resp: 18  Temp: 98.2 F (36.8 C)  SpO2: 98%   Vitals:   09/24/23 0955  Weight: 201 lb (91.2 kg)  Height: 5' 10 (1.778 m)   Body mass index is 28.84 kg/m. Ideal Body Weight: Weight in (lb) to have BMI = 25: 173.9  GEN: no acute distress.  Mildly overweight, looks well HEENT: Atraumatic, Normocephalic.  Ears and Nose: No external deformity. CV: RRR, No M/G/R. No JVD. No thrill. No extra heart sounds. PULM: CTA B, no wheezes, crackles, rhonchi. No retractions. No resp. distress. No accessory muscle use. ABD: S, NT, ND, +BS. No rebound. No  HSM.  Belly is benign on exam.  I am not able to reproduce discomfort EXTR: No c/c/e PSYCH: Normally interactive. Conversant.   EKG: SR, compared with tracing from 2021 no significant change noted  Assessment and Plan: Abdominal pain, unspecified abdominal location - Plan: DG Abd Acute W/Chest, EKG 12-Lead, CBC, Comprehensive metabolic panel, Lipase, Sedimentation rate, C-reactive protein, Troponin I (High Sensitivity)  Patient seen today with nonspecific discomfort in his bilateral abdomen.  Exam today is normal.  I did do some cardiac workup given unusual nature of pain, EKG is reassuring.  Troponin pending Will obtain additional blood work and labs as detailed above Have asked him to let me know if anything is getting worse Signed Harlene Schroeder, MD  Received labs and x-ray reports, message to patient  Results for orders placed or performed in visit on 09/24/23  CBC   Collection Time: 09/24/23 10:57 AM  Result Value Ref Range   WBC 6.3 4.0 - 10.5 K/uL   RBC 5.23 4.22 - 5.81 Mil/uL   Platelets 123.0 (L) 150.0 - 400.0 K/uL   Hemoglobin 16.3 13.0 - 17.0 g/dL   HCT 50.7 60.9 - 47.9 %   MCV 94.1 78.0 - 100.0 fl   MCHC 33.2 30.0 - 36.0 g/dL   RDW 86.2 88.4 - 84.4 %  Comprehensive metabolic panel   Collection Time: 09/24/23 10:57 AM  Result Value Ref Range   Sodium 136 135 - 145 mEq/L   Potassium 4.4 3.5 - 5.1 mEq/L   Chloride 97 96 - 112 mEq/L   CO2 29 19 -  32 mEq/L   Glucose, Bld 108 (H) 70 - 99 mg/dL   BUN 14 6 - 23 mg/dL   Creatinine, Ser 9.19 0.40 - 1.50 mg/dL   Total Bilirubin 1.6 (H) 0.2 - 1.2 mg/dL   Alkaline Phosphatase 74 39 - 117 U/L   AST 26 0 - 37 U/L   ALT 28 0 - 53 U/L   Total Protein 8.0 6.0 - 8.3 g/dL   Albumin 4.5 3.5 - 5.2 g/dL   GFR 20.10 >39.99 mL/min   Calcium 9.5 8.4 - 10.5 mg/dL  Lipase   Collection Time: 09/24/23 10:57 AM  Result Value Ref Range   Lipase 20.0 11.0 - 59.0 U/L  Sedimentation rate   Collection Time: 09/24/23 10:57 AM  Result  Value Ref Range   Sed Rate 2 0 - 20 mm/hr  C-reactive protein   Collection Time: 09/24/23 10:57 AM  Result Value Ref Range   CRP <1.0 0.5 - 20.0 mg/dL  Troponin I (High Sensitivity)   Collection Time: 09/24/23 10:57 AM  Result Value Ref Range   High Sens Troponin I 9 2 - 17 ng/L    DG Abd Acute W/Chest Result Date: 09/24/2023 CLINICAL DATA:  Bilateral lower rib pain.  No known injury. EXAM: DG ABDOMEN ACUTE WITH 1 VIEW CHEST COMPARISON:  07/22/2023 FINDINGS: Prior cholecystectomy. The bowel gas pattern is normal. There is no evidence of free intraperitoneal air. No suspicious radio-opaque calculi or other significant radiographic abnormality is seen. Heart size and mediastinal contours are within normal limits. Both lungs are clear. Aortic atherosclerosis. No visible displaced rib fracture. IMPRESSION: No acute findings. Electronically Signed   By: Franky Crease M.D.   On: 09/24/2023 12:01

## 2023-09-24 ENCOUNTER — Ambulatory Visit (INDEPENDENT_AMBULATORY_CARE_PROVIDER_SITE_OTHER): Payer: Medicare Other | Admitting: Family Medicine

## 2023-09-24 ENCOUNTER — Encounter: Payer: Self-pay | Admitting: Family Medicine

## 2023-09-24 ENCOUNTER — Ambulatory Visit (HOSPITAL_BASED_OUTPATIENT_CLINIC_OR_DEPARTMENT_OTHER)
Admission: RE | Admit: 2023-09-24 | Discharge: 2023-09-24 | Disposition: A | Discharge status: Home / Self Care | Payer: Medicare Other | Source: Ambulatory Visit | Attending: Family Medicine | Admitting: Family Medicine

## 2023-09-24 VITALS — BP 132/80 | HR 67 | Temp 98.2°F | Resp 18 | Ht 70.0 in | Wt 201.0 lb

## 2023-09-24 DIAGNOSIS — R0781 Pleurodynia: Secondary | ICD-10-CM | POA: Diagnosis not present

## 2023-09-24 DIAGNOSIS — R109 Unspecified abdominal pain: Secondary | ICD-10-CM

## 2023-09-24 DIAGNOSIS — I700 Atherosclerosis of aorta: Secondary | ICD-10-CM | POA: Diagnosis not present

## 2023-09-24 LAB — COMPREHENSIVE METABOLIC PANEL
ALT: 28 U/L (ref 0–53)
AST: 26 U/L (ref 0–37)
Albumin: 4.5 g/dL (ref 3.5–5.2)
Alkaline Phosphatase: 74 U/L (ref 39–117)
BUN: 14 mg/dL (ref 6–23)
CO2: 29 meq/L (ref 19–32)
Calcium: 9.5 mg/dL (ref 8.4–10.5)
Chloride: 97 meq/L (ref 96–112)
Creatinine, Ser: 0.8 mg/dL (ref 0.40–1.50)
GFR: 79.89 mL/min (ref >60.00)
Glucose, Bld: 108 mg/dL — ABNORMAL HIGH (ref 70–99)
Potassium: 4.4 meq/L (ref 3.5–5.1)
Sodium: 136 meq/L (ref 135–145)
Total Bilirubin: 1.6 mg/dL — ABNORMAL HIGH (ref 0.2–1.2)
Total Protein: 8 g/dL (ref 6.0–8.3)

## 2023-09-24 LAB — CBC
HCT: 49.2 % (ref 39.0–52.0)
Hemoglobin: 16.3 g/dL (ref 13.0–17.0)
MCHC: 33.2 g/dL (ref 30.0–36.0)
MCV: 94.1 fL (ref 78.0–100.0)
Platelets: 123 10*3/uL — ABNORMAL LOW (ref 150.0–400.0)
RBC: 5.23 Mil/uL (ref 4.22–5.81)
RDW: 13.7 % (ref 11.5–15.5)
WBC: 6.3 10*3/uL (ref 4.0–10.5)

## 2023-09-24 LAB — LIPASE: Lipase: 20 U/L (ref 11.0–59.0)

## 2023-09-24 LAB — SEDIMENTATION RATE: Sed Rate: 2 mm/h (ref 0–20)

## 2023-09-24 LAB — C-REACTIVE PROTEIN: CRP: <1 mg/dL (ref 0.5–20.0)

## 2023-09-24 LAB — TROPONIN I (HIGH SENSITIVITY): High Sens Troponin I: 9 ng/L (ref 2–17)

## 2023-09-24 NOTE — Patient Instructions (Signed)
 It was good to see you today- please go to lab and then to the ground floor imaging department.  I will be in touch with your labs and x-rays asap Please let me know if anything is changing or getting worse

## 2023-10-02 ENCOUNTER — Ambulatory Visit: Payer: Medicare Other

## 2023-10-02 DIAGNOSIS — Z7901 Long term (current) use of anticoagulants: Secondary | ICD-10-CM

## 2023-10-02 LAB — POCT INR: INR: 2.3 (ref 2.0–3.0)

## 2023-10-02 NOTE — Progress Notes (Signed)
Continue 1/2 tablet daily except take 1 tablet on Tuesdays, Thursdays and Saturdays. Recheck in 4 week.

## 2023-10-02 NOTE — Patient Instructions (Addendum)
Pre visit review using our clinic review tool, if applicable. No additional management support is needed unless otherwise documented below in the visit note.  Continue 1/2 tablet daily except take 1 tablet on Tuesdays, Thursdays and Saturdays. Recheck in 4 week.

## 2023-10-30 ENCOUNTER — Ambulatory Visit: Payer: Medicare Other

## 2023-10-30 ENCOUNTER — Encounter: Payer: Self-pay | Admitting: Family Medicine

## 2023-11-03 ENCOUNTER — Telehealth: Payer: Self-pay

## 2023-11-03 NOTE — Telephone Encounter (Signed)
 Contacted pt again and he reports his wife had a TIA and this is why he missed his coumadin clinic apt. He reports he needs to look at his wife's PT schedule and he will return call to coumadin clinic to RS his missed apt.

## 2023-11-03 NOTE — Telephone Encounter (Signed)
 Pt NS coumadin clinic apt on 3/14. Contacted pt to RS apt but he reported it was not a good time due to him being at an apt with his wife. He requested to call back this afternoon.

## 2023-11-04 ENCOUNTER — Encounter: Payer: Self-pay | Admitting: Family Medicine

## 2023-11-04 NOTE — Telephone Encounter (Signed)
 Pt returned call to RS coumadin clinic apt.

## 2023-11-04 NOTE — Telephone Encounter (Signed)
 Looks like this appt is with [facility]:  Im not sure that we can cancel this for him.  Name: Patrick Hatfield, Patrick Hatfield" MRN: [id number]  Date: 11/10/2023 Status: Sch  Time: 1:30 PM Length: 15  Visit Type: MYCHART VIDEO VISIT [2130] Copay: $20.00  Provider: Suanne Marker, MD Department: [facility]

## 2023-11-10 ENCOUNTER — Telehealth: Payer: Medicare Other | Admitting: Diagnostic Neuroimaging

## 2023-11-10 ENCOUNTER — Ambulatory Visit (INDEPENDENT_AMBULATORY_CARE_PROVIDER_SITE_OTHER)

## 2023-11-10 DIAGNOSIS — Z7901 Long term (current) use of anticoagulants: Secondary | ICD-10-CM | POA: Diagnosis not present

## 2023-11-10 LAB — POCT INR: INR: 1.6 — AB (ref 2.0–3.0)

## 2023-11-10 NOTE — Progress Notes (Signed)
 Pt has been under a lot of stress lately due to wife have recent TIA. Pt missed two doses in last 2 weeks due to taking care of wife. Increase dose today to take 1 1/2 tablets and increase dose tomorrow to take 1 tablet and then continue 1/2 tablet daily except take 1 tablet on Tuesdays, Thursdays and Saturdays. Recheck in 3 week.

## 2023-11-10 NOTE — Patient Instructions (Addendum)
 Pre visit review using our clinic review tool, if applicable. No additional management support is needed unless otherwise documented below in the visit note.  Increase dose today to take 1 1/2 tablets and increase dose tomorrow to take 1 tablet and then continue 1/2 tablet daily except take 1 tablet on Tuesdays, Thursdays and Saturdays. Recheck in 3 week.

## 2023-12-01 ENCOUNTER — Ambulatory Visit (INDEPENDENT_AMBULATORY_CARE_PROVIDER_SITE_OTHER)

## 2023-12-01 DIAGNOSIS — Z7901 Long term (current) use of anticoagulants: Secondary | ICD-10-CM | POA: Diagnosis not present

## 2023-12-01 LAB — POCT INR: INR: 2 (ref 2.0–3.0)

## 2023-12-01 NOTE — Progress Notes (Signed)
 Continue 1/2 tablet daily except take 1 tablet on Tuesdays, Thursdays and Saturdays. Recheck in 4 week.

## 2023-12-01 NOTE — Patient Instructions (Addendum)
 Pre visit review using our clinic review tool, if applicable. No additional management support is needed unless otherwise documented below in the visit note.  Continue 1/2 tablet daily except take 1 tablet on Tuesdays, Thursdays and Saturdays. Recheck in 4 week.

## 2023-12-03 ENCOUNTER — Other Ambulatory Visit: Payer: Self-pay | Admitting: Family Medicine

## 2023-12-03 DIAGNOSIS — E785 Hyperlipidemia, unspecified: Secondary | ICD-10-CM

## 2023-12-04 ENCOUNTER — Encounter: Payer: Self-pay | Admitting: Family Medicine

## 2023-12-04 DIAGNOSIS — E785 Hyperlipidemia, unspecified: Secondary | ICD-10-CM

## 2023-12-05 MED ORDER — SIMVASTATIN 10 MG PO TABS: 10.0000 mg | ORAL_TABLET | Freq: Every day | ORAL | 3 refills | Status: AC

## 2023-12-15 ENCOUNTER — Encounter: Payer: Self-pay | Admitting: Family Medicine

## 2023-12-29 ENCOUNTER — Ambulatory Visit (INDEPENDENT_AMBULATORY_CARE_PROVIDER_SITE_OTHER)

## 2023-12-29 ENCOUNTER — Other Ambulatory Visit: Payer: Self-pay | Admitting: Family Medicine

## 2023-12-29 DIAGNOSIS — Z7901 Long term (current) use of anticoagulants: Secondary | ICD-10-CM

## 2023-12-29 DIAGNOSIS — E876 Hypokalemia: Secondary | ICD-10-CM

## 2023-12-29 LAB — POCT INR: INR: 1.4 — AB (ref 2.0–3.0)

## 2023-12-29 MED ORDER — WARFARIN SODIUM 5 MG PO TABS: ORAL_TABLET | ORAL | 1 refills | Status: DC

## 2023-12-29 NOTE — Progress Notes (Signed)
 Pt reports since his wife's stroke he has been incorporating more fresh vegetables into their diet. Fresh vegetables can contain higher amounts of vitamin K. Pt reports he has now incorporated [employer] into their diet. This is very high in vitamin K (45% DV) Increase dose today to take 1 1/2 tablets and increase dose tomorrow to take 1 tablet and then continue 1/2 tablet daily except take 1 tablet on Tuesdays, Thursdays and Saturdays. Recheck in 1 week.  Pt is compliant with warfarin management and PCP apts.  Sent in refill of warfarin to requested pharmacy.

## 2023-12-29 NOTE — Patient Instructions (Addendum)
 Pre visit review using our clinic review tool, if applicable. No additional management support is needed unless otherwise documented below in the visit note.  Increase dose today to take 1 1/2 tablets and increase dose tomorrow to take 1 tablet and then continue 1/2 tablet daily except take 1 tablet on Tuesdays, Thursdays and Saturdays. Recheck in 1 week.

## 2024-01-04 DIAGNOSIS — M5459 Other low back pain: Secondary | ICD-10-CM | POA: Diagnosis not present

## 2024-01-04 DIAGNOSIS — M791 Myalgia, unspecified site: Secondary | ICD-10-CM | POA: Diagnosis not present

## 2024-01-05 ENCOUNTER — Ambulatory Visit (INDEPENDENT_AMBULATORY_CARE_PROVIDER_SITE_OTHER)

## 2024-01-05 DIAGNOSIS — Z7901 Long term (current) use of anticoagulants: Secondary | ICD-10-CM

## 2024-01-05 LAB — POCT INR: INR: 1.7 — AB (ref 2.0–3.0)

## 2024-01-05 NOTE — Progress Notes (Cosign Needed Addendum)
 Pt reports since his wife's stroke he has been incorporating more fresh vegetables into their diet. Fresh vegetables can contain higher amounts of vitamin K. Pt reports he has now incorporated [employer] into their diet. This is very high in vitamin K (45% DV) Increase dose today to take 1 1/2 tablets and then change weekly dose to take 1 tablet daily except take 1/2 tablet on Monday, Wednesday and Friday. Recheck in 3 week.  Medical screening examination/treatment/procedure(s) were performed by non-physician practitioner and as supervising physician I was immediately available for consultation/collaboration.  I agree with above. [name], MD

## 2024-01-05 NOTE — Patient Instructions (Addendum)
 Pre visit review using our clinic review tool, if applicable. No additional management support is needed unless otherwise documented below in the visit note.  Increase dose today to take 1 1/2 tablets and then change weekly dose to take 1 tablet daily except take 1/2 tablet on Monday, Wednesday and Friday. Recheck in 3 week.

## 2024-01-14 ENCOUNTER — Ambulatory Visit: Payer: Self-pay

## 2024-01-14 ENCOUNTER — Encounter: Payer: Self-pay | Admitting: Family Medicine

## 2024-01-14 NOTE — Telephone Encounter (Signed)
 Chief Complaint:  pain Symptoms: pain Frequency: 1.5 weeks  Pertinent Negatives: Patient denies fever Disposition: [] ED /[] Urgent Care (no appt availability in office) / [x] Appointment(In office/virtual)/ []  [facility]/ [] Home Care/ [] Refused Recommended Disposition /[] [facility]/ []  Follow-up with PCP Additional Notes:  Dull pain right sided abdomen just above his hip and now radiating to mid back. 1.5 weeks of dull pain. History of kidney infections and "this feels classic". Denies all other symptoms. Acute evaluation advised and scheduled with an alternate provider on 01/15/24. Educated on care advice as documented in protocol, patient verbalized understanding. Discussed reasons to call back.    Copied from CRM [id number]. Topic: Clinical - Red Word Triage >> Jan 14, 2024 10:34 AM Alethia Huxley E wrote: [facility] that prompted transfer to Nurse Triage: Potential kidney infection. Patient states that he has a dull pain on the right side of his abdomen above hip. Patient thinks it might be a kidney infection. Reason for Disposition  [1] MILD pain (e.g., does not interfere with normal activities) AND [2] pain comes and goes (cramps) [3] present > 48 hours  (Exception: This same abdominal pain is a chronic symptom recurrent or ongoing AND present > 4 weeks.)  Protocols used: Abdominal Pain - Male-A-AH

## 2024-01-15 ENCOUNTER — Encounter: Payer: Self-pay | Admitting: Family

## 2024-01-15 ENCOUNTER — Ambulatory Visit (INDEPENDENT_AMBULATORY_CARE_PROVIDER_SITE_OTHER): Admitting: Family

## 2024-01-15 VITALS — BP 123/75 | HR 65 | Ht 70.0 in | Wt 200.4 lb

## 2024-01-15 DIAGNOSIS — R109 Unspecified abdominal pain: Secondary | ICD-10-CM

## 2024-01-15 DIAGNOSIS — R350 Frequency of micturition: Secondary | ICD-10-CM

## 2024-01-15 LAB — POCT URINALYSIS DIPSTICK
Blood, UA: NEGATIVE
Glucose, UA: NEGATIVE
Ketones, UA: NEGATIVE
Nitrite, UA: NEGATIVE
Protein, UA: NEGATIVE
Spec Grav, UA: 1.015 (ref 1.010–1.025)
Urobilinogen, UA: 0.2 U/dL
pH, UA: 6 (ref 5.0–8.0)

## 2024-01-15 MED ORDER — NITROFURANTOIN MONOHYD MACRO 100 MG PO CAPS: 100.0000 mg | ORAL_CAPSULE | Freq: Two times a day (BID) | ORAL | 0 refills | Status: DC

## 2024-01-15 NOTE — Progress Notes (Signed)
 Patrick Hatfield is a 89 y.o. male with the following history as recorded in Epic[employer]:  Patient Active Problem List   Diagnosis Date Noted   Acute cough 08/05/2022   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     diazepam  (VALIUM ) 5 MG tablet TAKE 1/2 TO 1 TABLET BY MOUTH AT BEDTIME AS NEEDED FOR SLEEP 30 tablet 1   losartan -hydrochlorothiazide  (HYZAAR) 100-12.5 MG tablet Take 1 tablet by mouth daily. 90 tablet 1   Omega-3 Fatty Acids (FISH OIL PO) Take 1,400 mg by mouth daily.     omeprazole  (PRILOSEC) 40 MG capsule Take 1 capsule (40 mg total) by mouth daily as needed. 90 capsule 1   potassium chloride  SA (KLOR-CON  M) 20 MEQ tablet Take 1 tablet (20 mEq total) by mouth every other day. 45 tablet 0   primidone  (MYSOLINE ) 50 MG tablet Take 2 tablets (100 mg total) by mouth at bedtime. For tremor 180 tablet 2   propranolol  ER (INDERAL  LA) 60 MG 24 hr capsule Take 1 capsule (60 mg total) by mouth daily. 30 capsule 12   simvastatin  (ZOCOR ) 10 MG tablet Take 1 tablet (10 mg  total) by mouth daily. 90 tablet 3   warfarin (COUMADIN ) 5 MG tablet TAKE 1/2 TABLET BY MOUTH DAILY EXCEPT TAKE 1 TABLET ON TUESDAY, THURSDAY AND SATURDAYS OR AS DIRECTED BY ANTICOAGULATION CLINIC 90 tablet 1   nitrofurantoin , macrocrystal-monohydrate, (MACROBID ) 100 MG capsule Take 1 capsule (100 mg total) by mouth 2 (two) times daily. 14 capsule 0   No current facility-administered medications for this visit.    Allergies: Amlodipine besy-benazepril hcl and Lipitor [atorvastatin calcium]  Past Medical History:  Diagnosis Date   Anxiety    Arthritis    KNEES,ANKLES,HANDS   Asthma    AS A TEENAGER   Barrett's esophagus    Benign essential tremor    Clotting disorder (HCC)    FACTOR 5   Colitis in 20's   Colon polyps    Congenital deficiency of other clotting factors    factor 5   Depression 2006   drug overdose   Diverticulosis    DVT (deep venous thrombosis) (HCC) 1991 & 2010   X 2 in context Factor 5 def   Esophageal reflux    Factor 5 Leiden mutation, heterozygous (HCC)    Gilbert's syndrome    H/O ischemic bowel disease 2010   while off warfarin    Herpes zoster 2010   facial   Hiatal hernia    Hx of adenomatous colonic polyps 12/14/2014   Hyperlipidemia    Hypertension    Insomnia  Osteoporosis    Personal history of colonic polyps 05/28/2007   adenomatous   Proteinuria age 75   Thrombosis of mesenteric vein (HCC) 07/12/2013   03/2009   Umbilical hernia     Past Surgical History:  Procedure Laterality Date   CHOLECYSTECTOMY  05/16/2011    Dr Alethea Andes   COLONOSCOPY     Dr Adan Holms   CYSTOSCOPY     Neg   EYE SURGERY Bilateral 10/16/2017   secondary catarct surgery.   KNEE ARTHROSCOPY Bilateral 2008, 2012   Dr.Aplington bilateral   TONSILLECTOMY     TRANSURETHRAL RESECTION OF PROSTATE      BPH;Dr Dahlstedt    Family History  Problem Relation Age of Onset   Pancreatic cancer Father 33   Ulcers Father    Stroke Father 16   Diabetes Father    Coronary  artery disease Mother    Osteoporosis Mother    HIV Brother    Multiple sclerosis Sister    Heart attack Paternal Grandfather        early 83s   Heart attack Paternal Uncle        2 uncles in early 16s   Aortic aneurysm Brother    Colon cancer Neg Hx    Esophageal cancer Neg Hx    Stomach cancer Neg Hx     Social History   Tobacco Use   Smoking status: Former    Current packs/day: 0.00    Average packs/day: 2.0 packs/day for 40.0 years (80.0 ttl pk-yrs)    Types: Cigarettes    Start date: 08/18/1954    Quit date: 08/18/1994    Years since quitting: 29.4   Smokeless tobacco: Never   Tobacco comments:    smoked 1956-1996, up to 2 ppd  Substance Use Topics   Alcohol use: Yes    Alcohol/week: 3.0 standard drinks of alcohol    Types: 3 Cans of beer per week    Subjective:   2 week history of right back/ flank pain; concerned that could be kidney infection; notes that is prone to kidney infections and "this feels like that again." No frequency or blood in urine; has actually already seen his orthopedist about this episode of back pain and MRI scheduled for 2 weeks; wants to make sure MRI is really necessary;   Objective:  Vitals:   01/15/24 1434  BP: 123/75  Pulse: 65  SpO2: 97%  Weight: 200 lb 6.4 oz (90.9 kg)  Height: 5\' 10"  (1.778 m)    General: Well developed, well nourished, in no acute distress  Skin : Warm and dry.  Head: Normocephalic and atraumatic  Lungs: Respirations unlabored; clear to auscultation bilaterally without wheeze, rales, rhonchi  CVS exam: normal rate and regular rhythm.  Musculoskeletal: No deformities; no active joint inflammation  Extremities: No edema, cyanosis, clubbing  Vessels: Symmetric bilaterally  Neurologic: Alert and oriented; speech intact; face symmetrical; moves all extremities well; CNII-XII intact without focal deficit   Assessment:  1. Flank pain   2. Urinary frequency     Plan:  ? Muscular source vs urinary source; will  update U/A and urine culture- will start Macrobid  which he has tolerated well in the past with this type of symptom; if culture is normal, will stop antibiotic and he will proceed with MRI as scheduled with his orthopedist.   No follow-ups on file.  Orders Placed This Encounter  Procedures   Urine Culture   POCT Urinalysis Dipstick    Requested Prescriptions  Signed Prescriptions Disp Refills   nitrofurantoin , macrocrystal-monohydrate, (MACROBID ) 100 MG capsule 14 capsule 0    Sig: Take 1 capsule (100 mg total) by mouth 2 (two) times daily.

## 2024-01-16 LAB — URINE CULTURE
MICRO NUMBER:: 16519977
Result:: NO GROWTH
SPECIMEN QUALITY:: ADEQUATE

## 2024-01-17 ENCOUNTER — Encounter: Payer: Self-pay | Admitting: Family

## 2024-01-18 ENCOUNTER — Ambulatory Visit: Payer: Self-pay | Admitting: Family

## 2024-01-18 ENCOUNTER — Other Ambulatory Visit: Payer: Self-pay | Admitting: Family

## 2024-01-18 ENCOUNTER — Encounter: Payer: Self-pay | Admitting: Family

## 2024-01-18 DIAGNOSIS — R109 Unspecified abdominal pain: Secondary | ICD-10-CM

## 2024-01-22 ENCOUNTER — Ambulatory Visit: Payer: Self-pay | Admitting: Family

## 2024-01-22 ENCOUNTER — Ambulatory Visit
Admission: RE | Admit: 2024-01-22 | Discharge: 2024-01-22 | Disposition: A | Discharge status: Home / Self Care | Source: Ambulatory Visit | Attending: Family | Admitting: Family

## 2024-01-22 DIAGNOSIS — K5730 Diverticulosis of large intestine without perforation or abscess without bleeding: Secondary | ICD-10-CM | POA: Diagnosis not present

## 2024-01-22 DIAGNOSIS — R109 Unspecified abdominal pain: Secondary | ICD-10-CM

## 2024-01-22 DIAGNOSIS — K760 Fatty (change of) liver, not elsewhere classified: Secondary | ICD-10-CM | POA: Diagnosis not present

## 2024-01-22 MED ORDER — IOPAMIDOL (ISOVUE-300) INJECTION 61%
100.0000 mL | Freq: Once | INTRAVENOUS | Status: AC | PRN
Start: 2024-01-22 — End: 2024-01-22
  Administered 2024-01-22: 100 mL via INTRAVENOUS

## 2024-01-26 ENCOUNTER — Other Ambulatory Visit: Payer: Self-pay | Admitting: Family Medicine

## 2024-01-26 ENCOUNTER — Ambulatory Visit (INDEPENDENT_AMBULATORY_CARE_PROVIDER_SITE_OTHER)

## 2024-01-26 DIAGNOSIS — M62838 Other muscle spasm: Secondary | ICD-10-CM

## 2024-01-26 DIAGNOSIS — Z7901 Long term (current) use of anticoagulants: Secondary | ICD-10-CM | POA: Diagnosis not present

## 2024-01-26 LAB — POCT INR: INR: 2.3 (ref 2.0–3.0)

## 2024-01-26 NOTE — Patient Instructions (Addendum)
 Pre visit review using our clinic review tool, if applicable. No additional management support is needed unless otherwise documented below in the visit note.  Continue 1 tablet daily except take 1/2 tablet on Monday, Wednesday and Friday. Recheck in 3 week.

## 2024-01-26 NOTE — Progress Notes (Signed)
 Continue 1 tablet daily except take 1/2 tablet on Monday, Wednesday and Friday. Recheck in 3 week.

## 2024-02-09 ENCOUNTER — Other Ambulatory Visit: Payer: Self-pay | Admitting: Family Medicine

## 2024-02-09 ENCOUNTER — Other Ambulatory Visit: Payer: Self-pay | Admitting: Urology

## 2024-02-09 DIAGNOSIS — G250 Essential tremor: Secondary | ICD-10-CM

## 2024-02-09 DIAGNOSIS — I10 Essential (primary) hypertension: Secondary | ICD-10-CM

## 2024-02-13 DIAGNOSIS — M5459 Other low back pain: Secondary | ICD-10-CM | POA: Diagnosis not present

## 2024-02-16 ENCOUNTER — Ambulatory Visit

## 2024-02-16 DIAGNOSIS — M4807 Spinal stenosis, lumbosacral region: Secondary | ICD-10-CM | POA: Diagnosis not present

## 2024-02-16 DIAGNOSIS — Z7901 Long term (current) use of anticoagulants: Secondary | ICD-10-CM | POA: Diagnosis not present

## 2024-02-16 DIAGNOSIS — M791 Myalgia, unspecified site: Secondary | ICD-10-CM | POA: Diagnosis not present

## 2024-02-16 LAB — POCT INR: INR: 3.9 — AB (ref 2.0–3.0)

## 2024-02-16 NOTE — Patient Instructions (Addendum)
 Pre visit review using our clinic review tool, if applicable. No additional management support is needed unless otherwise documented below in the visit note.  Hold warfarin today and then change weekly dose to take 1/2 tablet daily except take 1 tablet on Sunday, Tuesday and Thursday. Recheck in 2 week.

## 2024-02-16 NOTE — Progress Notes (Signed)
 Pt had apt with ortho and will be scheduled for an epidural. He will let coumadin  clinic know when it is scheduled. Will need to complete a Lovenox  bridge schedule for the pt. Pt took Aleve yesterday and this morning. Advised pt this will increase INR. Advised if s/s of bleeding or abnormal bruising to go to ER. Pt verbalized understanding. Hold warfarin today and then change weekly dose to take 1/2 tablet daily except take 1 tablet on Sunday, Tuesday and Thursday. Recheck in 2 week.

## 2024-02-24 ENCOUNTER — Telehealth: Payer: Self-pay

## 2024-02-24 NOTE — Telephone Encounter (Signed)
 Pt reports he is scheduled for a lumbar epidural for 7/17.  Pt will need to hold warfarin for this procedure. Recommendation to bridge with Lovenox .  Anticoagulation indications; Factor V, thrombosis of mesenteric vein (2012), and DVT (1991 & 2010)  Actual wt: 90 kg Ideal wt: 78 kg (actual wt is 123% higher than ideal wt; can use actual wt for CrCl calculation) Adjusted wt: 80 kg  CrCl: 82.81 mL/min (can use once daily Lovenox  injections)  Lovenox  dose recommendation: 150 mg Q24H Current warfarin dosing: 1/2 tablet (2.5 mg) daily except take 1 tablet (5 mg) on Sunday, Tuesday and Thursday.  Lovenox  bridge;  7/12: Take last dose of warfarin 7/13: NO warfarin, NO Lovenox  7/14: NO warfarin, inject Lovenox  once in the AM 7/15: NO warfarin, inject Lovenox  once in the AM 7/16: No warfarin, inject Lovenox  once in the AM (BEFORE 7 AM)  7/17: SURGERY; NO WARFARIN, NO LOVENOX   7/18: Take 1 tablet (5 mg) warfarin, inject Lovenox  once in the AM 7/19: Take 1 tablet (5 mg) warfarin, inject Lovenox  once in the AM 7/20: Take 1 1/2 tablets (7.5 mg) warfarin, inject Lovenox  once in the AM 7/21: Take 1/2 tablet (2.5 mg) warfarin, inject Lovenox  once in the AM 7/22: Take 1 tablet (5 mg) warfarin, inject Lovenox  once in the AM 7/23: Take 1/2 tablet (2.5 mg) warfarin, inject Lovenox  once in the AM 7/24: Take 1 tablet (5 mg) warfarin, inject Lovenox  once in the AM 7/25: Recheck INR; NO WARFARIN AND NO LOVENOX  UNTIL AFTER INR CHECK

## 2024-02-25 NOTE — Patient Instructions (Addendum)
 Pre visit review using our clinic review tool, if applicable. No additional management support is needed unless otherwise documented below in the visit note.  Continue 1/2 tablet daily except take 1 tablet on Sunday, Tuesday and Thursday until starting instructions below. 7/12: Take last dose of warfarin 7/13: NO warfarin, NO Lovenox  7/14: NO warfarin, inject Lovenox  once in the AM 7/15: NO warfarin, inject Lovenox  once in the AM 7/16: No warfarin, inject Lovenox  once in the AM (BEFORE 7 AM)   7/17: SURGERY; NO WARFARIN, NO LOVENOX    7/18: Take 1 tablet (5 mg) warfarin, inject Lovenox  once in the AM 7/19: Take 1 tablet (5 mg) warfarin, inject Lovenox  once in the AM 7/20: Take 1 1/2 tablets (7.5 mg) warfarin, inject Lovenox  once in the AM 7/21: Take 1/2 tablet (2.5 mg) warfarin, inject Lovenox  once in the AM 7/22: Take 1 tablet (5 mg) warfarin, inject Lovenox  once in the AM 7/23: Take 1/2 tablet (2.5 mg) warfarin, inject Lovenox  once in the AM 7/24: Take 1 tablet (5 mg) warfarin, inject Lovenox  once in the AM 7/25: Recheck INR; NO WARFARIN AND NO LOVENOX  UNTIL AFTER INR CHECK

## 2024-02-26 ENCOUNTER — Ambulatory Visit (INDEPENDENT_AMBULATORY_CARE_PROVIDER_SITE_OTHER)

## 2024-02-26 DIAGNOSIS — Z7901 Long term (current) use of anticoagulants: Secondary | ICD-10-CM | POA: Diagnosis not present

## 2024-02-26 LAB — POCT INR: INR: 2.3 (ref 2.0–3.0)

## 2024-02-26 MED ORDER — ENOXAPARIN SODIUM 150 MG/ML IJ SOSY: 150.0000 mg | PREFILLED_SYRINGE | INTRAMUSCULAR | 0 refills | Status: DC

## 2024-02-26 NOTE — Progress Notes (Signed)
 Pt is scheduled for lumbar epidural on 7/17 and is being placed on a Lovenox  bridge. Per PCP secure chat, ok to proceed with Lovenox  bridge instructions as long as normal protocols were used to create it. Lovenox  bridge protocols were used to create it. Continue 1/2 tablet daily except take 1 tablet on Sunday, Tuesday and Thursday until starting instructions below.  7/12: Take last dose of warfarin 7/13: NO warfarin, NO Lovenox  7/14: NO warfarin, inject Lovenox  once in the AM 7/15: NO warfarin, inject Lovenox  once in the AM 7/16: No warfarin, inject Lovenox  once in the AM (BEFORE 7 AM)   7/17: SURGERY; NO WARFARIN, NO LOVENOX    7/18: Take 1 tablet (5 mg) warfarin, inject Lovenox  once in the AM 7/19: Take 1 tablet (5 mg) warfarin, inject Lovenox  once in the AM 7/20: Take 1 1/2 tablets (7.5 mg) warfarin, inject Lovenox  once in the AM 7/21: Take 1/2 tablet (2.5 mg) warfarin, inject Lovenox  once in the AM 7/22: Take 1 tablet (5 mg) warfarin, inject Lovenox  once in the AM 7/23: Take 1/2 tablet (2.5 mg) warfarin, inject Lovenox  once in the AM 7/24: Take 1 tablet (5 mg) warfarin, inject Lovenox  once in the AM 7/25: Recheck INR; NO WARFARIN AND NO LOVENOX  UNTIL AFTER INR CHECK  Sent in script for Lovenox 

## 2024-03-01 ENCOUNTER — Other Ambulatory Visit: Payer: Self-pay | Admitting: Family Medicine

## 2024-03-01 ENCOUNTER — Telehealth: Payer: Self-pay

## 2024-03-01 ENCOUNTER — Ambulatory Visit

## 2024-03-01 DIAGNOSIS — E876 Hypokalemia: Secondary | ICD-10-CM

## 2024-03-01 NOTE — Telephone Encounter (Signed)
 Pt called to inquire if he should also stop taking the fish oil supplement for his upcoming lumbar epidural. Advised fish oil is a natural anticoagulant and most surgeons want this stopped before surgery also. Advised to stop fish oil. Pt verbalized understanding.

## 2024-03-03 DIAGNOSIS — M5416 Radiculopathy, lumbar region: Secondary | ICD-10-CM | POA: Diagnosis not present

## 2024-03-03 NOTE — Telephone Encounter (Signed)
 Pt reports this morning that he arrived for his lumbar epidural and Dr. Bonner was upset that he was off of his warfarin and wanted to know why he was off and also upset because the pt did not have an INR check yesterday or this morning before the procedure.   Contacted Dr. Bonner office, [phone], and spoke to scheduler, Ronal. She reports procedure performed this morning was a select nerve root block and the lumbar epidural was cancelled due to no INR check yesterday or today. She reports that there was a request sent to PCPs office for warfarin dosing and lovenox  bridge instructions and that was returned to them before the procedure. So, pt was to be off of warfarin per Dr. Angie office. Per Dr. Angie note from 02/16/24,  Imaging: The MRI showed a little compression area of L1, but that is not the issue. and Plan: His issue is more neurological, I think, and he has a definite severe spinal stenosis at L4-L5. He is on Coumadin , and he will have to work that out with Dr. Ubaldo to come off of that and have an epidural steroid at L4-L5. I will see him after that.   But, there was no mention on that medical clearance requesting an INR the day before or the day of the procedure. She reports this was apparently an oversight at Dr. Angie office. She reports the pt has a f/u with Dr. Heide in 2 weeks and they will reassess pt at that time to see if pt still needs the lumbar epidural. Pt reports currently the injection he received is helping the pain. Pt will resume warfarin and Lovenox  injections tomorrow as instructed at last coumadin  clinic apt. Advised if any problems or changes to contact the coumadin  clinic. Pt verbalized understanding.

## 2024-03-11 ENCOUNTER — Ambulatory Visit

## 2024-03-11 DIAGNOSIS — Z7901 Long term (current) use of anticoagulants: Secondary | ICD-10-CM | POA: Diagnosis not present

## 2024-03-11 LAB — POCT INR: INR: 2.1 (ref 2.0–3.0)

## 2024-03-11 NOTE — Progress Notes (Signed)
 Pt was scheduled for a lumbar epidural on 7/17 and placed on a Lovenox  bridge. When pt arrived for the procedure they would not perform it due to him not have INR check. Pt was never advised he needed an INR check. They performed a different procedure and pt continued the Lovenox  bridge.  Stop Lovenox  injections. Continue 1/2 tablet daily except take 1 tablet on Saturday, Tuesday and Thursday. Recheck in 4 weeks.

## 2024-03-11 NOTE — Patient Instructions (Addendum)
 Pre visit review using our clinic review tool, if applicable. No additional management support is needed unless otherwise documented below in the visit note.  Stop Lovenox  injections. Continue 1/2 tablet daily except take 1 tablet on Saturday, Tuesday and Thursday. Recheck in 4 weeks.

## 2024-03-12 ENCOUNTER — Other Ambulatory Visit: Payer: Self-pay | Admitting: Family Medicine

## 2024-03-12 DIAGNOSIS — K219 Gastro-esophageal reflux disease without esophagitis: Secondary | ICD-10-CM

## 2024-03-12 DIAGNOSIS — I10 Essential (primary) hypertension: Secondary | ICD-10-CM

## 2024-03-14 ENCOUNTER — Encounter: Payer: Self-pay | Admitting: Family

## 2024-03-15 DIAGNOSIS — M545 Low back pain, unspecified: Secondary | ICD-10-CM | POA: Diagnosis not present

## 2024-03-21 ENCOUNTER — Telehealth: Payer: Self-pay | Admitting: Internal Medicine

## 2024-03-21 ENCOUNTER — Encounter: Payer: Self-pay | Admitting: Family Medicine

## 2024-03-21 NOTE — Telephone Encounter (Signed)
 Received copy of MyChart message that patient has gastrointestinal problems and is looking for a sooner appointment in October.  He did not detail what they were.  He has not been seen here in a few years.   I was not sure I would be able to communicate with him by MyChart since he has not seen us  in several years so I am asking that we please call him and do some triage as to what his problems are and then I will advise further.

## 2024-03-22 NOTE — Telephone Encounter (Signed)
 Spoke with patient. He reports that for several months he has been having loose stools with lots of gas multiple times per day. He reports it seems to have gotten worse in the last few weeks. His abdomen constantly feels bloated and distended. He has tried OTC Imodium and it has not helped him. Sooner appointment scheduled with Camie for 04/04/2024 at 2:30.

## 2024-03-25 ENCOUNTER — Encounter: Payer: Self-pay | Admitting: Family Medicine

## 2024-03-25 DIAGNOSIS — I10 Essential (primary) hypertension: Secondary | ICD-10-CM

## 2024-03-25 MED ORDER — LOSARTAN POTASSIUM-HCTZ 100-12.5 MG PO TABS: 1.0000 | ORAL_TABLET | Freq: Every day | ORAL | 3 refills | Status: DC

## 2024-03-26 ENCOUNTER — Other Ambulatory Visit: Payer: Self-pay | Admitting: Family Medicine

## 2024-03-26 DIAGNOSIS — I10 Essential (primary) hypertension: Secondary | ICD-10-CM

## 2024-03-28 ENCOUNTER — Other Ambulatory Visit: Payer: Self-pay | Admitting: Family Medicine

## 2024-03-28 ENCOUNTER — Telehealth: Payer: Self-pay

## 2024-03-28 DIAGNOSIS — I10 Essential (primary) hypertension: Secondary | ICD-10-CM

## 2024-03-28 MED ORDER — LOSARTAN POTASSIUM-HCTZ 100-25 MG PO TABS: 1.0000 | ORAL_TABLET | Freq: Every day | ORAL | 3 refills | Status: AC

## 2024-03-28 NOTE — Telephone Encounter (Signed)
 BP Readings from Last 3 Encounters:  01/15/24 123/75  09/24/23 132/80  07/22/23 130/72

## 2024-03-28 NOTE — Telephone Encounter (Signed)
 Called pt and left a VM asking pt to give the office a call back to verify with Rx pt is currently taking. E2C2 okay to discuss with pt.

## 2024-03-28 NOTE — Addendum Note (Signed)
 Addended by: WATT RAISIN C on: 03/28/2024 11:29 AM   Modules accepted: Orders

## 2024-04-03 NOTE — Progress Notes (Addendum)
 Patrick Hatfield [id number] 07-09-1935   Chief Complaint: Diarrhea, gas, bloating  Referring Provider: Watt Harlene BROCKS, MD Primary GI MD: Dr. Avram  HPI: Patrick Hatfield is a 89 y.o. male with past medical history of anxiety/depression, arthritis, asthma, Barrett's esophagus, adenomatous colon polyps, diverticulosis, history of DVT, GERD, factor V Leiden mutation, Gilbert's syndrome, history of ischemic bowel disease, hiatal hernia, HLD, HTN, osteoporosis, mesenteric vein thrombosis 2014, cholecystectomy 2012, TURP who presents today for a complaint of diarrhea, gas, bloating.    Patient last seen in office 12/28/2019 by Patrick Shawl, NP for [name] diarrhea.  Had been treated with vancomycin  125 mg 4 times daily for 2 weeks and Florastor probiotic.  Diarrhea recurred after completing 2-week course of vanc.  He was then restarted on 10-day course of vancomycin  12/26/2019.  Advised to continue current regimen for 10 days as prescribed by Dr. Avram, then Vancomycin  125 mg 1 p.o. 3 times daily for 7 days then Vancomycin  125 mg 1 p.o. twice daily for 7 days then Vancomycin  125 mg 1 p.o. daily for 7 days then Vancomycin  125 mg 1 p.o. every third day for 6 weeks # 56, no refills.  Also advised to continue Florastor for 6 to 8 weeks as tolerated.  Patient does have history of colon polyps but no further colonoscopies have been recommended due to age.  History of GERD with dilation of distal esophageal ring at time of EGD in 2016, and no Barrett's esophagus.  03/22/2024 patient endorsed several months of loose stools with frequent gas, worsening over the previous few weeks along with abdominal bloating and distention.  No improvement with OTC Imodium.   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  However, in the last 6 months he has noticed increased frequency of bowel movements and stools that are looser  than normal and even watery.  Reports he has already had 4 bowel movements today, and the first 1 woke him up from sleep at 330 this morning.  He denies any blood in his stool, and has only noticed dark stools if he takes Pepto-Bismol.  Denies any abdominal pain but states that he can tell when he is about to have diarrhea.  Reports he has had 3 episodes of incontinence in the last 2 years.  Usually is able to make it to the bathroom in time.  Watery diarrhea does not occur every day but seems to have become more frequent over the last few months.  This is affecting his quality of life as he has become afraid to leave the house and have uncontrollable bowel movements.  Diarrhea does not seem to be associated with eating and he denies having to rush to the bathroom after meals.  He has tried OTC Imodium and Pepto-Bismol but does not notice improvement with these.  Has had occasional abdominal bloating but states that this is minor and does not occur daily.  Bowel movements are often followed by forceful flatulence and he is having to clean the toilet after each bowel movement.  He denies any fever, chills, nausea, vomiting.  He otherwise feels well, feels he is in good health otherwise for his age.  He does have history of [name] which was treated in 2021 (see above) and states that there are days that his symptoms feel similar to when he had [name] infection.  He was briefly started on Macrobid  in May/June for  flank pain and concern of possible UTI.  Urine culture was clear and antibiotics were discontinued.  Patient states his weight is stable and he endorses a normal appetite.   Previous GI Procedures/Imaging   CT A/P 01/22/2024 - No acute findings. - Colonic diverticulosis, without radiographic evidence of diverticulitis. - Stable mildly enlarged prostate. - Stable mild hepatic steatosis.  Colonoscopy 12/18/2014 - 5 sessile polyps ranging from 2 to 6 mm in size were found in the  transverse colon, ascending colon, rectum, and descending colon; polypectomies were performed with cold forceps and with a cold snare - There was severe diverticulosis noted in the sigmoid colon - The examination was otherwise normal - The colon was redundant - No recall due to age.  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   Depression 2006   drug overdose   Diverticulosis    DVT (deep venous thrombosis) (HCC) 1991 & 2010   X 2 in context Factor 5 def   Esophageal reflux    Factor 5 Leiden mutation, heterozygous (HCC)    Gilbert's syndrome    H/O ischemic bowel disease 2010   while off warfarin    Herpes zoster 2010   facial   Hiatal hernia    Hx of adenomatous colonic polyps 12/14/2014   Hyperlipidemia    Hypertension    Insomnia    Osteoporosis    Personal history of colonic polyps 05/28/2007   adenomatous   Proteinuria age 58   Sciatica    Thrombosis of mesenteric vein (HCC) 07/12/2013   03/2009   Umbilical hernia     Past Surgical History:  Procedure Laterality Date   back injection  2025   lumbar steriod injection   CHOLECYSTECTOMY  05/16/2011    Dr Curvin   COLONOSCOPY     Dr Jakie   CYSTOSCOPY     Neg   EYE SURGERY Bilateral 10/16/2017   secondary catarct surgery.   KNEE ARTHROSCOPY Bilateral 2008, 2012   Dr.Aplington bilateral   TONSILLECTOMY     TRANSURETHRAL RESECTION OF PROSTATE      BPH;Dr Dahlstedt    Current Outpatient Medications  Medication Sig Dispense Refill   acetaminophen  (TYLENOL ) 500 MG  tablet Take 1,000 mg by mouth 2 (two) times daily as needed for moderate pain, fever or headache.     cholecalciferol (VITAMIN D3) 25 MCG (1000 UNIT) tablet Take 1,000 Units by mouth daily.     diazepam  (VALIUM ) 5 MG tablet TAKE 1/2 TO 1 TABLET BY MOUTH AT BEDTIME AS NEEDED FOR SLEEP 30 tablet 2   finasteride  (PROSCAR ) 5 MG tablet Take 5 mg by mouth daily.     folic acid (FOLVITE) 400 MCG tablet Take 400 mcg by mouth daily.     ipratropium (ATROVENT ) 0.03 % nasal spray Place 2 sprays into both nostrils daily as needed.     losartan -hydrochlorothiazide  (HYZAAR) 100-25 MG tablet Take 1 tablet by mouth daily. 90  tablet 3   Omega-3 Fatty Acids (FISH OIL PO) Take 1,400 mg by mouth daily.     omeprazole  (PRILOSEC) 40 MG capsule Take 1 capsule (40 mg total) by mouth daily as needed. 90 capsule 0   potassium chloride  SA (KLOR-CON  M) 20 MEQ tablet Take 1 tablet (20 mEq total) by mouth every other day. 45 tablet 0   predniSONE  (DELTASONE ) 50 MG tablet Take 50 mg by mouth daily with breakfast.     primidone  (MYSOLINE ) 50 MG tablet Take 2 tablets (100 mg total) by mouth at bedtime. For tremor 180 tablet 0   propranolol  ER (INDERAL  LA) 60 MG 24 hr capsule Take 1 capsule (60 mg total) by mouth daily. 30 capsule 12   simvastatin  (ZOCOR ) 10 MG tablet Take 1 tablet (10 mg total) by mouth daily. 90 tablet 3   warfarin (COUMADIN ) 5 MG tablet TAKE 1/2 TABLET BY MOUTH DAILY EXCEPT TAKE 1 TABLET ON TUESDAY, THURSDAY AND SATURDAYS OR AS DIRECTED BY ANTICOAGULATION CLINIC 90 tablet 1   No current facility-administered medications for this visit.    Allergies as of 04/04/2024 - Review Complete 04/04/2024  Allergen Reaction Noted   Amlodipine besy-benazepril hcl     Lipitor [atorvastatin calcium]  03/21/2011    Family History  Problem Relation Age of Onset   Pancreatic cancer Father 18   Ulcers Father    Stroke Father 40   Diabetes Father    Coronary artery disease Mother    Osteoporosis Mother    HIV Brother     Multiple sclerosis Sister    Heart attack Paternal Grandfather        early 39s   Heart attack Paternal Uncle        2 uncles in early 58s   Aortic aneurysm Brother    Colon cancer Neg Hx    Esophageal cancer Neg Hx    Stomach cancer Neg Hx     Social History   Tobacco Use   Smoking status: Former    Current packs/day: 0.00    Average packs/day: 2.0 packs/day for 40.0 years (80.0 ttl pk-yrs)    Types: Cigarettes    Start date: 08/18/1954    Quit date: 08/18/1994    Years since quitting: 29.6   Smokeless tobacco: Never   Tobacco comments:    smoked 1956-1996, up to 2 ppd  Vaping Use   Vaping status: Never Used  Substance Use Topics   Alcohol use: Yes    Alcohol/week: 3.0 standard drinks of alcohol    Types: 3 Cans of beer per week   Drug use: No     Review of Systems:    Constitutional: No weight loss, fever, chills, weakness  Cardiovascular: No chest pain Respiratory: No SOB Gastrointestinal: See HPI and otherwise negative Hematologic: No bleeding or bruising   Physical Exam:  Vital signs: BP 132/70   Pulse 65   Ht 5' 10 (1.778 m)   Wt 198 lb (89.8 kg)   BMI 28.41 kg/m   Wt Readings from Last 3 Encounters:  04/04/24 198 lb (89.8 kg)  01/15/24 200 lb 6.4 oz (90.9 kg)  09/24/23 201 lb (91.2 kg)   Constitutional: Pleasant male in NAD, alert and cooperative Head:  Normocephalic and atraumatic.  Eyes: No scleral icterus.  Respiratory: Respirations even and unlabored. Lungs clear to auscultation bilaterally.  No wheezes, crackles, or rhonchi.  Cardiovascular:  Regular rate and rhythm. No murmurs. No peripheral edema. Gastrointestinal:  Soft, nondistended, nontender. No rebound or guarding.  Normal bowel sounds. No appreciable masses or hepatomegaly. Rectal:  Not performed.  Neurologic:  Alert and oriented x4;  grossly normal neurologically.  Skin:   Dry and intact without significant lesions or rashes. Psychiatric: Oriented to person, place and time.  Demonstrates good judgement and reason without abnormal affect or behaviors.   RELEVANT LABS AND IMAGING: CBC    Component Value Date/Time   WBC 6.3 09/24/2023 1057   RBC 5.23 09/24/2023 1057   HGB 16.3 09/24/2023 1057   HGB 15.0 07/11/2013 1504   HCT 49.2 09/24/2023 1057   HCT 44.0 07/11/2013 1504   PLT 123.0 (L) 09/24/2023 1057   PLT 138 (L) 07/11/2013 1504   MCV 94.1 09/24/2023 1057   MCV 95.9 07/11/2013 1504   MCH 33.2 11/22/2019 0921   MCHC 33.2 09/24/2023 1057   RDW 13.7 09/24/2023 1057   RDW 13.4 07/11/2013 1504   LYMPHSABS 2.9 12/05/2019 1505   LYMPHSABS 2.6 07/11/2013 1504   MONOABS 1.0 12/05/2019 1505   MONOABS 0.6 07/11/2013 1504   EOSABS 0.1 12/05/2019 1505   EOSABS 0.1 07/11/2013 1504   BASOSABS 0.1 12/05/2019 1505   BASOSABS 0.1 07/11/2013 1504    CMP     Component Value Date/Time   NA 136 09/24/2023 1057   K 4.4 09/24/2023 1057   CL 97 09/24/2023 1057   CO2 29 09/24/2023 1057   GLUCOSE 108 (H) 09/24/2023 1057   BUN 14 09/24/2023 1057   CREATININE 0.80 09/24/2023 1057   CREATININE 0.96 04/03/2023 1442   CALCIUM 9.5 09/24/2023 1057   PROT 8.0 09/24/2023 1057   ALBUMIN 4.5 09/24/2023 1057   AST 26 09/24/2023 1057   ALT 28 09/24/2023 1057   ALKPHOS 74 09/24/2023 1057   BILITOT 1.6 (H) 09/24/2023 1057   GFRNONAA >60 11/22/2019 0921   GFRAA >60 11/22/2019 9078     Assessment/Plan:   Change in bowel habits Diarrhea Abdominal bloating Increased flatulence Patient seen today for 6 months of intermittent diarrhea which has increased in frequency over the last few months.  Can have multiple loose/watery stools daily with associated fecal urgency. Rare fecal incontinence. Can have forceful gas after bowel movements.  Occasional abdominal bloating which he states is minor and does not occur daily.  Denies any blood in his stool or melena.  No abdominal pain, nausea, vomiting, fever, chills.  Otherwise feels well, but diarrhea is impacting his quality of  life and he has become fearful of leaving the house. He does have history of [name] infection which was treated in 2021 and states that there are days that his symptoms feel similar to when he had [name]. CT A/P done in June showed diverticulosis without evidence of diverticulitis, stable mildly enlarged prostate, stable mild hepatic steatosis. Last colonoscopy performed 12/2014 with 5 small polyps removed and no recall recommended due to age.  - Check labs today: CBC, CMP, TSH - Stool studies: GI pathogen panel, [name] toxin A+B - If negative for infection consider trial of cholestyramine  due to history of cholecystectomy and lack of improvement on Imodium - Further workup to consider would include testing for SIBO versus empiric treatment, checking fecal pancreatic elastase, or endoscopic evaluation with biopsies for microscopic colitis - Follow up 4 weeks - Advised to let us  know if symptoms worsen   Camie Furbish, PA-C [facility] 04/04/2024, 2:59 PM    [facility] Attending   I agree with the Advanced Practitioner's note, impression and recommendations with the following additions:  Consider  antibiotics that would treat SIBO - which seems possible. Metronidazole  would be a cost-effective option.  Lupita CHARLENA Commander, MD, [facility]   Patient Care Team: Copland, Harlene BROCKS, MD as PCP - General (Family Medicine) Heide Ingle, MD as Consulting Physician (Orthopedic Surgery) Commander Lupita BRAVO, MD as Consulting Physician (Gastroenterology) Mannam, Praveen, MD as Consulting Physician (Pulmonary Disease)

## 2024-04-04 ENCOUNTER — Ambulatory Visit: Payer: Self-pay | Admitting: Gastroenterology

## 2024-04-04 ENCOUNTER — Ambulatory Visit: Admitting: Gastroenterology

## 2024-04-04 ENCOUNTER — Encounter: Payer: Self-pay | Admitting: Gastroenterology

## 2024-04-04 ENCOUNTER — Other Ambulatory Visit (INDEPENDENT_AMBULATORY_CARE_PROVIDER_SITE_OTHER)

## 2024-04-04 VITALS — BP 132/70 | HR 65 | Ht 70.0 in | Wt 198.0 lb

## 2024-04-04 DIAGNOSIS — R197 Diarrhea, unspecified: Secondary | ICD-10-CM | POA: Diagnosis not present

## 2024-04-04 DIAGNOSIS — R140 Abdominal distension (gaseous): Secondary | ICD-10-CM

## 2024-04-04 DIAGNOSIS — Z8619 Personal history of other infectious and parasitic diseases: Secondary | ICD-10-CM | POA: Diagnosis not present

## 2024-04-04 DIAGNOSIS — R143 Flatulence: Secondary | ICD-10-CM

## 2024-04-04 LAB — TSH: TSH: 1.92 u[IU]/mL (ref 0.35–5.50)

## 2024-04-04 LAB — CBC WITH DIFFERENTIAL/PLATELET
Basophils Absolute: 0 K/uL (ref 0.0–0.1)
Basophils Relative: 0.5 % (ref 0.0–3.0)
Eosinophils Absolute: 0.1 K/uL (ref 0.0–0.7)
Eosinophils Relative: 0.9 % (ref 0.0–5.0)
HCT: 40.1 % (ref 39.0–52.0)
Hemoglobin: 13.9 g/dL (ref 13.0–17.0)
Lymphocytes Relative: 35.6 % (ref 12.0–46.0)
Lymphs Abs: 2.4 K/uL (ref 0.7–4.0)
MCHC: 34.6 g/dL (ref 30.0–36.0)
MCV: 92.9 fl (ref 78.0–100.0)
Monocytes Absolute: 0.6 K/uL (ref 0.1–1.0)
Monocytes Relative: 8.5 % (ref 3.0–12.0)
Neutro Abs: 3.6 K/uL (ref 1.4–7.7)
Neutrophils Relative %: 54.5 % (ref 43.0–77.0)
Platelets: 156 K/uL (ref 150.0–400.0)
RBC: 4.32 Mil/uL (ref 4.22–5.81)
RDW: 13.9 % (ref 11.5–15.5)
WBC: 6.7 K/uL (ref 4.0–10.5)

## 2024-04-04 LAB — COMPREHENSIVE METABOLIC PANEL WITH GFR
ALT: 15 U/L (ref 0–53)
AST: 16 U/L (ref 0–37)
Albumin: 4.3 g/dL (ref 3.5–5.2)
Alkaline Phosphatase: 66 U/L (ref 39–117)
BUN: 14 mg/dL (ref 6–23)
CO2: 28 meq/L (ref 19–32)
Calcium: 8.7 mg/dL (ref 8.4–10.5)
Chloride: 100 meq/L (ref 96–112)
Creatinine, Ser: 0.9 mg/dL (ref 0.40–1.50)
GFR: 76.81 mL/min (ref >60.00)
Glucose, Bld: 90 mg/dL (ref 70–99)
Potassium: 3.9 meq/L (ref 3.5–5.1)
Sodium: 138 meq/L (ref 135–145)
Total Bilirubin: 1.4 mg/dL — ABNORMAL HIGH (ref 0.2–1.2)
Total Protein: 7.2 g/dL (ref 6.0–8.3)

## 2024-04-04 NOTE — Patient Instructions (Addendum)
 Your provider has requested that you go to the basement level for lab work before leaving today. Press B on the elevator. The lab is located at the first door on the left as you exit the elevator.   _______________________________________________________  If your blood pressure at your visit was 140/90 or greater, please contact your primary care physician to follow up on this.  _______________________________________________________  If you are age 87 or older, your body mass index should be between 23-30. Your Body mass index is 28.41 kg/m. If this is out of the aforementioned range listed, please consider follow up with your Primary Care Provider.  If you are age 87 or younger, your body mass index should be between 19-25. Your Body mass index is 28.41 kg/m. If this is out of the aformentioned range listed, please consider follow up with your Primary Care Provider.   ________________________________________________________  The [facility] providers would like to encourage you to use MYCHART to communicate with providers for non-urgent requests or questions.  Due to long hold times on the telephone, sending your provider a message by [facility] may be a faster and more efficient way to get a response.  Please allow 48 business hours for a response.  Please remember that this is for non-urgent requests.  _______________________________________________________  [facility] is using a team-based approach to care.  Your team is made up of your doctor and two to three APPS. Our APPS (Nurse Practitioners and Physician Assistants) work with your physician to ensure care continuity for you. They are fully qualified to address your health concerns and develop a treatment plan. They communicate directly with your gastroenterologist to care for you. Seeing the Advanced Practice Practitioners on your physician's team can help you by facilitating care more promptly, often allowing for earlier  appointments, access to diagnostic testing, procedures, and other specialty referrals.

## 2024-04-05 ENCOUNTER — Other Ambulatory Visit: Payer: Self-pay | Admitting: Family Medicine

## 2024-04-05 ENCOUNTER — Encounter: Payer: Self-pay | Admitting: Family Medicine

## 2024-04-05 ENCOUNTER — Other Ambulatory Visit

## 2024-04-05 ENCOUNTER — Ambulatory Visit

## 2024-04-05 DIAGNOSIS — R197 Diarrhea, unspecified: Secondary | ICD-10-CM

## 2024-04-05 DIAGNOSIS — R140 Abdominal distension (gaseous): Secondary | ICD-10-CM | POA: Diagnosis not present

## 2024-04-05 DIAGNOSIS — Z7901 Long term (current) use of anticoagulants: Secondary | ICD-10-CM

## 2024-04-05 DIAGNOSIS — R143 Flatulence: Secondary | ICD-10-CM

## 2024-04-05 LAB — POCT INR: INR: 2.3 (ref 2.0–3.0)

## 2024-04-05 NOTE — Telephone Encounter (Signed)
 Rx for propranolol  ER 80mg  denied. Per med list Pt should be on propranolol  ER 60mg  (this is prescribed by Dr. Margaret)

## 2024-04-05 NOTE — Progress Notes (Signed)
 Continue 1/2 tablet daily except take 1 tablet on Saturday, Tuesday and Thursday. Recheck in 6 weeks.

## 2024-04-05 NOTE — Patient Instructions (Addendum)
 Pre visit review using our clinic review tool, if applicable. No additional management support is needed unless otherwise documented below in the visit note.  Continue 1/2 tablet daily except take 1 tablet on Saturday, Tuesday and Thursday. Recheck in 6 weeks.

## 2024-04-06 LAB — C. DIFFICILE GDH AND TOXIN A/B
GDH ANTIGEN: NOT DETECTED
MICRO NUMBER:: 16852208
SPECIMEN QUALITY:: ADEQUATE
TOXIN A AND B: NOT DETECTED

## 2024-04-06 MED ORDER — PROPRANOLOL HCL ER 120 MG PO CP24: 120.0000 mg | ORAL_CAPSULE | Freq: Every day | ORAL | 3 refills | Status: AC

## 2024-04-06 NOTE — Addendum Note (Signed)
 Addended by: WATT RAISIN C on: 04/06/2024 12:10 PM   Modules accepted: Orders

## 2024-04-07 LAB — GASTROINTESTINAL PATHOGEN PNL

## 2024-04-11 MED ORDER — METRONIDAZOLE 250 MG PO TABS: 250.0000 mg | ORAL_TABLET | Freq: Three times a day (TID) | ORAL | 0 refills | Status: AC

## 2024-04-12 ENCOUNTER — Encounter: Payer: Self-pay | Admitting: Family Medicine

## 2024-04-12 MED ORDER — FINASTERIDE 5 MG PO TABS: 5.0000 mg | ORAL_TABLET | Freq: Every day | ORAL | 3 refills | Status: AC

## 2024-04-29 ENCOUNTER — Encounter: Payer: Self-pay | Admitting: Family Medicine

## 2024-04-29 DIAGNOSIS — M791 Myalgia, unspecified site: Secondary | ICD-10-CM | POA: Diagnosis not present

## 2024-04-29 DIAGNOSIS — M5459 Other low back pain: Secondary | ICD-10-CM | POA: Diagnosis not present

## 2024-04-30 ENCOUNTER — Encounter: Payer: Self-pay | Admitting: Family Medicine

## 2024-05-03 ENCOUNTER — Other Ambulatory Visit: Payer: Self-pay | Admitting: Family Medicine

## 2024-05-03 DIAGNOSIS — E876 Hypokalemia: Secondary | ICD-10-CM

## 2024-05-03 DIAGNOSIS — G250 Essential tremor: Secondary | ICD-10-CM

## 2024-05-05 ENCOUNTER — Encounter: Payer: Self-pay | Admitting: Gastroenterology

## 2024-05-05 ENCOUNTER — Ambulatory Visit: Admitting: Gastroenterology

## 2024-05-05 VITALS — BP 124/56 | HR 64 | Ht 70.0 in | Wt 195.0 lb

## 2024-05-05 DIAGNOSIS — R197 Diarrhea, unspecified: Secondary | ICD-10-CM

## 2024-05-05 DIAGNOSIS — Z7901 Long term (current) use of anticoagulants: Secondary | ICD-10-CM

## 2024-05-05 DIAGNOSIS — R194 Change in bowel habit: Secondary | ICD-10-CM | POA: Diagnosis not present

## 2024-05-05 MED ORDER — NA SULFATE-K SULFATE-MG SULF 17.5-3.13-1.6 GM/177ML PO SOLN: 1.0000 | Freq: Once | ORAL | 0 refills | Status: AC

## 2024-05-05 NOTE — Progress Notes (Signed)
 05/05/2024 Patrick Hatfield [id number] [birth year]   HISTORY OF PRESENT ILLNESS:  This is an 89 y.o. male with past medical history of anxiety/depression, arthritis, asthma, Barrett's esophagus, adenomatous colon polyps, diverticulosis, history of DVT, GERD, factor V Leiden mutation on coumadin , Gilbert's syndrome, history of ischemic bowel disease, hiatal hernia, HLD, HTN, osteoporosis, mesenteric vein thrombosis 2014, cholecystectomy 2012, TURP who presents here for follow-up of his diarrhea.  Please see note from Camie Furbish, PA-C on 04/04/2024 for detailed overview.  In summary patient tells me that he has had some diarrhea issues on and off since for many many years, but over the past 7-9 months things have significantly worsened.  He did have [name] back in 2021, but recent [name] stool study was negative.  He was treated with Flagyl  empirically for SIBO, but did not notice a difference with that medication.  He is now here for follow-up.  He tells me that the diarrhea has gotten to the point that it controls his life.  He is a very active person and has had to begin planning his life around the diarrhea and has to know where all the bathrooms are.  He has had 3 episodes of incontinence in public.   CT scan abdomen and pelvis with contrast 01/2024:  IMPRESSION: No acute findings.   Colonic diverticulosis, without radiographic evidence of diverticulitis.   Stable mildly enlarged prostate.   Stable mild hepatic steatosis.   Colonoscopy 12/2014:  1. Five sessile polyps ranging from 2 to 6mm in size were found in the transverse colon, ascending colon, rectum, and descending colon; polypectomies were performed with cold forceps and with a cold snare 2. There was severe diverticulosis noted in the sigmoid colon 3. The examination was otherwise normal 4. The colon was redundant.  1. Surgical [P], ascending, polyp (2) - TUBULAR ADENOMAS. - NO HIGH GRADE DYSPLASIA OR MALIGNANCY  IDENTIFIED. 2. Surgical [P], descending and rectal, polyp (3) - TUBULAR ADENOMAS. - NO HIGH GRADE DYSPLASIA OR MALIGNANCY IDENTIFIED.   Past Medical History:  Diagnosis Date   Anxiety    Arthritis    KNEES,ANKLES,HANDS   Asthma    AS A TEENAGER   Barrett's esophagus    Benign essential tremor    Clotting disorder (HCC)    FACTOR 5   Colitis in 20's   Colon polyps    Congenital deficiency of other clotting factors    factor 5   Depression 2006   drug overdose   Diverticulosis    DVT (deep venous thrombosis) (HCC) 1991 & 2010   X 2 in context Factor 5 def   Esophageal reflux    Factor 5 Leiden mutation, heterozygous (HCC)    Gilbert's syndrome    H/O ischemic bowel disease 2010   while off warfarin    Herpes zoster 2010   facial   Hiatal hernia    Hx of adenomatous colonic polyps 12/14/2014   Hyperlipidemia    Hypertension    Insomnia    Osteoporosis    Personal history of colonic polyps 05/28/2007   adenomatous   Proteinuria age 42   Sciatica    Thrombosis of mesenteric vein (HCC) 07/12/2013   03/2009   Umbilical hernia    Past Surgical History:  Procedure Laterality Date   back injection  2025   lumbar steriod injection   CHOLECYSTECTOMY  05/16/2011    Dr Curvin   COLONOSCOPY     Dr Jakie   CYSTOSCOPY  Neg   EYE SURGERY Bilateral 10/16/2017   secondary catarct surgery.   KNEE ARTHROSCOPY Bilateral 2008, 2012   Dr.Aplington bilateral   TONSILLECTOMY     TRANSURETHRAL RESECTION OF PROSTATE      BPH;Dr Dahlstedt    reports that he quit smoking about 29 years ago. His smoking use included cigarettes. He started smoking about 69 years ago. He has a 80 pack-year smoking history. He has never used smokeless tobacco. He reports current alcohol use of about 1.0 standard drink of alcohol per week. He reports that he does not use drugs. family history includes Aortic aneurysm in his brother; Coronary artery disease in his mother; Diabetes in his father; HIV  in his brother; Heart attack in his paternal grandfather and paternal uncle; Multiple sclerosis in his sister; Osteoporosis in his mother; Pancreatic cancer (age of onset: 36) in his father; Stroke (age of onset: 75) in his father; Ulcers in his father. Allergies  Allergen Reactions   Amlodipine Besy-Benazepril Hcl     REACTION: swelling of feet.  Probably  from Amlodipine component)   Lipitor [Atorvastatin Calcium]     ? Reaction, ? Swelling=- update 7/20.  Pt recalls his legs hurt and perhaps swelled slightly - JC      Outpatient Encounter Medications as of 05/05/2024  Medication Sig   acetaminophen  (TYLENOL ) 500 MG tablet Take 1,000 mg by mouth 2 (two) times daily as needed for moderate pain, fever or headache.   cholecalciferol (VITAMIN D3) 25 MCG (1000 UNIT) tablet Take 1,000 Units by mouth daily.   diazepam  (VALIUM ) 5 MG tablet TAKE 1/2 TO 1 TABLET BY MOUTH AT BEDTIME AS NEEDED FOR SLEEP   finasteride  (PROSCAR ) 5 MG tablet Take 1 tablet (5 mg total) by mouth daily.   folic acid (FOLVITE) 400 MCG tablet Take 400 mcg by mouth daily.   ipratropium (ATROVENT ) 0.03 % nasal spray Place 2 sprays into both nostrils daily as needed.   losartan -hydrochlorothiazide  (HYZAAR) 100-25 MG tablet Take 1 tablet by mouth daily.   Omega-3 Fatty Acids (FISH OIL PO) Take 1,400 mg by mouth daily.   omeprazole  (PRILOSEC) 40 MG capsule Take 1 capsule (40 mg total) by mouth daily as needed.   potassium chloride  SA (KLOR-CON  M) 20 MEQ tablet Take 1 tablet (20 mEq total) by mouth every other day.   predniSONE  (DELTASONE ) 10 MG tablet Take 20 mg by mouth in the morning, at noon, and at bedtime.   primidone  (MYSOLINE ) 50 MG tablet Take 2 tablets (100 mg total) by mouth at bedtime. For tremor   propranolol  ER (INDERAL  LA) 120 MG 24 hr capsule Take 1 capsule (120 mg total) by mouth daily.   simvastatin  (ZOCOR ) 10 MG tablet Take 1 tablet (10 mg total) by mouth daily.   warfarin (COUMADIN ) 5 MG tablet TAKE 1/2 TABLET  BY MOUTH DAILY EXCEPT TAKE 1 TABLET ON TUESDAY, THURSDAY AND SATURDAYS OR AS DIRECTED BY ANTICOAGULATION CLINIC   [DISCONTINUED] predniSONE  (DELTASONE ) 50 MG tablet Take 50 mg by mouth daily with breakfast.   No facility-administered encounter medications on file as of 05/05/2024.    REVIEW OF SYSTEMS  : All other systems reviewed and negative except where noted in the History of Present Illness.   PHYSICAL EXAM: BP (!) 124/56   Pulse 64   Ht 5' 10 (1.778 m)   Wt 195 lb (88.5 kg)   BMI 27.98 kg/m  General: Well developed white male in no acute distress Head: Normocephalic and atraumatic Eyes:  Sclerae  anicteric, conjunctiva pink. Ears: Normal auditory acuity Lungs: Clear throughout to auscultation; no W/R/R. Heart: Regular rate and rhythm; no M/R/G. Abdomen: Soft, non-distended.  BS present and hyperactive.  Non-tender. Rectal:  Will be done at the time of colonoscopy. Musculoskeletal: Symmetrical with no gross deformities  Skin: No lesions on visible extremities Extremities: No edema. Neurological: Alert oriented x 4, grossly non-focal Psychological:  Alert and cooperative. Normal mood and affect  ASSESSMENT AND PLAN: *Change in bowel habits with diarrhea: Reports some longstanding issues with diarrhea intermittently, but much worse over the past 7 to 9 months.  Empiric treatment for SIBO with Flagyl  250 mg 3 times daily for 10 days did not give any improvement.  [name] stool study negative.  He is s/p cholecystectomy. *Chronic anticoagulation with Coumadin  due to factor V Leiden:  Will hold Coumadin  for 5 days prior to endoscopic procedures - will instruct when and how to resume after procedure. Benefits and risks of procedure explained including risks of bleeding, perforation, infection, missed lesions, reactions to medications and possible need for hospitalization and surgery for complications. Additional rare but real risk of stroke or other vascular clotting events off of  coumadin  also explained and need to seek urgent help if any signs of these problems occur. Will communicate by phone or EMR with patient's prescribing provider, Dr. Watt, to confirm that holding coumadin  is reasonable in this case.    -At this point his life is being very impacted by his diarrhea with urgency and incontinence, etc.  Will plan for colonoscopy Dr. Avram to rule out microscopic colitis, etc. - If that is negative consider trial of Questran  due to history of cholecystectomy. - Could also consider checking pancreatic fecal elastase to rule out EPI.   CC:  Copland, Harlene BROCKS, MD

## 2024-05-05 NOTE — Patient Instructions (Signed)
 You have been scheduled for a colonoscopy. Please follow written instructions given to you at your visit today.   If you use inhalers (even only as needed), please bring them with you on the day of your procedure.  DO NOT TAKE 7 DAYS PRIOR TO TEST- Trulicity (dulaglutide) Ozempic, Wegovy (semaglutide) Mounjaro (tirzepatide) Bydureon Bcise (exanatide extended release)  DO NOT TAKE 1 DAY PRIOR TO YOUR TEST Rybelsus (semaglutide) Adlyxin (lixisenatide) Victoza (liraglutide) Byetta (exanatide) ___________________________________________________________________________

## 2024-05-06 ENCOUNTER — Telehealth: Payer: Self-pay | Admitting: *Deleted

## 2024-05-06 NOTE — Telephone Encounter (Signed)
  Patrick Hatfield Jan 29, 1935 [id number]  05/06/24   Dear Dr. Watt:  We have scheduled the above named patient for a(n) colonoscopy procedure. Our records show that (s)he is on anticoagulation therapy.  Please advise as to whether the patient may come off their therapy of Coumadin  5 days prior to their procedure which is scheduled for 06/08/24.  Please route your response to Powell Misty, CMA or fax response to [phone].  Sincerely,   Powell Misty, [facility]

## 2024-05-09 NOTE — Telephone Encounter (Signed)
 Lovenox  bridge recommended for upcoming colonoscopy on 10/22.  Indication; Factor V, thrombosis of mesenteric vein, DVT 1991 and 2010  Current warfarin dose; Take 1/2 tablet (2.5 mg) daily except take 1 tablet (5 mg) on Tuesday, Thursday and Saturday  Actual wt: 88.5 kg Ideal wt: 73 kg (actual wt >122% of ideal wt; ok to use actual wt for CrCl calculation)  CrCl: 72.38 mL/min (can use once daily dosing if > 30.0 mL/min  Lovenox  dose; 150 mg daily in AM  10/17: Take last dose of warfarin 10/18: NO warfarin, NO Lovenox  10/19: NO warfarin, inject Lovenox  once in the AM 10/20: NO warfarin, inject Lovenox  once in the AM 10/21: NO warfarin, inject Lovenox  once in the AM BEFORE 7 AM  10/22: SURGERY; NO WARFARIN, NO LOVENOX   10/23: Take 1 1/2 tablets (7.5 mg) warfarin, inject Lovenox  once in the AM 10/24: Take 1 tablet (5 mg) warfarin, inject Lovenox  once in the AM 10/25: Take 1 1/2 tablets (7.5 mg) warfarin, inject Lovenox  once in the AM 10/26: Take 1/2 tablet (2.5 mg) warfarin, inject Lovenox  once in the AM 10/27: Take 1/2 tablet (25 mg) warfarin, inject Lovenox  once in the AM 10/28: Recheck INR; NO WARFARIN AND NO LOVENOX  UNTIL AFTER INR CHECK

## 2024-05-12 NOTE — Progress Notes (Addendum)
 [name] [address] [place], [place] [address] [phone]  Date:  05/16/2024   Name:  Patrick Hatfield   DOB:  Dec 31, 1934   MRN:  [id number]  PCP:  Patrick Harlene BROCKS, MD    Chief Complaint: Follow-up (No concerns I'm feeling really well )   History of Present Illness:  Patrick Hatfield is a 89 y.o. very pleasant male patient who presents with the following:  Patient seen today for periodic follow-up I saw him most recently in February-  history of factor V Leiden, DVT, mesenteric vein thrombosis currently on Coumadin , essential tremor, hypertension, hyperlipidemia.   Flu shot- he will have done with coumadin  clinic tomorrow Recommend COVID booster this fall He has already completed RSV and shingles vaccination Labs on chart from August-CMP, CBC, TSH He follows up with Patrick Hatfield, the Coumadin  nurse for INR management.  They will do a lovenox  bridge coming up for a GI procedure   Discussed the use of AI scribe software for clinical note transcription with the patient, who gave verbal consent to proceed.  History of Present Illness Patrick Hatfield is an 89 year old male who presents for evaluation of bowel dysfunction and preparation for a colonoscopy.  He has a long-standing history of bowel issues, describing his condition as his bowels being 'locked in the open position.' A colonoscopy is scheduled for further evaluation. No vomiting or pain associated with his bowel issues, but he has experienced some weight loss and reports a decreased appetite, although he continues to eat healthily.  He has a history of sciatica, which was initially suspected to be a kidney issue. The sciatica symptoms have resolved without the use of prednisone , which he chose not to take due to feeling 'strange' on it.  He is currently taking diazepam , half a tablet two to three times a week, which he finds effective. He also takes  potassium every other day, but has encountered issues with refilling his prescription.  He has a history of back issues that have prevented him from golfing for the past three years. 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  He is the primary caregiver for his wife, Patrick Hatfield, who suffered a stroke six years ago. She has made significant progress but still experiences balance issues and short-term memory problems. He prepares all her meals and assists with her care.  He has three daughters living nearby and a supportive family network. He plans to travel to [facility] with friends, and his daughter from [place]  will stay with Patrick Hatfield during his absence.   Patient Active Problem List   Diagnosis Date Noted   Acute cough 08/05/2022   [name] diarrhea 12/28/2019   Hearing loss 01/26/2018   Long term (current) use of anticoagulants 05/15/2017   Encounter for therapeutic drug monitoring 12/05/2016   Insomnia 09/05/2015   Diarrhea 09/05/2015   Hx of adenomatous colonic polyps 12/14/2014   Hematuria, gross 02/03/2014   Thrombosis of mesenteric vein 07/12/2013   Thrombocytopenia, unspecified 01/05/2013   Bertrum syndrome 01/05/2013   Unspecified adverse effect of unspecified drug, medicinal and biological substance 01/09/2012   Barrett's esophagus 03/24/2011   Factor 5 Leiden mutation, heterozygous 03/21/2011   Essential tremor 04/24/2009   Hyperlipidemia 10/03/2008   Essential hypertension 10/03/2008   GERD 10/03/2008   Fasting hyperglycemia 10/03/2008   DVT, HX OF  10/03/2008    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   Depression 2006   drug overdose    Diverticulosis    DVT (deep venous thrombosis) (HCC) 1991 & 2010   X 2 in context Factor 5 def   Esophageal reflux    Factor 5 Leiden mutation, heterozygous    Gilbert's syndrome    H/O ischemic bowel disease 2010   while off warfarin    Herpes zoster 2010   facial   Hiatal hernia    Hx of adenomatous colonic polyps 12/14/2014   Hyperlipidemia    Hypertension    Insomnia    Osteoporosis    Personal history of colonic polyps 05/28/2007   adenomatous   Proteinuria age 26   Sciatica    Thrombosis of mesenteric vein 07/12/2013   03/2009   Umbilical hernia     Past Surgical History:  Procedure Laterality Date   back injection  2025   lumbar steriod injection   CHOLECYSTECTOMY  05/16/2011    Dr Curvin   COLONOSCOPY     Dr Jakie   CYSTOSCOPY     Neg   EYE SURGERY Bilateral 10/16/2017   secondary catarct surgery.   KNEE ARTHROSCOPY Bilateral 2008, 2012   Dr.Aplington bilateral   TONSILLECTOMY     TRANSURETHRAL RESECTION OF PROSTATE      BPH;Dr Dahlstedt    Social History   Tobacco Use   Smoking status: Former    Current packs/day: 0.00    Average packs/day: 2.0 packs/day for 40.0 years (80.0 ttl pk-yrs)    Types: Cigarettes    Start date: 08/18/1954    Quit date: 08/18/1994    Years since quitting: 29.7   Smokeless tobacco: Never   Tobacco comments:    smoked 1956-1996, up to 2 ppd  Vaping Use   Vaping status: Never Used  Substance Use Topics   Alcohol use: Yes    Alcohol/week: 1.0 standard drink of alcohol    Types: 1 Cans of beer per week    Comment: 1 beer per week   Drug use: No    Family History  Problem Relation Age of Onset   Pancreatic cancer Father 56   Ulcers Father    Stroke Father 110   Diabetes Father    Coronary artery disease Mother    Osteoporosis Mother    HIV Brother    Multiple sclerosis Sister    Heart attack Paternal Grandfather        early 93s   Heart attack Paternal Uncle        2 uncles in early 40s   Aortic aneurysm  Brother    Colon cancer Neg Hx    Esophageal cancer Neg Hx    Stomach cancer Neg Hx     Allergies  Allergen Reactions   Amlodipine Besy-Benazepril Hcl     REACTION: swelling of feet.  Probably  from Amlodipine component)   Lipitor [Atorvastatin Calcium]     ? Reaction, ? Swelling=- update 7/20.  Pt recalls his legs hurt and perhaps swelled slightly - JC    Medication list has been reviewed and updated.  Current Outpatient Medications on File Prior to Visit  Medication Sig Dispense Refill   acetaminophen  (TYLENOL ) 500 MG tablet Take 1,000 mg by mouth 2 (two) times daily as needed for moderate pain, fever or headache.     cholecalciferol (VITAMIN D3) 25 MCG (1000 UNIT) tablet Take 1,000 Units by mouth daily.     diazepam  (VALIUM ) 5 MG tablet TAKE 1/2 TO 1 TABLET BY MOUTH AT BEDTIME AS NEEDED FOR SLEEP 30 tablet 2   finasteride  (PROSCAR ) 5 MG tablet Take 1 tablet (5 mg total) by mouth daily. 90 tablet 3   folic acid (FOLVITE) 400 MCG tablet Take 400 mcg by mouth daily.     ipratropium (ATROVENT ) 0.03 % nasal spray Place 2 sprays into both nostrils daily as needed.     losartan -hydrochlorothiazide  (HYZAAR) 100-25 MG tablet Take 1 tablet by mouth daily. 90 tablet 3   Omega-3 Fatty Acids (FISH OIL PO) Take 1,400 mg by mouth daily.     omeprazole  (PRILOSEC) 40 MG capsule Take 1 capsule (40 mg total) by mouth daily as needed. 90 capsule 0   primidone  (MYSOLINE ) 50 MG tablet Take 2 tablets (100 mg total) by mouth at bedtime. For tremor 60 tablet 0   propranolol  ER (INDERAL  LA) 120 MG 24 hr capsule Take 1 capsule (120 mg total) by mouth daily. 90 capsule 3   simvastatin  (ZOCOR ) 10 MG tablet Take 1 tablet (10 mg total) by mouth daily. 90 tablet 3   warfarin (COUMADIN ) 5 MG tablet TAKE 1/2 TABLET BY MOUTH DAILY EXCEPT TAKE 1 TABLET ON TUESDAY, THURSDAY AND SATURDAYS OR AS DIRECTED BY ANTICOAGULATION CLINIC 90 tablet 1   predniSONE  (DELTASONE ) 10 MG tablet Take 20 mg by mouth in the morning, at  noon, and at bedtime. (Patient not taking: Reported on 05/16/2024)     No current facility-administered medications on file prior to visit.    Review of Systems:  As per HPI- otherwise negative.  Wt Readings from Last 3 Encounters:  05/16/24 191 lb 3.2 oz (86.7 kg)  05/05/24 195 lb (88.5 kg)  04/04/24 198 lb (89.8 kg)    Physical Examination: Vitals:   05/16/24 0951  BP: 130/74  Pulse: 61  SpO2: 98%   Vitals:   05/16/24 0951  Weight: 191 lb 3.2 oz (86.7 kg)  Height: 5' 10 (1.778 m)   Body mass index is 27.43 kg/m. Ideal Body Weight: Weight in (lb) to have BMI = 25: 173.9  GEN: no acute distress.  Mild overweight, looks well HEENT: Atraumatic, Normocephalic.  Ears and Nose: No external deformity. CV: RRR, No M/G/R. No JVD. No thrill. No extra heart sounds. PULM: CTA B, no wheezes, crackles, rhonchi. No retractions. No resp. distress. No accessory muscle use. ABD: S, NT, ND, +BS EXTR: No c/c/e PSYCH: Normally interactive. Conversant.    Assessment and Plan: Essential hypertension  Essential tremor  Bilateral hearing loss, unspecified hearing loss type  Factor 5 Leiden mutation, heterozygous  Thrombocytopenia, unspecified  Screening, lipid - Plan: Lipid panel  Screening for diabetes mellitus - Plan: Hemoglobin A1c  Special screening, prostate cancer - Plan: PSA, Medicare ( [place] [facility] only)  Hypokalemia - Plan: potassium chloride  SA (KLOR-CON  M) 20 MEQ tablet  Assessment & Plan Bowel dysfunction (possible chronic diarrhea) Chronic diarrhea with weight loss and decreased appetite, no pain or vomiting. - Proceed with scheduled colonoscopy on the 22nd. - Perform Lovenox  bridge for Coumadin  prior to colonoscopy.  Anticoagulation management (warfarin, Lovenox  bridge) On long-term warfarin, requires Lovenox  bridge  for colonoscopy. Discussed bleeding risks and Lovenox  benefits. - Initiate Lovenox  bridge therapy as per Shannon's orders, one shot in the  morning for five to six days. - Ensure prescription for Lovenox  is sent to pharmacy.  Essential (primary) hypertension Blood pressure well-controlled on current regimen.  General Health Maintenance Routine health maintenance discussed, including immunizations and screenings. - Administer flu shot as planned with Patrick Hatfield. - Recommend COVID booster this fall at pharmacy. - Order PSA test.  Advised patient I typically would not do a PSA in someone of his age, but he does prefer to continue this test - Order lipid panel. - Order A1c test.  Signed Harlene Schroeder, MD  Received labs, message to patient. Results for orders placed or performed in visit on 05/16/24  Hemoglobin A1c   Collection Time: 05/16/24 10:24 AM  Result Value Ref Range   Hgb A1c MFr Bld 5.4 4.6 - 6.5 %  PSA, Medicare ( [place] [facility] only)   Collection Time: 05/16/24 10:24 AM  Result Value Ref Range   PSA 1.48 0.10 - 4.00 ng/ml

## 2024-05-12 NOTE — Patient Instructions (Addendum)
 It was great to see you again today Recommend a COVID booster this fall, this can be done at your pharmacy Flu shot planned tomorrow  I will be in touch with your labs asap Please see me in about 6 months

## 2024-05-16 ENCOUNTER — Encounter: Payer: Self-pay | Admitting: Family Medicine

## 2024-05-16 ENCOUNTER — Ambulatory Visit: Admitting: Family Medicine

## 2024-05-16 VITALS — BP 130/74 | HR 61 | Ht 70.0 in | Wt 191.2 lb

## 2024-05-16 DIAGNOSIS — Z131 Encounter for screening for diabetes mellitus: Secondary | ICD-10-CM | POA: Diagnosis not present

## 2024-05-16 DIAGNOSIS — G250 Essential tremor: Secondary | ICD-10-CM | POA: Diagnosis not present

## 2024-05-16 DIAGNOSIS — I10 Essential (primary) hypertension: Secondary | ICD-10-CM

## 2024-05-16 DIAGNOSIS — H9193 Unspecified hearing loss, bilateral: Secondary | ICD-10-CM | POA: Diagnosis not present

## 2024-05-16 DIAGNOSIS — D6851 Activated protein C resistance: Secondary | ICD-10-CM | POA: Diagnosis not present

## 2024-05-16 DIAGNOSIS — E876 Hypokalemia: Secondary | ICD-10-CM | POA: Diagnosis not present

## 2024-05-16 DIAGNOSIS — Z125 Encounter for screening for malignant neoplasm of prostate: Secondary | ICD-10-CM

## 2024-05-16 DIAGNOSIS — D696 Thrombocytopenia, unspecified: Secondary | ICD-10-CM

## 2024-05-16 DIAGNOSIS — Z13220 Encounter for screening for lipoid disorders: Secondary | ICD-10-CM

## 2024-05-16 LAB — LIPID PANEL
Cholesterol: 168 mg/dL (ref <200)
HDL: 45 mg/dL (ref >=40)
LDL Cholesterol (Calc): 100 mg/dL — ABNORMAL HIGH
Non-HDL Cholesterol (Calc): 123 mg/dL (ref <130)
Total CHOL/HDL Ratio: 3.7 (calc) (ref <5.0)
Triglycerides: 133 mg/dL (ref <150)

## 2024-05-16 LAB — HEMOGLOBIN A1C: Hgb A1c MFr Bld: 5.4 % (ref 4.6–6.5)

## 2024-05-16 LAB — PSA, MEDICARE: PSA: 1.48 ng/mL (ref 0.10–4.00)

## 2024-05-16 MED ORDER — POTASSIUM CHLORIDE CRYS ER 20 MEQ PO TBCR: 20.0000 meq | EXTENDED_RELEASE_TABLET | ORAL | 3 refills | Status: AC

## 2024-05-17 ENCOUNTER — Encounter: Payer: Self-pay | Admitting: Family Medicine

## 2024-05-17 ENCOUNTER — Ambulatory Visit (INDEPENDENT_AMBULATORY_CARE_PROVIDER_SITE_OTHER)

## 2024-05-17 DIAGNOSIS — Z23 Encounter for immunization: Secondary | ICD-10-CM

## 2024-05-17 DIAGNOSIS — Z7901 Long term (current) use of anticoagulants: Secondary | ICD-10-CM

## 2024-05-17 LAB — POCT INR: INR: 1.5 — AB (ref 2.0–3.0)

## 2024-05-17 MED ORDER — ENOXAPARIN SODIUM 150 MG/ML IJ SOSY: 150.0000 mg | PREFILLED_SYRINGE | INTRAMUSCULAR | 0 refills | Status: DC

## 2024-05-17 NOTE — Addendum Note (Signed)
 Addended by: ETHYL KIRSCH A on: 05/17/2024 11:02 AM   Modules accepted: Orders

## 2024-05-17 NOTE — Addendum Note (Signed)
 Addended by: ETHYL KIRSCH A on: 05/17/2024 05:10 PM   Modules accepted: Orders

## 2024-05-17 NOTE — Progress Notes (Signed)
 Sent in script for Lovenox .

## 2024-05-17 NOTE — Progress Notes (Addendum)
 Pt is scheduled for a colonoscopy for 10/22 and will be placed on a Lovenox  bridge. Pt reports he has been eating a lot of tomato soup lately. Tomatoes do contain vitamin K. This could be the cause of the low INR. Pt denies any other changes.  Increase dose today and tomorrow to take  1 1/2 tablets and then continue 1/2 tablet daily except take 1 tablet on Saturday, Tuesday and Thursday until starting the instructions below. 10/17: Take last dose of warfarin 10/18: NO warfarin, NO Lovenox  10/19: NO warfarin, inject Lovenox  once in the AM 10/20: NO warfarin, inject Lovenox  once in the AM 10/21: NO warfarin, inject Lovenox  once in the AM BEFORE 7 AM   10/22: SURGERY; NO WARFARIN, NO LOVENOX    10/23: Take 1 1/2 tablets (7.5 mg) warfarin, inject Lovenox  once in the AM 10/24: Take 1 tablet (5 mg) warfarin, inject Lovenox  once in the AM 10/25: Take 1 1/2 tablets (7.5 mg) warfarin, inject Lovenox  once in the AM 10/26: Take 1/2 tablet (2.5 mg) warfarin, inject Lovenox  once in the AM 10/27: Take 1/2 tablet (25 mg) warfarin, inject Lovenox  once in the AM 10/28: Recheck INR; NO WARFARIN AND NO LOVENOX  UNTIL AFTER INR CHECK  Pt requested high dose flu vaccine. Administered high dose flu vaccine. Pt tolerated well.

## 2024-05-17 NOTE — Patient Instructions (Addendum)
 Pre visit review using our clinic review tool, if applicable. No additional management support is needed unless otherwise documented below in the visit note.  Increase dose today and tomorrow to take  1 1/2 tablets and then continue 1/2 tablet daily except take 1 tablet on Saturday, Tuesday and Thursday until starting the instructions below. 10/17: Take last dose of warfarin 10/18: NO warfarin, NO Lovenox  10/19: NO warfarin, inject Lovenox  once in the AM 10/20: NO warfarin, inject Lovenox  once in the AM 10/21: NO warfarin, inject Lovenox  once in the AM BEFORE 7 AM   10/22: SURGERY; NO WARFARIN, NO LOVENOX    10/23: Take 1 1/2 tablets (7.5 mg) warfarin, inject Lovenox  once in the AM 10/24: Take 1 tablet (5 mg) warfarin, inject Lovenox  once in the AM 10/25: Take 1 1/2 tablets (7.5 mg) warfarin, inject Lovenox  once in the AM 10/26: Take 1/2 tablet (2.5 mg) warfarin, inject Lovenox  once in the AM 10/27: Take 1/2 tablet (25 mg) warfarin, inject Lovenox  once in the AM 10/28: Recheck INR; NO WARFARIN AND NO LOVENOX  UNTIL AFTER INR CHECK

## 2024-05-18 ENCOUNTER — Encounter: Payer: Self-pay | Admitting: Family Medicine

## 2024-05-20 ENCOUNTER — Ambulatory Visit: Admitting: Gastroenterology

## 2024-05-31 ENCOUNTER — Encounter: Payer: Self-pay | Admitting: Internal Medicine

## 2024-06-01 ENCOUNTER — Other Ambulatory Visit: Payer: Self-pay | Admitting: Family Medicine

## 2024-06-01 DIAGNOSIS — G250 Essential tremor: Secondary | ICD-10-CM

## 2024-06-07 NOTE — Progress Notes (Unsigned)
 [facility] History and Physical   Primary Care Physician:  Watt Harlene BROCKS, MD   Reason for Procedure:    Encounter Diagnosis  Name Primary?   Diarrhea, unspecified type Yes     Plan:    Colonoscopy (use pediatric colonoscope)     HPI: Patrick Hatfield is a 89 y.o. male presenting for evaluation of diarrhea.  The patient does have some history of [name], he has not responded to empiric trial with metronidazole  for possible SIBO and he is experiencing some urgency fecal incontinence at times.  His warfarin has been held.  Please also see August 18 and May 05, 2024 gastroenterology office visit notes.  Last colonoscopy in 2016 with severe diverticulosis and 5 diminutive adenomatous polyps.  Pediatric colonoscope was used.   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   Depression 2006   drug overdose   Diverticulosis    DVT (deep venous thrombosis) (HCC) 1991 & 2010   X 2 in context Factor 5 def   Esophageal reflux    Factor 5 Leiden mutation, heterozygous    Gilbert's syndrome    H/O ischemic bowel disease 2010   while off warfarin    Herpes zoster 2010   facial   Hiatal hernia    Hx of adenomatous colonic polyps 12/14/2014   Hyperlipidemia    Hypertension    Insomnia    Osteoporosis    Personal history of colonic polyps 05/28/2007   adenomatous   Proteinuria age 83   Sciatica    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   KNEE ARTHROSCOPY Bilateral 2008, 2012   Dr.Aplington bilateral   TONSILLECTOMY     TRANSURETHRAL RESECTION OF PROSTATE      BPH;Dr Dahlstedt     Current Outpatient Medications  Medication Sig Dispense Refill   acetaminophen  (TYLENOL ) 500 MG tablet Take 1,000 mg by mouth 2 (two) times daily as needed for moderate pain, fever or headache.     enoxaparin  (LOVENOX ) 150 MG/ML injection Inject 1 mL (150 mg total) into the skin daily. USE AS DIRECTED BY ANTICOAGULATION CLINIC 8 mL 0   finasteride  (PROSCAR ) 5 MG tablet Take 1 tablet (5 mg total) by mouth daily. 90 tablet 3   folic acid (FOLVITE) 400 MCG tablet Take 400 mcg by mouth daily.     ipratropium (ATROVENT ) 0.03 % nasal spray Place 2 sprays into both nostrils daily as needed.     losartan -hydrochlorothiazide  (HYZAAR) 100-25 MG tablet Take 1 tablet by mouth daily. 90 tablet 3   Omega-3 Fatty Acids (FISH OIL PO) Take 1,400 mg by mouth daily.     omeprazole  (PRILOSEC) 40 MG capsule Take 1 capsule (40 mg total) by mouth daily as needed. 90 capsule 0  potassium chloride  SA (KLOR-CON  M) 20 MEQ tablet Take 1 tablet (20 mEq total) by mouth every other day. 45 tablet 3   primidone  (MYSOLINE ) 50 MG tablet Take 2 tablets (100 mg total) by mouth at bedtime. 180 tablet 1   propranolol  ER (INDERAL  LA) 120 MG 24 hr capsule Take 1 capsule (120 mg total) by mouth daily. 90 capsule 3   simvastatin  (ZOCOR ) 10 MG tablet Take 1 tablet (10 mg total) by mouth daily. 90 tablet 3   cholecalciferol (VITAMIN D3) 25 MCG (1000 UNIT) tablet Take 1,000 Units by mouth daily.     diazepam  (VALIUM ) 5 MG tablet TAKE 1/2 TO 1 TABLET BY MOUTH AT BEDTIME AS NEEDED FOR SLEEP 30 tablet 2   warfarin (COUMADIN ) 5 MG tablet TAKE 1/2 TABLET BY MOUTH DAILY EXCEPT TAKE 1 TABLET ON TUESDAY, THURSDAY AND SATURDAYS OR AS DIRECTED BY ANTICOAGULATION CLINIC 90 tablet 1   Current Facility-Administered Medications  Medication Dose Route Frequency Provider Last  Rate Last Admin   0.9 %  sodium chloride  infusion  500 mL Intravenous Once Avram Lupita BRAVO, MD        Allergies as of 06/08/2024 - Review Complete 06/08/2024  Allergen Reaction Noted   Amlodipine besy-benazepril hcl Other (See Comments)    Lipitor [atorvastatin calcium] Other (See Comments) 03/21/2011    Family History  Problem Relation Age of Onset   Pancreatic cancer Father 74   Ulcers Father    Stroke Father 55   Diabetes Father    Coronary artery disease Mother    Osteoporosis Mother    HIV Brother    Multiple sclerosis Sister    Heart attack Paternal Grandfather        early 57s   Heart attack Paternal Uncle        2 uncles in early 108s   Aortic aneurysm Brother    Colon cancer Neg Hx    Esophageal cancer Neg Hx    Stomach cancer Neg Hx     Social History   Socioeconomic History   Marital status: Married    Spouse name: Not on file   Number of children: 4   Years of education: Not on file   Highest education level: Some college, no degree  Occupational History   Occupation: Retired    Associate Professor: RETIRED  Tobacco Use   Smoking status: Former    Current packs/day: 0.00    Average packs/day: 2.0 packs/day for 40.0 years (80.0 ttl pk-yrs)    Types: Cigarettes    Start date: 08/18/1954    Quit date: 08/18/1994    Years since quitting: 29.8   Smokeless tobacco: Never   Tobacco comments:    smoked 1956-1996, up to 2 ppd  Vaping Use   Vaping status: Never Used  Substance and Sexual Activity   Alcohol use: Yes    Alcohol/week: 1.0 standard drink of alcohol    Types: 1 Cans of beer per week    Comment: 1 beer per week   Drug use: No   Sexual activity: Not Currently    Birth control/protection: None    Comment: Married  Other Topics Concern   Not on file  Social History Narrative   Married, lives with spouse   Married x56 years   4 daughters   4 grandchildren   Retired - Psychologist, sport and exercise, sales/technology   Social Drivers of Health   Financial Resource  Strain: Low Risk  (05/13/2024)   Overall Financial Resource Strain (CARDIA)  Difficulty of Paying Living Expenses: Not hard at all  Food Insecurity: No Food Insecurity (05/13/2024)   Hunger Vital Sign    Worried About Running Out of Food in the Last Year: Never true    Ran Out of Food in the Last Year: Never true  Transportation Needs: No Transportation Needs (05/13/2024)   PRAPARE - Administrator, Civil Service (Medical): No    Lack of Transportation (Non-Medical): No  Physical Activity: Insufficiently Active (05/13/2024)   Exercise Vital Sign    Days of Exercise per Week: 3 days    Minutes of Exercise per Session: 20 min  Stress: No Stress Concern Present (05/13/2024)   [employer] of Occupational Health - Occupational Stress Questionnaire    Feeling of Stress: Not at all  Social Connections: Socially Integrated (05/13/2024)   Social Connection and Isolation Panel    Frequency of Communication with Friends and Family: More than three times a week    Frequency of Social Gatherings with Friends and Family: Twice a week    Attends Religious Services: More than 4 times per year    Active Member of [employer] or Organizations: Yes    Attends Engineer, structural: More than 4 times per year    Marital Status: Married  Catering manager Violence: Not At Risk (05/21/2023)   Humiliation, Afraid, Rape, and Kick questionnaire    Fear of Current or Ex-Partner: No    Emotionally Abused: No    Physically Abused: No    Sexually Abused: No    Review of Systems:  All other review of systems negative except as mentioned in the HPI.  Physical Exam: Vital signs BP 137/64 (BP Location: Right Arm, Patient Position: Sitting, Cuff Size: Normal)   Pulse 62   Temp (!) 97.2 F (36.2 C) (Temporal)   Ht 5' 10 (1.778 m)   Wt 195 lb (88.5 kg)   SpO2 95%   BMI 27.98 kg/m   General:   Alert,  Well-developed, well-nourished, pleasant and cooperative in NAD Lungs:  Clear throughout  to auscultation.   Heart:  Regular rate and rhythm; no murmurs, clicks, rubs,  or gallops. Abdomen:  Soft, nontender and nondistended. Normal bowel sounds.   Neuro/Psych:  Alert and cooperative. Normal mood and affect. A and O x 3   @Cashius Grandstaff  CHARLENA Commander, MD, [facility] [phone] (pager) 06/08/2024 2:46 PM@

## 2024-06-08 ENCOUNTER — Ambulatory Visit (AMBULATORY_SURGERY_CENTER): Admitting: Internal Medicine

## 2024-06-08 ENCOUNTER — Encounter: Payer: Self-pay | Admitting: Internal Medicine

## 2024-06-08 VITALS — BP 150/77 | HR 57 | Temp 97.2°F | Resp 15 | Ht 70.0 in | Wt 195.0 lb

## 2024-06-08 DIAGNOSIS — K5730 Diverticulosis of large intestine without perforation or abscess without bleeding: Secondary | ICD-10-CM | POA: Diagnosis not present

## 2024-06-08 DIAGNOSIS — D122 Benign neoplasm of ascending colon: Secondary | ICD-10-CM

## 2024-06-08 DIAGNOSIS — R197 Diarrhea, unspecified: Secondary | ICD-10-CM | POA: Diagnosis not present

## 2024-06-08 DIAGNOSIS — I10 Essential (primary) hypertension: Secondary | ICD-10-CM | POA: Diagnosis not present

## 2024-06-08 DIAGNOSIS — K648 Other hemorrhoids: Secondary | ICD-10-CM

## 2024-06-08 MED ORDER — SODIUM CHLORIDE 0.9 % IV SOLN: 500.0000 mL | Freq: Once | INTRAVENOUS | Status: DC

## 2024-06-08 NOTE — Patient Instructions (Addendum)
 Please read handouts provided. Continue present medications. Await pathology results. Resume Coumadin  ( warfarin ) tomorrow and Lovenox  ( enoxaparin  )  tomorrow at prior doses.    YOU HAD AN ENDOSCOPIC PROCEDURE TODAY AT THE [facility]:   Refer to the procedure report that was given to you for any specific questions about what was found during the examination.  If the procedure report does not answer your questions, please call your gastroenterologist to clarify.  If you requested that your care partner not be given the details of your procedure findings, then the procedure report has been included in a sealed envelope for you to review at your convenience later.  YOU SHOULD EXPECT: Some feelings of bloating in the abdomen. Passage of more gas than usual.  Walking can help get rid of the air that was put into your GI tract during the procedure and reduce the bloating. If you had a lower endoscopy (such as a colonoscopy or flexible sigmoidoscopy) you may notice spotting of blood in your stool or on the toilet paper. If you underwent a bowel prep for your procedure, you may not have a normal bowel movement for a few days.  Please Note:  You might notice some irritation and congestion in your nose or some drainage.  This is from the oxygen used during your procedure.  There is no need for concern and it should clear up in a day or so.  SYMPTOMS TO REPORT IMMEDIATELY:  Following lower endoscopy (colonoscopy or flexible sigmoidoscopy):  Excessive amounts of blood in the stool  Significant tenderness or worsening of abdominal pains  Swelling of the abdomen that is new, acute  Fever of 100F or higher  Following upper endoscopy (EGD)  Vomiting of blood or coffee ground material  New chest pain or pain under the shoulder blades  Painful or persistently difficult swallowing  New shortness of breath  Fever of 100F or higher  Black, tarry-looking stools  For urgent or emergent issues,  a gastroenterologist can be reached at any hour by calling [phone]. Do not use MyChart messaging for urgent concerns.    DIET:  We do recommend a small meal at first, but then you may proceed to your regular diet.  Drink plenty of fluids but you should avoid alcoholic beverages for 24 hours.  ACTIVITY:  You should plan to take it easy for the rest of today and you should NOT DRIVE or use heavy machinery until tomorrow (because of the sedation medicines used during the test).    FOLLOW UP: Our staff will call the number listed on your records the next business day following your procedure.  We will call around 7:15- 8:00 am to check on you and address any questions or concerns that you may have regarding the information given to you following your procedure. If we do not reach you, we will leave a message.     If any biopsies were taken you will be contacted by phone or by letter within the next 1-3 weeks.  Please call us  at [phone] if you have not heard about the biopsies in 3 weeks.    SIGNATURES/CONFIDENTIALITY: You and/or your care partner have signed paperwork which will be entered into your electronic medical record.  These signatures attest to the fact that that the information above on your After Visit Summary has been reviewed and is understood.  Full responsibility of the confidentiality of this discharge information lies with you and/or your care-partner.I found and  removed 1 very small polyp that looks benign. Also saw diverticulosis and hemorrhoids which you have previously.  I took random biopsies of the intestine to look for microscopic inflammation which could explain your diarrhea and I will let you know all of the biopsy results and recommendations.  Please take warfarin and Lovenox  as outlined below written out by the anticoagulation clinic:    10/23 1/2 tablets (7.5 mg) warfarin, inject Lovenox  once in the AM  10/24: Take 1 tablet (5 mg) warfarin, inject  Lovenox  once in the AM  10/25: Take 1 1/2 tablets (7.5 mg) warfarin, inject Lovenox  once in the AM  10/26: Take 1/2 tablet (2.5 mg) warfarin, inject Lovenox  once in the AM  10/27: Take 1/2 tablet (25 mg) warfarin, inject Lovenox  once in the AM  10/28: Recheck INR; NO WARFARIN AND NO LOVENOX  UNTIL AFTER INR CHECK   I appreciate the opportunity to care for you. Lupita CHARLENA Commander, MD, [facility]

## 2024-06-08 NOTE — Progress Notes (Signed)
 Called to room to assist during endoscopic procedure.  Patient ID and intended procedure confirmed with present staff. Received instructions for my participation in the procedure from the performing physician.

## 2024-06-08 NOTE — Progress Notes (Signed)
 Vss nad trans to pacu

## 2024-06-08 NOTE — Op Note (Signed)
 [facility] Patient Name: Patrick Hatfield Procedure Date: 06/08/2024 2:36 PM MRN: [id number] Endoscopist: Lupita FORBES Commander , MD, [id number] Age: 89 Referring MD:  Date of Birth: 1934-10-27 Gender: Male Account #: [id number] Procedure:                Colonoscopy Indications:              Clinically significant diarrhea of unexplained                            origin Medicines:                Monitored Anesthesia Care Procedure:                Pre-Anesthesia Assessment:                           - Prior to the procedure, a History and Physical                            was performed, and patient medications and                            allergies were reviewed. The patient's tolerance of                            previous anesthesia was also reviewed. The risks                            and benefits of the procedure and the sedation                            options and risks were discussed with the patient.                            All questions were answered, and informed consent                            was obtained. Prior Anticoagulants: The patient                            last took Coumadin  (warfarin) 5 days and Lovenox                             (enoxaparin ) 1 day prior to the procedure. ASA                            Grade Assessment: III - A patient with severe                            systemic disease. After reviewing the risks and                            benefits, the patient was deemed in satisfactory  condition to undergo the procedure.                           After obtaining informed consent, the colonoscope                            was passed under direct vision. Throughout the                            procedure, the patient's blood pressure, pulse, and                            oxygen saturations were monitored continuously. The                            Olympus Scope SN: [id number] was introduced through                             the anus and advanced to the the terminal ileum,                            with identification of the appendiceal orifice and                            IC valve. The colonoscopy was performed without                            difficulty. The patient tolerated the procedure                            well. The quality of the bowel preparation was                            good. The terminal ileum, ileocecal valve,                            appendiceal orifice, and rectum were photographed. Scope In: 2:51:17 PM Scope Out: 3:14:56 PM Scope Withdrawal Time: 0 hours 14 minutes 5 seconds  Total Procedure Duration: 0 hours 23 minutes 39 seconds  Findings:                 The perianal and digital rectal examinations were                            normal.                           The terminal ileum appeared normal.                           A 5 mm polyp was found in the ascending colon. The                            polyp was sessile. The polyp was removed with a  cold snare. Resection and retrieval were complete.                            Verification of patient identification for the                            specimen was done. Estimated blood loss was minimal.                           Multiple diverticula were found in the sigmoid                            colon. There was narrowing of the colon in                            association with the diverticular opening.                           Internal hemorrhoids were found.                           The exam was otherwise without abnormality on                            direct and retroflexion views.                           Biopsies for histology were taken with a cold                            forceps from the right colon and left colon for                            evaluation of microscopic colitis. Estimated blood                            loss was minimal. Complications:             No immediate complications. Estimated Blood Loss:     Estimated blood loss was minimal. Impression:               - The examined portion of the ileum was normal.                           - One 5 mm polyp in the ascending colon, removed                            with a cold snare. Resected and retrieved.                           - Severe diverticulosis in the sigmoid colon. There                            was narrowing of the colon in association with the  diverticular opening.                           - Internal hemorrhoids.                           - The examination was otherwise normal on direct                            and retroflexion views.                           - Biopsies were taken with a cold forceps from the                            right colon and left colon for evaluation of                            microscopic colitis. Recommendation:           - Patient has a contact number available for                            emergencies. The signs and symptoms of potential                            delayed complications were discussed with the                            patient. Return to normal activities tomorrow.                            Written discharge instructions were provided to the                            patient.                           - Resume previous diet.                           - Continue present medications.                           - Await pathology results.                           - No repeat colonoscopy due to age.                           - Resume Coumadin  (warfarin) tomorrow and Lovenox                             (enoxaparin ) tomorrow at prior doses. Refer to                            Coumadin  Clinic for further adjustment of therapy.  Lupita FORBES Commander, MD 06/08/2024 3:25:42 PM This report has been signed electronically.

## 2024-06-09 ENCOUNTER — Telehealth: Payer: Self-pay

## 2024-06-09 NOTE — Telephone Encounter (Signed)
 No answer on follow-up call. Left VM for pt.

## 2024-06-13 ENCOUNTER — Encounter: Payer: Self-pay | Admitting: Family Medicine

## 2024-06-13 ENCOUNTER — Other Ambulatory Visit: Payer: Self-pay | Admitting: Family Medicine

## 2024-06-13 DIAGNOSIS — K219 Gastro-esophageal reflux disease without esophagitis: Secondary | ICD-10-CM

## 2024-06-13 DIAGNOSIS — D6851 Activated protein C resistance: Secondary | ICD-10-CM

## 2024-06-13 LAB — SURGICAL PATHOLOGY

## 2024-06-14 ENCOUNTER — Ambulatory Visit

## 2024-06-14 ENCOUNTER — Ambulatory Visit: Payer: Self-pay

## 2024-06-14 DIAGNOSIS — Z7901 Long term (current) use of anticoagulants: Secondary | ICD-10-CM | POA: Diagnosis not present

## 2024-06-14 LAB — POCT INR: INR: 1.5 — AB (ref 2.0–3.0)

## 2024-06-14 MED ORDER — APIXABAN 5 MG PO TABS: 5.0000 mg | ORAL_TABLET | Freq: Two times a day (BID) | ORAL | 3 refills | Status: AC

## 2024-06-14 NOTE — Telephone Encounter (Signed)
 Pt reports he has contacted his insurance and they report he has not met his deductible until after this purchase. So, he has now met his deductible for the year and the cost will be $47/month.  He reports at the beginning of the year it will restart the deductible requirement but he will meet the deductible after the first two months. He reports this is affordable for him and he will start Eliquis tonight. Advised if any changes feel free to contact the coumadin  clinic. Pt appreciative and verbalized understanding.

## 2024-06-14 NOTE — Telephone Encounter (Signed)
 Will transition to Eliquis 5 twice daily.  Epic system indicated that his dosage should be 2.5 twice daily, however patient is over 60 kg and creatinine is less than 1.5.  I called and discussed with Pharm.D. who confirmed his dosage should be 5 mg twice daily

## 2024-06-14 NOTE — Progress Notes (Signed)
 Pt is transitioning to Eliquis. Had to open a new anticoagulation encounter to resolve episodes. Anticoagulation episodes resolved.

## 2024-06-14 NOTE — Addendum Note (Signed)
 Addended by: WATT RAISIN C on: 06/14/2024 12:34 PM   Modules accepted: Orders

## 2024-06-14 NOTE — Patient Instructions (Signed)
Pre visit review using our clinic review tool, if applicable. No additional management support is needed unless otherwise documented below in the visit note. 

## 2024-06-14 NOTE — Progress Notes (Signed)
 Pt had a colonoscopy on 10/22 and was placed on a Lovenox  bridge. Pt has sent a msg to PCP requesting to transition from warfarin to Eliquis. He has contacted his insurance and this will be affordable for him. Discussed risk vs benefit of transitioning to Eliquis. Do think benefit out-weighs risk for this pt. For transition, INR must be < 2.0 and INR today is 1.5 Sent msg to PCP who is not in the office today. Advised pt this nurse will f/u with him this afternoon after receiving msg from PCP. Advised at this time to hold warfarin until contacted.

## 2024-06-14 NOTE — Telephone Encounter (Signed)
 Contacted pt and advised of script. He reports he did pick up the 30 day supply today but the cost was $143 and not the quoted amount of $47 a month when he talked to the insurance company.   He did pick up the script and will start it this evening. Pt was advised to take 1 tablet twice daily but only one tablet this evening and start the twice daily tomorrow. Advised to stop warfarin and he could dispose of the warfarin at a [facility] pharmacy if needed.  Pt reports he is going to f/u with his insurance company concerning the cost. Advised it could be because he has not met his deductible yet. He reports the person he talked to did not mention anything about the deductible, only the monthly cost.  Advised to f/u with the coumadin  clinic or PCP office concerning if the medication is unaffordable. Pt reports he is going to contact the insurance company today.

## 2024-06-14 NOTE — Telephone Encounter (Signed)
 LVM

## 2024-06-17 ENCOUNTER — Ambulatory Visit: Payer: Self-pay | Admitting: Internal Medicine

## 2024-06-17 MED ORDER — COLESTIPOL HCL 1 G PO TABS: 2.0000 g | ORAL_TABLET | Freq: Every day | ORAL | 3 refills | Status: AC

## 2024-06-19 ENCOUNTER — Encounter: Payer: Self-pay | Admitting: Internal Medicine

## 2024-07-10 ENCOUNTER — Encounter: Payer: Self-pay | Admitting: Family Medicine

## 2024-07-11 ENCOUNTER — Ambulatory Visit (INDEPENDENT_AMBULATORY_CARE_PROVIDER_SITE_OTHER): Admitting: *Deleted

## 2024-07-11 VITALS — Ht 70.0 in | Wt 191.0 lb

## 2024-07-11 DIAGNOSIS — Z0000 Encounter for general adult medical examination without abnormal findings: Secondary | ICD-10-CM

## 2024-07-11 NOTE — Patient Instructions (Signed)
 Mr. Minehart,  Thank you for taking the time for your Medicare Wellness Visit. I appreciate your continued commitment to your health goals. Please review the care plan we discussed, and feel free to reach out if I can assist you further.  Please note that Annual Wellness Visits do not include a physical exam. Some assessments may be limited, especially if the visit was conducted virtually. If needed, we may recommend an in-person follow-up with your provider.  Ongoing Care Seeing your primary care provider every 3 to 6 months helps us  monitor your health and provide consistent, personalized care.   Dr Watt:  11/07/24 10:20am Annual Wellness Visit:  07/18/25 10:20am, telephone  Recommended Screenings:  Health Maintenance  Topic Date Due   Medicare Annual Wellness Visit  05/20/2024   COVID-19 Vaccine (4 - 2025-26 season) 07/11/2025*   DTaP/Tdap/Td vaccine (4 - Td or Tdap) 03/07/2025   Pneumococcal Vaccine for age over 36  Completed   Flu Shot  Completed   Zoster (Shingles) Vaccine  Completed   Meningitis B Vaccine  Aged Out  *Topic was postponed. The date shown is not the original due date.       07/10/2024    3:27 PM  Advanced Directives  Does Patient Have a Medical Advance Directive? Yes  Type of Estate Agent of [place];Living will  Does patient want to make changes to medical advance directive? No - Patient declined  Copy of Healthcare Power of Attorney in Chart? No - copy requested   Please bring a copy of your health care power of attorney and living will to the office to be added to your chart at your convenience. You can mail a copy to [facility] [address] [place], [place] [address] or email to [email or web address]  Vision: Annual vision screenings are recommended for early detection of glaucoma, cataracts, and diabetic retinopathy. These exams can also reveal signs of chronic conditions such as diabetes and high blood  pressure.  Dental: Annual dental screenings help detect early signs of oral cancer, gum disease, and other conditions linked to overall health, including heart disease and diabetes.  Please see the attached documents for additional preventive care recommendations.

## 2024-07-11 NOTE — Progress Notes (Signed)
 Chief Complaint  Patient presents with   Medicare Wellness     Subjective:   Patrick Hatfield is a 89 y.o. male who presents for a Medicare Annual Wellness Visit.  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   Depression 2006   drug overdose   Diverticulosis    DVT (deep venous thrombosis) (HCC) 1991 & 2010   X 2 in context Factor 5 def   Esophageal reflux    Factor 5 Leiden mutation, heterozygous    Gilbert's syndrome    H/O ischemic bowel disease 2010   while off warfarin    Herpes zoster 2010   facial   Hiatal hernia    Hx of adenomatous colonic polyps 12/14/2014   Hyperlipidemia    Hypertension    Insomnia    Osteoporosis    Personal history of colonic polyps 05/28/2007   adenomatous   Proteinuria age 61   Sciatica    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   KNEE ARTHROSCOPY Bilateral 2008, 2012   Dr.Aplington bilateral   TONSILLECTOMY     TRANSURETHRAL RESECTION OF PROSTATE      BPH;Dr Dahlstedt   Family History  Problem Relation Age of Onset   Pancreatic cancer Father 31   Ulcers Father    Stroke Father 24   Diabetes Father    Coronary artery disease Mother    Osteoporosis Mother    HIV Brother    Multiple sclerosis Sister    Heart attack Paternal Grandfather        early 19s   Heart attack Paternal Uncle         2 uncles in early 23s   Aortic aneurysm Brother    Colon cancer Neg Hx    Esophageal cancer Neg Hx    Stomach cancer Neg Hx    Social History   Occupational History   Occupation: Retired    Associate Professor: RETIRED  Tobacco Use   Smoking status: Former    Current packs/day: 0.00    Average packs/day: 2.0 packs/day for 40.0 years (80.0 ttl pk-yrs)    Types: Cigarettes    Start date: 08/18/1954    Quit date: 08/18/1994    Years since quitting: 29.9   Smokeless tobacco: Never   Tobacco comments:    smoked 1956-1996, up to 2 ppd  Vaping Use   Vaping status: Never Used  Substance and Sexual Activity   Alcohol use: Yes    Alcohol/week: 3.0 standard drinks of alcohol    Types: 2 Glasses of wine, 1 Cans of beer per week    Comment: 1 beer per week,. 2 glasses of wine 2 days a week   Drug use:  No   Sexual activity: Not Currently    Birth control/protection: None    Comment: Married   Tobacco Counseling Counseling given: Not Answered Tobacco comments: smoked 1956-1996, up to 2 ppd  SDOH Screenings   Food Insecurity: No Food Insecurity (07/10/2024)  Housing: Low Risk  (07/10/2024)  Transportation Needs: No Transportation Needs (07/10/2024)  Utilities: Not At Risk (07/11/2024)  Alcohol Screen: Low Risk  (07/10/2024)  Depression (PHQ2-9): Low Risk  (07/11/2024)  Financial Resource Strain: Low Risk  (07/10/2024)  Physical Activity: Inactive (07/10/2024)  Social Connections: Socially Integrated (07/10/2024)  Stress: No Stress Concern Present (07/10/2024)  Tobacco Use: Medium Risk (07/11/2024)  Health Literacy: Adequate Health Literacy (05/21/2023)   See flowsheets for full screening details  Depression Screen PHQ 2 & 9 Depression Scale- Over the past 2 weeks, how often have you been bothered by any of the following problems? Little interest or pleasure in doing things: 0 Feeling down, depressed, or hopeless (PHQ Adolescent also includes...irritable): 0 PHQ-2 Total Score:  0 Trouble falling or staying asleep, or sleeping too much: 0 Feeling tired or having little energy: 0 Poor appetite or overeating (PHQ Adolescent also includes...weight loss): 0 Feeling bad about yourself - or that you are a failure or have let yourself or your family down: 0 Trouble concentrating on things, such as reading the newspaper or watching television (PHQ Adolescent also includes...like school work): 0 Moving or speaking so slowly that other people could have noticed. Or the opposite - being so fidgety or restless that you have been moving around a lot more than usual: 0 Thoughts that you would be better off dead, or of hurting yourself in some way: 0 PHQ-9 Total Score: 0 If you checked off any problems, how difficult have these problems made it for you to do your work, take care of things at home, or get along with other people?: Not difficult at all     Goals Addressed             This Visit's Progress    Maintain healthy active lifestyle.    On track      Visit info / Clinical Intake: Medicare Wellness Visit Type:: Subsequent Annual Wellness Visit Persons participating in visit:: patient Medicare Wellness Visit Mode:: Telephone If telephone:: video declined Because this visit was a virtual/telehealth visit:: pt reported vitals If Telephone or Video please confirm:: I connected with the patient using audio enabled telemedicine application and verified that I am speaking with the correct person using two identifiers; I discussed the limitations of evaluation and management by telemedicine; The patient expressed understanding and agreed to proceed Patient Location:: home Provider Location:: office Information given by:: patient Interpreter Needed?: No Pre-visit prep was completed: yes AWV questionnaire completed by patient prior to visit?: yes Date:: 07/10/24 Living arrangements:: lives with spouse/significant other Patient's Overall Health Status Rating:  excellent Typical amount of pain: none Does pain affect daily life?: no Are you currently prescribed opioids?: no  Dietary Habits and Nutritional Risks How many meals a day?: 2 Eats fruit and vegetables daily?: yes Most meals are obtained by: preparing own meals In the last 2 weeks, have you had any of the following?: (!) nausea, vomiting, diarrhea (had colonoscopy and still ongoing issue) Diabetic:: no  Functional Status Activities of Daily Living (to include ambulation/medication): (Patient-Rptd) Independent Ambulation: (Patient-Rptd) Independent Medication Administration: Independent Home Management: (Patient-Rptd) Independent Manage your own finances?: yes Primary transportation is: driving Concerns about vision?: no *vision screening is required for WTM* (due  for eye exam with [facility]) Concerns about hearing?: no (wears hearing aids)  Fall Screening Falls in the past year?: (Patient-Rptd) 0 Number of falls in past year: 0 Was there an injury with Fall?: (Patient-Rptd) 0 Fall Risk Category Calculator: 0 Patient Fall Risk Level: Low Fall Risk  Fall Risk Patient at Risk for Falls Due to: No Fall Risks Fall risk Follow up: Falls evaluation completed  Home and Transportation Safety: All rugs have non-skid backing?: (!) no All stairs or steps have railings?: (!) no Grab bars in the bathtub or shower?: yes Have non-skid surface in bathtub or shower?: yes Good home lighting?: yes Regular seat belt use?: yes Hospital stays in the last year:: no  Cognitive Assessment Difficulty concentrating, remembering, or making decisions? : no Will 6CIT or Mini Cog be Completed: yes What year is it?: 0 points What month is it?: 0 points Give patient an address phrase to remember (5 components): [address] [place]  About what time is it?: 3 points (1 hr off) Count backwards from 20 to 1: 0 points Say the months of the year in reverse: 0 points Repeat the  address phrase from earlier: 2 points 6 CIT Score: 5 points  Advance Directives (For Healthcare) Does Patient Have a Medical Advance Directive?: Yes Does patient want to make changes to medical advance directive?: No - Patient declined Type of Advance Directive: Healthcare Power of [place]; Living will Copy of Healthcare Power of Attorney in Chart?: No - copy requested Copy of Living Will in Chart?: No - copy requested  Reviewed/Updated  Reviewed/Updated: Reviewed All (Medical, Surgical, Family, Medications, Allergies, Care Teams, Patient Goals)        Objective:    Today's Vitals   07/11/24 1029  Weight: 191 lb (86.6 kg)  Height: 5' 10 (1.778 m)   Body mass index is 27.41 kg/m.  Current Medications (verified) Outpatient Encounter Medications as of 07/11/2024  Medication Sig   acetaminophen  (TYLENOL ) 500 MG tablet Take 1,000 mg by mouth 2 (two) times daily as needed for moderate pain, fever or headache.   apixaban  (ELIQUIS ) 5 MG TABS tablet Take 1 tablet (5 mg total) by mouth 2 (two) times daily.   cholecalciferol (VITAMIN D3) 25 MCG (1000 UNIT) tablet Take 1,000 Units by mouth daily.   diazepam  (VALIUM ) 5 MG tablet TAKE 1/2 TO 1 TABLET BY MOUTH AT BEDTIME AS NEEDED FOR SLEEP   finasteride  (PROSCAR ) 5 MG tablet Take 1 tablet (5 mg total) by mouth daily.   folic acid (FOLVITE) 400 MCG tablet Take 400 mcg by mouth daily.   ipratropium (ATROVENT ) 0.03 % nasal spray Place 2 sprays into both nostrils daily as needed.   losartan -hydrochlorothiazide  (HYZAAR) 100-25 MG tablet Take 1 tablet by mouth daily.   Omega-3 Fatty Acids (FISH OIL PO) Take 1,400 mg by mouth daily. (Patient taking differently: Take 1,250 mg by mouth daily.)   omeprazole  (PRILOSEC) 40 MG capsule TAKE 1 CAPSULE BY MOUTH DAILY AS NEEDED.   potassium chloride  SA (KLOR-CON  M) 20 MEQ tablet Take 1 tablet (20 mEq total) by mouth every other day.   primidone  (MYSOLINE ) 50 MG tablet Take 2 tablets (100 mg total) by  mouth at bedtime.   Probiotic Product (PROBIOTIC DAILY PO) Take 1 capsule by mouth every morning.   propranolol  ER (INDERAL  LA) 120 MG 24 hr capsule Take 1 capsule (120 mg total) by mouth daily.   simvastatin  (ZOCOR ) 10 MG tablet Take 1 tablet (10 mg total) by mouth daily.  colestipol  (COLESTID ) 1 g tablet Take 2 tablets (2 g total) by mouth daily with lunch. (Patient not taking: Reported on 07/11/2024)   No facility-administered encounter medications on file as of 07/11/2024.   Hearing/Vision screen No results found. Immunizations and Health Maintenance Health Maintenance  Topic Date Due   COVID-19 Vaccine (4 - 2025-26 season) 07/11/2025 (Originally 04/18/2024)   DTaP/Tdap/Td (4 - Td or Tdap) 03/07/2025   Medicare Annual Wellness (AWV)  07/11/2025   Pneumococcal Vaccine: 50+ Years  Completed   Influenza Vaccine  Completed   Zoster Vaccines- Shingrix   Completed   Meningococcal B Vaccine  Aged Out        Assessment/Plan:  This is a routine wellness examination for Kyal.  Patient Care Team: Copland, Harlene BROCKS, MD as PCP - General (Family Medicine) Heide Ingle, MD as Consulting Physician (Orthopedic Surgery) Avram Lupita BRAVO, MD as Consulting Physician (Gastroenterology) Mannam, Praveen, MD as Consulting Physician (Pulmonary Disease) [facility], [facility]  I have personally reviewed and noted the following in the patient's chart:   Medical and social history Use of alcohol, tobacco or illicit drugs  Current medications and supplements including opioid prescriptions. Functional ability and status Nutritional status Physical activity Advanced directives List of other physicians Hospitalizations, surgeries, and ER visits in previous 12 months Vitals Screenings to include cognitive, depression, and falls Referrals and appointments  No orders of the defined types were placed in this encounter.  In addition, I have reviewed and discussed with patient certain  preventive protocols, quality metrics, and best practice recommendations. A written personalized care plan for preventive services as well as general preventive health recommendations were provided to patient.   Lolita Libra, CMA   07/11/2024   Return in 1 year (on 07/11/2025).  After Visit Summary: (MyChart) Due to this being a telephonic visit, the after visit summary with patients personalized plan was offered to patient via MyChart   Nurse Notes: nothing significant to report

## 2024-07-19 ENCOUNTER — Ambulatory Visit: Admitting: Gastroenterology

## 2024-07-19 ENCOUNTER — Encounter: Payer: Self-pay | Admitting: Gastroenterology

## 2024-07-19 VITALS — BP 122/64 | HR 64 | Ht 70.0 in | Wt 193.4 lb

## 2024-07-19 DIAGNOSIS — K529 Noninfective gastroenteritis and colitis, unspecified: Secondary | ICD-10-CM | POA: Diagnosis not present

## 2024-07-19 DIAGNOSIS — R197 Diarrhea, unspecified: Secondary | ICD-10-CM

## 2024-07-19 NOTE — Progress Notes (Signed)
 Patrick Hatfield [id number] 01-22-1935   Chief Complaint: Diarrhea  Referring Provider: Watt Harlene BROCKS, MD Primary GI MD: Dr. Avram  HPI: Patrick Hatfield is a 89 y.o. male with past medical history of anxiety/depression, arthritis, asthma, Barrett's esophagus, adenomatous colon polyps, diverticulosis, history of DVT, GERD, factor V Leiden mutation, Gilbert's syndrome, history of ischemic bowel disease, hiatal hernia, HLD, HTN, osteoporosis, mesenteric vein thrombosis 2014, cholecystectomy 2012, TURP who presents today for follow up.    Seen in office 12/28/2019 by Elida Shawl, NP for [name] diarrhea.  Had been treated with vancomycin  125 mg 4 times daily for 2 weeks and Florastor probiotic.  Diarrhea recurred after completing 2-week course of vanc.  He was then restarted on 10-day course of vancomycin  12/26/2019.  Advised to continue current regimen for 10 days as prescribed by Dr. Avram, then Vancomycin  125 mg 1 p.o. 3 times daily for 7 days then Vancomycin  125 mg 1 p.o. twice daily for 7 days then Vancomycin  125 mg 1 p.o. daily for 7 days then Vancomycin  125 mg 1 p.o. every third day for 6 weeks # 56, no refills.  Also advised to continue Florastor for 6 to 8 weeks as tolerated.   Patient does have history of colon polyps but no further colonoscopies have been recommended due to age.   History of GERD with dilation of distal esophageal ring at time of EGD in 2016, and no Barrett's esophagus.   03/22/2024 patient endorsed several months of loose stools with frequent gas, worsening over the previous few weeks along with abdominal bloating and distention.  No improvement with OTC Imodium.  I saw patient 04/04/2024 for 6 months of intermittent diarrhea which had increased in frequency, fecal urgency, fecal incontinence, and occasional bloating.  Ordered labs and stool studies.  Dr. Avram advised to consider antibiotics to treat SIBO.  Labs are stable.  Mild elevation in  bilirubin consistent with prior diagnosis of Gilbert's syndrome.  [name] was negative.  GI pathogen panel not collected.  Patient was treated with metronidazole  250 mg 3 times daily for 10 days.  Patient completed treatment but later had a recurrence of severe diarrhea.  He was scheduled for follow-up office visit.  Seen by [name], PA-C 05/05/2024.  Continued to have significant diarrhea and was scheduled for colonoscopy to rule out microscopic colitis, with consideration for trial of Questran  due to history of cholecystectomy, fecal pancreatic elastase to rule out EPI.  Underwent colonoscopy 06/08/2024.  1 tubular adenoma was removed from the ascending colon.  Pathology was negative for microscopic colitis.  Dr. Avram prescribed colestipol  for patient to take 2 tablets with lunch.  Scheduled for follow-up.  Patient sent a message 06/19/2024 endorsing having an extreme episode of diarrhea after which she took a single dose of Imodium and subsequently became constipated.  Was able to painfully pass a firm amount of stool with a little blood which he attributed to hemorrhoids.  Was advised to continue to monitor bowel habits.   Discussed the use of AI scribe software for clinical note transcription with the patient, who gave verbal consent to proceed.  History of Present Illness Patrick Hatfield is an 89 year old male who presents for follow-up of diarrhea.  Bowel movement regularity and stool consistency - Bowel movements occur every day or every other day since starting colestipol  - Stools are more solid than before - No loose stools for at least two weeks - No constipation or hard stools -  Able to go out without fear of diarrhea  Gastrointestinal symptoms - No abdominal pain - No nausea - No vomiting - No constipation  Colestipol  therapy - Takes colestipol  two tablets once daily, typically around 2:30 PM within an hour after lunch - Most other medications are taken  before 8 AM - Eliquis  is taken at 6:30 PM, ensuring a four-hour gap between medications - Occasionally takes Tylenol  with lunch - Finds the cost of colestipol  manageable   Previous GI Procedures/Imaging   Colonoscopy 06/08/2024 - The examined portion of the ileum was normal.  - One 5 mm polyp in the ascending colon, removed with a cold snare. Resected and retrieved.  - Severe diverticulosis in the sigmoid colon. There was narrowing of the colon in association with the diverticular opening. Path: 1. Surgical [P], colon, ascending, polyp (1) :       - TUBULAR ADENOMA.       - NO HIGH GRADE DYSPLASIA OR MALIGNANCY.        2. Surgical [P], random colon biopsy :       - BENIGN COLONIC MUCOSA WITH NO SPECIFIC PATHOLOGIC CHANGES       - NEGATIVE FOR INCREASED INTRAEPITHELIAL LYMPHOCYTES OR THICKENED SUBEPITHELIAL       COLLAGEN TABLE       - NEGATIVE FEATURES OF  DYSPLASIA OR MALIGNANCY   CT A/P 01/22/2024 - No acute findings. - Colonic diverticulosis, without radiographic evidence of diverticulitis. - Stable mildly enlarged prostate. - Stable mild hepatic steatosis.   Colonoscopy 12/18/2014 - 5 sessile polyps ranging from 2 to 6 mm in size were found in the transverse colon, ascending colon, rectum, and descending colon; polypectomies were performed with cold forceps and with a cold snare - There was severe diverticulosis noted in the sigmoid colon - The examination was otherwise normal - The colon was redundant - No recall due to age.   EGD 12/18/2014 - Ring in distal esophagus - 4 to 5 cm sliding hiatal hernia - Otherwise normal, no Barrett's seen - Successful 18 mm balloon dilation of ring with slight heme as expected     Past Medical History:  Diagnosis Date   Anxiety    Arthritis    KNEES,ANKLES,HANDS   Asthma    AS A TEENAGER   Barrett's esophagus    Benign essential tremor    Clotting disorder    FACTOR 5   Colitis in 20's   Colon polyps    Congenital deficiency  of other clotting factors    factor 5   Depression 2006   drug overdose   Diverticulosis    DVT (deep venous thrombosis) (HCC) 1991 & 2010   X 2 in context Factor 5 def   Esophageal reflux    Factor 5 Leiden mutation, heterozygous    Gilbert's syndrome    H/O ischemic bowel disease 2010   while off warfarin    Herpes zoster 2010   facial   Hiatal hernia    Hx of adenomatous colonic polyps 12/14/2014   Hyperlipidemia    Hypertension    Insomnia    Osteoporosis    Personal history of colonic polyps 05/28/2007   adenomatous   Proteinuria age 75   Sciatica    Thrombosis of mesenteric vein 07/12/2013   03/2009   Umbilical hernia     Past Surgical History:  Procedure Laterality Date   back injection  2025   lumbar steriod injection   CHOLECYSTECTOMY  05/16/2011    Dr Curvin  COLONOSCOPY     Dr Jakie   CYSTOSCOPY     Neg   EYE SURGERY Bilateral 10/16/2017   secondary catarct surgery.   KNEE ARTHROSCOPY Bilateral 2008, 2012   Dr.Aplington bilateral   TONSILLECTOMY     TRANSURETHRAL RESECTION OF PROSTATE      BPH;Dr Dahlstedt    Current Outpatient Medications  Medication Sig Dispense Refill   acetaminophen  (TYLENOL ) 500 MG tablet Take 1,000 mg by mouth 2 (two) times daily as needed for moderate pain, fever or headache.     apixaban  (ELIQUIS ) 5 MG TABS tablet Take 1 tablet (5 mg total) by mouth 2 (two) times daily. 60 tablet 3   cholecalciferol (VITAMIN D3) 25 MCG (1000 UNIT) tablet Take 1,000 Units by mouth daily.     colestipol  (COLESTID ) 1 g tablet Take 2 tablets (2 g total) by mouth daily with lunch. 60 tablet 3   diazepam  (VALIUM ) 5 MG tablet TAKE 1/2 TO 1 TABLET BY MOUTH AT BEDTIME AS NEEDED FOR SLEEP 30 tablet 2   finasteride  (PROSCAR ) 5 MG tablet Take 1 tablet (5 mg total) by mouth daily. 90 tablet 3   folic acid (FOLVITE) 400 MCG tablet Take 400 mcg by mouth daily.     ipratropium (ATROVENT ) 0.03 % nasal spray Place 2 sprays into both nostrils daily as  needed.     losartan -hydrochlorothiazide  (HYZAAR) 100-25 MG tablet Take 1 tablet by mouth daily. 90 tablet 3   Omega-3 Fatty Acids (FISH OIL PO) Take 1,400 mg by mouth daily.     omeprazole  (PRILOSEC) 40 MG capsule TAKE 1 CAPSULE BY MOUTH DAILY AS NEEDED. 90 capsule 0   potassium chloride  SA (KLOR-CON  M) 20 MEQ tablet Take 1 tablet (20 mEq total) by mouth every other day. 45 tablet 3   primidone  (MYSOLINE ) 50 MG tablet Take 2 tablets (100 mg total) by mouth at bedtime. 180 tablet 1   Probiotic Product (PROBIOTIC DAILY PO) Take 1 capsule by mouth every morning.     propranolol  ER (INDERAL  LA) 120 MG 24 hr capsule Take 1 capsule (120 mg total) by mouth daily. 90 capsule 3   simvastatin  (ZOCOR ) 10 MG tablet Take 1 tablet (10 mg total) by mouth daily. 90 tablet 3   No current facility-administered medications for this visit.    Allergies as of 07/19/2024 - Review Complete 07/19/2024  Allergen Reaction Noted   Amlodipine besy-benazepril hcl Other (See Comments)    Lipitor [atorvastatin calcium] Other (See Comments) 03/21/2011    Family History  Problem Relation Age of Onset   Pancreatic cancer Father 61   Ulcers Father    Stroke Father 69   Diabetes Father    Coronary artery disease Mother    Osteoporosis Mother    HIV Brother    Multiple sclerosis Sister    Heart attack Paternal Grandfather        early 78s   Heart attack Paternal Uncle        2 uncles in early 25s   Aortic aneurysm Brother    Colon cancer Neg Hx    Esophageal cancer Neg Hx    Stomach cancer Neg Hx     Social History   Tobacco Use   Smoking status: Former    Current packs/day: 0.00    Average packs/day: 2.0 packs/day for 40.0 years (80.0 ttl pk-yrs)    Types: Cigarettes    Start date: 08/18/1954    Quit date: 08/18/1994    Years since quitting: 29.9  Smokeless tobacco: Never   Tobacco comments:    smoked 1956-1996, up to 2 ppd  Vaping Use   Vaping status: Never Used  Substance Use Topics   Alcohol use:  Yes    Alcohol/week: 3.0 standard drinks of alcohol    Types: 2 Glasses of wine, 1 Cans of beer per week    Comment: 1 beer per week,. 2 glasses of wine 2 days a week   Drug use: No     Review of Systems:    Gastrointestinal: See HPI and otherwise negative    Physical Exam:  Vital signs: BP 122/64   Pulse 64   Ht 5' 10 (1.778 m)   Wt 193 lb 6 oz (87.7 kg)   SpO2 99%   BMI 27.75 kg/m   Constitutional: Pleasant, well-appearing male in NAD, alert and cooperative Head:  Normocephalic and atraumatic.  Eyes: No scleral icterus.  Respiratory: Respirations even and unlabored. Lungs clear to auscultation bilaterally.  No wheezes, crackles, or rhonchi.  Cardiovascular:  Regular rate and rhythm. No murmurs. No peripheral edema. Gastrointestinal:  Soft, nondistended, nontender. No rebound or guarding. Normal bowel sounds. No appreciable masses or hepatomegaly. Rectal:  Not performed.  Neurologic:  Alert and oriented x4;  grossly normal neurologically.  Skin:   Dry and intact without significant lesions or rashes. Psychiatric: Oriented to person, place and time. Demonstrates good judgement and reason without abnormal affect or behaviors.   RELEVANT LABS AND IMAGING: CBC    Component Value Date/Time   WBC 6.7 04/04/2024 1553   RBC 4.32 04/04/2024 1553   HGB 13.9 04/04/2024 1553   HGB 15.0 07/11/2013 1504   HCT 40.1 04/04/2024 1553   HCT 44.0 07/11/2013 1504   PLT 156.0 04/04/2024 1553   PLT 138 (L) 07/11/2013 1504   MCV 92.9 04/04/2024 1553   MCV 95.9 07/11/2013 1504   MCH 33.2 11/22/2019 0921   MCHC 34.6 04/04/2024 1553   RDW 13.9 04/04/2024 1553   RDW 13.4 07/11/2013 1504   LYMPHSABS 2.4 04/04/2024 1553   LYMPHSABS 2.6 07/11/2013 1504   MONOABS 0.6 04/04/2024 1553   MONOABS 0.6 07/11/2013 1504   EOSABS 0.1 04/04/2024 1553   EOSABS 0.1 07/11/2013 1504   BASOSABS 0.0 04/04/2024 1553   BASOSABS 0.1 07/11/2013 1504    CMP     Component Value Date/Time   NA 138  04/04/2024 1553   K 3.9 04/04/2024 1553   CL 100 04/04/2024 1553   CO2 28 04/04/2024 1553   GLUCOSE 90 04/04/2024 1553   BUN 14 04/04/2024 1553   CREATININE 0.90 04/04/2024 1553   CREATININE 0.96 04/03/2023 1442   CALCIUM 8.7 04/04/2024 1553   PROT 7.2 04/04/2024 1553   ALBUMIN 4.3 04/04/2024 1553   AST 16 04/04/2024 1553   ALT 15 04/04/2024 1553   ALKPHOS 66 04/04/2024 1553   BILITOT 1.4 (H) 04/04/2024 1553   GFRNONAA >60 11/22/2019 0921   GFRAA >60 11/22/2019 9078     Assessment/Plan:   Assessment & Plan Chronic diarrhea Patient seen today for follow-up of diarrhea.  Had extensive workup including negative [name], failed trial of metronidazole  for SIBO, colonoscopy with biopsies negative for microscopic colitis.  Since starting colestipol  2 tablets daily has had normalization of bowel habits.  Having a bowel movement daily or every other day without hard stools or straining, no further loose stools.  Denies any abdominal pain.  Doing well and happy with medication, feels he is now able to leave the house without  fear of diarrhea. He is taking medication as prescribed, apart from other meds.  - Continue colestipol  two tablets once daily at lunch, apart from other medications. - Adjust colestipol  dose if constipation occurs. - Schedule follow-up in a few months to monitor.   Camie Furbish, PA-C [facility] 07/19/2024, 11:32 AM  Patient Care Team: Copland, Harlene BROCKS, MD as PCP - General (Family Medicine) Heide Ingle, MD as Consulting Physician (Orthopedic Surgery) Avram Lupita BRAVO, MD as Consulting Physician (Gastroenterology) Mannam, Praveen, MD as Consulting Physician (Pulmonary Disease) [place], [facility]

## 2024-07-19 NOTE — Patient Instructions (Addendum)
 Continue Colestipol  as directed.   Call office if symptoms worsening.   Follow-up in 3 months. Office will contact you to schedule at a later time.   Due to recent changes in healthcare laws, you may see the results of your imaging and laboratory studies on MyChart before your provider has had a chance to review them.  We understand that in some cases there may be results that are confusing or concerning to you. Not all laboratory results come back in the same time frame and the provider may be waiting for multiple results in order to interpret others.  Please give us  48 hours in order for your provider to thoroughly review all the results before contacting the office for clarification of your results.   _______________________________________________________  If your blood pressure at your visit was 140/90 or greater, please contact your primary care physician to follow up on this.  _______________________________________________________  If you are age 87 or older, your body mass index should be between 23-30. Your Body mass index is 27.75 kg/m. If this is out of the aforementioned range listed, please consider follow up with your Primary Care Provider.  If you are age 67 or younger, your body mass index should be between 19-25. Your Body mass index is 27.75 kg/m. If this is out of the aformentioned range listed, please consider follow up with your Primary Care Provider.   ________________________________________________________  The [facility] providers would like to encourage you to use MYCHART to communicate with providers for non-urgent requests or questions.  Due to long hold times on the telephone, sending your provider a message by [facility] may be a faster and more efficient way to get a response.  Please allow 48 business hours for a response.  Please remember that this is for non-urgent requests.  _______________________________________________________  [facility] is  using a team-based approach to care.  Your team is made up of your doctor and two to three APPS. Our APPS (Nurse Practitioners and Physician Assistants) work with your physician to ensure care continuity for you. They are fully qualified to address your health concerns and develop a treatment plan. They communicate directly with your gastroenterologist to care for you. Seeing the Advanced Practice Practitioners on your physician's team can help you by facilitating care more promptly, often allowing for earlier appointments, access to diagnostic testing, procedures, and other specialty referrals.   Thank you for choosing me and [facility]. Camie Furbish, PA-C

## 2024-08-20 ENCOUNTER — Encounter: Payer: Self-pay | Admitting: Family Medicine

## 2024-09-18 ENCOUNTER — Other Ambulatory Visit: Payer: Self-pay | Admitting: Family Medicine

## 2024-09-18 DIAGNOSIS — M62838 Other muscle spasm: Secondary | ICD-10-CM

## 2024-11-07 ENCOUNTER — Ambulatory Visit: Admitting: Family Medicine

## 2025-07-18 ENCOUNTER — Ambulatory Visit
